# Patient Record
Sex: Male | Born: 1935 | Race: White | Hispanic: No | Marital: Married | Smoking: Former smoker
Health system: Southern US, Community
[De-identification: ages and names within clinical notes are randomized; demographics above are authoritative.]

## PROBLEM LIST (undated history)

## (undated) DIAGNOSIS — D649 Anemia, unspecified: Secondary | ICD-10-CM

## (undated) DIAGNOSIS — G629 Polyneuropathy, unspecified: Secondary | ICD-10-CM

## (undated) DIAGNOSIS — C189 Malignant neoplasm of colon, unspecified: Secondary | ICD-10-CM

## (undated) DIAGNOSIS — K219 Gastro-esophageal reflux disease without esophagitis: Secondary | ICD-10-CM

## (undated) DIAGNOSIS — N189 Chronic kidney disease, unspecified: Secondary | ICD-10-CM

## (undated) DIAGNOSIS — Z8489 Family history of other specified conditions: Secondary | ICD-10-CM

## (undated) DIAGNOSIS — E119 Type 2 diabetes mellitus without complications: Secondary | ICD-10-CM

## (undated) DIAGNOSIS — D509 Iron deficiency anemia, unspecified: Secondary | ICD-10-CM

## (undated) DIAGNOSIS — I10 Essential (primary) hypertension: Secondary | ICD-10-CM

## (undated) DIAGNOSIS — Z8719 Personal history of other diseases of the digestive system: Secondary | ICD-10-CM

## (undated) DIAGNOSIS — R7303 Prediabetes: Secondary | ICD-10-CM

## (undated) DIAGNOSIS — E039 Hypothyroidism, unspecified: Secondary | ICD-10-CM

## (undated) DIAGNOSIS — M1990 Unspecified osteoarthritis, unspecified site: Secondary | ICD-10-CM

## (undated) HISTORY — PX: UPPER GASTROINTESTINAL ENDOSCOPY: SHX188

## (undated) HISTORY — DX: Chronic kidney disease, unspecified: N18.9

## (undated) HISTORY — DX: Anemia, unspecified: D64.9

## (undated) HISTORY — PX: NO PAST SURGERIES: SHX2092

## (undated) HISTORY — PX: CATARACT EXTRACTION: SUR2

## (undated) HISTORY — DX: Gastro-esophageal reflux disease without esophagitis: K21.9

---

## 2005-03-16 ENCOUNTER — Ambulatory Visit: Payer: Self-pay | Admitting: Gastroenterology

## 2005-03-29 ENCOUNTER — Ambulatory Visit: Payer: Self-pay | Admitting: Gastroenterology

## 2008-06-14 ENCOUNTER — Encounter: Admission: RE | Admit: 2008-06-14 | Discharge: 2008-06-14 | Payer: Self-pay | Admitting: Endocrinology

## 2008-06-21 ENCOUNTER — Encounter: Admission: RE | Admit: 2008-06-21 | Discharge: 2008-06-21 | Payer: Self-pay | Admitting: Endocrinology

## 2008-07-01 ENCOUNTER — Ambulatory Visit: Admission: RE | Admit: 2008-07-01 | Discharge: 2008-07-01 | Payer: Self-pay | Admitting: Orthopedic Surgery

## 2008-07-01 ENCOUNTER — Ambulatory Visit: Payer: Self-pay | Admitting: Vascular Surgery

## 2008-07-01 ENCOUNTER — Encounter (INDEPENDENT_AMBULATORY_CARE_PROVIDER_SITE_OTHER): Payer: Self-pay | Admitting: Orthopedic Surgery

## 2008-10-06 ENCOUNTER — Observation Stay (HOSPITAL_COMMUNITY): Admission: EM | Admit: 2008-10-06 | Discharge: 2008-10-07 | Payer: Self-pay | Admitting: Emergency Medicine

## 2008-10-07 ENCOUNTER — Ambulatory Visit: Payer: Self-pay | Admitting: *Deleted

## 2008-10-07 ENCOUNTER — Encounter (INDEPENDENT_AMBULATORY_CARE_PROVIDER_SITE_OTHER): Payer: Self-pay | Admitting: Internal Medicine

## 2010-04-02 ENCOUNTER — Encounter: Payer: Self-pay | Admitting: Gastroenterology

## 2010-04-09 ENCOUNTER — Encounter (INDEPENDENT_AMBULATORY_CARE_PROVIDER_SITE_OTHER): Payer: Self-pay | Admitting: *Deleted

## 2010-04-23 NOTE — Letter (Signed)
Summary: Pre Visit Letter Revised  [facility]  [address] [place], [place] [address]   Phone: [phone]  Fax: [phone]        04/09/2010 MRN: [id number] [facility] [address] [place], [place]  [address]             Procedure Date:  05/14/2010 @ 8:00   Recall colon-Dr. Arlyce Dice   Welcome to the Gastroenterology Division at [facility].    You are scheduled to see a nurse for your pre-procedure visit on 04/30/2010 at 8:30 on the 3rd floor at [facility], [address]  We ask that you try to arrive at our office 15 minutes prior to your appointment time to allow for check-in.  Please take a minute to review the attached form.  If you answer "Yes" to one or more of the questions on the first page, we ask that you call the person listed at your earliest opportunity.  If you answer "No" to all of the questions, please complete the rest of the form and bring it to your appointment.    Your nurse visit will consist of discussing your medical and surgical history, your immediate family medical history, and your medications.   If you are unable to list all of your medications on the form, please bring the medication bottles to your appointment and we will list them.  We will need to be aware of both prescribed and over the counter drugs.  We will need to know exact dosage information as well.    Please be prepared to read and sign documents such as consent forms, a financial agreement, and acknowledgement forms.  If necessary, and with your consent, a friend or relative is welcome to sit-in on the nurse visit with you.  Please bring your insurance card so that we may make a copy of it.  If your insurance requires a referral to see a specialist, please bring your referral form from your primary care physician.  No co-pay is required for this nurse visit.     If you cannot keep your appointment, please call [phone] to cancel or reschedule prior to  your appointment date.  This allows [place] the opportunity to schedule an appointment for another patient in need of care.    Thank you for choosing [facility] for your medical needs.  We appreciate the opportunity to care for you.  Please visit [place] at our website  to learn more about our practice.  Sincerely, The Gastroenterology Division

## 2010-04-23 NOTE — Letter (Signed)
Summary: Colonoscopy Letter  [facility]  [address] [place], [place] [address]   Phone: [phone]  Fax: [phone]      April 02, 2010 MRN: [id number]   Joseph Pennington [address] [place], [place]  [address]   Dear Joseph Pennington,   According to your medical record, it is time for you to schedule a Colonoscopy. The American Cancer Society recommends this procedure as a method to detect early colon cancer. Patients with a family history of colon cancer, or a personal history of colon polyps or inflammatory bowel disease are at increased risk.  This letter has been generated based on the recommendations made at the time of your procedure. If you feel that in your particular situation this may no longer apply, please contact our office.  Please call our office at [phone] to schedule this appointment or to update your records at your earliest convenience.  Thank you for cooperating with [place] to provide you with the very best care possible.   Sincerely,  Joseph Pennington, M.D.  [facility] Gastroenterology Division [phone]

## 2010-04-29 ENCOUNTER — Encounter (INDEPENDENT_AMBULATORY_CARE_PROVIDER_SITE_OTHER): Payer: Self-pay

## 2010-04-30 ENCOUNTER — Encounter: Payer: Self-pay | Admitting: Gastroenterology

## 2010-05-07 NOTE — Miscellaneous (Signed)
Summary: [facility]  Clinical Lists Changes  Medications: Added new medication of MOVIPREP 100 GM  SOLR (PEG-KCL-NACL-NASULF-NA ASC-C) As per prep instructions. - Signed Rx of MOVIPREP 100 GM  SOLR (PEG-KCL-NACL-NASULF-NA ASC-C) As per prep instructions.;  #1 x 0;  Signed;  Entered by: Doristine Church RN II;  Authorized by: Louis Meckel MD;  Method used: Electronically to [facility], [place], [place], [place]  [address], Ph: [phone], Fax: [phone] Observations: Added new observation of ALLERGY REV: Done (04/30/2010 8:18) Added new observation of NKA: T (04/30/2010 8:18)    Prescriptions: MOVIPREP 100 GM  SOLR (PEG-KCL-NACL-NASULF-NA ASC-C) As per prep instructions.  #1 x 0   Entered by:   Doristine Church RN II   Authorized by:   Louis Meckel MD   Signed by:   Doristine Church RN II on 04/30/2010   Method used:   Electronically to        [facility] (retail)       [address]       [place], [place]  [address]       Ph: [phone]       Fax: [phone]   RxID:   [id number]

## 2010-05-07 NOTE — Letter (Signed)
Summary: [facility] Instructions  [facility]  [address] [place], [place] [address]   Phone: [phone]  Fax: [phone]       Joseph Pennington    1931-03-29    MRN: [id number]        Procedure Day Dorna Bloom:  Lenor Coffin  05/14/10     Arrival Time:  7:30AM     Procedure Time:  8:00AM     Location of Procedure:                    Juliann Pares _  [facility] (4th Floor)                      PREPARATION FOR COLONOSCOPY WITH MOVIPREP   Starting 5 days prior to your procedure 05/09/10 do not eat nuts, seeds, popcorn, corn, beans, peas,  salads, or any raw vegetables.  Do not take any fiber supplements (e.g. Metamucil, Citrucel, and Benefiber).  THE DAY BEFORE YOUR PROCEDURE         DATE: 05/13/10  DAY: WEDNESDAY  1.  Drink clear liquids the entire day-NO SOLID FOOD  2.  Do not drink anything colored red or purple.  Avoid juices with pulp.  No orange juice.  3.  Drink at least 64 oz. (8 glasses) of fluid/clear liquids during the day to prevent dehydration and help the prep work efficiently.  CLEAR LIQUIDS INCLUDE: Water Jello Ice Popsicles Tea (sugar ok, no milk/cream) Powdered fruit flavored drinks Coffee (sugar ok, no milk/cream) Gatorade Juice: apple, white grape, white cranberry  Lemonade Clear bullion, consomm, broth Carbonated beverages (any kind) Strained chicken noodle soup Hard Candy                             4.  In the morning, mix first dose of MoviPrep solution:    Empty 1 Pouch A and 1 Pouch B into the disposable container    Add lukewarm drinking water to the top line of the container. Mix to dissolve    Refrigerate (mixed solution should be used within 24 hrs)  5.  Begin drinking the prep at 5:00 p.m. The MoviPrep container is divided by 4 marks.   Every 15 minutes drink the solution down to the next mark (approximately 8 oz) until the full liter is complete.   6.  Follow completed prep with 16 oz of clear liquid of your choice  (Nothing red or purple).  Continue to drink clear liquids until bedtime.  7.  Before going to bed, mix second dose of MoviPrep solution:    Empty 1 Pouch A and 1 Pouch B into the disposable container    Add lukewarm drinking water to the top line of the container. Mix to dissolve    Refrigerate  THE DAY OF YOUR PROCEDURE      DATE: 05/14/10   DAY: THURSDAY  Beginning at 3:00AM (5 hours before procedure):         1. Every 15 minutes, drink the solution down to the next mark (approx 8 oz) until the full liter is complete.  2. Follow completed prep with 16 oz. of clear liquid of your choice.    3. You may drink clear liquids until 6:00AM (2 HOURS BEFORE PROCEDURE).   MEDICATION INSTRUCTIONS  Unless otherwise instructed, you should take regular prescription medications with a small sip of water   as early as possible the morning of  your procedure.        OTHER INSTRUCTIONS  You will need a responsible adult at least 75 years of age to accompany you and drive you home.   This person must remain in the waiting room during your procedure.  Wear loose fitting clothing that is easily removed.  Leave jewelry and other valuables at home.  However, you may wish to bring a book to read or  an iPod/MP3 player to listen to music as you wait for your procedure to start.  Remove all body piercing jewelry and leave at home.  Total time from sign-in until discharge is approximately 2-3 hours.  You should go home directly after your procedure and rest.  You can resume normal activities the  day after your procedure.  The day of your procedure you should not:   Drive   Make legal decisions   Operate machinery   Drink alcohol   Return to work  You will receive specific instructions about eating, activities and medications before you leave.    The above instructions have been reviewed and explained to me by   Doristine Church RN II  April 30, 2010 8:57 AM     I fully  understand and can verbalize these instructions _____________________________ Date _________

## 2010-05-14 ENCOUNTER — Other Ambulatory Visit: Payer: Self-pay | Admitting: Gastroenterology

## 2010-05-14 ENCOUNTER — Other Ambulatory Visit (AMBULATORY_SURGERY_CENTER): Payer: Medicare Other | Admitting: Gastroenterology

## 2010-05-14 DIAGNOSIS — Z1211 Encounter for screening for malignant neoplasm of colon: Secondary | ICD-10-CM

## 2010-05-14 DIAGNOSIS — D126 Benign neoplasm of colon, unspecified: Secondary | ICD-10-CM

## 2010-05-14 DIAGNOSIS — Z86010 Personal history of colonic polyps: Secondary | ICD-10-CM

## 2010-05-19 ENCOUNTER — Encounter: Payer: Self-pay | Admitting: Gastroenterology

## 2010-05-19 NOTE — Procedures (Addendum)
Summary: Colonoscopy  Patient: Joseph Pennington Note: All result statuses are Final unless otherwise noted.  Tests: (1) Colonoscopy (COL)   COL Colonoscopy           DONE     [facility]     [address]     [place], [place]  [address]           COLONOSCOPY PROCEDURE REPORT           PATIENT:  [name], [name]  MR#:  [id number]     BIRTHDATE:  [birth year], 78 yrs. old  GENDER:  male           ENDOSCOPIST:  Barbette Hair. Arlyce Dice, MD     Referred by:  Holley Bouche, M.D.           PROCEDURE DATE:  05/14/2010     PROCEDURE:  Colonoscopy with snare polypectomy     ASA CLASS:  Class II     INDICATIONS:  1) screening  2) history of pre-cancerous     (adenomatous) colon polyps Index polypectomy 2004; colo 2007     normal           MEDICATIONS:   Fentanyl 50 mcg IV, Versed 5 mg IV           DESCRIPTION OF PROCEDURE:   After the risks benefits and     alternatives of the procedure were thoroughly explained, informed     consent was obtained.  Digital rectal exam was performed and     revealed no abnormalities.   The LB CF-[id number] endoscope     was introduced through the anus and advanced to the cecum, which     was identified by the ileocecal valve, without limitations.  The     quality of the prep was excellent, using MoviPrep.  The instrument     was then slowly withdrawn as the colon was fully examined.     <<PROCEDUREIMAGES>>           FINDINGS:  A sessile polyp was found in the descending colon. It     was 4 mm in size. Polyp was snared without cautery. Retrieval was     successful (see image5). snare polyp    (see image7). 2 2-22mm     polyps at 15 and 20cm from anus - removed with cold polypectomy     snare  This was otherwise a normal examination of the colon (see     image1, image2, image3, and image8).   Retroflexed views in the     rectum revealed no abnormalities.    The time to cecum =  2.25     minutes. The scope was then withdrawn (time =  8.75  min) from  the     patient and the procedure completed.           COMPLICATIONS:  None           ENDOSCOPIC IMPRESSION:     1) 4 mm sessile polyp in the descending colon     2) Two polyps in sigmoid     3) Otherwise normal examination     RECOMMENDATIONS:     1) If the polyp(s) removed today are proven to be adenomatous     (pre-cancerous) polyps, you will need a repeat colonoscopy in 5     years. Otherwise you should continue to follow colorectal cancer     screening guidelines for "routine risk" patients with colonoscopy  in 10 years.           REPEAT EXAM:   You will receive a letter from Dr. Arlyce Dice in 1-2     weeks, after reviewing the final pathology, with followup     recommendations.           ______________________________     Barbette Hair Arlyce Dice, MD           CC:           n.     eSIGNED:   Barbette Hair. Mckinleigh Schuchart at 05/14/2010 08:34 AM           Angelyn Punt, [id number]  Note: An exclamation mark (!) indicates a result that was not dispersed into the flowsheet. Document Creation Date: 05/14/2010 8:34 AM _______________________________________________________________________  (1) Order result status: Final Collection or observation date-time: 05/14/2010 08:25 Requested date-time:  Receipt date-time:  Reported date-time:  Referring Physician:   Ordering Physician: Melvia Heaps [phone]) Specimen Source:  Source: Launa Grill Order Number: [id number] Lab site:   Appended Document: Colonoscopy     Procedures Next Due Date:    Colonoscopy: 04/2020

## 2010-05-28 NOTE — Letter (Signed)
Summary: Patient Notice-Hyperplastic Polyps  [facility]  [address] [place], [place] [address]   Phone: [phone]  Fax: [phone]        May 19, 2010 MRN: [id number]    Joseph Pennington [address] [place], [place]  [address]    Dear Joseph Pennington,  I am pleased to inform you that the colon polyp(s) removed during your recent colonoscopy was (were) found to be hyperplastic.  These types of polyps are NOT pre-cancerous.  It is therefore my recommendation that you have a repeat colonoscopy examination in 10_ years for routine colorectal cancer screening.  Should you develop new or worsening symptoms of abdominal pain, bowel habit changes or bleeding from the rectum or bowels, please schedule an evaluation with either your primary care physician or with me.  Additional information/recommendations:  __No further action with gastroenterology is needed at this time.      Please follow-up with your primary care physician for your other      healthcare needs. __Please call [phone] to schedule a return visit to review      your situation.  __Please keep your follow-up visit as already scheduled.  _x_Continue treatment plan as outlined the day of your exam.  Please call us if you are having persistent problems or have questions about your condition that have not been fully answered at this time.  Sincerely,  Louis Meckel MD This letter has been electronically signed by your physician.  Appended Document: Patient Notice-Hyperplastic Polyps letter mailed

## 2010-06-28 LAB — BASIC METABOLIC PANEL
BUN: 15 mg/dL (ref 6–23)
CO2: 26 mEq/L (ref 19–32)
Calcium: 9.2 mg/dL (ref 8.4–10.5)
Chloride: 109 mEq/L (ref 96–112)
Creatinine, Ser: 1.2 mg/dL (ref 0.4–1.5)
GFR calc Af Amer: >60 mL/min (ref >60)
GFR calc non Af Amer: 59 mL/min — ABNORMAL LOW (ref >60)
Glucose, Bld: 126 mg/dL — ABNORMAL HIGH (ref 70–99)
Potassium: 4.4 mEq/L (ref 3.5–5.1)
Sodium: 141 mEq/L (ref 135–145)

## 2010-06-28 LAB — CBC
HCT: 39.7 % (ref 39.0–52.0)
HCT: 41.8 % (ref 39.0–52.0)
Hemoglobin: 14 g/dL (ref 13.0–17.0)
Hemoglobin: 14.9 g/dL (ref 13.0–17.0)
MCHC: 35.3 g/dL (ref 30.0–36.0)
MCHC: 35.5 g/dL (ref 30.0–36.0)
MCV: 93.5 fL (ref 78.0–100.0)
MCV: 93.6 fL (ref 78.0–100.0)
Platelets: 146 10*3/uL — ABNORMAL LOW (ref 150–400)
Platelets: 147 10*3/uL — ABNORMAL LOW (ref 150–400)
RBC: 4.24 MIL/uL (ref 4.22–5.81)
RBC: 4.47 MIL/uL (ref 4.22–5.81)
RDW: 12.5 % (ref 11.5–15.5)
RDW: 12.9 % (ref 11.5–15.5)
WBC: 6 10*3/uL (ref 4.0–10.5)
WBC: 8.1 10*3/uL (ref 4.0–10.5)

## 2010-06-28 LAB — URINALYSIS, ROUTINE W REFLEX MICROSCOPIC
Bilirubin Urine: NEGATIVE
Glucose, UA: NEGATIVE mg/dL
Hgb urine dipstick: NEGATIVE
Ketones, ur: NEGATIVE mg/dL
Nitrite: NEGATIVE
Protein, ur: NEGATIVE mg/dL
Specific Gravity, Urine: 1.021 (ref 1.005–1.030)
Urobilinogen, UA: 1 mg/dL (ref 0.0–1.0)
pH: 7.5 (ref 5.0–8.0)

## 2010-06-28 LAB — COMPREHENSIVE METABOLIC PANEL
ALT: 40 U/L (ref 0–53)
AST: 35 U/L (ref 0–37)
Albumin: 3.9 g/dL (ref 3.5–5.2)
Alkaline Phosphatase: 78 U/L (ref 39–117)
BUN: 19 mg/dL (ref 6–23)
CO2: 26 mEq/L (ref 19–32)
Calcium: 9.4 mg/dL (ref 8.4–10.5)
Chloride: 104 mEq/L (ref 96–112)
Creatinine, Ser: 1.14 mg/dL (ref 0.4–1.5)
GFR calc Af Amer: >60 mL/min (ref >60)
GFR calc non Af Amer: >60 mL/min (ref >60)
Glucose, Bld: 132 mg/dL — ABNORMAL HIGH (ref 70–99)
Potassium: 4.5 mEq/L (ref 3.5–5.1)
Sodium: 135 mEq/L (ref 135–145)
Total Bilirubin: 0.9 mg/dL (ref 0.3–1.2)
Total Protein: 7.1 g/dL (ref 6.0–8.3)

## 2010-06-28 LAB — CARDIAC PANEL(CRET KIN+CKTOT+MB+TROPI)
CK, MB: 2 ng/mL (ref 0.3–4.0)
CK, MB: 2.3 ng/mL (ref 0.3–4.0)
Relative Index: INVALID (ref 0.0–2.5)
Relative Index: INVALID (ref 0.0–2.5)
Total CK: 91 U/L (ref 7–232)
Total CK: 95 U/L (ref 7–232)
Troponin I: <0.01 ng/mL (ref 0.00–0.06)
Troponin I: <0.01 ng/mL (ref 0.00–0.06)

## 2010-06-28 LAB — POCT CARDIAC MARKERS
CKMB, poc: <1 ng/mL — ABNORMAL LOW (ref 1.0–8.0)
Myoglobin, poc: 100 ng/mL (ref 12–200)
Troponin i, poc: <0.05 ng/mL (ref 0.00–0.09)

## 2010-06-28 LAB — DIFFERENTIAL
Basophils Absolute: 0 10*3/uL (ref 0.0–0.1)
Basophils Relative: 0 % (ref 0–1)
Eosinophils Absolute: 0 10*3/uL (ref 0.0–0.7)
Eosinophils Relative: 1 % (ref 0–5)
Lymphocytes Relative: 13 % (ref 12–46)
Lymphs Abs: 1 10*3/uL (ref 0.7–4.0)
Monocytes Absolute: 0.3 10*3/uL (ref 0.1–1.0)
Monocytes Relative: 4 % (ref 3–12)
Neutro Abs: 6.7 10*3/uL (ref 1.7–7.7)
Neutrophils Relative %: 83 % — ABNORMAL HIGH (ref 43–77)

## 2010-06-28 LAB — CK TOTAL AND CKMB (NOT AT ARMC)
CK, MB: 2.5 ng/mL (ref 0.3–4.0)
Relative Index: 2.4 (ref 0.0–2.5)
Total CK: 103 U/L (ref 7–232)

## 2010-06-28 LAB — TROPONIN I: Troponin I: 0.01 ng/mL (ref 0.00–0.06)

## 2010-08-04 NOTE — H&P (Signed)
NAMEMarland Pennington  LERRY, CORDREY NO.:  [id number]   MEDICAL RECORD NO.:  [id number]          PATIENT TYPE:  INP   LOCATION:  5506                         FACILITY:  [facility]   PHYSICIAN:  Lonia Blood, M.D.       DATE OF BIRTH:  [birth year]   DATE OF ADMISSION:  10/06/2008  DATE OF DISCHARGE:                              HISTORY & PHYSICAL   PRIMARY CARE PHYSICIAN:  Alfonse Alpers. Dagoberto Ligas, MD   CHIEF COMPLAINT:  Passed out.   HISTORY OF PRESENT ILLNESS:  Mr. Joseph Pennington is a 77 year old gentleman with  a history of hypertension who was brought into the emergency room today  after he had an episode of syncope at home.  [name] said that he  has been feeling sort of sick with cold for the past day, eating less  and drinking less fluids.  He also did not have his breakfast this  morning before walking the dog.  He went outside and was staring at the  tree and then when he tried to look down, he went out without being able  to control his fall.  He fell against a brick wall of the house scraping  the skin off of his right ear and right arm.  He went down to the ground  and he said that he probably did not lose consciousness and got up  quickly and came inside the house.  Inside the house he remained  significantly dizzy and unable to function so they summoned up EMS who  found him to be bradycardic into the 50s.  EMS medicated him with  atropine which increased his heart rate.  As he remained significantly  dizzy, he was brought to the emergency room.  In the emergency room, he  was denying any chest pain, vertigo, nausea, vomiting, abdominal pain,  and shortness of breath.   PAST MEDICAL HISTORY:  1. 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  2. Hypertension.  3. Ulnar neuropathy.  4. Impaired glucose tolerance.   HOME MEDICATIONS:  1. Lisinopril 10 mg daily.  2. Atenolol 25 mg daily.  3. Vitamin B12 injection monthly.  4. Vitamin B1 100 mg  daily.  5. Folic acid 1 mg daily.  6. Aspirin 81 mg daily.   SOCIAL HISTORY:  The patient is married, has a remote history of tobacco  use.  He quit in 1980 and prior to that smoked a pack of cigarettes a  day since age 8.  There is no history of alcohol or drugs.   FAMILY HISTORY:  Mother died at age 75 and she had open heart surgery in  her 45s and a brain aneurysm resulting in subarachnoid hemorrhage 2  years before she passed away.  The patient's father died at age 54 with  a massive heart attack.   REVIEW OF SYSTEMS:  As per HPI.  All other systems reviewed and  negative.   PHYSICAL EXAMINATION:  VITAL SIGNS:  Upon admission temperature is 97.5,  heart rate 62, respiratory rate 16, blood pressure 146/85, saturation  100% on room air.  GENERAL:  The patient is a tall, well-built, well-developed and well-  nourished gentleman in no acute distress sitting on the stretcher.  His  right ear is bandaged heavily so I cannot examine it but the patient  reports that the skin is scraped off of it and it is currently not  hurting him.  THROAT:  Clear.  NECK:  Supple.  No JVD.  No carotid bruits.  CHEST:  Clear to auscultation.  No wheezes, rhonchi or crackles.  HEART:  Regular rate and rhythm without murmurs, rubs or gallops.  ABDOMEN:  Soft, nontender, nondistended, bowel sounds are present.  EXTREMITIES:  Lower extremity without edema.  The skin on the right  forearm there is some minor skin tears and excoriations from the fall.  Otherwise, the skin seems to be intact and without suspicious rashes.  NEUROLOGIC:  Cranial nerves II-XII intact, strength 5/5 in all four  extremities.  Sensation intact.   LABORATORY VALUES:  Upon admission white blood cell count is 8.1,  hemoglobin 14.9, platelet count is 146, myoglobin 100, troponin I less  than 0.05.  Sodium 135, potassium 4.5, chloride 104, bicarbonate 26, BUN  19, creatinine 1.1, calcium 9.4, protein 7.1, albumin 3.9, AST 35, ALT   40.  Urinalysis within normal limits.  Chest x-ray PA and lateral no  evidence of acute cardiopulmonary disease.  Head CT no evidence of acute  intracranial abnormality.   ASSESSMENT AND PLAN:  This is a 77 year old gentleman placed on  observation for a probable syncope versus near syncope.  The description  of the event and also the finding of bradycardia upon EMS arrival raises  the possibility of a vasovagal event.  The patient's orthostatics in the  emergency room do not indicate the patient is suffering from orthostatic  hypotension.  My plan is to place [name] on telemetry, check  cardiac enzymes, check transthoracic echocardiogram, carotid ultrasound  and discontinue the atenolol.  The patient will receive intravenous  fluids and further plans of care will be dictated by his course in the  first 23 hours of observation.      Lonia Blood, M.D.  Electronically Signed     SL/MEDQ  D:  10/06/2008  T:  10/07/2008  Job:  [id number]   cc:   Alfonse Alpers. Dagoberto Ligas, M.D.

## 2011-05-14 DIAGNOSIS — H40019 Open angle with borderline findings, low risk, unspecified eye: Secondary | ICD-10-CM | POA: Diagnosis not present

## 2011-07-12 DIAGNOSIS — Z125 Encounter for screening for malignant neoplasm of prostate: Secondary | ICD-10-CM | POA: Diagnosis not present

## 2011-07-12 DIAGNOSIS — R7309 Other abnormal glucose: Secondary | ICD-10-CM | POA: Diagnosis not present

## 2011-07-12 DIAGNOSIS — N183 Chronic kidney disease, stage 3 unspecified: Secondary | ICD-10-CM | POA: Diagnosis not present

## 2011-07-12 DIAGNOSIS — I10 Essential (primary) hypertension: Secondary | ICD-10-CM | POA: Diagnosis not present

## 2011-08-23 DIAGNOSIS — D492 Neoplasm of unspecified behavior of bone, soft tissue, and skin: Secondary | ICD-10-CM | POA: Diagnosis not present

## 2011-08-23 DIAGNOSIS — H40019 Open angle with borderline findings, low risk, unspecified eye: Secondary | ICD-10-CM | POA: Diagnosis not present

## 2011-09-29 DIAGNOSIS — H43399 Other vitreous opacities, unspecified eye: Secondary | ICD-10-CM | POA: Diagnosis not present

## 2011-09-29 DIAGNOSIS — H5210 Myopia, unspecified eye: Secondary | ICD-10-CM | POA: Diagnosis not present

## 2011-09-29 DIAGNOSIS — H524 Presbyopia: Secondary | ICD-10-CM | POA: Diagnosis not present

## 2011-09-29 DIAGNOSIS — IMO0001 Reserved for inherently not codable concepts without codable children: Secondary | ICD-10-CM | POA: Diagnosis not present

## 2011-09-29 DIAGNOSIS — H40019 Open angle with borderline findings, low risk, unspecified eye: Secondary | ICD-10-CM | POA: Diagnosis not present

## 2011-12-24 DIAGNOSIS — H40019 Open angle with borderline findings, low risk, unspecified eye: Secondary | ICD-10-CM | POA: Diagnosis not present

## 2011-12-24 DIAGNOSIS — D492 Neoplasm of unspecified behavior of bone, soft tissue, and skin: Secondary | ICD-10-CM | POA: Diagnosis not present

## 2012-01-06 DIAGNOSIS — Z23 Encounter for immunization: Secondary | ICD-10-CM | POA: Diagnosis not present

## 2012-01-11 DIAGNOSIS — R7309 Other abnormal glucose: Secondary | ICD-10-CM | POA: Diagnosis not present

## 2012-01-11 DIAGNOSIS — I10 Essential (primary) hypertension: Secondary | ICD-10-CM | POA: Diagnosis not present

## 2012-01-11 DIAGNOSIS — N183 Chronic kidney disease, stage 3 unspecified: Secondary | ICD-10-CM | POA: Diagnosis not present

## 2012-07-13 DIAGNOSIS — I10 Essential (primary) hypertension: Secondary | ICD-10-CM | POA: Diagnosis not present

## 2012-07-13 DIAGNOSIS — N183 Chronic kidney disease, stage 3 unspecified: Secondary | ICD-10-CM | POA: Diagnosis not present

## 2012-07-13 DIAGNOSIS — Z0000 Encounter for general adult medical examination without abnormal findings: Secondary | ICD-10-CM | POA: Diagnosis not present

## 2012-07-13 DIAGNOSIS — Z1211 Encounter for screening for malignant neoplasm of colon: Secondary | ICD-10-CM | POA: Diagnosis not present

## 2012-07-13 DIAGNOSIS — Z125 Encounter for screening for malignant neoplasm of prostate: Secondary | ICD-10-CM | POA: Diagnosis not present

## 2012-07-13 DIAGNOSIS — Z23 Encounter for immunization: Secondary | ICD-10-CM | POA: Diagnosis not present

## 2012-07-13 DIAGNOSIS — R7309 Other abnormal glucose: Secondary | ICD-10-CM | POA: Diagnosis not present

## 2012-07-13 DIAGNOSIS — Z1331 Encounter for screening for depression: Secondary | ICD-10-CM | POA: Diagnosis not present

## 2012-11-28 DIAGNOSIS — H43819 Vitreous degeneration, unspecified eye: Secondary | ICD-10-CM | POA: Diagnosis not present

## 2012-11-28 DIAGNOSIS — H43399 Other vitreous opacities, unspecified eye: Secondary | ICD-10-CM | POA: Diagnosis not present

## 2012-11-28 DIAGNOSIS — H2510 Age-related nuclear cataract, unspecified eye: Secondary | ICD-10-CM | POA: Diagnosis not present

## 2012-11-28 DIAGNOSIS — H40019 Open angle with borderline findings, low risk, unspecified eye: Secondary | ICD-10-CM | POA: Diagnosis not present

## 2012-11-28 DIAGNOSIS — H01009 Unspecified blepharitis unspecified eye, unspecified eyelid: Secondary | ICD-10-CM | POA: Diagnosis not present

## 2012-11-28 DIAGNOSIS — D492 Neoplasm of unspecified behavior of bone, soft tissue, and skin: Secondary | ICD-10-CM | POA: Diagnosis not present

## 2012-11-28 DIAGNOSIS — E119 Type 2 diabetes mellitus without complications: Secondary | ICD-10-CM | POA: Diagnosis not present

## 2013-01-02 DIAGNOSIS — Z23 Encounter for immunization: Secondary | ICD-10-CM | POA: Diagnosis not present

## 2013-01-12 DIAGNOSIS — I10 Essential (primary) hypertension: Secondary | ICD-10-CM | POA: Diagnosis not present

## 2013-01-12 DIAGNOSIS — E538 Deficiency of other specified B group vitamins: Secondary | ICD-10-CM | POA: Diagnosis not present

## 2013-01-12 DIAGNOSIS — R7309 Other abnormal glucose: Secondary | ICD-10-CM | POA: Diagnosis not present

## 2013-01-12 DIAGNOSIS — N183 Chronic kidney disease, stage 3 unspecified: Secondary | ICD-10-CM | POA: Diagnosis not present

## 2013-07-13 DIAGNOSIS — N183 Chronic kidney disease, stage 3 unspecified: Secondary | ICD-10-CM | POA: Diagnosis not present

## 2013-07-13 DIAGNOSIS — Z125 Encounter for screening for malignant neoplasm of prostate: Secondary | ICD-10-CM | POA: Diagnosis not present

## 2013-07-13 DIAGNOSIS — Z23 Encounter for immunization: Secondary | ICD-10-CM | POA: Diagnosis not present

## 2013-07-13 DIAGNOSIS — I10 Essential (primary) hypertension: Secondary | ICD-10-CM | POA: Diagnosis not present

## 2013-07-13 DIAGNOSIS — Z0000 Encounter for general adult medical examination without abnormal findings: Secondary | ICD-10-CM | POA: Diagnosis not present

## 2013-07-13 DIAGNOSIS — R7309 Other abnormal glucose: Secondary | ICD-10-CM | POA: Diagnosis not present

## 2014-01-08 DIAGNOSIS — Z23 Encounter for immunization: Secondary | ICD-10-CM | POA: Diagnosis not present

## 2014-01-14 DIAGNOSIS — I10 Essential (primary) hypertension: Secondary | ICD-10-CM | POA: Diagnosis not present

## 2014-01-14 DIAGNOSIS — N183 Chronic kidney disease, stage 3 (moderate): Secondary | ICD-10-CM | POA: Diagnosis not present

## 2014-01-14 DIAGNOSIS — R739 Hyperglycemia, unspecified: Secondary | ICD-10-CM | POA: Diagnosis not present

## 2014-01-14 DIAGNOSIS — E538 Deficiency of other specified B group vitamins: Secondary | ICD-10-CM | POA: Diagnosis not present

## 2014-07-16 DIAGNOSIS — R739 Hyperglycemia, unspecified: Secondary | ICD-10-CM | POA: Diagnosis not present

## 2014-07-16 DIAGNOSIS — W57XXXA Bitten or stung by nonvenomous insect and other nonvenomous arthropods, initial encounter: Secondary | ICD-10-CM | POA: Diagnosis not present

## 2014-07-16 DIAGNOSIS — N183 Chronic kidney disease, stage 3 (moderate): Secondary | ICD-10-CM | POA: Diagnosis not present

## 2014-07-16 DIAGNOSIS — H8113 Benign paroxysmal vertigo, bilateral: Secondary | ICD-10-CM | POA: Diagnosis not present

## 2014-07-16 DIAGNOSIS — I10 Essential (primary) hypertension: Secondary | ICD-10-CM | POA: Diagnosis not present

## 2014-12-27 DIAGNOSIS — Z23 Encounter for immunization: Secondary | ICD-10-CM | POA: Diagnosis not present

## 2015-01-16 DIAGNOSIS — I10 Essential (primary) hypertension: Secondary | ICD-10-CM | POA: Diagnosis not present

## 2015-01-16 DIAGNOSIS — R739 Hyperglycemia, unspecified: Secondary | ICD-10-CM | POA: Diagnosis not present

## 2015-01-16 DIAGNOSIS — N183 Chronic kidney disease, stage 3 (moderate): Secondary | ICD-10-CM | POA: Diagnosis not present

## 2015-01-30 DIAGNOSIS — H2513 Age-related nuclear cataract, bilateral: Secondary | ICD-10-CM | POA: Diagnosis not present

## 2015-01-30 DIAGNOSIS — H40013 Open angle with borderline findings, low risk, bilateral: Secondary | ICD-10-CM | POA: Diagnosis not present

## 2015-01-30 DIAGNOSIS — E119 Type 2 diabetes mellitus without complications: Secondary | ICD-10-CM | POA: Diagnosis not present

## 2015-01-30 DIAGNOSIS — H01003 Unspecified blepharitis right eye, unspecified eyelid: Secondary | ICD-10-CM | POA: Diagnosis not present

## 2015-05-07 DIAGNOSIS — H40013 Open angle with borderline findings, low risk, bilateral: Secondary | ICD-10-CM | POA: Diagnosis not present

## 2015-05-07 DIAGNOSIS — H16143 Punctate keratitis, bilateral: Secondary | ICD-10-CM | POA: Diagnosis not present

## 2015-07-17 DIAGNOSIS — I10 Essential (primary) hypertension: Secondary | ICD-10-CM | POA: Diagnosis not present

## 2016-01-02 DIAGNOSIS — I10 Essential (primary) hypertension: Secondary | ICD-10-CM | POA: Diagnosis not present

## 2016-01-02 DIAGNOSIS — Z1211 Encounter for screening for malignant neoplasm of colon: Secondary | ICD-10-CM | POA: Diagnosis not present

## 2016-01-02 DIAGNOSIS — Z0000 Encounter for general adult medical examination without abnormal findings: Secondary | ICD-10-CM | POA: Diagnosis not present

## 2016-01-02 DIAGNOSIS — N183 Chronic kidney disease, stage 3 (moderate): Secondary | ICD-10-CM | POA: Diagnosis not present

## 2016-01-02 DIAGNOSIS — Z23 Encounter for immunization: Secondary | ICD-10-CM | POA: Diagnosis not present

## 2016-01-02 DIAGNOSIS — R7303 Prediabetes: Secondary | ICD-10-CM | POA: Diagnosis not present

## 2016-01-02 DIAGNOSIS — E538 Deficiency of other specified B group vitamins: Secondary | ICD-10-CM | POA: Diagnosis not present

## 2016-01-15 DIAGNOSIS — Z1211 Encounter for screening for malignant neoplasm of colon: Secondary | ICD-10-CM | POA: Diagnosis not present

## 2016-02-03 DIAGNOSIS — H25013 Cortical age-related cataract, bilateral: Secondary | ICD-10-CM | POA: Diagnosis not present

## 2016-02-03 DIAGNOSIS — H2513 Age-related nuclear cataract, bilateral: Secondary | ICD-10-CM | POA: Diagnosis not present

## 2016-02-03 DIAGNOSIS — H40013 Open angle with borderline findings, low risk, bilateral: Secondary | ICD-10-CM | POA: Diagnosis not present

## 2016-07-02 DIAGNOSIS — E538 Deficiency of other specified B group vitamins: Secondary | ICD-10-CM | POA: Diagnosis not present

## 2016-07-02 DIAGNOSIS — R7303 Prediabetes: Secondary | ICD-10-CM | POA: Diagnosis not present

## 2016-07-02 DIAGNOSIS — I10 Essential (primary) hypertension: Secondary | ICD-10-CM | POA: Diagnosis not present

## 2016-07-02 DIAGNOSIS — N183 Chronic kidney disease, stage 3 (moderate): Secondary | ICD-10-CM | POA: Diagnosis not present

## 2016-07-28 DIAGNOSIS — H40013 Open angle with borderline findings, low risk, bilateral: Secondary | ICD-10-CM | POA: Diagnosis not present

## 2017-01-03 DIAGNOSIS — E538 Deficiency of other specified B group vitamins: Secondary | ICD-10-CM | POA: Diagnosis not present

## 2017-01-03 DIAGNOSIS — I10 Essential (primary) hypertension: Secondary | ICD-10-CM | POA: Diagnosis not present

## 2017-01-03 DIAGNOSIS — R7303 Prediabetes: Secondary | ICD-10-CM | POA: Diagnosis not present

## 2017-01-03 DIAGNOSIS — Z0000 Encounter for general adult medical examination without abnormal findings: Secondary | ICD-10-CM | POA: Diagnosis not present

## 2017-01-03 DIAGNOSIS — Z23 Encounter for immunization: Secondary | ICD-10-CM | POA: Diagnosis not present

## 2017-01-03 DIAGNOSIS — N183 Chronic kidney disease, stage 3 (moderate): Secondary | ICD-10-CM | POA: Diagnosis not present

## 2017-02-07 DIAGNOSIS — H40023 Open angle with borderline findings, high risk, bilateral: Secondary | ICD-10-CM | POA: Diagnosis not present

## 2017-02-07 DIAGNOSIS — H2513 Age-related nuclear cataract, bilateral: Secondary | ICD-10-CM | POA: Diagnosis not present

## 2017-02-07 DIAGNOSIS — H40053 Ocular hypertension, bilateral: Secondary | ICD-10-CM | POA: Diagnosis not present

## 2017-02-15 DIAGNOSIS — H40051 Ocular hypertension, right eye: Secondary | ICD-10-CM | POA: Diagnosis not present

## 2017-02-15 DIAGNOSIS — H40021 Open angle with borderline findings, high risk, right eye: Secondary | ICD-10-CM | POA: Diagnosis not present

## 2017-03-01 DIAGNOSIS — H40022 Open angle with borderline findings, high risk, left eye: Secondary | ICD-10-CM | POA: Diagnosis not present

## 2017-03-01 DIAGNOSIS — H40052 Ocular hypertension, left eye: Secondary | ICD-10-CM | POA: Diagnosis not present

## 2017-04-12 DIAGNOSIS — H40053 Ocular hypertension, bilateral: Secondary | ICD-10-CM | POA: Diagnosis not present

## 2017-04-12 DIAGNOSIS — H04123 Dry eye syndrome of bilateral lacrimal glands: Secondary | ICD-10-CM | POA: Diagnosis not present

## 2017-04-12 DIAGNOSIS — H40023 Open angle with borderline findings, high risk, bilateral: Secondary | ICD-10-CM | POA: Diagnosis not present

## 2017-04-12 DIAGNOSIS — H01003 Unspecified blepharitis right eye, unspecified eyelid: Secondary | ICD-10-CM | POA: Diagnosis not present

## 2017-07-13 DIAGNOSIS — N183 Chronic kidney disease, stage 3 (moderate): Secondary | ICD-10-CM | POA: Diagnosis not present

## 2017-07-13 DIAGNOSIS — E538 Deficiency of other specified B group vitamins: Secondary | ICD-10-CM | POA: Diagnosis not present

## 2017-07-13 DIAGNOSIS — R7303 Prediabetes: Secondary | ICD-10-CM | POA: Diagnosis not present

## 2017-07-13 DIAGNOSIS — I10 Essential (primary) hypertension: Secondary | ICD-10-CM | POA: Diagnosis not present

## 2017-07-26 DIAGNOSIS — E119 Type 2 diabetes mellitus without complications: Secondary | ICD-10-CM | POA: Diagnosis not present

## 2017-08-02 DIAGNOSIS — H40053 Ocular hypertension, bilateral: Secondary | ICD-10-CM | POA: Diagnosis not present

## 2017-08-02 DIAGNOSIS — H40023 Open angle with borderline findings, high risk, bilateral: Secondary | ICD-10-CM | POA: Diagnosis not present

## 2017-11-08 DIAGNOSIS — E538 Deficiency of other specified B group vitamins: Secondary | ICD-10-CM | POA: Diagnosis not present

## 2017-11-08 DIAGNOSIS — I10 Essential (primary) hypertension: Secondary | ICD-10-CM | POA: Diagnosis not present

## 2017-11-08 DIAGNOSIS — E119 Type 2 diabetes mellitus without complications: Secondary | ICD-10-CM | POA: Diagnosis not present

## 2017-11-08 DIAGNOSIS — N183 Chronic kidney disease, stage 3 (moderate): Secondary | ICD-10-CM | POA: Diagnosis not present

## 2017-12-01 DIAGNOSIS — Z23 Encounter for immunization: Secondary | ICD-10-CM | POA: Diagnosis not present

## 2018-01-05 DIAGNOSIS — I10 Essential (primary) hypertension: Secondary | ICD-10-CM | POA: Diagnosis not present

## 2018-01-05 DIAGNOSIS — E119 Type 2 diabetes mellitus without complications: Secondary | ICD-10-CM | POA: Diagnosis not present

## 2018-01-05 DIAGNOSIS — N183 Chronic kidney disease, stage 3 (moderate): Secondary | ICD-10-CM | POA: Diagnosis not present

## 2018-01-05 DIAGNOSIS — Z0000 Encounter for general adult medical examination without abnormal findings: Secondary | ICD-10-CM | POA: Diagnosis not present

## 2018-02-09 DIAGNOSIS — H40023 Open angle with borderline findings, high risk, bilateral: Secondary | ICD-10-CM | POA: Diagnosis not present

## 2018-02-09 DIAGNOSIS — H2513 Age-related nuclear cataract, bilateral: Secondary | ICD-10-CM | POA: Diagnosis not present

## 2018-02-09 DIAGNOSIS — H25013 Cortical age-related cataract, bilateral: Secondary | ICD-10-CM | POA: Diagnosis not present

## 2018-02-19 ENCOUNTER — Emergency Department (HOSPITAL_COMMUNITY): Payer: Medicare Other

## 2018-02-19 ENCOUNTER — Inpatient Hospital Stay (HOSPITAL_COMMUNITY)
Admission: EM | Admit: 2018-02-19 | Discharge: 2018-02-22 | DRG: 418 | Disposition: A | Discharge status: Home / Self Care | Payer: Medicare Other | Attending: General Surgery | Admitting: General Surgery

## 2018-02-19 ENCOUNTER — Encounter (HOSPITAL_COMMUNITY): Payer: Self-pay

## 2018-02-19 DIAGNOSIS — Z7982 Long term (current) use of aspirin: Secondary | ICD-10-CM

## 2018-02-19 DIAGNOSIS — N183 Chronic kidney disease, stage 3 (moderate): Secondary | ICD-10-CM | POA: Diagnosis present

## 2018-02-19 DIAGNOSIS — R7303 Prediabetes: Secondary | ICD-10-CM | POA: Diagnosis present

## 2018-02-19 DIAGNOSIS — N179 Acute kidney failure, unspecified: Secondary | ICD-10-CM | POA: Diagnosis present

## 2018-02-19 DIAGNOSIS — E871 Hypo-osmolality and hyponatremia: Secondary | ICD-10-CM | POA: Diagnosis present

## 2018-02-19 DIAGNOSIS — Z79899 Other long term (current) drug therapy: Secondary | ICD-10-CM

## 2018-02-19 DIAGNOSIS — Z87891 Personal history of nicotine dependence: Secondary | ICD-10-CM

## 2018-02-19 DIAGNOSIS — K8001 Calculus of gallbladder with acute cholecystitis with obstruction: Principal | ICD-10-CM | POA: Diagnosis present

## 2018-02-19 DIAGNOSIS — K819 Cholecystitis, unspecified: Secondary | ICD-10-CM | POA: Diagnosis not present

## 2018-02-19 DIAGNOSIS — K4090 Unilateral inguinal hernia, without obstruction or gangrene, not specified as recurrent: Secondary | ICD-10-CM | POA: Diagnosis not present

## 2018-02-19 DIAGNOSIS — K810 Acute cholecystitis: Secondary | ICD-10-CM

## 2018-02-19 DIAGNOSIS — I10 Essential (primary) hypertension: Secondary | ICD-10-CM

## 2018-02-19 DIAGNOSIS — R1011 Right upper quadrant pain: Secondary | ICD-10-CM | POA: Diagnosis not present

## 2018-02-19 DIAGNOSIS — E860 Dehydration: Secondary | ICD-10-CM | POA: Diagnosis present

## 2018-02-19 DIAGNOSIS — D72829 Elevated white blood cell count, unspecified: Secondary | ICD-10-CM | POA: Diagnosis not present

## 2018-02-19 DIAGNOSIS — K82A1 Gangrene of gallbladder in cholecystitis: Secondary | ICD-10-CM | POA: Diagnosis present

## 2018-02-19 DIAGNOSIS — I129 Hypertensive chronic kidney disease with stage 1 through stage 4 chronic kidney disease, or unspecified chronic kidney disease: Secondary | ICD-10-CM | POA: Diagnosis present

## 2018-02-19 DIAGNOSIS — K8000 Calculus of gallbladder with acute cholecystitis without obstruction: Secondary | ICD-10-CM | POA: Diagnosis not present

## 2018-02-19 DIAGNOSIS — Z419 Encounter for procedure for purposes other than remedying health state, unspecified: Secondary | ICD-10-CM

## 2018-02-19 DIAGNOSIS — K219 Gastro-esophageal reflux disease without esophagitis: Secondary | ICD-10-CM | POA: Diagnosis not present

## 2018-02-19 DIAGNOSIS — I714 Abdominal aortic aneurysm, without rupture: Secondary | ICD-10-CM | POA: Diagnosis present

## 2018-02-19 DIAGNOSIS — E876 Hypokalemia: Secondary | ICD-10-CM | POA: Diagnosis not present

## 2018-02-19 HISTORY — DX: Essential (primary) hypertension: I10

## 2018-02-19 HISTORY — DX: Family history of other specified conditions: Z84.89

## 2018-02-19 HISTORY — DX: Acute cholecystitis: K81.0

## 2018-02-19 LAB — CBC WITH DIFFERENTIAL/PLATELET
Abs Immature Granulocytes: 0.08 10*3/uL — ABNORMAL HIGH (ref 0.00–0.07)
Basophils Absolute: 0 10*3/uL (ref 0.0–0.1)
Basophils Relative: 0 %
Eosinophils Absolute: 0 10*3/uL (ref 0.0–0.5)
Eosinophils Relative: 0 %
HCT: 46.9 % (ref 39.0–52.0)
Hemoglobin: 15.8 g/dL (ref 13.0–17.0)
Immature Granulocytes: 1 %
Lymphocytes Relative: 6 %
Lymphs Abs: 1 10*3/uL (ref 0.7–4.0)
MCH: 31.3 pg (ref 26.0–34.0)
MCHC: 33.7 g/dL (ref 30.0–36.0)
MCV: 92.9 fL (ref 80.0–100.0)
Monocytes Absolute: 1.3 10*3/uL — ABNORMAL HIGH (ref 0.1–1.0)
Monocytes Relative: 9 %
Neutro Abs: 12.9 10*3/uL — ABNORMAL HIGH (ref 1.7–7.7)
Neutrophils Relative %: 84 %
Platelets: 193 10*3/uL (ref 150–400)
RBC: 5.05 MIL/uL (ref 4.22–5.81)
RDW: 12.7 % (ref 11.5–15.5)
WBC: 15.2 10*3/uL — ABNORMAL HIGH (ref 4.0–10.5)
nRBC: 0 % (ref 0.0–0.2)

## 2018-02-19 LAB — COMPREHENSIVE METABOLIC PANEL
ALT: 31 U/L (ref 0–44)
AST: 21 U/L (ref 15–41)
Albumin: 4.2 g/dL (ref 3.5–5.0)
Alkaline Phosphatase: 69 U/L (ref 38–126)
Anion gap: 11 (ref 5–15)
BUN: 45 mg/dL — ABNORMAL HIGH (ref 8–23)
CO2: 26 mmol/L (ref 22–32)
Calcium: 9.8 mg/dL (ref 8.9–10.3)
Chloride: 94 mmol/L — ABNORMAL LOW (ref 98–111)
Creatinine, Ser: 2.2 mg/dL — ABNORMAL HIGH (ref 0.61–1.24)
GFR calc Af Amer: 31 mL/min — ABNORMAL LOW (ref >60)
GFR calc non Af Amer: 27 mL/min — ABNORMAL LOW (ref >60)
Glucose, Bld: 171 mg/dL — ABNORMAL HIGH (ref 70–99)
Potassium: 4.1 mmol/L (ref 3.5–5.1)
Sodium: 131 mmol/L — ABNORMAL LOW (ref 135–145)
Total Bilirubin: 2.5 mg/dL — ABNORMAL HIGH (ref 0.3–1.2)
Total Protein: 8.3 g/dL — ABNORMAL HIGH (ref 6.5–8.1)

## 2018-02-19 LAB — LIPASE, BLOOD: Lipase: 22 U/L (ref 11–51)

## 2018-02-19 MED ORDER — HEPARIN SODIUM (PORCINE) 5000 UNIT/ML IJ SOLN
5000.0000 [IU] | Freq: Three times a day (TID) | INTRAMUSCULAR | Status: DC
  Administered 2018-02-19 – 2018-02-20 (×3): 5000 [IU] via SUBCUTANEOUS
  Filled 2018-02-19 (×2): qty 1

## 2018-02-19 MED ORDER — MORPHINE BOLUS VIA INFUSION: 1.0000 mg | Freq: Three times a day (TID) | INTRAVENOUS | Status: DC | PRN

## 2018-02-19 MED ORDER — ONDANSETRON HCL 4 MG/2ML IJ SOLN
4.0000 mg | Freq: Four times a day (QID) | INTRAMUSCULAR | Status: DC | PRN
  Administered 2018-02-19 – 2018-02-20 (×2): 4 mg via INTRAVENOUS
  Filled 2018-02-19: qty 2

## 2018-02-19 MED ORDER — SODIUM CHLORIDE 0.9 % IV BOLUS
1000.0000 mL | Freq: Once | INTRAVENOUS | Status: AC
  Administered 2018-02-19: 1000 mL via INTRAVENOUS

## 2018-02-19 MED ORDER — SODIUM CHLORIDE 0.9 % IV SOLN
INTRAVENOUS | Status: DC
  Administered 2018-02-19 – 2018-02-22 (×3): via INTRAVENOUS

## 2018-02-19 MED ORDER — MORPHINE SULFATE (PF) 2 MG/ML IV SOLN: 1.0000 mg | Freq: Three times a day (TID) | INTRAVENOUS | Status: DC | PRN

## 2018-02-19 MED ORDER — PIPERACILLIN-TAZOBACTAM 3.375 G IVPB 30 MIN
3.3750 g | Freq: Once | INTRAVENOUS | Status: AC
  Administered 2018-02-19: 3.375 g via INTRAVENOUS
  Filled 2018-02-19: qty 50

## 2018-02-19 MED ORDER — PIPERACILLIN-TAZOBACTAM 3.375 G IVPB
3.3750 g | Freq: Three times a day (TID) | INTRAVENOUS | Status: DC
  Administered 2018-02-19 – 2018-02-22 (×8): 3.375 g via INTRAVENOUS
  Filled 2018-02-19 (×10): qty 50

## 2018-02-19 MED ORDER — IOHEXOL 300 MG/ML  SOLN
30.0000 mL | Freq: Once | INTRAMUSCULAR | Status: AC | PRN
  Administered 2018-02-19: 30 mL via ORAL

## 2018-02-19 MED ORDER — ONDANSETRON HCL 4 MG/2ML IJ SOLN
4.0000 mg | Freq: Once | INTRAMUSCULAR | Status: AC
  Administered 2018-02-19: 4 mg via INTRAVENOUS
  Filled 2018-02-19: qty 2

## 2018-02-19 MED ORDER — PIPERACILLIN-TAZOBACTAM 3.375 G IVPB 30 MIN: 3.3750 g | Freq: Three times a day (TID) | INTRAVENOUS | Status: DC

## 2018-02-19 NOTE — Consult Note (Signed)
Reason for Consult:cholecystitis Referring Physician: Dr. Oletha Cruel is an 84 y.o. male.  HPI: PT is a 84 M w/ h/o HTN, CKD 3, and 3 d h/o abdominal pain.  Pt states that he has had some nausea and emesis after having a thanksgiving meal.  Pt states the pain con't all weekend. He states this is the first occurrence of the pain.  He presented to the ED for evaluation.  ED evaluation the pt underwent CT And labs.  CT showed pericholecystic inflammation but no gallstones.  His TB was elev at 2.5 and his WBC was elev at 15.  General Surg was consulted for further eval and mgmt.  Pt has had no prev abdominal surgeries   Past Medical History:  Diagnosis Date  . Hypertension     History reviewed. No pertinent surgical history.  History reviewed. No pertinent family history.  Social History:  reports that he has quit smoking. He has never used smokeless tobacco. He reports that he drank alcohol. He reports that he has current or past drug history.  Allergies: Not on File  Medications: I have reviewed the patient's current medications.  Results for orders placed or performed during the hospital encounter of 02/19/18 (from the past 48 hour(s))  Lipase, blood     Status: None   Collection Time: 02/19/18 10:43 AM  Result Value Ref Range   Lipase 22 11 - 51 U/L    Comment: Performed at [facility], [place] [address] [place], [place] [address]  Comprehensive metabolic panel     Status: Abnormal   Collection Time: 02/19/18 10:43 AM  Result Value Ref Range   Sodium 131 (L) 135 - 145 mmol/L   Potassium 4.1 3.5 - 5.1 mmol/L   Chloride 94 (L) 98 - 111 mmol/L   CO2 26 22 - 32 mmol/L   Glucose, Bld 171 (H) 70 - 99 mg/dL   BUN 45 (H) 8 - 23 mg/dL   Creatinine, Ser 2.20 (H) 0.61 - 1.24 mg/dL   Calcium 9.8 8.9 - 10.3 mg/dL   Total Protein 8.3 (H) 6.5 - 8.1 g/dL   Albumin 4.2 3.5 - 5.0 g/dL   AST 21 15 - 41 U/L   ALT 31 0 - 44 U/L   Alkaline Phosphatase 69 38 -  126 U/L   Total Bilirubin 2.5 (H) 0.3 - 1.2 mg/dL   GFR calc non Af Amer 27 (L) >60 mL/min   GFR calc Af Amer 31 (L) >60 mL/min   Anion gap 11 5 - 15    Comment: Performed at [facility], [place] [address] [place], [place] [address]  CBC with Differential     Status: Abnormal   Collection Time: 02/19/18 10:43 AM  Result Value Ref Range   WBC 15.2 (H) 4.0 - 10.5 K/uL   RBC 5.05 4.22 - 5.81 MIL/uL   Hemoglobin 15.8 13.0 - 17.0 g/dL   HCT 46.9 39.0 - 52.0 %   MCV 92.9 80.0 - 100.0 fL   MCH 31.3 26.0 - 34.0 pg   MCHC 33.7 30.0 - 36.0 g/dL   RDW 12.7 11.5 - 15.5 %   Platelets 193 150 - 400 K/uL   nRBC 0.0 0.0 - 0.2 %   Neutrophils Relative % 84 %   Neutro Abs 12.9 (H) 1.7 - 7.7 K/uL   Lymphocytes Relative 6 %   Lymphs Abs 1.0 0.7 - 4.0 K/uL   Monocytes Relative 9 %   Monocytes  Absolute 1.3 (H) 0.1 - 1.0 K/uL   Eosinophils Relative 0 %   Eosinophils Absolute 0.0 0.0 - 0.5 K/uL   Basophils Relative 0 %   Basophils Absolute 0.0 0.0 - 0.1 K/uL   Immature Granulocytes 1 %   Abs Immature Granulocytes 0.08 (H) 0.00 - 0.07 K/uL    Comment: Performed at [facility], [place] [address] [place], [place] [address]    Ct Abdomen Pelvis Wo Contrast  Result Date: 02/19/2018 CLINICAL DATA:  Generalized abdominal pain and tenderness, most prominent in the right upper quadrant. Stage 3 chronic kidney disease. EXAM: CT ABDOMEN AND PELVIS WITHOUT CONTRAST TECHNIQUE: Multidetector CT imaging of the abdomen and pelvis was performed following the standard protocol without IV contrast. COMPARISON:  None. FINDINGS: Lower chest: Mild bibasilar scarring versus atelectasis. Oral contrast in the lower thoracic esophagus. Hepatobiliary: Normal size liver. Subcentimeter hypodense lateral segment left liver lobe lesion is too small to characterize and requires no follow-up unless the patient has risk factors for liver malignancy. No additional liver lesions. Distended gallbladder.  Moderate diffuse gallbladder wall thickening and pericholecystic fluid and fat haziness. No radiopaque cholelithiasis. No biliary ductal dilatation. Pancreas: Normal, with no mass or duct dilation. Spleen: Normal size. No mass. Adrenals/Urinary Tract: Normal adrenals. No renal stones. No hydronephrosis. No contour deforming renal masses. Normal bladder. Stomach/Bowel: Probable small hiatal hernia. Otherwise normal stomach. Diffuse mild to moderate small bowel dilatation with air-fluid levels throughout the small bowel. Oral contrast transits to proximal to mid small bowel. No small bowel wall thickening. No focal small bowel caliber transition. Appendix is within normal limits. Moderate right colonic dilatation with fluid levels, with no large bowel wall thickening. No significant colonic diverticulosis. Small left inguinal hernia contains a tiny portion of the sigmoid colon. Vascular/Lymphatic: Atherosclerotic abdominal aorta with 3.1 cm infrarenal abdominal aortic aneurysm. No pathologically enlarged lymph nodes in the abdomen or pelvis. Reproductive: Mildly enlarged prostate. Other: No pneumoperitoneum, ascites or focal fluid collection. Musculoskeletal: No aggressive appearing focal osseous lesions. Marked thoracolumbar spondylosis. IMPRESSION: 1. Distended gallbladder with moderate diffuse gallbladder wall thickening and prominent pericholecystic fat stranding, suggestive of acute cholecystitis. No radiopaque cholelithiasis. Suggest right upper quadrant ultrasound correlation. No biliary ductal dilatation. 2. Generalized small bowel and right colonic dilatation with fluid levels and no discrete bowel caliber transition. Findings favor a reactive adynamic ileus to the gallbladder process. 3. Small left inguinal hernia contains a small portion of the sigmoid colon, without associated bowel complication in this location. 4. Oral contrast in the lower thoracic esophagus, suggesting esophageal dysmotility and/or  gastroesophageal reflux. Probable small hiatal hernia. 5. Infrarenal 3.1 cm Abdominal Aortic Aneurysm (ICD10-I71.9). Recommend follow-up aortic ultrasound in 3 years. This recommendation follows ACR consensus guidelines: White Paper of the ACR Incidental Findings Committee II on Vascular Findings. J Am Coll Radiol 2013; 10:789-794. 6.  Aortic Atherosclerosis (ICD10-I70.0). Electronically Signed   By: Ilona Sorrel M.D.   On: 02/19/2018 13:55    Review of Systems  Constitutional: Negative for chills, fever and malaise/fatigue.  HENT: Negative for ear discharge, hearing loss and sore throat.   Eyes: Negative for blurred vision and discharge.  Respiratory: Negative for cough and shortness of breath.   Cardiovascular: Negative for chest pain, orthopnea and leg swelling.  Gastrointestinal: Positive for abdominal pain, nausea and vomiting. Negative for constipation, diarrhea and heartburn.  Musculoskeletal: Negative for myalgias and neck pain.  Skin: Negative for itching and rash.  Neurological: Negative for dizziness, focal weakness, seizures and loss of  consciousness.  Endo/Heme/Allergies: Negative for environmental allergies. Does not bruise/bleed easily.  Psychiatric/Behavioral: Negative for depression and suicidal ideas.  All other systems reviewed and are negative.  Blood pressure 120/72, pulse 85, temperature (!) 97.4 F (36.3 C), temperature source Oral, resp. rate 16, SpO2 94 %. Physical Exam  Constitutional: He is oriented to person, place, and time. Vital signs are normal. He appears well-developed and well-nourished.  Conversant No acute distress  Eyes: Lids are normal. No scleral icterus.  No lid lag Moist conjunctiva  Neck: No tracheal tenderness present. No thyromegaly present.  No cervical lymphadenopathy  Cardiovascular: Normal rate, regular rhythm and intact distal pulses.  No murmur heard. Respiratory: Effort normal and breath sounds normal. He has no wheezes. He has no  rales.  GI: Soft. Bowel sounds are normal. He exhibits distension. There is no hepatosplenomegaly. There is tenderness (RUQ). There is no rebound and no guarding. No hernia.  Neurological: He is alert and oriented to person, place, and time.  Normal gait and station  Skin: Skin is warm. No rash noted. No cyanosis. Nails show no clubbing.  Normal skin turgor  Psychiatric: Judgment normal.  Appropriate affect    Assessment/Plan: 84 y/o M with cholestasis, cholecystitis, possible choledocholithiasis HTN CKD 3  1. Would rec medicine admit [place] to eval for CBD stones, and gallstones.  Dilated CBD and/or CBD stones would rec GI consult 2. Abx 3. con't NPO 4. Will will follow along  Ralene Ok 02/19/2018, 2:39 PM

## 2018-02-19 NOTE — ED Triage Notes (Addendum)
Patient states Thursday night he felt a knot in his stomach and vomited all Thursday night. Friday patient c/o abdomen being very sore and dry heaves all day.  Patient began a low dose of tylenol with no relief. Patient tried Pepcid and nausea medication with no relief.  Denies diarrhea.  C/O generalized right abdominal pain radiated to right lower. Tender to touch. Stage 3 Kidney disease A/OX4 Ambulatory in triage.

## 2018-02-19 NOTE — H&P (Addendum)
History and Physical  CHAMBERLAIN STEINBORN OFB:[id number] DOB: 1931-06-21 DOA: 02/19/2018  Referring physician: Dalia Heading, PA-C  PCP: Shirline Frees, MD  Outpatient Specialists:  Patient coming from: Home & is able to ambulate  Chief Complaint: Abdominal pain upper quadrant  HPI: Joseph Pennington is a 84 y.o. male with medical history significant for hypertension who presents to the emergency department with onset of abdominal pain nausea and vomiting that started Thursday evening that progressively got worse so he came to the emergency room on seeing him now he said the abdominal pain is improved compared to what he was the abdominal pain has progressively worsened.  Abdominal pain is made worse by palpation movements.  He has had some chills but not sure of fever.  He denies chest pain shortness of breath headache blurry vision neck pain fever cough weakness numbness dizziness anorexia edema diarrhea back pain bloody emesis blood in stool,   ED Course: Patient was given Zofran for nausea intravenously and started on Zosyn.  He also received 1 bolus of IV fluids normal saline at thousand meals  Review of Systems:  . Pt complains of upper quadrant abdominal pain and nausea and bloating feeling  Pt denies any no fever or diarrhea.  Review of systems are otherwise negative   Past Medical History:  Diagnosis Date  . Hypertension    History reviewed. No pertinent surgical history.  Social History:  reports that he has quit smoking. He has never used smokeless tobacco. He reports that he drank alcohol. He reports that he has current or past drug history.   Not on File  History reviewed. No pertinent family history.    Prior to Admission medications   Medication Sig Start Date End Date Taking? Authorizing Provider  acetaminophen (TYLENOL) 500 MG tablet Take 1,000 mg by mouth daily as needed for moderate pain.   Yes [provider]  aspirin EC 81 MG tablet Take 81 mg  by mouth daily.   Yes [provider]  cyanocobalamin (,VITAMIN B-12,) 1000 MCG/ML injection Inject 1,000 mcg into the muscle every 30 (thirty) days.  12/22/17  Yes [provider]  famotidine (PEPCID) 10 MG tablet Take 10 mg by mouth daily as needed for heartburn or indigestion.   Yes [provider]  lisinopril (PRINIVIL,ZESTRIL) 20 MG tablet Take 20 mg by mouth daily. 12/20/17  Yes [provider]    Physical Exam: BP 115/71   Pulse 81   Temp (!) 97.4 F (36.3 C) (Oral)   Resp 16   SpO2 94%   Exam:  . General: 84 y.o. year-old male well developed well nourished in no acute distress.  Alert and oriented x3. . Cardiovascular: Regular rate and rhythm with no rubs or gallops.  No thyromegaly or JVD noted.   [name] Respiratory: Clear to auscultation with no wheezes or rales. Good inspiratory effort. . Abdomen: Soft, tender in the right upper and lower quadrants and periumbilical area but mostly in the upper quadrant there is some guarding, and distended with with reduced bowebowel sounds. . Musculoskeletal: No lower extremity edema. 2/4 pulses in all 4 extremities. . Skin: No ulcerative lesions noted or rashes, . Psychiatry: Mood is appropriate for condition and setting           Labs on Admission:  Basic Metabolic Panel: Recent Labs  Lab 02/19/18 1043  NA 131*  K 4.1  CL 94*  CO2 26  GLUCOSE 171*  BUN 45*  CREATININE 2.20*  CALCIUM 9.8  Liver Function Tests: Recent Labs  Lab 02/19/18 1043  AST 21  ALT 31  ALKPHOS 69  BILITOT 2.5*  PROT 8.3*  ALBUMIN 4.2   Recent Labs  Lab 02/19/18 1043  LIPASE 22   No results for input(s): AMMONIA in the last 168 hours. CBC: Recent Labs  Lab 02/19/18 1043  WBC 15.2*  NEUTROABS 12.9*  HGB 15.8  HCT 46.9  MCV 92.9  PLT 193   Cardiac Enzymes: No results for input(s): CKTOTAL, CKMB, CKMBINDEX, TROPONINI in the last 168 hours.  BNP (last 3 results) No results for input(s): BNP in the  last 8760 hours.  ProBNP (last 3 results) No results for input(s): PROBNP in the last 8760 hours.  CBG: No results for input(s): GLUCAP in the last 168 hours.  Radiological Exams on Admission: Ct Abdomen Pelvis Wo Contrast  Result Date: 02/19/2018 CLINICAL DATA:  Generalized abdominal pain and tenderness, most prominent in the right upper quadrant. Stage 3 chronic kidney disease. EXAM: CT ABDOMEN AND PELVIS WITHOUT CONTRAST TECHNIQUE: Multidetector CT imaging of the abdomen and pelvis was performed following the standard protocol without IV contrast. COMPARISON:  None. FINDINGS: Lower chest: Mild bibasilar scarring versus atelectasis. Oral contrast in the lower thoracic esophagus. Hepatobiliary: Normal size liver. Subcentimeter hypodense lateral segment left liver lobe lesion is too small to characterize and requires no follow-up unless the patient has risk factors for liver malignancy. No additional liver lesions. Distended gallbladder. Moderate diffuse gallbladder wall thickening and pericholecystic fluid and fat haziness. No radiopaque cholelithiasis. No biliary ductal dilatation. Pancreas: Normal, with no mass or duct dilation. Spleen: Normal size. No mass. Adrenals/Urinary Tract: Normal adrenals. No renal stones. No hydronephrosis. No contour deforming renal masses. Normal bladder. Stomach/Bowel: Probable small hiatal hernia. Otherwise normal stomach. Diffuse mild to moderate small bowel dilatation with air-fluid levels throughout the small bowel. Oral contrast transits to proximal to mid small bowel. No small bowel wall thickening. No focal small bowel caliber transition. Appendix is within normal limits. Moderate right colonic dilatation with fluid levels, with no large bowel wall thickening. No significant colonic diverticulosis. Small left inguinal hernia contains a tiny portion of the sigmoid colon. Vascular/Lymphatic: Atherosclerotic abdominal aorta with 3.1 cm infrarenal abdominal aortic  aneurysm. No pathologically enlarged lymph nodes in the abdomen or pelvis. Reproductive: Mildly enlarged prostate. Other: No pneumoperitoneum, ascites or focal fluid collection. Musculoskeletal: No aggressive appearing focal osseous lesions. Marked thoracolumbar spondylosis. IMPRESSION: 1. Distended gallbladder with moderate diffuse gallbladder wall thickening and prominent pericholecystic fat stranding, suggestive of acute cholecystitis. No radiopaque cholelithiasis. Suggest right upper quadrant ultrasound correlation. No biliary ductal dilatation. 2. Generalized small bowel and right colonic dilatation with fluid levels and no discrete bowel caliber transition. Findings favor a reactive adynamic ileus to the gallbladder process. 3. Small left inguinal hernia contains a small portion of the sigmoid colon, without associated bowel complication in this location. 4. Oral contrast in the lower thoracic esophagus, suggesting esophageal dysmotility and/or gastroesophageal reflux. Probable small hiatal hernia. 5. Infrarenal 3.1 cm Abdominal Aortic Aneurysm (ICD10-I71.9). Recommend follow-up aortic ultrasound in 3 years. This recommendation follows ACR consensus guidelines: White Paper of the ACR Incidental Findings Committee II on Vascular Findings. J Am Coll Radiol 2013; 10:789-794. 6.  Aortic Atherosclerosis (ICD10-I70.0). Electronically Signed   By: Ilona Sorrel M.D.   On: 02/19/2018 13:55   US Abdomen Limited Ruq  Result Date: 02/19/2018 CLINICAL DATA:  Right upper quadrant pain. Elevated white blood cell count. EXAM: ULTRASOUND ABDOMEN LIMITED RIGHT UPPER QUADRANT COMPARISON:  None. FINDINGS: Gallbladder: There is a moderately large volume of sludge within the gallbladder and a few stones measuring up to approximately 0.8 cm. The gallbladder wall is thickened at 0.3 cm. There is some pericholecystic fluid present. Common bile duct: Diameter: 0.7 cm. Liver: No focal lesion identified. Within normal limits in  parenchymal echogenicity. Portal vein is patent on color Doppler imaging with normal direction of blood flow towards the liver. IMPRESSION: Findings consistent with acute cholecystitis in this patient with a large volume of sludge in the gallbladder and small gallstones. Electronically Signed   By: Inge Rise M.D.   On: 02/19/2018 15:36    EKG: None  Assessment/Plan Present on Admission: . Acute cholecystitis  Active Problems:   Acute cholecystitis Right upper quadrant ultrasound shows acute cholecystitis with large volume sludge in the gallbladder and small stones.  Patient will be admitted to medicine per the request of surgery Dr. Rosendo Gros IV hydration pain management antiemetics.  Patient will remain n.p.o.  2.  Hypertension stable continue we will cover him with IV antihypertensives since he is n.p.o.  3.  Leukocytosis most likely reactive but possible gallbladder infection cannot be ruled out since he has cholecystitis  4.  Mild hyponatremia patient is receiving IV fluid with normal saline that will help to correct the hyponatremia  Severity of Illness: The appropriate patient status for this patient is INPATIENT. Inpatient status is judged to be reasonable and necessary in order to provide the required intensity of service to ensure the patient's safety. The patient's presenting symptoms, physical exam findings, and initial radiographic and laboratory data in the context of their chronic comorbidities is felt to place them at high risk for further clinical deterioration. Furthermore, it is not anticipated that the patient will be medically stable for discharge from the hospital within 2 midnights of admission. The following factors support the patient status of inpatient.   " The patient's presenting symptoms include acute abdominal pain. " The worrisome physical exam findings include upper quadrant abdominal tenderness. " The initial radiographic and laboratory data are worrisome  because of leukocytosis. " The chronic co-morbidities include hypertension.   * I certify that at the point of admission it is my clinical judgment that the patient will require inpatient hospital care spanning beyond 2 midnights from the point of admission due to high intensity of service, high risk for further deterioration and high frequency of surveillance required.*    DVT prophylaxis: Heparin  Code Status: Full  Family Communication: Daughter Santiago Glad at bedside  Disposition Plan: Home when stable  Consults called: Surgery Dr. Rosendo Gros  Admission status: Inpatient for IV hydration pain management possible surgery    Cristal Deer MD [facility] Pager [phone]  If 7PM-7AM, please contact night-coverage [email or web address] Password [facility]  02/19/2018, 6:39 PM

## 2018-02-19 NOTE — ED Provider Notes (Signed)
[place] DEPT Provider Note   CSN: [id number] Arrival date & time: 02/19/18  9449     History   Chief Complaint Chief Complaint  Patient presents with  . Abdominal Pain  . Emesis    HPI Joseph Pennington is a 84 y.o. male.  HPI Patient presents to the emergency department with abdominal pain with nausea and vomiting that started on Thursday evening.  The patient states that he is been getting more more abdominal pain over that timeframe.  Patient states that palpation and certain activities make the pain worse.  Patient states he did not take any medications prior to arrival for symptoms.  The patient denies chest pain, shortness of breath, headache,blurred vision, neck pain, fever, cough, weakness, numbness, dizziness, anorexia, edema,diarrhea, rash, back pain, dysuria, hematemesis, bloody stool, near syncope, or syncope.  States he may have had chills but is unsure if it is related to the pain or fever. Past Medical History:  Diagnosis Date  . Hypertension     There are no active problems to display for this patient.   History reviewed. No pertinent surgical history.      Home Medications    Prior to Admission medications   Medication Sig Start Date End Date Taking? Authorizing Provider  acetaminophen (TYLENOL) 500 MG tablet Take 1,000 mg by mouth daily as needed for moderate pain.   Yes [provider]  aspirin EC 81 MG tablet Take 81 mg by mouth daily.   Yes [provider]  cyanocobalamin (,VITAMIN B-12,) 1000 MCG/ML injection Inject 1,000 mcg into the muscle every 30 (thirty) days.  12/22/17  Yes [provider]  famotidine (PEPCID) 10 MG tablet Take 10 mg by mouth daily as needed for heartburn or indigestion.   Yes [provider]  lisinopril (PRINIVIL,ZESTRIL) 20 MG tablet Take 20 mg by mouth daily. 12/20/17  Yes [provider]    Family History History reviewed. No pertinent family  history.  Social History Social History   Tobacco Use  . Smoking status: Former Research scientist (life sciences)  . Smokeless tobacco: Never Used  Substance Use Topics  . Alcohol use: Not Currently  . Drug use: Not Currently     Allergies   Patient has no allergy information on record.   Review of Systems Review of Systems All other systems negative except as documented in the HPI. All pertinent positives and negatives as reviewed in the HPI.  Physical Exam Updated Vital Signs BP 120/72   Pulse 85   Temp (!) 97.4 F (36.3 C) (Oral)   Resp 16   SpO2 94%   Physical Exam  Constitutional: He is oriented to person, place, and time. He appears well-developed and well-nourished. No distress.  HENT:  Head: Normocephalic and atraumatic.  Mouth/Throat: Oropharynx is clear and moist.  Eyes: Pupils are equal, round, and reactive to light.  Neck: Normal range of motion. Neck supple.  Cardiovascular: Normal rate, regular rhythm and normal heart sounds. Exam reveals no gallop and no friction rub.  No murmur heard. Pulmonary/Chest: Effort normal and breath sounds normal. No respiratory distress. He has no wheezes.  Abdominal: Soft. Bowel sounds are normal. He exhibits no distension. There is tenderness in the right upper quadrant, right lower quadrant, periumbilical area and left lower quadrant. There is guarding and positive Murphy's sign.  Neurological: He is alert and oriented to person, place, and time. He exhibits normal muscle tone. Coordination normal.  Skin: Skin is warm and dry. Capillary refill takes  less than 2 seconds. No rash noted. No erythema.  Psychiatric: He has a normal mood and affect. His behavior is normal.  Nursing note and vitals reviewed.    ED Treatments / Results  Labs (all labs ordered are listed, but only abnormal results are displayed) Labs Reviewed  COMPREHENSIVE METABOLIC PANEL - Abnormal; Notable for the following components:      Result Value   Sodium 131 (*)     Chloride 94 (*)    Glucose, Bld 171 (*)    BUN 45 (*)    Creatinine, Ser 2.20 (*)    Total Protein 8.3 (*)    Total Bilirubin 2.5 (*)    GFR calc non Af Amer 27 (*)    GFR calc Af Amer 31 (*)    All other components within normal limits  CBC WITH DIFFERENTIAL/PLATELET - Abnormal; Notable for the following components:   WBC 15.2 (*)    Neutro Abs 12.9 (*)    Monocytes Absolute 1.3 (*)    Abs Immature Granulocytes 0.08 (*)    All other components within normal limits  LIPASE, BLOOD  URINALYSIS, ROUTINE W REFLEX MICROSCOPIC    EKG None  Radiology Ct Abdomen Pelvis Wo Contrast  Result Date: 02/19/2018 CLINICAL DATA:  Generalized abdominal pain and tenderness, most prominent in the right upper quadrant. Stage 3 chronic kidney disease. EXAM: CT ABDOMEN AND PELVIS WITHOUT CONTRAST TECHNIQUE: Multidetector CT imaging of the abdomen and pelvis was performed following the standard protocol without IV contrast. COMPARISON:  None. FINDINGS: Lower chest: Mild bibasilar scarring versus atelectasis. Oral contrast in the lower thoracic esophagus. Hepatobiliary: Normal size liver. Subcentimeter hypodense lateral segment left liver lobe lesion is too small to characterize and requires no follow-up unless the patient has risk factors for liver malignancy. No additional liver lesions. Distended gallbladder. Moderate diffuse gallbladder wall thickening and pericholecystic fluid and fat haziness. No radiopaque cholelithiasis. No biliary ductal dilatation. Pancreas: Normal, with no mass or duct dilation. Spleen: Normal size. No mass. Adrenals/Urinary Tract: Normal adrenals. No renal stones. No hydronephrosis. No contour deforming renal masses. Normal bladder. Stomach/Bowel: Probable small hiatal hernia. Otherwise normal stomach. Diffuse mild to moderate small bowel dilatation with air-fluid levels throughout the small bowel. Oral contrast transits to proximal to mid small bowel. No small bowel wall thickening. No  focal small bowel caliber transition. Appendix is within normal limits. Moderate right colonic dilatation with fluid levels, with no large bowel wall thickening. No significant colonic diverticulosis. Small left inguinal hernia contains a tiny portion of the sigmoid colon. Vascular/Lymphatic: Atherosclerotic abdominal aorta with 3.1 cm infrarenal abdominal aortic aneurysm. No pathologically enlarged lymph nodes in the abdomen or pelvis. Reproductive: Mildly enlarged prostate. Other: No pneumoperitoneum, ascites or focal fluid collection. Musculoskeletal: No aggressive appearing focal osseous lesions. Marked thoracolumbar spondylosis. IMPRESSION: 1. Distended gallbladder with moderate diffuse gallbladder wall thickening and prominent pericholecystic fat stranding, suggestive of acute cholecystitis. No radiopaque cholelithiasis. Suggest right upper quadrant ultrasound correlation. No biliary ductal dilatation. 2. Generalized small bowel and right colonic dilatation with fluid levels and no discrete bowel caliber transition. Findings favor a reactive adynamic ileus to the gallbladder process. 3. Small left inguinal hernia contains a small portion of the sigmoid colon, without associated bowel complication in this location. 4. Oral contrast in the lower thoracic esophagus, suggesting esophageal dysmotility and/or gastroesophageal reflux. Probable small hiatal hernia. 5. Infrarenal 3.1 cm Abdominal Aortic Aneurysm (ICD10-I71.9). Recommend follow-up aortic ultrasound in 3 years. This recommendation follows ACR consensus guidelines: White Paper  of the ACR Incidental Findings Committee II on Vascular Findings. J Am Coll Radiol 2013; 10:789-794. 6.  Aortic Atherosclerosis (ICD10-I70.0). Electronically Signed   By: Ilona Sorrel M.D.   On: 02/19/2018 13:55    Procedures Procedures (including critical care time)  Medications Ordered in ED Medications  piperacillin-tazobactam (ZOSYN) IVPB 3.375 g (3.375 g Intravenous New  Bag/Given 02/19/18 1516)  ondansetron (ZOFRAN) injection 4 mg (4 mg Intravenous Given 02/19/18 1130)  iohexol (OMNIPAQUE) 300 MG/ML solution 30 mL (30 mLs Oral Contrast Given 02/19/18 1126)  sodium chloride 0.9 % bolus 1,000 mL (1,000 mLs Intravenous New Bag/Given 02/19/18 1137)     Initial Impression / Assessment and Plan / ED Course  I have reviewed the triage vital signs and the nursing notes.  Pertinent labs & imaging results that were available during my care of the patient were reviewed by me and considered in my medical decision making (see chart for details).     I spoke with general surgery about the patient and they will evaluate the patient with would like him to be admitted by the hospitalist service.  Patient does have hypertension chronic kidney disease stage III prediabetes.  The concern by the surgeon was that the patient may need multiple interventions.  Final Clinical Impressions(s) / ED Diagnoses   Final diagnoses:  RUQ pain    ED Discharge Orders    None       Dalia Heading, PA-C 02/19/18 1523    Lennice Sites, DO 02/19/18 1606

## 2018-02-19 NOTE — ED Notes (Signed)
Patient transported to CT 

## 2018-02-19 NOTE — ED Notes (Signed)
Patient voided, scant amount, ~ 1 mL. Unable to collect for urinalysis.

## 2018-02-20 ENCOUNTER — Inpatient Hospital Stay (HOSPITAL_COMMUNITY): Payer: Medicare Other | Admitting: Certified Registered Nurse Anesthetist

## 2018-02-20 ENCOUNTER — Inpatient Hospital Stay (HOSPITAL_COMMUNITY): Payer: Medicare Other

## 2018-02-20 ENCOUNTER — Encounter (HOSPITAL_COMMUNITY)
Admission: EM | Disposition: A | Discharge status: Home / Self Care | Payer: Self-pay | Source: Home / Self Care | Attending: Internal Medicine

## 2018-02-20 ENCOUNTER — Encounter (HOSPITAL_COMMUNITY): Payer: Self-pay | Admitting: Certified Registered Nurse Anesthetist

## 2018-02-20 ENCOUNTER — Other Ambulatory Visit: Payer: Self-pay

## 2018-02-20 DIAGNOSIS — N179 Acute kidney failure, unspecified: Secondary | ICD-10-CM

## 2018-02-20 DIAGNOSIS — E876 Hypokalemia: Secondary | ICD-10-CM

## 2018-02-20 DIAGNOSIS — R1011 Right upper quadrant pain: Secondary | ICD-10-CM

## 2018-02-20 HISTORY — PX: CHOLECYSTECTOMY: SHX55

## 2018-02-20 LAB — URINALYSIS, ROUTINE W REFLEX MICROSCOPIC
Bacteria, UA: NONE SEEN
Bilirubin Urine: NEGATIVE
Glucose, UA: NEGATIVE mg/dL
Hgb urine dipstick: NEGATIVE
Ketones, ur: NEGATIVE mg/dL
Leukocytes, UA: NEGATIVE
Nitrite: NEGATIVE
Protein, ur: 30 mg/dL — AB
Specific Gravity, Urine: 1.02 (ref 1.005–1.030)
pH: 5 (ref 5.0–8.0)

## 2018-02-20 LAB — COMPREHENSIVE METABOLIC PANEL
ALT: 20 U/L (ref 0–44)
AST: 18 U/L (ref 15–41)
Albumin: 3.1 g/dL — ABNORMAL LOW (ref 3.5–5.0)
Alkaline Phosphatase: 51 U/L (ref 38–126)
Anion gap: 11 (ref 5–15)
BUN: 47 mg/dL — ABNORMAL HIGH (ref 8–23)
CO2: 22 mmol/L (ref 22–32)
Calcium: 8.6 mg/dL — ABNORMAL LOW (ref 8.9–10.3)
Chloride: 100 mmol/L (ref 98–111)
Creatinine, Ser: 1.57 mg/dL — ABNORMAL HIGH (ref 0.61–1.24)
GFR calc Af Amer: 46 mL/min — ABNORMAL LOW (ref >60)
GFR calc non Af Amer: 40 mL/min — ABNORMAL LOW (ref >60)
Glucose, Bld: 139 mg/dL — ABNORMAL HIGH (ref 70–99)
Potassium: 4.2 mmol/L (ref 3.5–5.1)
Sodium: 133 mmol/L — ABNORMAL LOW (ref 135–145)
Total Bilirubin: 2.2 mg/dL — ABNORMAL HIGH (ref 0.3–1.2)
Total Protein: 6.3 g/dL — ABNORMAL LOW (ref 6.5–8.1)

## 2018-02-20 LAB — CBC
HCT: 41.1 % (ref 39.0–52.0)
Hemoglobin: 13.9 g/dL (ref 13.0–17.0)
MCH: 31.7 pg (ref 26.0–34.0)
MCHC: 33.8 g/dL (ref 30.0–36.0)
MCV: 93.8 fL (ref 80.0–100.0)
Platelets: 168 10*3/uL (ref 150–400)
RBC: 4.38 MIL/uL (ref 4.22–5.81)
RDW: 12.5 % (ref 11.5–15.5)
WBC: 7.3 10*3/uL (ref 4.0–10.5)
nRBC: 0 % (ref 0.0–0.2)

## 2018-02-20 LAB — MRSA PCR SCREENING: MRSA by PCR: NEGATIVE

## 2018-02-20 SURGERY — LAPAROSCOPIC CHOLECYSTECTOMY WITH INTRAOPERATIVE CHOLANGIOGRAM
Anesthesia: General

## 2018-02-20 MED ORDER — IOPAMIDOL (ISOVUE-300) INJECTION 61%
INTRAVENOUS | Status: AC
  Filled 2018-02-20: qty 50

## 2018-02-20 MED ORDER — MORPHINE SULFATE (PF) 4 MG/ML IV SOLN: 1.0000 mg | INTRAVENOUS | Status: DC | PRN

## 2018-02-20 MED ORDER — FENTANYL CITRATE (PF) 100 MCG/2ML IJ SOLN
INTRAMUSCULAR | Status: AC
  Filled 2018-02-20: qty 2

## 2018-02-20 MED ORDER — ROCURONIUM BROMIDE 100 MG/10ML IV SOLN
INTRAVENOUS | Status: DC | PRN
  Administered 2018-02-20: 10 mg via INTRAVENOUS
  Administered 2018-02-20: 50 mg via INTRAVENOUS

## 2018-02-20 MED ORDER — PHENYLEPHRINE 40 MCG/ML (10ML) SYRINGE FOR IV PUSH (FOR BLOOD PRESSURE SUPPORT)
PREFILLED_SYRINGE | INTRAVENOUS | Status: DC | PRN
  Administered 2018-02-20: 80 ug via INTRAVENOUS
  Administered 2018-02-20 (×2): 120 ug via INTRAVENOUS

## 2018-02-20 MED ORDER — LACTATED RINGERS IV SOLN: INTRAVENOUS | Status: DC

## 2018-02-20 MED ORDER — PROPOFOL 10 MG/ML IV BOLUS
INTRAVENOUS | Status: DC | PRN
  Administered 2018-02-20: 140 mg via INTRAVENOUS

## 2018-02-20 MED ORDER — PROMETHAZINE HCL 25 MG/ML IJ SOLN: 6.2500 mg | INTRAMUSCULAR | Status: DC | PRN

## 2018-02-20 MED ORDER — ACETAMINOPHEN 500 MG PO TABS
1000.0000 mg | ORAL_TABLET | Freq: Four times a day (QID) | ORAL | Status: DC
  Administered 2018-02-20 – 2018-02-21 (×3): 1000 mg via ORAL
  Filled 2018-02-20 (×4): qty 2

## 2018-02-20 MED ORDER — SUGAMMADEX SODIUM 500 MG/5ML IV SOLN
INTRAVENOUS | Status: AC
  Filled 2018-02-20: qty 5

## 2018-02-20 MED ORDER — MEPERIDINE HCL 50 MG/ML IJ SOLN: 6.2500 mg | INTRAMUSCULAR | Status: DC | PRN

## 2018-02-20 MED ORDER — LACTATED RINGERS IV SOLN
INTRAVENOUS | Status: DC
  Administered 2018-02-20: 13:00:00 via INTRAVENOUS

## 2018-02-20 MED ORDER — OXYCODONE HCL 5 MG PO TABS: 5.0000 mg | ORAL_TABLET | ORAL | Status: DC | PRN

## 2018-02-20 MED ORDER — BUPIVACAINE-EPINEPHRINE 0.25% -1:200000 IJ SOLN
INTRAMUSCULAR | Status: DC | PRN
  Administered 2018-02-20: 30 mL

## 2018-02-20 MED ORDER — FENTANYL CITRATE (PF) 100 MCG/2ML IJ SOLN: 25.0000 ug | INTRAMUSCULAR | Status: DC | PRN

## 2018-02-20 MED ORDER — 0.9 % SODIUM CHLORIDE (POUR BTL) OPTIME
TOPICAL | Status: DC | PRN
  Administered 2018-02-20: 1000 mL

## 2018-02-20 MED ORDER — LIDOCAINE 2% (20 MG/ML) 5 ML SYRINGE
INTRAMUSCULAR | Status: DC | PRN
  Administered 2018-02-20: 60 mg via INTRAVENOUS

## 2018-02-20 MED ORDER — ROCURONIUM BROMIDE 100 MG/10ML IV SOLN
INTRAVENOUS | Status: AC
  Filled 2018-02-20: qty 1

## 2018-02-20 MED ORDER — BUPIVACAINE-EPINEPHRINE (PF) 0.25% -1:200000 IJ SOLN
INTRAMUSCULAR | Status: AC
  Filled 2018-02-20: qty 30

## 2018-02-20 MED ORDER — LIDOCAINE 2% (20 MG/ML) 5 ML SYRINGE
INTRAMUSCULAR | Status: AC
  Filled 2018-02-20: qty 5

## 2018-02-20 MED ORDER — SUGAMMADEX SODIUM 200 MG/2ML IV SOLN
INTRAVENOUS | Status: DC | PRN
  Administered 2018-02-20: 200 mg via INTRAVENOUS

## 2018-02-20 MED ORDER — LACTATED RINGERS IR SOLN
Status: DC | PRN
  Administered 2018-02-20: 1

## 2018-02-20 MED ORDER — FENTANYL CITRATE (PF) 100 MCG/2ML IJ SOLN
INTRAMUSCULAR | Status: DC | PRN
  Administered 2018-02-20 (×2): 50 ug via INTRAVENOUS

## 2018-02-20 MED ORDER — HEPARIN SODIUM (PORCINE) 5000 UNIT/ML IJ SOLN
5000.0000 [IU] | Freq: Three times a day (TID) | INTRAMUSCULAR | Status: DC
  Administered 2018-02-21 – 2018-02-22 (×4): 5000 [IU] via SUBCUTANEOUS
  Filled 2018-02-20 (×4): qty 1

## 2018-02-20 MED ORDER — PROPOFOL 10 MG/ML IV BOLUS
INTRAVENOUS | Status: AC
  Filled 2018-02-20: qty 20

## 2018-02-20 SURGICAL SUPPLY — 42 items
ADH SKN CLS APL DERMABOND .7 (GAUZE/BANDAGES/DRESSINGS) ×1
APPLIER CLIP ROT 10 11.4 M/L (STAPLE) ×2
APR CLP MED LRG 11.4X10 (STAPLE) ×1
BAG SPEC RTRVL 10 TROC 200 (ENDOMECHANICALS) ×1
CABLE HIGH FREQUENCY MONO STRZ (ELECTRODE) ×2 IMPLANT
CATH REDDICK CHOLANGI 4FR 50CM (CATHETERS) IMPLANT
CHLORAPREP W/TINT 26ML (MISCELLANEOUS) ×2 IMPLANT
CLIP APPLIE ROT 10 11.4 M/L (STAPLE) ×1 IMPLANT
COVER MAYO STAND STRL (DRAPES) ×2 IMPLANT
COVER SURGICAL LIGHT HANDLE (MISCELLANEOUS) ×2 IMPLANT
COVER WAND RF STERILE (DRAPES) IMPLANT
DECANTER SPIKE VIAL GLASS SM (MISCELLANEOUS) ×2 IMPLANT
DERMABOND ADVANCED (GAUZE/BANDAGES/DRESSINGS) ×1
DERMABOND ADVANCED .7 DNX12 (GAUZE/BANDAGES/DRESSINGS) ×1 IMPLANT
DRAIN CHANNEL 19F RND (DRAIN) ×1 IMPLANT
DRAPE C-ARM 42X120 X-RAY (DRAPES) ×2 IMPLANT
ELECT REM PT RETURN 15FT ADLT (MISCELLANEOUS) ×2 IMPLANT
ENDOLOOP SUT PDS II  0 18 (SUTURE) ×1
ENDOLOOP SUT PDS II 0 18 (SUTURE) IMPLANT
EVACUATOR SILICONE 100CC (DRAIN) ×1 IMPLANT
FILTER SMOKE EVAC LAPAROSHD (FILTER) ×2 IMPLANT
GLOVE BIOGEL PI IND STRL 7.5 (GLOVE) ×1 IMPLANT
GLOVE BIOGEL PI INDICATOR 7.5 (GLOVE) ×1
GLOVE ECLIPSE 7.5 STRL STRAW (GLOVE) ×2 IMPLANT
GOWN STRL REUS W/TWL XL LVL3 (GOWN DISPOSABLE) ×6 IMPLANT
HEMOSTAT SNOW SURGICEL 2X4 (HEMOSTASIS) IMPLANT
HEMOSTAT SURGICEL 4X8 (HEMOSTASIS) IMPLANT
KIT BASIN OR (CUSTOM PROCEDURE TRAY) ×2 IMPLANT
POUCH RETRIEVAL ECOSAC 10 (ENDOMECHANICALS) ×1 IMPLANT
POUCH RETRIEVAL ECOSAC 10MM (ENDOMECHANICALS) ×1
SCISSORS LAP 5X35 DISP (ENDOMECHANICALS) ×2 IMPLANT
SET CHOLANGIOGRAPH MIX (MISCELLANEOUS) ×2 IMPLANT
SET IRRIG TUBING LAPAROSCOPIC (IRRIGATION / IRRIGATOR) ×2 IMPLANT
SLEEVE XCEL OPT CAN 5 100 (ENDOMECHANICALS) ×2 IMPLANT
SUT ETHILON 2 0 PS N (SUTURE) ×1 IMPLANT
SUT MNCRL AB 4-0 PS2 18 (SUTURE) ×2 IMPLANT
TOWEL OR 17X26 10 PK STRL BLUE (TOWEL DISPOSABLE) ×2 IMPLANT
TRAY LAPAROSCOPIC (CUSTOM PROCEDURE TRAY) ×2 IMPLANT
TROCAR BLADELESS OPT 5 100 (ENDOMECHANICALS) ×2 IMPLANT
TROCAR XCEL BLUNT TIP 100MML (ENDOMECHANICALS) ×2 IMPLANT
TROCAR XCEL NON-BLD 11X100MML (ENDOMECHANICALS) ×2 IMPLANT
TUBING INSUF HEATED (TUBING) ×2 IMPLANT

## 2018-02-20 NOTE — Op Note (Signed)
Preoperative Diagnosis: Cholelithiasis and acute cholecystitis  Postoprative Diagnosis: Cholelithiasis and acute gangrenous cholecystitis  Procedure: Procedure(s): LAPAROSCOPIC CHOLECYSTECTOMY   Surgeon: Excell Seltzer T   Assistants: Saverio Danker, PA  Anesthesia:  General endotracheal anesthesia  Indications: Patient is a generally healthy 84 year old male who presents with a 3-4 history of initially severe acute epigastric and mid abdominal pain and nausea and vomiting and now persistent localized right upper quadrant pain.  CT and then ultrasound has shown evidence of acute cholecystitis.  After discussing options with the patient we have elected to proceed with laparoscopic cholecystectomy.  The nature of the surgery and recovery and potential complications were discussed in detail documented elsewhere and all questions answered.    Procedure Detail: Patient was brought to the operating room, placed in the supine position on the operating table, and general endotracheal anesthesia induced.  He was already on broad-spectrum IV antibiotics.  The abdomen was widely sterilely prepped and draped.  Patient timeout was performed and correct procedure verified.  Access was obtained with an assigned trocar supraumbilical in the midline through mattress suture of 0 Vicryl and pneumoperitoneum established.  Under direct vision an 11 mm trocar was placed subxiphoid and 2 5 mm trochars along the right subcostal margin.  There were dense inflammatory adhesions of the omentum up to the gallbladder with exudate.  These were taken down with blunt dissection exposing the gallbladder which was distended with patchy gangrenous changes.  It was decompressed with the aspiration needle.  The fundus was grasped and elevated.  The infundibulum was exposed with careful blunt and sucker dissection and grasped and retracted inferior laterally.  The distal gallbladder was then carefully dissected with blunt and cautery  dissection.  Thick peritoneum anterior and posterior to the distal gallbladder was divided.  Even the distal gallbladder wall appeared necrotic.  The dissection was carefully carried down along the gallbladder wall.  The cystic artery clearly coursing up onto the gallbladder wall was identified and divided between 2 proximal and one distal clip.  Distal gallbladder was fully mobilized away from the cholecystic plate.  I then dissected distally down to what appeared to be essentially the junction of the gallbladder and cystic duct with still dense surrounding inflammation.  I elected to divide the cystic duct here.  The duct was clipped really up on the gallbladder and then divided below this and a 0 PDS Endoloop used to secure the cystic duct again what appeared to be the junction with the gallbladder and viable tissue.  A clip was additionally placed over this.  No evidence of bile leak.  The gallbladder was then dissected free from its bed using hook cautery and placed in an eco-sac and removed through the umbilicus.  The right upper quadrant was thoroughly irrigated.  There was no hemostasis or evidence of bile leakage or evidence of injury.  A 19 Blake closed suction drain was left in Morison's pouch and brought out through a lateral trocar site.  All CO2 was evacuated and trochars removed and the mattress suture secured above the umbilicus.  Skin incisions were closed with subcuticular Monocryl and Dermabond.  Sponge needle and instrument counts were correct.    Findings: Acute gangrenous cholecystitis  Estimated Blood Loss:  less than 50 mL         Drains: 19 round Blake and right upper quadrant  Blood Given: none          Specimens: Gallbladder and contents        Complications:  *  No complications entered in OR log *         Disposition: PACU - hemodynamically stable.         Condition: stable

## 2018-02-20 NOTE — Anesthesia Preprocedure Evaluation (Addendum)
Anesthesia Evaluation  Patient identified by MRN, date of birth, ID band Patient awake    Reviewed: Allergy & Precautions, NPO status , Patient's Chart, lab work & pertinent test results  Airway Mallampati: III  TM Distance: >3 FB Neck ROM: Full    Dental  (+) Teeth Intact, Dental Advisory Given   Pulmonary former smoker,    breath sounds clear to auscultation       Cardiovascular hypertension, Pt. on medications  Rhythm:Regular Rate:Normal     Neuro/Psych negative neurological ROS  negative psych ROS   GI/Hepatic Neg liver ROS, GERD  Medicated,  Endo/Other  negative endocrine ROS  Renal/GU negative Renal ROS     Musculoskeletal   Abdominal Normal abdominal exam  (+)   Peds  Hematology   Anesthesia Other Findings   Reproductive/Obstetrics                            Anesthesia Physical Anesthesia Plan  ASA: III  Anesthesia Plan: General   Post-op Pain Management:    Induction: Intravenous  PONV Risk Score and Plan: 3 and Ondansetron, Dexamethasone and Treatment may vary due to age or medical condition  Airway Management Planned: Oral ETT  Additional Equipment: None  Intra-op Plan:   Post-operative Plan: Extubation in OR  Informed Consent: I have reviewed the patients History and Physical, chart, labs and discussed the procedure including the risks, benefits and alternatives for the proposed anesthesia with the patient or authorized representative who has indicated his/her understanding and acceptance.   Dental advisory given  Plan Discussed with: CRNA  Anesthesia Plan Comments:        Anesthesia Quick Evaluation

## 2018-02-20 NOTE — Anesthesia Postprocedure Evaluation (Signed)
Anesthesia Post Note  Patient: Joseph Pennington  Procedure(s) Performed: LAPAROSCOPIC CHOLECYSTECTOMY (N/A )     Patient location during evaluation: PACU Anesthesia Type: General Level of consciousness: awake and alert Pain management: pain level controlled Vital Signs Assessment: post-procedure vital signs reviewed and stable Respiratory status: spontaneous breathing, nonlabored ventilation, respiratory function stable and patient connected to nasal cannula oxygen Cardiovascular status: blood pressure returned to baseline and stable Postop Assessment: no apparent nausea or vomiting Anesthetic complications: no    Last Vitals:  Vitals:   02/20/18 1500 02/20/18 1515  BP: 103/83 130/69  Pulse: 82 78  Resp: 17 19  Temp:  36.9 C  SpO2: 100% 99%    Last Pain:  Vitals:   02/20/18 1515  TempSrc:   PainSc: 0-No pain                 Effie Berkshire

## 2018-02-20 NOTE — Transfer of Care (Signed)
Immediate Anesthesia Transfer of Care Note  Patient: Joseph Pennington  Procedure(s) Performed: LAPAROSCOPIC CHOLECYSTECTOMY (N/A )  Patient Location: PACU  Anesthesia Type:General  Level of Consciousness: awake, alert  and oriented  Airway & Oxygen Therapy: Patient Spontanous Breathing and Patient connected to face mask oxygen  Post-op Assessment: Report given to RN and Post -op Vital signs reviewed and stable  Post vital signs: Reviewed and stable  Last Vitals:  Vitals Value Taken Time  BP 133/66 02/20/2018  2:45 PM  Temp 36.9 C 02/20/2018  2:44 PM  Pulse 41 02/20/2018  2:44 PM  Resp 18 02/20/2018  2:49 PM  SpO2 79 % 02/20/2018  2:44 PM  Vitals shown include unvalidated device data.  Last Pain:  Vitals:   02/20/18 1444  TempSrc:   PainSc: 0-No pain         Complications: No apparent anesthesia complications

## 2018-02-20 NOTE — Progress Notes (Signed)
PROGRESS NOTE    RECO SHONK  KGM:[id number] DOB: June 17, 1931 DOA: 02/19/2018 PCP: Shirline Frees, MD   Brief Narrative:  HPI On 02/19/2018 by Dr. Cristal Deer Joseph Pennington is a 84 y.o. male with medical history significant for hypertension who presents to the emergency department with onset of abdominal pain nausea and vomiting that started Thursday evening that progressively got worse so he came to the emergency room on seeing him now he said the abdominal pain is improved compared to what he was the abdominal pain has progressively worsened.  Abdominal pain is made worse by palpation movements.  He has had some chills but not sure of fever.  He denies chest pain shortness of breath headache blurry vision neck pain fever cough weakness numbness dizziness anorexia edema diarrhea back pain bloody emesis blood in stool, Assessment & Plan   Abdominal pain secondary to Acute cholecystitis -Patient presented with discomfort and right upper quadrant tenderness -CT abdomen pelvis showed distended gallbladder with moderate diffuse gallbladder wall thickening, prominent pericholecystic stranding suggestive of acute cholecystitis. -RUQ ultrasound showed findings consistent with acute cholecystitis, large volume of sludge in the gallbladder and small gallstones Essential hypertension  Leukocytosis -Suspect secondary to the above, has resolved  Mild hyponatremia -Likely secondary to volume loss and dehydration -Continue IVF, monitor BMP  Acute kidney injury -Creatinine on admission 2.2, currently down to 1.57 -No labs in epic since 2010, at that time creatinine was 1.1-1.2 -Continue IVF and to monitor BMP  Infrarenal abdominal aortic aneurysm -Noted on CT abdomen pelvis, 3.1 cm -Commended follow-up aortic ultrasound in 3 years  DVT Prophylaxis  Heparin  Code Status: Full  Family Communication: None at bedside  Disposition Plan: Admitted. Surgery today.  Consultants General  surgery  Procedures  RUQ [place] Laparoscopic cholecystectomy   Antibiotics   Anti-infectives (From admission, onward)   Start     Dose/Rate Route Frequency Ordered Stop   02/19/18 2200  piperacillin-tazobactam (ZOSYN) IVPB 3.375 g  Status:  Discontinued     3.375 g 100 mL/hr over 30 Minutes Intravenous Every 8 hours 02/19/18 2039 02/19/18 2128   02/19/18 2200  [MAR Hold]  piperacillin-tazobactam (ZOSYN) IVPB 3.375 g     (MAR Hold since Mon 02/20/2018 at 1212. Reason: Transfer to a Procedural area.)   3.375 g 12.5 mL/hr over 240 Minutes Intravenous Every 8 hours 02/19/18 2128     02/19/18 1415  piperacillin-tazobactam (ZOSYN) IVPB 3.375 g     3.375 g 100 mL/hr over 30 Minutes Intravenous  Once 02/19/18 1412 02/19/18 1546      Subjective:   [name] seen and examined today.  These complain of right-sided pain/soreness, which worsens with movement.  Denies current chest pain, shortness of breath, nausea or vomiting, diarrhea or constipation, dizziness or headache.  Objective:   Vitals:   02/19/18 2000 02/19/18 2041 02/19/18 2100 02/20/18 0443  BP: 123/71 116/68  109/66  Pulse: 80 84  85  Resp:  18  18  Temp:  98.5 F (36.9 C)  98.4 F (36.9 C)  TempSrc:  Oral    SpO2: 94% 94%  96%  Weight:   99.2 kg   Height:   6\' 1"  (1.854 m)     Intake/Output Summary (Last 24 hours) at 02/20/2018 1441 Last data filed at 02/20/2018 0830 Gross per 24 hour  Intake 1229.24 ml  Output 500 ml  Net 729.24 ml   Filed Weights   02/19/18 2100  Weight: 99.2 kg    Exam  General: Well developed, well nourished, NAD, appears stated age  19: NCAT, mucous membranes moist.   Neck: Supple  Cardiovascular: S1 S2 auscultated, no murmurs, RRR  Respiratory: Clear to auscultation bilaterally with equal chest rise  Abdomen: Soft, RUQ TTP, nondistended, + bowel sounds  Extremities: warm dry without cyanosis clubbing or edema  Neuro: AAOx3, nonfocal  Psych: Normal affect and demeanor  with intact judgement and insight   Data Reviewed: I have personally reviewed following labs and imaging studies  CBC: Recent Labs  Lab 02/19/18 1043 02/20/18 0542  WBC 15.2* 7.3  NEUTROABS 12.9*  --   HGB 15.8 13.9  HCT 46.9 41.1  MCV 92.9 93.8  PLT 193 932   Basic Metabolic Panel: Recent Labs  Lab 02/19/18 1043 02/20/18 0542  NA 131* 133*  K 4.1 4.2  CL 94* 100  CO2 26 22  GLUCOSE 171* 139*  BUN 45* 47*  CREATININE 2.20* 1.57*  CALCIUM 9.8 8.6*   GFR: Estimated Creatinine Clearance: 43.4 mL/min (A) (by C-G formula based on SCr of 1.57 mg/dL (H)). Liver Function Tests: Recent Labs  Lab 02/19/18 1043 02/20/18 0542  AST 21 18  ALT 31 20  ALKPHOS 69 51  BILITOT 2.5* 2.2*  PROT 8.3* 6.3*  ALBUMIN 4.2 3.1*   Recent Labs  Lab 02/19/18 1043  LIPASE 22   No results for input(s): AMMONIA in the last 168 hours. Coagulation Profile: No results for input(s): INR, PROTIME in the last 168 hours. Cardiac Enzymes: No results for input(s): CKTOTAL, CKMB, CKMBINDEX, TROPONINI in the last 168 hours. BNP (last 3 results) No results for input(s): PROBNP in the last 8760 hours. HbA1C: No results for input(s): HGBA1C in the last 72 hours. CBG: No results for input(s): GLUCAP in the last 168 hours. Lipid Profile: No results for input(s): CHOL, HDL, LDLCALC, TRIG, CHOLHDL, LDLDIRECT in the last 72 hours. Thyroid Function Tests: No results for input(s): TSH, T4TOTAL, FREET4, T3FREE, THYROIDAB in the last 72 hours. Anemia Panel: No results for input(s): VITAMINB12, FOLATE, FERRITIN, TIBC, IRON, RETICCTPCT in the last 72 hours. Urine analysis:    Component Value Date/Time   COLORURINE YELLOW 02/20/2018 0315   APPEARANCEUR HAZY (A) 02/20/2018 0315   LABSPEC 1.020 02/20/2018 0315   PHURINE 5.0 02/20/2018 0315   GLUCOSEU NEGATIVE 02/20/2018 0315   HGBUR NEGATIVE 02/20/2018 0315   BILIRUBINUR NEGATIVE 02/20/2018 0315   KETONESUR NEGATIVE 02/20/2018 0315   PROTEINUR 30  (A) 02/20/2018 0315   UROBILINOGEN 1.0 10/06/2008 1450   NITRITE NEGATIVE 02/20/2018 0315   LEUKOCYTESUR NEGATIVE 02/20/2018 0315   Sepsis Labs: @LABRCNTIP (procalcitonin:4,lacticidven:4)  ) Recent Results (from the past 240 hour(s))  MRSA PCR Screening     Status: None   Collection Time: 02/20/18  9:31 AM  Result Value Ref Range Status   MRSA by PCR NEGATIVE NEGATIVE Final    Comment:        The GeneXpert MRSA Assay (FDA approved for NASAL specimens only), is one component of a comprehensive MRSA colonization surveillance program. It is not intended to diagnose MRSA infection nor to guide or monitor treatment for MRSA infections. Performed at [facility], [place] [address] [place], [place] [address]       Radiology Studies: Ct Abdomen Pelvis Wo Contrast  Result Date: 02/19/2018 CLINICAL DATA:  Generalized abdominal pain and tenderness, most prominent in the right upper quadrant. Stage 3 chronic kidney disease. EXAM: CT ABDOMEN AND PELVIS WITHOUT CONTRAST TECHNIQUE: Multidetector CT imaging of the abdomen and pelvis  was performed following the standard protocol without IV contrast. COMPARISON:  None. FINDINGS: Lower chest: Mild bibasilar scarring versus atelectasis. Oral contrast in the lower thoracic esophagus. Hepatobiliary: Normal size liver. Subcentimeter hypodense lateral segment left liver lobe lesion is too small to characterize and requires no follow-up unless the patient has risk factors for liver malignancy. No additional liver lesions. Distended gallbladder. Moderate diffuse gallbladder wall thickening and pericholecystic fluid and fat haziness. No radiopaque cholelithiasis. No biliary ductal dilatation. Pancreas: Normal, with no mass or duct dilation. Spleen: Normal size. No mass. Adrenals/Urinary Tract: Normal adrenals. No renal stones. No hydronephrosis. No contour deforming renal masses. Normal bladder. Stomach/Bowel: Probable small hiatal hernia.  Otherwise normal stomach. Diffuse mild to moderate small bowel dilatation with air-fluid levels throughout the small bowel. Oral contrast transits to proximal to mid small bowel. No small bowel wall thickening. No focal small bowel caliber transition. Appendix is within normal limits. Moderate right colonic dilatation with fluid levels, with no large bowel wall thickening. No significant colonic diverticulosis. Small left inguinal hernia contains a tiny portion of the sigmoid colon. Vascular/Lymphatic: Atherosclerotic abdominal aorta with 3.1 cm infrarenal abdominal aortic aneurysm. No pathologically enlarged lymph nodes in the abdomen or pelvis. Reproductive: Mildly enlarged prostate. Other: No pneumoperitoneum, ascites or focal fluid collection. Musculoskeletal: No aggressive appearing focal osseous lesions. Marked thoracolumbar spondylosis. IMPRESSION: 1. Distended gallbladder with moderate diffuse gallbladder wall thickening and prominent pericholecystic fat stranding, suggestive of acute cholecystitis. No radiopaque cholelithiasis. Suggest right upper quadrant ultrasound correlation. No biliary ductal dilatation. 2. Generalized small bowel and right colonic dilatation with fluid levels and no discrete bowel caliber transition. Findings favor a reactive adynamic ileus to the gallbladder process. 3. Small left inguinal hernia contains a small portion of the sigmoid colon, without associated bowel complication in this location. 4. Oral contrast in the lower thoracic esophagus, suggesting esophageal dysmotility and/or gastroesophageal reflux. Probable small hiatal hernia. 5. Infrarenal 3.1 cm Abdominal Aortic Aneurysm (ICD10-I71.9). Recommend follow-up aortic ultrasound in 3 years. This recommendation follows ACR consensus guidelines: White Paper of the ACR Incidental Findings Committee II on Vascular Findings. J Am Coll Radiol 2013; 10:789-794. 6.  Aortic Atherosclerosis (ICD10-I70.0). Electronically Signed   By:  Ilona Sorrel M.D.   On: 02/19/2018 13:55   US Abdomen Limited Ruq  Result Date: 02/19/2018 CLINICAL DATA:  Right upper quadrant pain. Elevated white blood cell count. EXAM: ULTRASOUND ABDOMEN LIMITED RIGHT UPPER QUADRANT COMPARISON:  None. FINDINGS: Gallbladder: There is a moderately large volume of sludge within the gallbladder and a few stones measuring up to approximately 0.8 cm. The gallbladder wall is thickened at 0.3 cm. There is some pericholecystic fluid present. Common bile duct: Diameter: 0.7 cm. Liver: No focal lesion identified. Within normal limits in parenchymal echogenicity. Portal vein is patent on color Doppler imaging with normal direction of blood flow towards the liver. IMPRESSION: Findings consistent with acute cholecystitis in this patient with a large volume of sludge in the gallbladder and small gallstones. Electronically Signed   By: Inge Rise M.D.   On: 02/19/2018 15:36     Scheduled Meds: . [MAR Hold] heparin  5,000 Units Subcutaneous Q8H   Continuous Infusions: . sodium chloride 75 mL/hr at 02/19/18 2107  . lactated ringers    . lactated ringers 100 mL/hr at 02/20/18 1233  . [MAR Hold] piperacillin-tazobactam (ZOSYN)  IV 3.375 g (02/20/18 0528)     LOS: 1 day   Time Spent in minutes   30 minutes  Santiel Topper D.O. on  02/20/2018 at 2:41 PM  Between 7am to 7pm - Please see pager noted on [email or web address]  After 7pm go to [email or web address]  And look for the night coverage person covering for me after hours  [facility] Office  [phone]

## 2018-02-20 NOTE — Progress Notes (Signed)
Patient ID: Joseph Pennington, male   DOB: 1931/12/28, 84 y.o.   MRN: [id number]     Subjective: Feels better today with less pain.  Still very "sore" in his right upper quadrant.  Objective: Vital signs in last 24 hours: Temp:  [97.4 F (36.3 C)-98.5 F (36.9 C)] 98.4 F (36.9 C) (12/02 0443) Pulse Rate:  [80-115] 85 (12/02 0443) Resp:  [16-18] 18 (12/02 0443) BP: (93-123)/(62-78) 109/66 (12/02 0443) SpO2:  [94 %-98 %] 96 % (12/02 0443) Weight:  [99.2 kg] 99.2 kg (12/01 2100) Last BM Date: 02/19/18  Intake/Output from previous day: 12/01 0701 - 12/02 0700 In: 1229.2 [I.V.:123.7; IV Piggyback:1105.5] Out: 500 [Urine:500] Intake/Output this shift: No intake/output data recorded.  General appearance: alert, cooperative and no distress GI: Significant tenderness and guarding localized in the right upper quadrant.  Lab Results:  Recent Labs    02/19/18 1043 02/20/18 0542  WBC 15.2* 7.3  HGB 15.8 13.9  HCT 46.9 41.1  PLT 193 168   BMET Recent Labs    02/19/18 1043 02/20/18 0542  NA 131* 133*  K 4.1 4.2  CL 94* 100  CO2 26 22  GLUCOSE 171* 139*  BUN 45* 47*  CREATININE 2.20* 1.57*  CALCIUM 9.8 8.6*     Studies/Results: Ct Abdomen Pelvis Wo Contrast  Result Date: 02/19/2018 CLINICAL DATA:  Generalized abdominal pain and tenderness, most prominent in the right upper quadrant. Stage 3 chronic kidney disease. EXAM: CT ABDOMEN AND PELVIS WITHOUT CONTRAST TECHNIQUE: Multidetector CT imaging of the abdomen and pelvis was performed following the standard protocol without IV contrast. COMPARISON:  None. FINDINGS: Lower chest: Mild bibasilar scarring versus atelectasis. Oral contrast in the lower thoracic esophagus. Hepatobiliary: Normal size liver. Subcentimeter hypodense lateral segment left liver lobe lesion is too small to characterize and requires no follow-up unless the patient has risk factors for liver malignancy. No additional liver lesions. Distended gallbladder.  Moderate diffuse gallbladder wall thickening and pericholecystic fluid and fat haziness. No radiopaque cholelithiasis. No biliary ductal dilatation. Pancreas: Normal, with no mass or duct dilation. Spleen: Normal size. No mass. Adrenals/Urinary Tract: Normal adrenals. No renal stones. No hydronephrosis. No contour deforming renal masses. Normal bladder. Stomach/Bowel: Probable small hiatal hernia. Otherwise normal stomach. Diffuse mild to moderate small bowel dilatation with air-fluid levels throughout the small bowel. Oral contrast transits to proximal to mid small bowel. No small bowel wall thickening. No focal small bowel caliber transition. Appendix is within normal limits. Moderate right colonic dilatation with fluid levels, with no large bowel wall thickening. No significant colonic diverticulosis. Small left inguinal hernia contains a tiny portion of the sigmoid colon. Vascular/Lymphatic: Atherosclerotic abdominal aorta with 3.1 cm infrarenal abdominal aortic aneurysm. No pathologically enlarged lymph nodes in the abdomen or pelvis. Reproductive: Mildly enlarged prostate. Other: No pneumoperitoneum, ascites or focal fluid collection. Musculoskeletal: No aggressive appearing focal osseous lesions. Marked thoracolumbar spondylosis. IMPRESSION: 1. Distended gallbladder with moderate diffuse gallbladder wall thickening and prominent pericholecystic fat stranding, suggestive of acute cholecystitis. No radiopaque cholelithiasis. Suggest right upper quadrant ultrasound correlation. No biliary ductal dilatation. 2. Generalized small bowel and right colonic dilatation with fluid levels and no discrete bowel caliber transition. Findings favor a reactive adynamic ileus to the gallbladder process. 3. Small left inguinal hernia contains a small portion of the sigmoid colon, without associated bowel complication in this location. 4. Oral contrast in the lower thoracic esophagus, suggesting esophageal dysmotility and/or  gastroesophageal reflux. Probable small hiatal hernia. 5. Infrarenal 3.1 cm Abdominal Aortic Aneurysm (  ICD10-I71.9). Recommend follow-up aortic ultrasound in 3 years. This recommendation follows ACR consensus guidelines: White Paper of the ACR Incidental Findings Committee II on Vascular Findings. J Am Coll Radiol 2013; 10:789-794. 6.  Aortic Atherosclerosis (ICD10-I70.0). Electronically Signed   By: Ilona Sorrel M.D.   On: 02/19/2018 13:55   US Abdomen Limited Ruq  Result Date: 02/19/2018 CLINICAL DATA:  Right upper quadrant pain. Elevated white blood cell count. EXAM: ULTRASOUND ABDOMEN LIMITED RIGHT UPPER QUADRANT COMPARISON:  None. FINDINGS: Gallbladder: There is a moderately large volume of sludge within the gallbladder and a few stones measuring up to approximately 0.8 cm. The gallbladder wall is thickened at 0.3 cm. There is some pericholecystic fluid present. Common bile duct: Diameter: 0.7 cm. Liver: No focal lesion identified. Within normal limits in parenchymal echogenicity. Portal vein is patent on color Doppler imaging with normal direction of blood flow towards the liver. IMPRESSION: Findings consistent with acute cholecystitis in this patient with a large volume of sludge in the gallbladder and small gallstones. Electronically Signed   By: Inge Rise M.D.   On: 02/19/2018 15:36    Anti-infectives: Anti-infectives (From admission, onward)   Start     Dose/Rate Route Frequency Ordered Stop   02/19/18 2200  piperacillin-tazobactam (ZOSYN) IVPB 3.375 g  Status:  Discontinued     3.375 g 100 mL/hr over 30 Minutes Intravenous Every 8 hours 02/19/18 2039 02/19/18 2128   02/19/18 2200  piperacillin-tazobactam (ZOSYN) IVPB 3.375 g     3.375 g 12.5 mL/hr over 240 Minutes Intravenous Every 8 hours 02/19/18 2128     02/19/18 1415  piperacillin-tazobactam (ZOSYN) IVPB 3.375 g     3.375 g 100 mL/hr over 30 Minutes Intravenous  Once 02/19/18 1412 02/19/18 1546       Assessment/Plan: Cholelithiasis and acute cholecystitis.  Patient is overall improved with antibiotics and IV fluids but has continued discomfort and significant tenderness in the right upper quadrant.  I think the patient would be best served with proceeding with laparoscopic cholecystectomy.  Discussed alternatives of antibiotic management or percutaneous drainage.  I discussed the nature of surgery and indications, expected recovery, risks of open procedure, bleeding, infection, anesthetic complications or injury to surrounding structures.  All his questions were answered.  He agrees to proceed.    LOS: 1 day    Edward Jolly 02/20/2018

## 2018-02-20 NOTE — Anesthesia Procedure Notes (Signed)
Procedure Name: Intubation Date/Time: 02/20/2018 1:02 PM Performed by: [place] Inger Wiest C, CRNA Pre-anesthesia Checklist: Patient identified, Emergency Drugs available, Suction available and Patient being monitored Patient Re-evaluated:Patient Re-evaluated prior to induction Oxygen Delivery Method: Circle system utilized Preoxygenation: Pre-oxygenation with 100% oxygen Induction Type: IV induction Ventilation: Mask ventilation without difficulty Laryngoscope Size: Mac and 4 Grade View: Grade I Tube type: Oral Tube size: 7.5 mm Number of attempts: 1 Airway Equipment and Method: Stylet and Oral airway Placement Confirmation: ETT inserted through vocal cords under direct vision,  positive ETCO2 and breath sounds checked- equal and bilateral Secured at: 21 cm Tube secured with: Tape Dental Injury: Teeth and Oropharynx as per pre-operative assessment

## 2018-02-21 ENCOUNTER — Encounter (HOSPITAL_COMMUNITY): Payer: Self-pay | Admitting: General Surgery

## 2018-02-21 LAB — CBC
HCT: 36.2 % — ABNORMAL LOW (ref 39.0–52.0)
Hemoglobin: 12 g/dL — ABNORMAL LOW (ref 13.0–17.0)
MCH: 31.3 pg (ref 26.0–34.0)
MCHC: 33.1 g/dL (ref 30.0–36.0)
MCV: 94.3 fL (ref 80.0–100.0)
Platelets: 150 10*3/uL (ref 150–400)
RBC: 3.84 MIL/uL — ABNORMAL LOW (ref 4.22–5.81)
RDW: 12.6 % (ref 11.5–15.5)
WBC: 4.8 10*3/uL (ref 4.0–10.5)
nRBC: 0 % (ref 0.0–0.2)

## 2018-02-21 LAB — COMPREHENSIVE METABOLIC PANEL
ALT: 21 U/L (ref 0–44)
AST: 17 U/L (ref 15–41)
Albumin: 3 g/dL — ABNORMAL LOW (ref 3.5–5.0)
Alkaline Phosphatase: 47 U/L (ref 38–126)
Anion gap: 7 (ref 5–15)
BUN: 30 mg/dL — ABNORMAL HIGH (ref 8–23)
CO2: 25 mmol/L (ref 22–32)
Calcium: 8.7 mg/dL — ABNORMAL LOW (ref 8.9–10.3)
Chloride: 105 mmol/L (ref 98–111)
Creatinine, Ser: 1.34 mg/dL — ABNORMAL HIGH (ref 0.61–1.24)
GFR calc Af Amer: 56 mL/min — ABNORMAL LOW (ref >60)
GFR calc non Af Amer: 48 mL/min — ABNORMAL LOW (ref >60)
Glucose, Bld: 116 mg/dL — ABNORMAL HIGH (ref 70–99)
Potassium: 4.1 mmol/L (ref 3.5–5.1)
Sodium: 137 mmol/L (ref 135–145)
Total Bilirubin: 1.3 mg/dL — ABNORMAL HIGH (ref 0.3–1.2)
Total Protein: 6 g/dL — ABNORMAL LOW (ref 6.5–8.1)

## 2018-02-21 MED ORDER — ACETAMINOPHEN 500 MG PO TABS: 1000.0000 mg | ORAL_TABLET | Freq: Four times a day (QID) | ORAL | Status: DC | PRN

## 2018-02-21 NOTE — Progress Notes (Signed)
PROGRESS NOTE    Joseph Pennington  IEP:[id number] DOB: [birth year] DOA: 02/19/2018 PCP: Joseph Frees, MD   Brief Narrative:  HPI On 02/19/2018 by Dr. Cristal Deer KOLTEN Pennington is a 84 y.o. male with medical history significant for hypertension who presents to the emergency department with onset of abdominal pain nausea and vomiting that started Thursday evening that progressively got worse so he came to the emergency room on seeing him now he said the abdominal pain is improved compared to what he was the abdominal pain has progressively worsened.  Abdominal pain is made worse by palpation movements.  He has had some chills but not sure of fever.  He denies chest pain shortness of breath headache blurry vision neck pain fever cough weakness numbness dizziness anorexia edema diarrhea back pain bloody emesis blood in stool,  Interim history Admitted for abdominal pain, found to have acute cholecystitis.  General surgery consulted, status post surgery.  General surgery assuming care of patient. Assessment & Plan   Abdominal pain secondary to Acute cholecystitis -Patient presented with discomfort and right upper quadrant tenderness -CT abdomen pelvis showed distended gallbladder with moderate diffuse gallbladder wall thickening, prominent pericholecystic stranding suggestive of acute cholecystitis. -RUQ ultrasound showed findings consistent with acute cholecystitis, large volume of sludge in the gallbladder and small gallstones -General surgery consulted and appreciated, status post scopic cholecystectomy-found to have gangrenous cholecystitis. -Started on clear liquids today -Currently on Zosyn.  Would recommend Augmentin once patient is able to tolerate diet.  Essential hypertension -BP stable -Currently on no medications  Leukocytosis -Suspect secondary to the above, has resolved  Mild hyponatremia -Likely secondary to volume loss and dehydration -Continue IVF, monitor  BMP  Acute kidney injury -Creatinine on admission 2.2, currently down to 1.34 -No labs in epic since 2010, at that time creatinine was 1.1-1.2 -Continue IVF and to monitor BMP  Infrarenal abdominal aortic aneurysm -Noted on CT abdomen pelvis, 3.1 cm -Commended follow-up aortic ultrasound in 3 years  DVT Prophylaxis  Heparin  Code Status: Full  Family Communication: None at bedside  Disposition Plan: Admitted.  General surgery has graciously excepted patient to their service.  [facility] will sign off.  Consultants General surgery  Procedures  RUQ [place] Laparoscopic cholecystectomy   Antibiotics   Anti-infectives (From admission, onward)   Start     Dose/Rate Route Frequency Ordered Stop   02/19/18 2200  piperacillin-tazobactam (ZOSYN) IVPB 3.375 g  Status:  Discontinued     3.375 g 100 mL/hr over 30 Minutes Intravenous Every 8 hours 02/19/18 2039 02/19/18 2128   02/19/18 2200  piperacillin-tazobactam (ZOSYN) IVPB 3.375 g     3.375 g 12.5 mL/hr over 240 Minutes Intravenous Every 8 hours 02/19/18 2128     02/19/18 1415  piperacillin-tazobactam (ZOSYN) IVPB 3.375 g     3.375 g 100 mL/hr over 30 Minutes Intravenous  Once 02/19/18 1412 02/19/18 1546      Subjective:   Joseph Pennington seen and examined today.  Denies any current pain, nausea or vomiting.  He would like to get up and walk around and possibly eat something.  Denies current chest pain, shortness of breath, diarrhea, constipation, dizziness or headache.  Objective:   Vitals:   02/20/18 1500 02/20/18 1515 02/20/18 2137 02/21/18 0545  BP: 103/83 130/69 112/67 130/71  Pulse: 82 78 78 71  Resp: 17 19 20 16   Temp:  98.4 F (36.9 C) 98.1 F (36.7 C) 98 F (36.7 C)  TempSrc:   Oral Oral  SpO2: 100% 99% 95% 96%  Weight:      Height:        Intake/Output Summary (Last 24 hours) at 02/21/2018 1233 Last data filed at 02/21/2018 1113 Gross per 24 hour  Intake 1760 ml  Output 1140 ml  Net 620 ml   Filed Weights    02/19/18 2100  Weight: 99.2 kg   Exam  General: Well developed, well nourished, NAD, appears stated age  59: NCAT, mucous membranes moist.   Neck: Supple  Cardiovascular: S1 S2 auscultated, no murmur, RRR  Respiratory: Clear to auscultation bilaterally with equal chest rise  Abdomen: Soft, nontender, mildly distended, + few bowel sounds, drain in place, incisions clean and dry  Extremities: warm dry without cyanosis clubbing or edema  Neuro: AAOx3, nonfocal  Psych: Normal affect and demeanor with intact judgement and insight   Data Reviewed: I have personally reviewed following labs and imaging studies  CBC: Recent Labs  Lab 02/19/18 1043 02/20/18 0542 02/21/18 0606  WBC 15.2* 7.3 4.8  NEUTROABS 12.9*  --   --   HGB 15.8 13.9 12.0*  HCT 46.9 41.1 36.2*  MCV 92.9 93.8 94.3  PLT 193 168 568   Basic Metabolic Panel: Recent Labs  Lab 02/19/18 1043 02/20/18 0542 02/21/18 0606  NA 131* 133* 137  K 4.1 4.2 4.1  CL 94* 100 105  CO2 26 22 25   GLUCOSE 171* 139* 116*  BUN 45* 47* 30*  CREATININE 2.20* 1.57* 1.34*  CALCIUM 9.8 8.6* 8.7*   GFR: Estimated Creatinine Clearance: 50.8 mL/min (A) (by C-G formula based on SCr of 1.34 mg/dL (H)). Liver Function Tests: Recent Labs  Lab 02/19/18 1043 02/20/18 0542 02/21/18 0606  AST 21 18 17   ALT 31 20 21   ALKPHOS 69 51 47  BILITOT 2.5* 2.2* 1.3*  PROT 8.3* 6.3* 6.0*  ALBUMIN 4.2 3.1* 3.0*   Recent Labs  Lab 02/19/18 1043  LIPASE 22   No results for input(s): AMMONIA in the last 168 hours. Coagulation Profile: No results for input(s): INR, PROTIME in the last 168 hours. Cardiac Enzymes: No results for input(s): CKTOTAL, CKMB, CKMBINDEX, TROPONINI in the last 168 hours. BNP (last 3 results) No results for input(s): PROBNP in the last 8760 hours. HbA1C: No results for input(s): HGBA1C in the last 72 hours. CBG: No results for input(s): GLUCAP in the last 168 hours. Lipid Profile: No results for  input(s): CHOL, HDL, LDLCALC, TRIG, CHOLHDL, LDLDIRECT in the last 72 hours. Thyroid Function Tests: No results for input(s): TSH, T4TOTAL, FREET4, T3FREE, THYROIDAB in the last 72 hours. Anemia Panel: No results for input(s): VITAMINB12, FOLATE, FERRITIN, TIBC, IRON, RETICCTPCT in the last 72 hours. Urine analysis:    Component Value Date/Time   COLORURINE YELLOW 02/20/2018 0315   APPEARANCEUR HAZY (A) 02/20/2018 0315   LABSPEC 1.020 02/20/2018 0315   PHURINE 5.0 02/20/2018 0315   GLUCOSEU NEGATIVE 02/20/2018 0315   HGBUR NEGATIVE 02/20/2018 0315   BILIRUBINUR NEGATIVE 02/20/2018 0315   KETONESUR NEGATIVE 02/20/2018 0315   PROTEINUR 30 (A) 02/20/2018 0315   UROBILINOGEN 1.0 10/06/2008 1450   NITRITE NEGATIVE 02/20/2018 0315   LEUKOCYTESUR NEGATIVE 02/20/2018 0315   Sepsis Labs: @LABRCNTIP (procalcitonin:4,lacticidven:4)  ) Recent Results (from the past 240 hour(s))  MRSA PCR Screening     Status: None   Collection Time: 02/20/18  9:31 AM  Result Value Ref Range Status   MRSA by PCR NEGATIVE NEGATIVE Final    Comment:        The  GeneXpert MRSA Assay (FDA approved for NASAL specimens only), is one component of a comprehensive MRSA colonization surveillance program. It is not intended to diagnose MRSA infection nor to guide or monitor treatment for MRSA infections. Performed at [facility], [address] [place], [place] [address]       Radiology Studies: Ct Abdomen Pelvis Wo Contrast  Result Date: 02/19/2018 CLINICAL DATA:  Generalized abdominal pain and tenderness, most prominent in the right upper quadrant. Stage 3 chronic kidney disease. EXAM: CT ABDOMEN AND PELVIS WITHOUT CONTRAST TECHNIQUE: Multidetector CT imaging of the abdomen and pelvis was performed following the standard protocol without IV contrast. COMPARISON:  None. FINDINGS: Lower chest: Mild bibasilar scarring versus atelectasis. Oral contrast in the lower thoracic esophagus.  Hepatobiliary: Normal size liver. Subcentimeter hypodense lateral segment left liver lobe lesion is too small to characterize and requires no follow-up unless the patient has risk factors for liver malignancy. No additional liver lesions. Distended gallbladder. Moderate diffuse gallbladder wall thickening and pericholecystic fluid and fat haziness. No radiopaque cholelithiasis. No biliary ductal dilatation. Pancreas: Normal, with no mass or duct dilation. Spleen: Normal size. No mass. Adrenals/Urinary Tract: Normal adrenals. No renal stones. No hydronephrosis. No contour deforming renal masses. Normal bladder. Stomach/Bowel: Probable small hiatal hernia. Otherwise normal stomach. Diffuse mild to moderate small bowel dilatation with air-fluid levels throughout the small bowel. Oral contrast transits to proximal to mid small bowel. No small bowel wall thickening. No focal small bowel caliber transition. Appendix is within normal limits. Moderate right colonic dilatation with fluid levels, with no large bowel wall thickening. No significant colonic diverticulosis. Small left inguinal hernia contains a tiny portion of the sigmoid colon. Vascular/Lymphatic: Atherosclerotic abdominal aorta with 3.1 cm infrarenal abdominal aortic aneurysm. No pathologically enlarged lymph nodes in the abdomen or pelvis. Reproductive: Mildly enlarged prostate. Other: No pneumoperitoneum, ascites or focal fluid collection. Musculoskeletal: No aggressive appearing focal osseous lesions. Marked thoracolumbar spondylosis. IMPRESSION: 1. Distended gallbladder with moderate diffuse gallbladder wall thickening and prominent pericholecystic fat stranding, suggestive of acute cholecystitis. No radiopaque cholelithiasis. Suggest right upper quadrant ultrasound correlation. No biliary ductal dilatation. 2. Generalized small bowel and right colonic dilatation with fluid levels and no discrete bowel caliber transition. Findings favor a reactive adynamic  ileus to the gallbladder process. 3. Small left inguinal hernia contains a small portion of the sigmoid colon, without associated bowel complication in this location. 4. Oral contrast in the lower thoracic esophagus, suggesting esophageal dysmotility and/or gastroesophageal reflux. Probable small hiatal hernia. 5. Infrarenal 3.1 cm Abdominal Aortic Aneurysm (ICD10-I71.9). Recommend follow-up aortic ultrasound in 3 years. This recommendation follows ACR consensus guidelines: White Paper of the ACR Incidental Findings Committee II on Vascular Findings. J Am Coll Radiol 2013; 10:789-794. 6.  Aortic Atherosclerosis (ICD10-I70.0). Electronically Signed   By: Ilona Sorrel M.D.   On: 02/19/2018 13:55   US Abdomen Limited Ruq  Result Date: 02/19/2018 CLINICAL DATA:  Right upper quadrant pain. Elevated white blood cell count. EXAM: ULTRASOUND ABDOMEN LIMITED RIGHT UPPER QUADRANT COMPARISON:  None. FINDINGS: Gallbladder: There is a moderately large volume of sludge within the gallbladder and a few stones measuring up to approximately 0.8 cm. The gallbladder wall is thickened at 0.3 cm. There is some pericholecystic fluid present. Common bile duct: Diameter: 0.7 cm. Liver: No focal lesion identified. Within normal limits in parenchymal echogenicity. Portal vein is patent on color Doppler imaging with normal direction of blood flow towards the liver. IMPRESSION: Findings consistent with acute cholecystitis in this patient  with a large volume of sludge in the gallbladder and small gallstones. Electronically Signed   By: Inge Rise M.D.   On: 02/19/2018 15:36     Scheduled Meds: . heparin  5,000 Units Subcutaneous Q8H   Continuous Infusions: . sodium chloride 75 mL/hr at 02/21/18 0449  . piperacillin-tazobactam (ZOSYN)  IV 3.375 g (02/21/18 0634)     LOS: 2 days   Time Spent in minutes   30 minutes  Joseph Pennington D.O. on 02/21/2018 at 12:33 PM  Between 7am to 7pm - Please see pager noted on  [email or web address]  After 7pm go to [email or web address]  And look for the night coverage person covering for me after hours  [facility] Office  [phone]

## 2018-02-21 NOTE — Progress Notes (Signed)
Patient ID: Joseph Pennington, male   DOB: October 06, 1931, 84 y.o.   MRN: [id number]    1 Day Post-Op  Subjective: Patient feels very well.  Not having any pain.  Passing flatus and hungry.  Wanting to get up and mobilize.  Objective: Vital signs in last 24 hours: Temp:  [98 F (36.7 C)-98.4 F (36.9 C)] 98 F (36.7 C) (12/03 0545) Pulse Rate:  [71-84] 71 (12/03 0545) Resp:  [16-20] 16 (12/03 0545) BP: (103-136)/(67-83) 130/71 (12/03 0545) SpO2:  [95 %-100 %] 96 % (12/03 0545) Last BM Date: 02/20/18  Intake/Output from previous day: 12/02 0701 - 12/03 0700 In: 1520 [P.O.:320; I.V.:1200] Out: 1140 [Urine:975; Drains:165] Intake/Output this shift: No intake/output data recorded.  PE: Abd: soft, minimally tender around JP drain mostly, JP drain with just serosang output and some drainage around drain site.  All incisions are c/d/i.  +BS, less distended today  Lab Results:  Recent Labs    02/20/18 0542 02/21/18 0606  WBC 7.3 4.8  HGB 13.9 12.0*  HCT 41.1 36.2*  PLT 168 150   BMET Recent Labs    02/20/18 0542 02/21/18 0606  NA 133* 137  K 4.2 4.1  CL 100 105  CO2 22 25  GLUCOSE 139* 116*  BUN 47* 30*  CREATININE 1.57* 1.34*  CALCIUM 8.6* 8.7*   PT/INR No results for input(s): LABPROT, INR in the last 72 hours. CMP     Component Value Date/Time   NA 137 02/21/2018 0606   K 4.1 02/21/2018 0606   CL 105 02/21/2018 0606   CO2 25 02/21/2018 0606   GLUCOSE 116 (H) 02/21/2018 0606   BUN 30 (H) 02/21/2018 0606   CREATININE 1.34 (H) 02/21/2018 0606   CALCIUM 8.7 (L) 02/21/2018 0606   PROT 6.0 (L) 02/21/2018 0606   ALBUMIN 3.0 (L) 02/21/2018 0606   AST 17 02/21/2018 0606   ALT 21 02/21/2018 0606   ALKPHOS 47 02/21/2018 0606   BILITOT 1.3 (H) 02/21/2018 0606   GFRNONAA 48 (L) 02/21/2018 0606   GFRAA 56 (L) 02/21/2018 0606   Lipase     Component Value Date/Time   LIPASE 22 02/19/2018 1043       Studies/Results: Ct Abdomen Pelvis Wo Contrast  Result  Date: 02/19/2018 CLINICAL DATA:  Generalized abdominal pain and tenderness, most prominent in the right upper quadrant. Stage 3 chronic kidney disease. EXAM: CT ABDOMEN AND PELVIS WITHOUT CONTRAST TECHNIQUE: Multidetector CT imaging of the abdomen and pelvis was performed following the standard protocol without IV contrast. COMPARISON:  None. FINDINGS: Lower chest: Mild bibasilar scarring versus atelectasis. Oral contrast in the lower thoracic esophagus. Hepatobiliary: Normal size liver. Subcentimeter hypodense lateral segment left liver lobe lesion is too small to characterize and requires no follow-up unless the patient has risk factors for liver malignancy. No additional liver lesions. Distended gallbladder. Moderate diffuse gallbladder wall thickening and pericholecystic fluid and fat haziness. No radiopaque cholelithiasis. No biliary ductal dilatation. Pancreas: Normal, with no mass or duct dilation. Spleen: Normal size. No mass. Adrenals/Urinary Tract: Normal adrenals. No renal stones. No hydronephrosis. No contour deforming renal masses. Normal bladder. Stomach/Bowel: Probable small hiatal hernia. Otherwise normal stomach. Diffuse mild to moderate small bowel dilatation with air-fluid levels throughout the small bowel. Oral contrast transits to proximal to mid small bowel. No small bowel wall thickening. No focal small bowel caliber transition. Appendix is within normal limits. Moderate right colonic dilatation with fluid levels, with no large bowel wall thickening. No significant colonic diverticulosis. Small  left inguinal hernia contains a tiny portion of the sigmoid colon. Vascular/Lymphatic: Atherosclerotic abdominal aorta with 3.1 cm infrarenal abdominal aortic aneurysm. No pathologically enlarged lymph nodes in the abdomen or pelvis. Reproductive: Mildly enlarged prostate. Other: No pneumoperitoneum, ascites or focal fluid collection. Musculoskeletal: No aggressive appearing focal osseous lesions.  Marked thoracolumbar spondylosis. IMPRESSION: 1. Distended gallbladder with moderate diffuse gallbladder wall thickening and prominent pericholecystic fat stranding, suggestive of acute cholecystitis. No radiopaque cholelithiasis. Suggest right upper quadrant ultrasound correlation. No biliary ductal dilatation. 2. Generalized small bowel and right colonic dilatation with fluid levels and no discrete bowel caliber transition. Findings favor a reactive adynamic ileus to the gallbladder process. 3. Small left inguinal hernia contains a small portion of the sigmoid colon, without associated bowel complication in this location. 4. Oral contrast in the lower thoracic esophagus, suggesting esophageal dysmotility and/or gastroesophageal reflux. Probable small hiatal hernia. 5. Infrarenal 3.1 cm Abdominal Aortic Aneurysm (ICD10-I71.9). Recommend follow-up aortic ultrasound in 3 years. This recommendation follows ACR consensus guidelines: White Paper of the ACR Incidental Findings Committee II on Vascular Findings. J Am Coll Radiol 2013; 10:789-794. 6.  Aortic Atherosclerosis (ICD10-I70.0). Electronically Signed   By: Ilona Sorrel M.D.   On: 02/19/2018 13:55   US Abdomen Limited Ruq  Result Date: 02/19/2018 CLINICAL DATA:  Right upper quadrant pain. Elevated white blood cell count. EXAM: ULTRASOUND ABDOMEN LIMITED RIGHT UPPER QUADRANT COMPARISON:  None. FINDINGS: Gallbladder: There is a moderately large volume of sludge within the gallbladder and a few stones measuring up to approximately 0.8 cm. The gallbladder wall is thickened at 0.3 cm. There is some pericholecystic fluid present. Common bile duct: Diameter: 0.7 cm. Liver: No focal lesion identified. Within normal limits in parenchymal echogenicity. Portal vein is patent on color Doppler imaging with normal direction of blood flow towards the liver. IMPRESSION: Findings consistent with acute cholecystitis in this patient with a large volume of sludge in the  gallbladder and small gallstones. Electronically Signed   By: Inge Rise M.D.   On: 02/19/2018 15:36    Anti-infectives: Anti-infectives (From admission, onward)   Start     Dose/Rate Route Frequency Ordered Stop   02/19/18 2200  piperacillin-tazobactam (ZOSYN) IVPB 3.375 g  Status:  Discontinued     3.375 g 100 mL/hr over 30 Minutes Intravenous Every 8 hours 02/19/18 2039 02/19/18 2128   02/19/18 2200  piperacillin-tazobactam (ZOSYN) IVPB 3.375 g     3.375 g 12.5 mL/hr over 240 Minutes Intravenous Every 8 hours 02/19/18 2128     02/19/18 1415  piperacillin-tazobactam (ZOSYN) IVPB 3.375 g     3.375 g 100 mL/hr over 30 Minutes Intravenous  Once 02/19/18 1412 02/19/18 1546       Assessment/Plan  POD 1, s/p lap chole for gangrenous cholecystitis, Dr. Excell Seltzer 02-20-18 -clear liquids today and adv diet as tolerates to low fat diet. -mobilze and pulm toilet -will need a total of 7 days of post op abx therapy.  On zosyn for now, when tolerating oral diet can transition to augmentin. -cont JP drian for now, will see if this can be removed prior to DC or next week in our office. -hopefully if tolerates diet advancement today and does well, he can go home tomorrow.   FEN - IVFs/CLD VTE - Heparin/SCDs ID - zosyn 12/1-->   LOS: 2 days    Henreitta Cea , [facility] 02/21/2018, 9:57 AM Pager: [phone]

## 2018-02-22 LAB — BASIC METABOLIC PANEL
Anion gap: 7 (ref 5–15)
BUN: 22 mg/dL (ref 8–23)
CO2: 26 mmol/L (ref 22–32)
Calcium: 8.8 mg/dL — ABNORMAL LOW (ref 8.9–10.3)
Chloride: 104 mmol/L (ref 98–111)
Creatinine, Ser: 1.17 mg/dL (ref 0.61–1.24)
GFR calc Af Amer: >60 mL/min (ref >60)
GFR calc non Af Amer: 57 mL/min — ABNORMAL LOW (ref >60)
Glucose, Bld: 109 mg/dL — ABNORMAL HIGH (ref 70–99)
Potassium: 4.1 mmol/L (ref 3.5–5.1)
Sodium: 137 mmol/L (ref 135–145)

## 2018-02-22 MED ORDER — AMOXICILLIN-POT CLAVULANATE 875-125 MG PO TABS: 1.0000 | ORAL_TABLET | Freq: Two times a day (BID) | ORAL | 0 refills | Status: AC

## 2018-02-22 NOTE — Discharge Summary (Signed)
Patient ID: TRASK VOSLER [id number] [birth year] 84 y.o.  Admit date: 02/19/2018 Discharge date: 02/22/2018  Admitting Diagnosis: Acute cholecystitis HTN  Discharge Diagnosis Patient Active Problem List   Diagnosis Date Noted  . Gangrenous cholecystitis 02/19/2018    Consultants General surgery  Reason for Admission: ADRIANO BISCHOF is a 84 y.o. male with medical history significant for hypertension who presents to the emergency department with onset of abdominal pain nausea and vomiting that started Thursday evening that progressively got worse so he came to the emergency room on seeing him now he said the abdominal pain is improved compared to what he was the abdominal pain has progressively worsened.  Abdominal pain is made worse by palpation movements.  He has had some chills but not sure of fever.  He denies chest pain shortness of breath headache blurry vision neck pain fever cough weakness numbness dizziness anorexia edema diarrhea back pain bloody emesis blood in stool.  Procedures Lap chole with JP drain placement, Dr. Excell Seltzer 02/20/18  Hospital Course:  The patient was admitted with an elevated TB of 2.5 and a CT that reveals pericholecystic inflammation but no gallstones.  An [place] was obtained which revealed multiple gallstones and cholecystitis.  General surgery was consulted at the time of admission.  After his [place] finding, he was taken to the OR where he underwent the above procedure.  He was noted to have gangrenous cholecystitis.  An endoloop was placed around a somewhat necrotic cystic duct.  A JP drain was placed to follow post operative output.  He was continued on his zosyn and plans for a total of 7 days postoperatively.  His diet was able to be advanced as tolerated post op.  He had minimal pain and only took tylenol.  He mobilized well and was voiding well with good flatus.  On POD 2, he was stable for DC home with his drain in place.  He will return early next  week for drain evaluation and removal if no evidence of bile is present.    Physical Exam: Heart: regular Lungs: CTAB Abd: soft, minimally tender around the drain.  Drain with about 5cc of serosang output.  Drain teaching was done with the patient as well.  ND, incisions are all c/d/i with dermabond.  Allergies as of 02/22/2018   No Known Allergies     Medication List    TAKE these medications   acetaminophen 500 MG tablet Commonly known as:  TYLENOL Take 1,000 mg by mouth daily as needed for moderate pain.   amoxicillin-clavulanate 875-125 MG tablet Commonly known as:  AUGMENTIN Take 1 tablet by mouth 2 (two) times daily for 5 days.   aspirin EC 81 MG tablet Take 81 mg by mouth daily.   cyanocobalamin 1000 MCG/ML injection Commonly known as:  (VITAMIN B-12) Inject 1,000 mcg into the muscle every 30 (thirty) days.   famotidine 10 MG tablet Commonly known as:  PEPCID Take 10 mg by mouth daily as needed for heartburn or indigestion.   lisinopril 20 MG tablet Commonly known as:  PRINIVIL,ZESTRIL Take 20 mg by mouth daily.        Follow-up Information    Sur[facility], [place] Follow up on 02/28/2018.   Specialty:  General Surgery Why:  This appointment is to see a nurse only to have your drain removed. 2:00pm, arrive by 1:45pm for check in. Contact information: [address] [address] [phone]  Carlena Hurl, PA-C Follow up on 03/07/2018.   Specialty:  General Surgery Why:  10:00am, arrive by 9:30 am for paperwork and check in Contact information: [place] [address] [phone]           Signed: Saverio Danker, [facility] 02/22/2018, 9:17 AM Pager: [phone]

## 2018-02-22 NOTE — Discharge Instructions (Signed)
Empty and recharge your JP drain as needed.  Record output and bring it with you to your follow up appointment.  Please call us if output changes and appears green like bile  [facility] ______[facility] LAPAROSCOPIC SURGERY: POST OP INSTRUCTIONS Always review your discharge instruction sheet given to you by the facility where your surgery was performed. IF YOU HAVE DISABILITY OR FAMILY LEAVE FORMS, YOU MUST BRING THEM TO THE OFFICE FOR PROCESSING.   DO NOT GIVE THEM TO YOUR DOCTOR.  1. A prescription for pain medication may be given to you upon discharge.  Take your pain medication as prescribed, if needed.  If narcotic pain medicine is not needed, then you may take acetaminophen (Tylenol) or ibuprofen (Advil) as needed. 2. Take your usually prescribed medications unless otherwise directed. 3. If you need a refill on your pain medication, please contact your pharmacy.  They will contact our office to request authorization. Prescriptions will not be filled after 5pm or on week-ends. 4. You should follow a light diet the first few days after arrival home, such as soup and crackers, etc.  Be sure to include lots of fluids daily. 5. Most patients will experience some swelling and bruising in the area of the incisions.  Ice packs will help.  Swelling and bruising can take several days to resolve.  6. It is common to experience some constipation if taking pain medication after surgery.  Increasing fluid intake and taking a stool softener (such as Colace) will usually help or prevent this problem from occurring.  A mild laxative (Milk of Magnesia or Miralax) should be taken according to package instructions if there are no bowel movements after 48 hours. 7. Unless discharge instructions indicate otherwise, you may remove your bandages 24-48 hours after surgery, and you may shower at that time.  You may have steri-strips (small skin tapes) in place directly over the incision.  These strips should  be left on the skin for 7-10 days.  If your surgeon used skin glue on the incision, you may shower in 24 hours.  The glue will flake off over the next 2-3 weeks.  Any sutures or staples will be removed at the office during your follow-up visit. 8. ACTIVITIES:  You may resume regular (light) daily activities beginning the next day--such as daily self-care, walking, climbing stairs--gradually increasing activities as tolerated.  You may have sexual intercourse when it is comfortable.  Refrain from any heavy lifting or straining until approved by your doctor. a. You may drive when you are no longer taking prescription pain medication, you can comfortably wear a seatbelt, and you can safely maneuver your car and apply brakes. b. RETURN TO WORK:  __________________________________________________________ 9. You should see your doctor in the office for a follow-up appointment approximately 2-3 weeks after your surgery.  Make sure that you call for this appointment within a day or two after you arrive home to insure a convenient appointment time. 10. OTHER INSTRUCTIONS: __________________________________________________________________________________________________________________________ __________________________________________________________________________________________________________________________ WHEN TO CALL YOUR DOCTOR: 1. Fever over 101.0 2. Inability to urinate 3. Continued bleeding from incision. 4. Increased pain, redness, or drainage from the incision. 5. Increasing abdominal pain  The clinic staff is available to answer your questions during regular business hours.  Please dont hesitate to call and ask to speak to one of the nurses for clinical concerns.  If you have a medical emergency, go to the nearest emergency room or call 911.  A surgeon from [facility] is always  on call at the hospital. [address] [place], [place], [place]  [address] ? [address],  [place], [place]   [address] [phone] ? [phone] ? FAX [phone] Web site: [email or web address]

## 2018-02-22 NOTE — Care Management Note (Signed)
Case Management Note  Patient Details  Name: SHERYL TOWELL MRN: [id number] Date of Birth: [birth year]  Subjective/Objective:                  Discharge planning  Action/Plan: Discharged to home with self-care and family support Orders checked for hhc needs. No CM needs present at time of discharge. Patient prefers to make own follow appointments has needed.  Expected Discharge Date:  02/22/18               Expected Discharge Plan:  Home/Self Care  In-House Referral:     Discharge planning Services  CM Consult  Post Acute Care Choice:    Choice offered to:     DME Arranged:    DME Agency:     HH Arranged:    HH Agency:     Status of Service:  Completed, signed off  If discussed at [employer] of Stay Meetings, dates discussed:    Additional Comments:  Leeroy Cha, RN 02/22/2018, 11:24 AM

## 2018-07-14 DIAGNOSIS — N183 Chronic kidney disease, stage 3 (moderate): Secondary | ICD-10-CM | POA: Diagnosis not present

## 2018-07-14 DIAGNOSIS — I10 Essential (primary) hypertension: Secondary | ICD-10-CM | POA: Diagnosis not present

## 2018-07-14 DIAGNOSIS — E119 Type 2 diabetes mellitus without complications: Secondary | ICD-10-CM | POA: Diagnosis not present

## 2018-07-14 DIAGNOSIS — E538 Deficiency of other specified B group vitamins: Secondary | ICD-10-CM | POA: Diagnosis not present

## 2018-09-15 DIAGNOSIS — H2513 Age-related nuclear cataract, bilateral: Secondary | ICD-10-CM | POA: Diagnosis not present

## 2018-09-15 DIAGNOSIS — H40033 Anatomical narrow angle, bilateral: Secondary | ICD-10-CM | POA: Diagnosis not present

## 2018-09-15 DIAGNOSIS — H40023 Open angle with borderline findings, high risk, bilateral: Secondary | ICD-10-CM | POA: Diagnosis not present

## 2019-01-18 DIAGNOSIS — Z794 Long term (current) use of insulin: Secondary | ICD-10-CM | POA: Diagnosis not present

## 2019-01-18 DIAGNOSIS — I10 Essential (primary) hypertension: Secondary | ICD-10-CM | POA: Diagnosis not present

## 2019-01-18 DIAGNOSIS — E119 Type 2 diabetes mellitus without complications: Secondary | ICD-10-CM | POA: Diagnosis not present

## 2019-01-18 DIAGNOSIS — Z0000 Encounter for general adult medical examination without abnormal findings: Secondary | ICD-10-CM | POA: Diagnosis not present

## 2019-01-18 DIAGNOSIS — Z23 Encounter for immunization: Secondary | ICD-10-CM | POA: Diagnosis not present

## 2019-01-18 DIAGNOSIS — E538 Deficiency of other specified B group vitamins: Secondary | ICD-10-CM | POA: Diagnosis not present

## 2019-01-18 DIAGNOSIS — N1831 Chronic kidney disease, stage 3a: Secondary | ICD-10-CM | POA: Diagnosis not present

## 2019-01-19 IMAGING — CT CT ABD-PELV W/O CM
2 of 4 series · 15 of 46 positions shown, 17 images · non-contrast
Comparison: None.

CLINICAL DATA: Generalized abdominal pain and tenderness, most
prominent in the right upper quadrant. Stage 3 chronic kidney
disease.

EXAM:
CT ABDOMEN AND PELVIS WITHOUT CONTRAST
TECHNIQUE: Multidetector CT imaging of the abdomen and pelvis was performed
following the standard protocol without IV contrast.

[Series 2: axial st · axial · 0.87mm/px · z∈[-504,-39]mm · 12 of 105 slices shown, 14 images]
[im 6/105  soft-tissue]
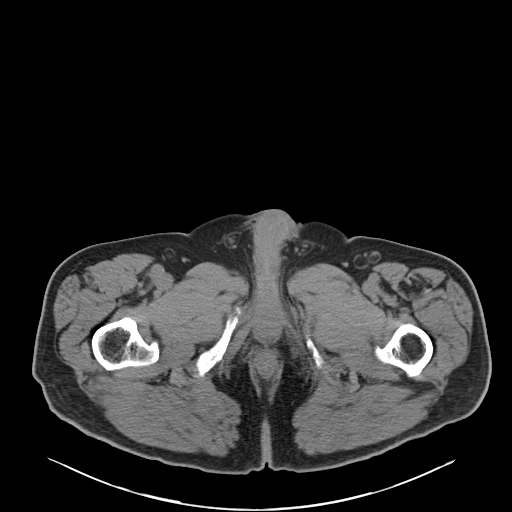
[im 6/105  bone]
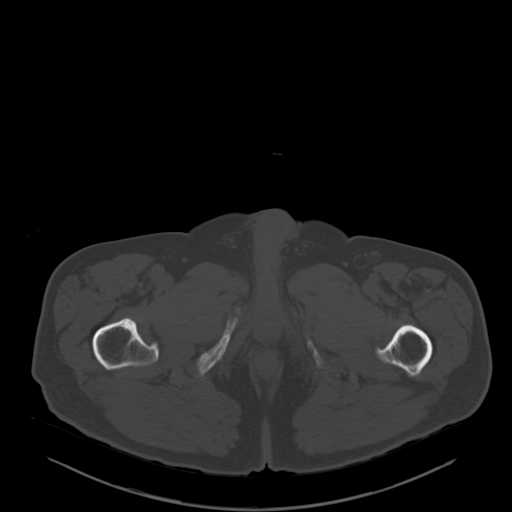
[im 16/105  soft-tissue]
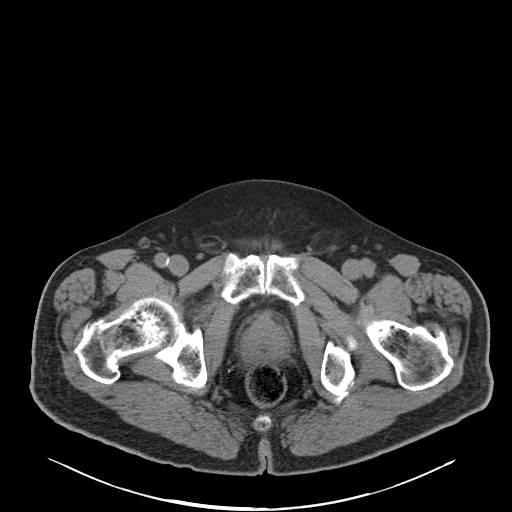
[im 21/105  soft-tissue]
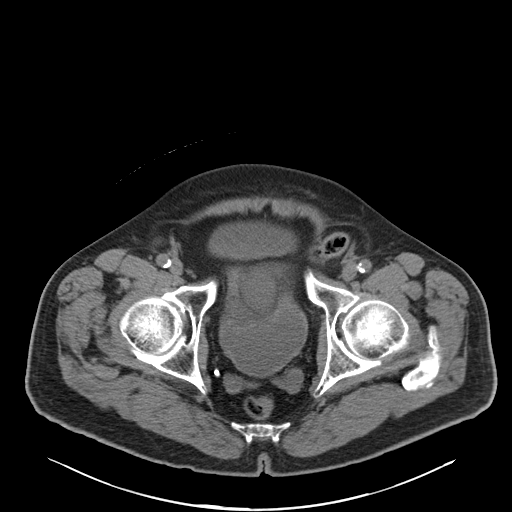
[im 32/105  soft-tissue]
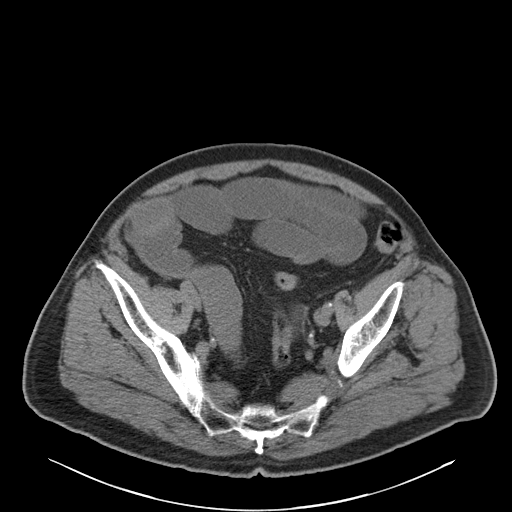
[im 42/105  soft-tissue]
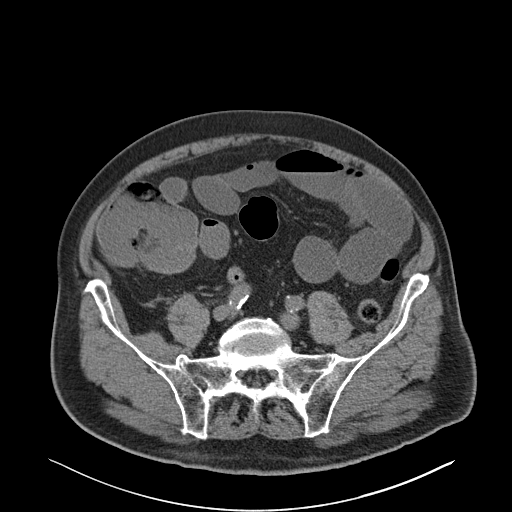
[im 47/105  soft-tissue]
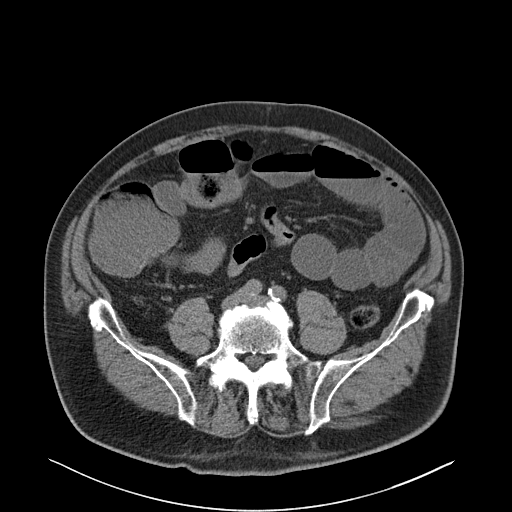
[im 58/105  soft-tissue]
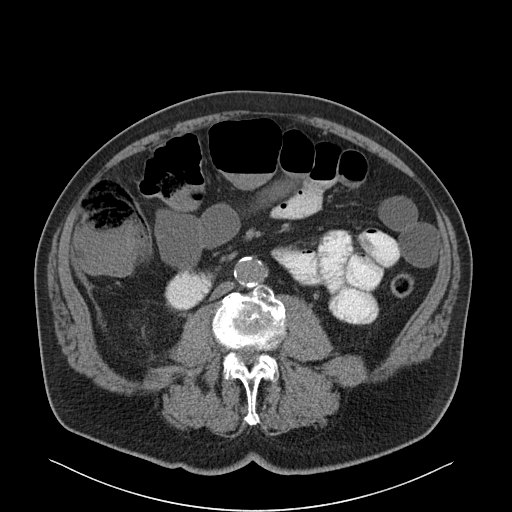
[im 63/105  soft-tissue]
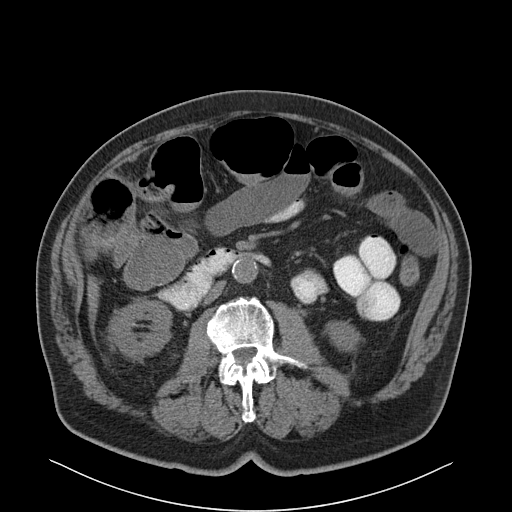
[im 73/105  soft-tissue]
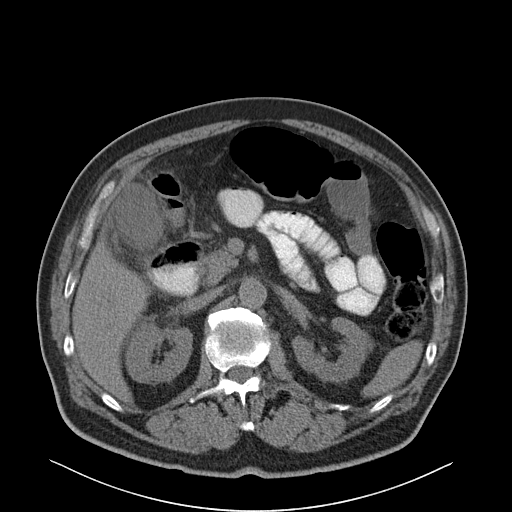
[im 73/105  bone]
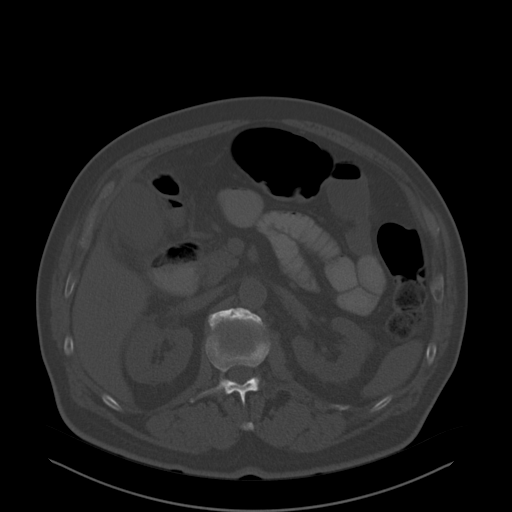
[im 84/105  soft-tissue]
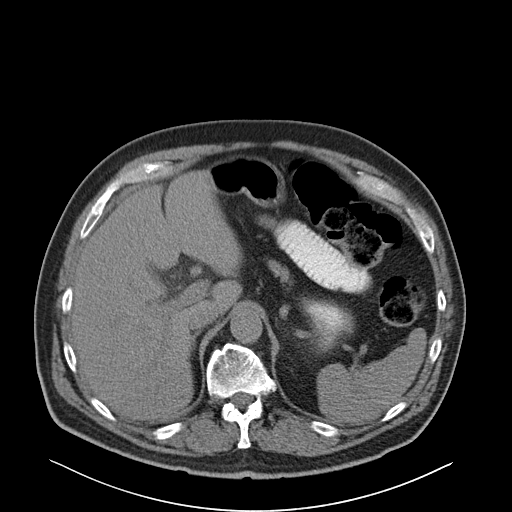
[im 89/105  soft-tissue]
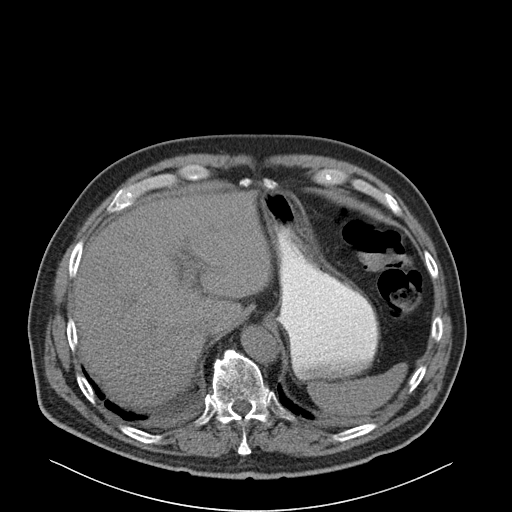
[im 99/105  soft-tissue]
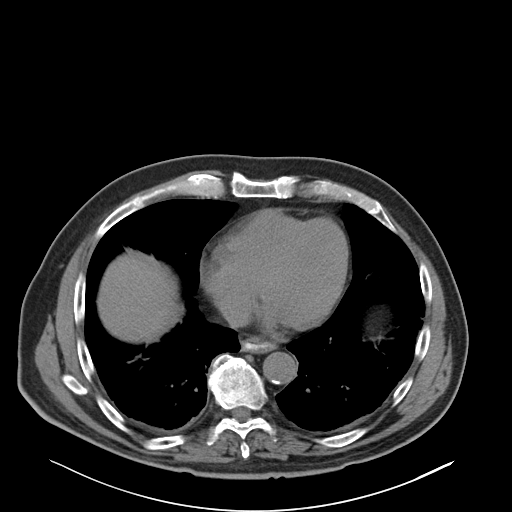

[Series 4: coronal st · coronal · 0.82mm/px · 3 of 114 slices shown]
[im 38/114  soft-tissue]
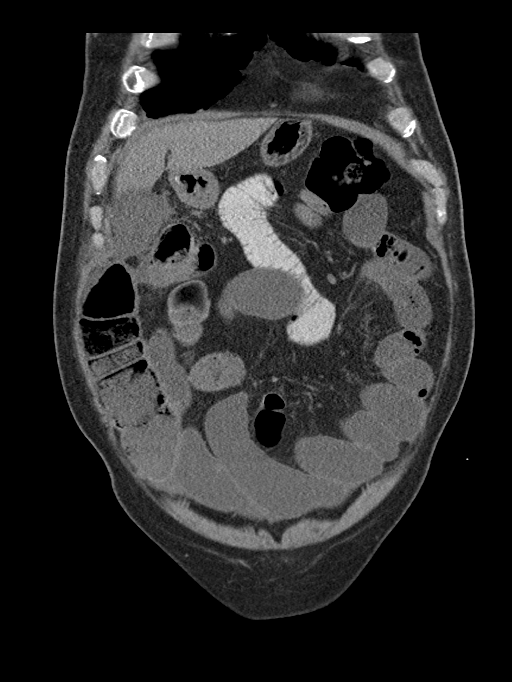
[im 51/114  soft-tissue]
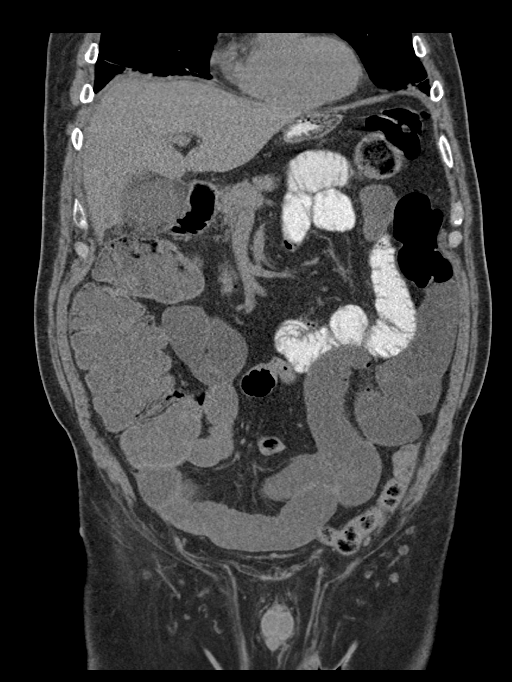
[im 63/114  soft-tissue]
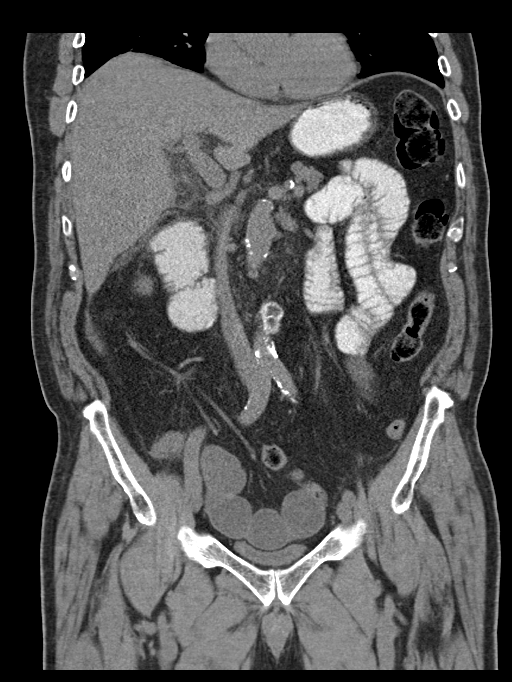

[15 of 46 positions shown; findings below may reference images not displayed]

FINDINGS: Lower chest: Mild bibasilar scarring versus atelectasis. Oral
contrast in the lower thoracic esophagus.

Hepatobiliary: Normal size liver. Subcentimeter hypodense lateral
segment left liver lobe lesion is too small to characterize and
requires no follow-up unless the patient has risk factors for liver
malignancy. No additional liver lesions. Distended gallbladder.
Moderate diffuse gallbladder wall thickening and pericholecystic
fluid and fat haziness. No radiopaque cholelithiasis. No biliary
ductal dilatation.

Pancreas: Normal, with no mass or duct dilation.

Spleen: Normal size. No mass.

Adrenals/Urinary Tract: Normal adrenals. No renal stones. No
hydronephrosis. No contour deforming renal masses. Normal bladder.

Stomach/Bowel: Probable small hiatal hernia. Otherwise normal
stomach. Diffuse mild to moderate small bowel dilatation with
air-fluid levels throughout the small bowel. Oral contrast transits
to proximal to mid small bowel. No small bowel wall thickening. No
focal small bowel caliber transition. Appendix is within normal
limits. Moderate right colonic dilatation with fluid levels, with no
large bowel wall thickening. No significant colonic diverticulosis.
Small left inguinal hernia contains a tiny portion of the sigmoid
colon.

Vascular/Lymphatic: Atherosclerotic abdominal aorta with 3.1 cm
infrarenal abdominal aortic aneurysm. No pathologically enlarged
lymph nodes in the abdomen or pelvis.

Reproductive: Mildly enlarged prostate.

Other: No pneumoperitoneum, ascites or focal fluid collection.

Musculoskeletal: No aggressive appearing focal osseous lesions.
Marked thoracolumbar spondylosis.
IMPRESSION: 1. Distended gallbladder with moderate diffuse gallbladder wall
thickening and prominent pericholecystic fat stranding, suggestive
of acute cholecystitis. No radiopaque cholelithiasis. Suggest right
upper quadrant ultrasound correlation. No biliary ductal dilatation.
2. Generalized small bowel and right colonic dilatation with fluid
levels and no discrete bowel caliber transition. Findings favor a
reactive adynamic ileus to the gallbladder process.
3. Small left inguinal hernia contains a small portion of the
sigmoid colon, without associated bowel complication in this
location.
4. Oral contrast in the lower thoracic esophagus, suggesting
esophageal dysmotility and/or gastroesophageal reflux. Probable
small hiatal hernia.
5. Infrarenal 3.1 cm Abdominal Aortic Aneurysm (APL75-HCA.J).
Recommend follow-up aortic ultrasound in 3 years. This
recommendation follows ACR consensus guidelines: White Paper of the
ACR Incidental Findings Committee II on Vascular Findings. [HOSPITAL] 2758; [DATE].
6.  Aortic Atherosclerosis (APL75-HN3.3).

## 2019-01-19 IMAGING — US US ABDOMEN LIMITED
1 series · 14 of 25 positions shown · non-contrast
Comparison: None.

CLINICAL DATA: Right upper quadrant pain. Elevated white blood cell
count.

EXAM:
ULTRASOUND ABDOMEN LIMITED RIGHT UPPER QUADRANT

[Series 1: us abdomen limited · 14 of 53 slices shown]
[im 1/53]
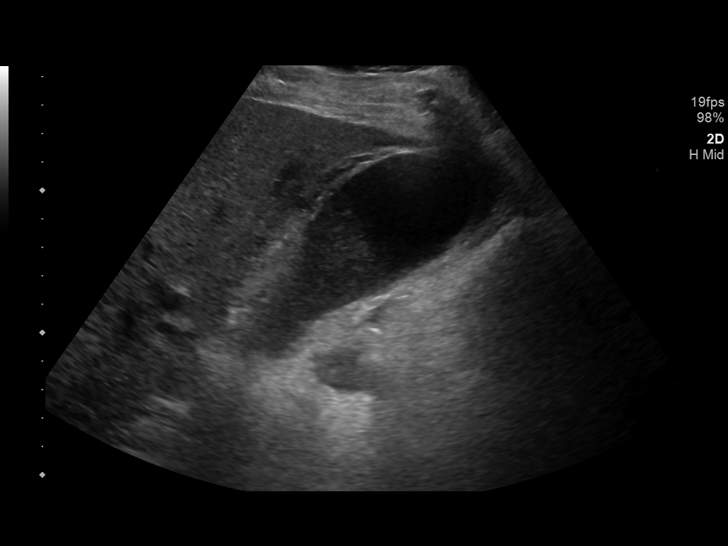
[im 5/53]
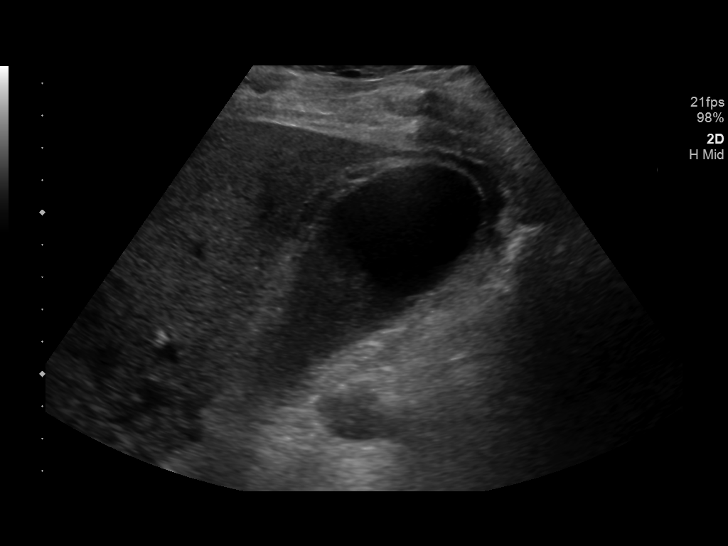
[im 9/53]
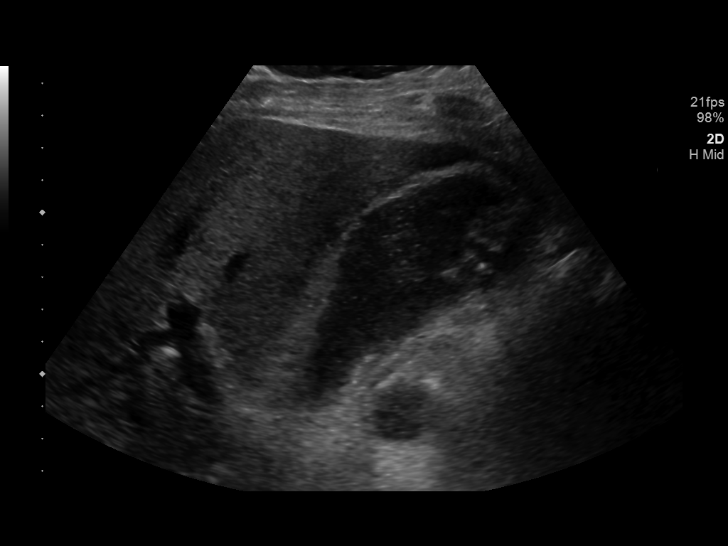
[im 14/53]
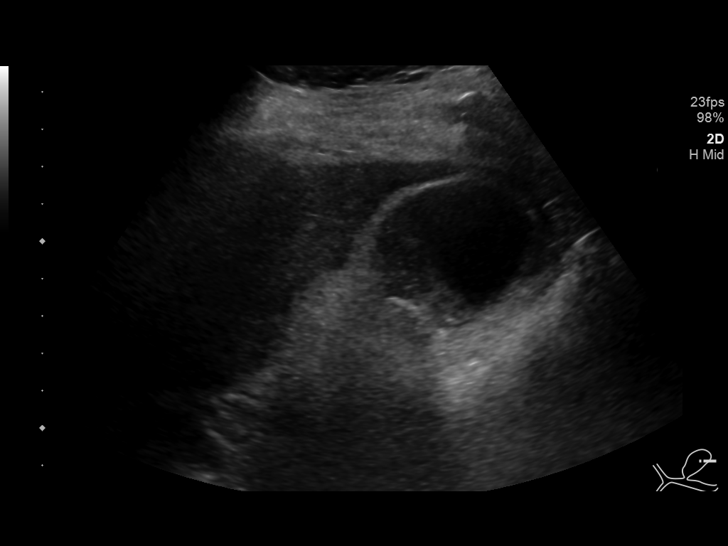
[im 18/53]
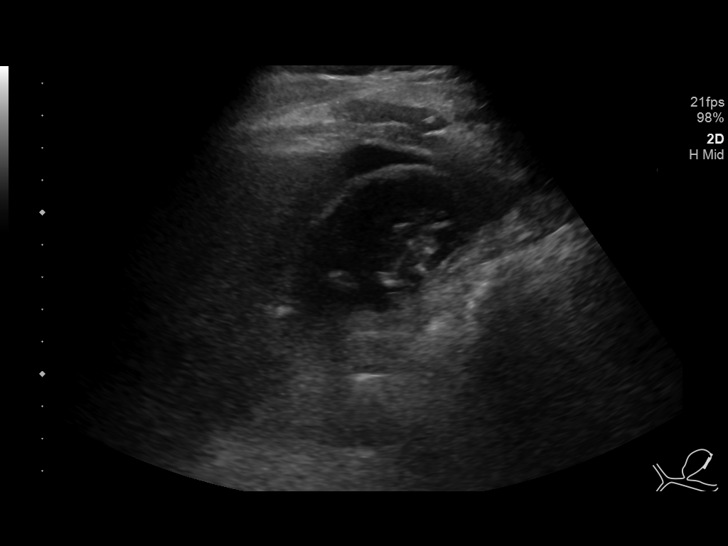
[im 20/53]
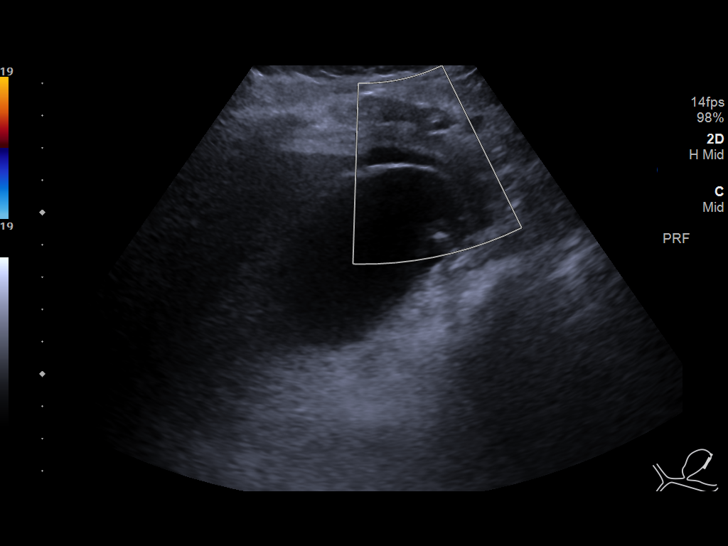
[im 24/53]
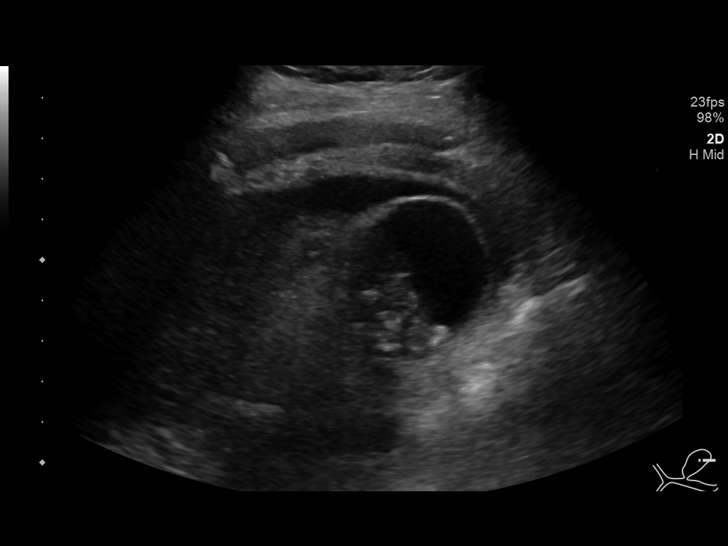
[im 29/53]
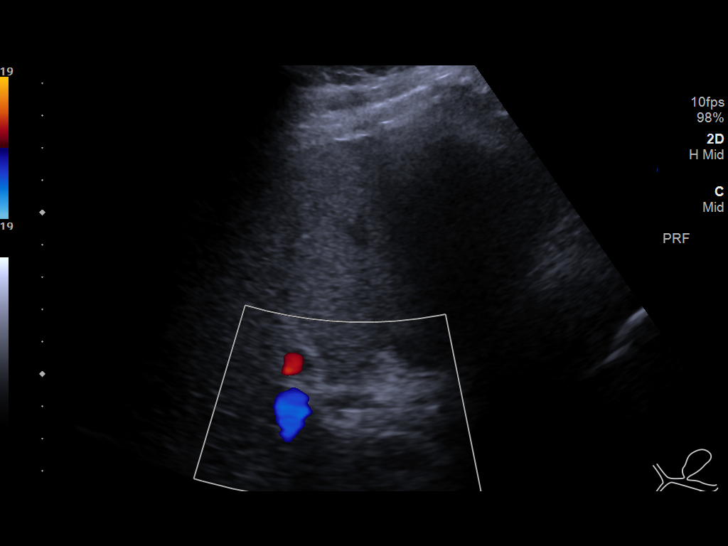
[im 33/53]
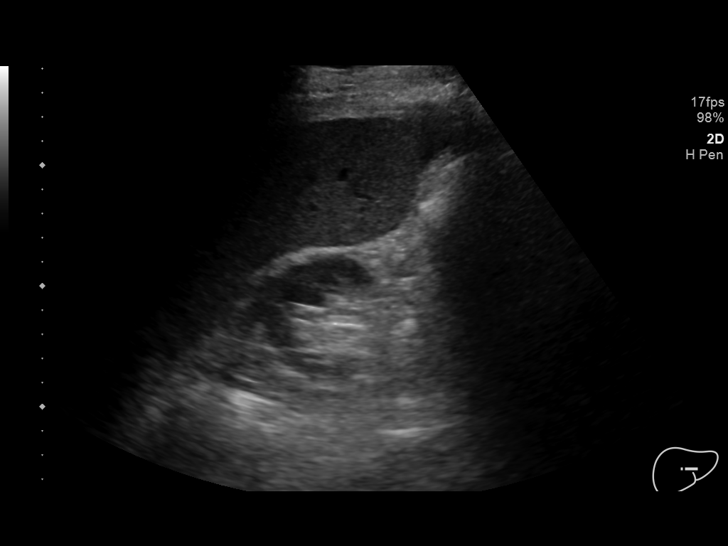
[im 35/53]
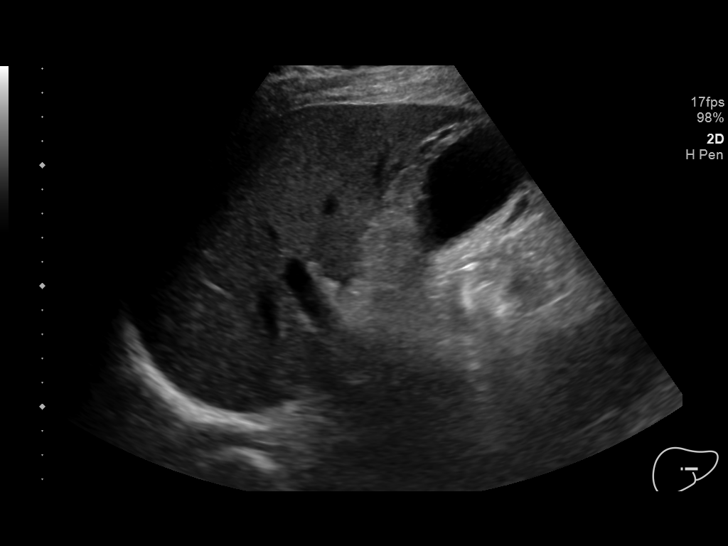
[im 40/53]
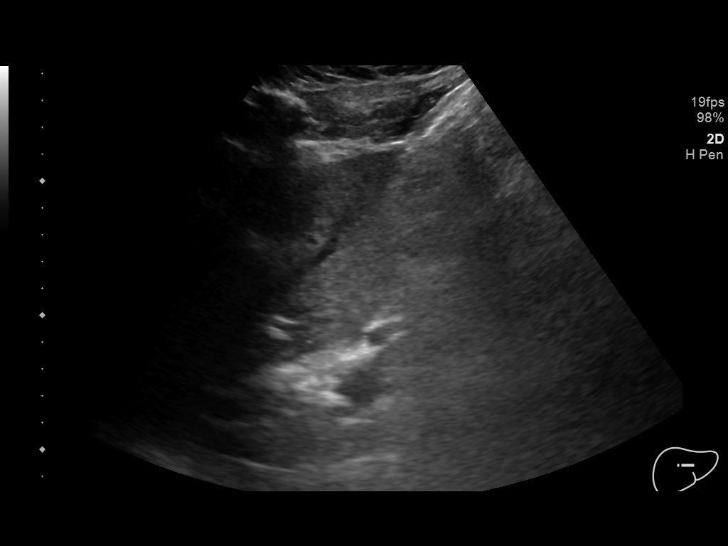
[im 44/53]
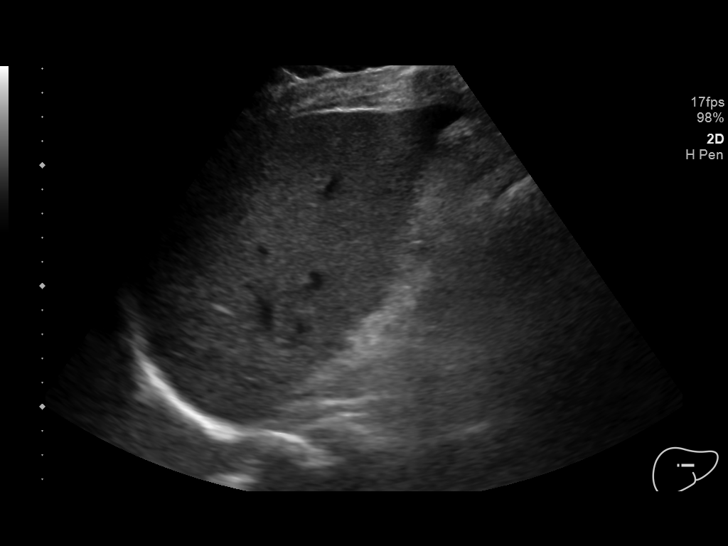
[im 48/53]
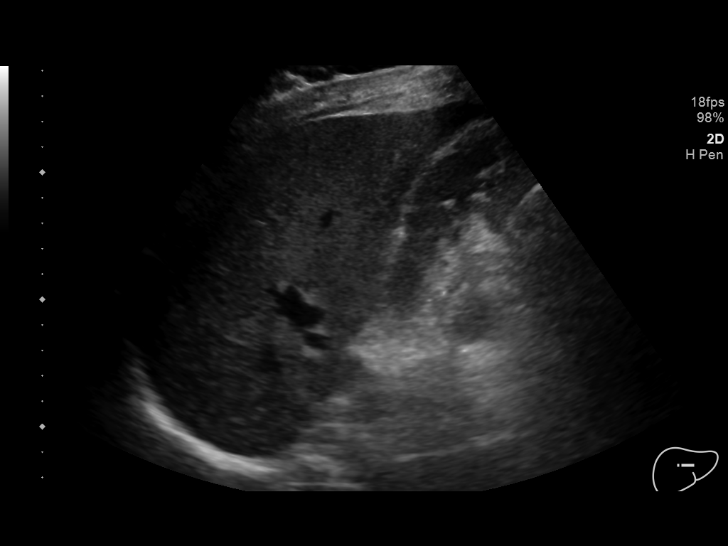
[im 53/53]
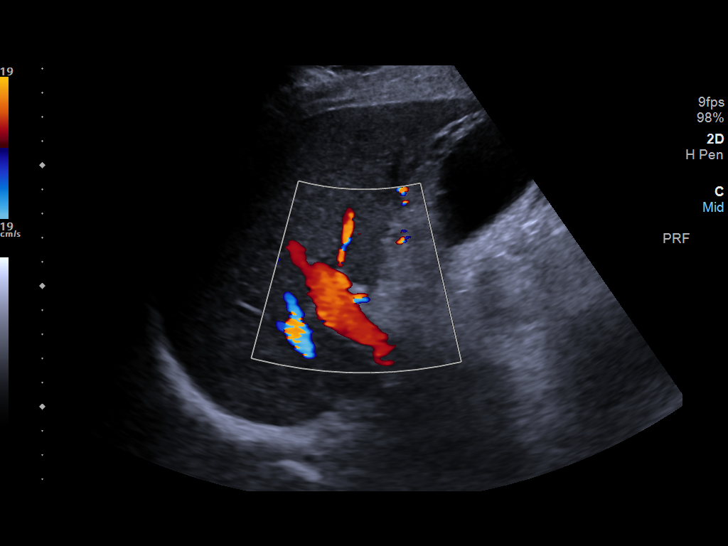

[14 of 25 positions shown; findings below may reference images not displayed]

FINDINGS: Gallbladder:

There is a moderately large volume of sludge within the gallbladder
and a few stones measuring up to approximately 0.8 cm. The
gallbladder wall is thickened at 0.3 cm. There is some
pericholecystic fluid present.

Common bile duct:

Diameter: 0.7 cm.

Liver:

No focal lesion identified. Within normal limits in parenchymal
echogenicity. Portal vein is patent on color Doppler imaging with
normal direction of blood flow towards the liver.
IMPRESSION: Findings consistent with acute cholecystitis in this patient with a
large volume of sludge in the gallbladder and small gallstones..

## 2019-02-05 DIAGNOSIS — R946 Abnormal results of thyroid function studies: Secondary | ICD-10-CM | POA: Diagnosis not present

## 2019-03-30 DIAGNOSIS — H25013 Cortical age-related cataract, bilateral: Secondary | ICD-10-CM | POA: Diagnosis not present

## 2019-03-30 DIAGNOSIS — H40023 Open angle with borderline findings, high risk, bilateral: Secondary | ICD-10-CM | POA: Diagnosis not present

## 2019-03-30 DIAGNOSIS — H40033 Anatomical narrow angle, bilateral: Secondary | ICD-10-CM | POA: Diagnosis not present

## 2019-03-30 DIAGNOSIS — H2511 Age-related nuclear cataract, right eye: Secondary | ICD-10-CM | POA: Diagnosis not present

## 2019-03-30 DIAGNOSIS — H2513 Age-related nuclear cataract, bilateral: Secondary | ICD-10-CM | POA: Diagnosis not present

## 2019-04-18 DIAGNOSIS — H2511 Age-related nuclear cataract, right eye: Secondary | ICD-10-CM | POA: Diagnosis not present

## 2019-04-18 DIAGNOSIS — H25811 Combined forms of age-related cataract, right eye: Secondary | ICD-10-CM | POA: Diagnosis not present

## 2019-04-24 DIAGNOSIS — H25012 Cortical age-related cataract, left eye: Secondary | ICD-10-CM | POA: Diagnosis not present

## 2019-04-24 DIAGNOSIS — H25042 Posterior subcapsular polar age-related cataract, left eye: Secondary | ICD-10-CM | POA: Diagnosis not present

## 2019-04-24 DIAGNOSIS — H2512 Age-related nuclear cataract, left eye: Secondary | ICD-10-CM | POA: Diagnosis not present

## 2019-05-02 DIAGNOSIS — H25042 Posterior subcapsular polar age-related cataract, left eye: Secondary | ICD-10-CM | POA: Diagnosis not present

## 2019-05-02 DIAGNOSIS — H25012 Cortical age-related cataract, left eye: Secondary | ICD-10-CM | POA: Diagnosis not present

## 2019-05-02 DIAGNOSIS — H2512 Age-related nuclear cataract, left eye: Secondary | ICD-10-CM | POA: Diagnosis not present

## 2019-05-02 DIAGNOSIS — H25812 Combined forms of age-related cataract, left eye: Secondary | ICD-10-CM | POA: Diagnosis not present

## 2019-05-16 DIAGNOSIS — E039 Hypothyroidism, unspecified: Secondary | ICD-10-CM | POA: Diagnosis not present

## 2019-05-25 ENCOUNTER — Other Ambulatory Visit: Payer: Self-pay

## 2019-05-25 ENCOUNTER — Ambulatory Visit: Payer: Medicare Other | Attending: Internal Medicine

## 2019-05-25 DIAGNOSIS — Z23 Encounter for immunization: Secondary | ICD-10-CM

## 2019-05-25 NOTE — Progress Notes (Signed)
   Covid-19 Vaccination Clinic  Name:  Joseph Pennington    MRN: [id number] DOB: 1931/05/04  05/25/2019  [name] was observed post Covid-19 immunization for 15 minutes without incident. He was provided with Vaccine Information Sheet and instruction to access the V-Safe system.   [name] was instructed to call 911 with any severe reactions post vaccine: [name] Difficulty breathing  . Swelling of face and throat  . A fast heartbeat  . A bad rash all over body  . Dizziness and weakness

## 2019-06-15 ENCOUNTER — Ambulatory Visit: Payer: Medicare Other | Attending: Internal Medicine

## 2019-06-15 DIAGNOSIS — Z23 Encounter for immunization: Secondary | ICD-10-CM

## 2019-06-15 NOTE — Progress Notes (Signed)
   Covid-19 Vaccination Clinic  Name:  Joseph Pennington    MRN: [id number] DOB: 09-Jun-1931  06/15/2019  Mr. Joseph Pennington was observed post Covid-19 immunization for 15 minutes without incident. He was provided with Vaccine Information Sheet and instruction to access the V-Safe system.   Mr. Joseph Pennington was instructed to call 911 with any severe reactions post vaccine: [name] Difficulty breathing  . Swelling of face and throat  . A fast heartbeat  . A bad rash all over body  . Dizziness and weakness   Immunizations Administered    Name Date Dose VIS Date Route   Pfizer COVID-19 Vaccine 06/15/2019 11:30 AM 0.3 mL 03/02/2019 Intramuscular   Manufacturer: [employer]   Lot: [id number]   [place]: [id number]

## 2019-12-04 DIAGNOSIS — H40023 Open angle with borderline findings, high risk, bilateral: Secondary | ICD-10-CM | POA: Diagnosis not present

## 2019-12-04 DIAGNOSIS — R7309 Other abnormal glucose: Secondary | ICD-10-CM | POA: Diagnosis not present

## 2019-12-04 DIAGNOSIS — H04123 Dry eye syndrome of bilateral lacrimal glands: Secondary | ICD-10-CM | POA: Diagnosis not present

## 2019-12-04 DIAGNOSIS — H26491 Other secondary cataract, right eye: Secondary | ICD-10-CM | POA: Diagnosis not present

## 2020-01-11 DIAGNOSIS — Z23 Encounter for immunization: Secondary | ICD-10-CM | POA: Diagnosis not present

## 2020-01-21 DIAGNOSIS — E039 Hypothyroidism, unspecified: Secondary | ICD-10-CM | POA: Diagnosis not present

## 2020-01-21 DIAGNOSIS — Z0000 Encounter for general adult medical examination without abnormal findings: Secondary | ICD-10-CM | POA: Diagnosis not present

## 2020-01-21 DIAGNOSIS — I10 Essential (primary) hypertension: Secondary | ICD-10-CM | POA: Diagnosis not present

## 2020-01-21 DIAGNOSIS — N183 Chronic kidney disease, stage 3 unspecified: Secondary | ICD-10-CM | POA: Diagnosis not present

## 2020-01-21 DIAGNOSIS — E119 Type 2 diabetes mellitus without complications: Secondary | ICD-10-CM | POA: Diagnosis not present

## 2020-02-07 DIAGNOSIS — Z23 Encounter for immunization: Secondary | ICD-10-CM | POA: Diagnosis not present

## 2020-05-05 DIAGNOSIS — E039 Hypothyroidism, unspecified: Secondary | ICD-10-CM | POA: Diagnosis not present

## 2020-05-11 DIAGNOSIS — S0502XA Injury of conjunctiva and corneal abrasion without foreign body, left eye, initial encounter: Secondary | ICD-10-CM | POA: Diagnosis not present

## 2020-05-11 DIAGNOSIS — T1512XA Foreign body in conjunctival sac, left eye, initial encounter: Secondary | ICD-10-CM | POA: Diagnosis not present

## 2020-06-03 DIAGNOSIS — H26491 Other secondary cataract, right eye: Secondary | ICD-10-CM | POA: Diagnosis not present

## 2020-06-03 DIAGNOSIS — H40023 Open angle with borderline findings, high risk, bilateral: Secondary | ICD-10-CM | POA: Diagnosis not present

## 2020-06-03 DIAGNOSIS — Z961 Presence of intraocular lens: Secondary | ICD-10-CM | POA: Diagnosis not present

## 2020-10-21 ENCOUNTER — Encounter: Payer: Self-pay | Admitting: Gastroenterology

## 2020-12-02 DIAGNOSIS — H26491 Other secondary cataract, right eye: Secondary | ICD-10-CM | POA: Diagnosis not present

## 2020-12-02 DIAGNOSIS — H40023 Open angle with borderline findings, high risk, bilateral: Secondary | ICD-10-CM | POA: Diagnosis not present

## 2020-12-02 DIAGNOSIS — H35033 Hypertensive retinopathy, bilateral: Secondary | ICD-10-CM | POA: Diagnosis not present

## 2020-12-02 DIAGNOSIS — R7309 Other abnormal glucose: Secondary | ICD-10-CM | POA: Diagnosis not present

## 2021-01-15 DIAGNOSIS — Z23 Encounter for immunization: Secondary | ICD-10-CM | POA: Diagnosis not present

## 2021-02-02 DIAGNOSIS — N183 Chronic kidney disease, stage 3 unspecified: Secondary | ICD-10-CM | POA: Diagnosis not present

## 2021-02-02 DIAGNOSIS — I10 Essential (primary) hypertension: Secondary | ICD-10-CM | POA: Diagnosis not present

## 2021-02-02 DIAGNOSIS — E119 Type 2 diabetes mellitus without complications: Secondary | ICD-10-CM | POA: Diagnosis not present

## 2021-02-02 DIAGNOSIS — Z0000 Encounter for general adult medical examination without abnormal findings: Secondary | ICD-10-CM | POA: Diagnosis not present

## 2021-02-02 DIAGNOSIS — K625 Hemorrhage of anus and rectum: Secondary | ICD-10-CM | POA: Diagnosis not present

## 2021-02-02 DIAGNOSIS — E039 Hypothyroidism, unspecified: Secondary | ICD-10-CM | POA: Diagnosis not present

## 2021-02-02 DIAGNOSIS — E538 Deficiency of other specified B group vitamins: Secondary | ICD-10-CM | POA: Diagnosis not present

## 2021-03-05 DIAGNOSIS — Z23 Encounter for immunization: Secondary | ICD-10-CM | POA: Diagnosis not present

## 2021-03-05 DIAGNOSIS — U071 COVID-19: Secondary | ICD-10-CM | POA: Diagnosis not present

## 2021-03-10 ENCOUNTER — Encounter: Payer: Self-pay | Admitting: Family Medicine

## 2021-03-22 HISTORY — PX: COLONOSCOPY: SHX174

## 2021-04-21 ENCOUNTER — Encounter: Payer: Self-pay | Admitting: Internal Medicine

## 2021-05-13 ENCOUNTER — Other Ambulatory Visit (INDEPENDENT_AMBULATORY_CARE_PROVIDER_SITE_OTHER): Payer: Medicare Other

## 2021-05-13 ENCOUNTER — Encounter: Payer: Self-pay | Admitting: Internal Medicine

## 2021-05-13 ENCOUNTER — Ambulatory Visit (INDEPENDENT_AMBULATORY_CARE_PROVIDER_SITE_OTHER): Payer: Medicare Other | Admitting: Internal Medicine

## 2021-05-13 VITALS — BP 160/76 | HR 64 | Ht 74.0 in | Wt 221.0 lb

## 2021-05-13 DIAGNOSIS — R1013 Epigastric pain: Secondary | ICD-10-CM

## 2021-05-13 DIAGNOSIS — K625 Hemorrhage of anus and rectum: Secondary | ICD-10-CM

## 2021-05-13 DIAGNOSIS — R6889 Other general symptoms and signs: Secondary | ICD-10-CM

## 2021-05-13 DIAGNOSIS — D518 Other vitamin B12 deficiency anemias: Secondary | ICD-10-CM

## 2021-05-13 DIAGNOSIS — K921 Melena: Secondary | ICD-10-CM

## 2021-05-13 DIAGNOSIS — D649 Anemia, unspecified: Secondary | ICD-10-CM

## 2021-05-13 LAB — FOLATE: Folate: 11.8 ng/mL (ref >5.9)

## 2021-05-13 LAB — CBC WITH DIFFERENTIAL/PLATELET
Basophils Absolute: 0 10*3/uL (ref 0.0–0.1)
Basophils Relative: 0.5 % (ref 0.0–3.0)
Eosinophils Absolute: 0.1 10*3/uL (ref 0.0–0.7)
Eosinophils Relative: 1.2 % (ref 0.0–5.0)
HCT: 33.6 % — ABNORMAL LOW (ref 39.0–52.0)
Hemoglobin: 11 g/dL — ABNORMAL LOW (ref 13.0–17.0)
Lymphocytes Relative: 12.6 % (ref 12.0–46.0)
Lymphs Abs: 1 10*3/uL (ref 0.7–4.0)
MCHC: 32.8 g/dL (ref 30.0–36.0)
MCV: 83.8 fl (ref 78.0–100.0)
Monocytes Absolute: 0.6 10*3/uL (ref 0.1–1.0)
Monocytes Relative: 7.8 % (ref 3.0–12.0)
Neutro Abs: 6.1 10*3/uL (ref 1.4–7.7)
Neutrophils Relative %: 77.9 % — ABNORMAL HIGH (ref 43.0–77.0)
Platelets: 259 10*3/uL (ref 150.0–400.0)
RBC: 4.01 Mil/uL — ABNORMAL LOW (ref 4.22–5.81)
RDW: 15.7 % — ABNORMAL HIGH (ref 11.5–15.5)
WBC: 7.8 10*3/uL (ref 4.0–10.5)

## 2021-05-13 LAB — IBC + FERRITIN
Ferritin: 46.2 ng/mL (ref 22.0–322.0)
Iron: 34 ug/dL — ABNORMAL LOW (ref 42–165)
Saturation Ratios: 10 % — ABNORMAL LOW (ref 20.0–50.0)
TIBC: 338.8 ug/dL (ref 250.0–450.0)
Transferrin: 242 mg/dL (ref 212.0–360.0)

## 2021-05-13 LAB — VITAMIN B12: Vitamin B-12: 275 pg/mL (ref 211–911)

## 2021-05-13 MED ORDER — OMEPRAZOLE 40 MG PO CPDR: 40.0000 mg | DELAYED_RELEASE_CAPSULE | Freq: Every day | ORAL | 3 refills | Status: DC

## 2021-05-13 NOTE — Progress Notes (Signed)
Chief Complaint: Melena, rectal bleeding  HPI : 88 year old male with history of DM, CKD, hypothyroidism, HTN presents with melena and hematochezia  In 01/2021, he had two episodes of black and red stools. He only saw a small amount of blood when he did have this bleeding episode. Has not continued to have any black stools after these 2 isolated episodes.  He states that he was eating some dark leafy greens at that time and thus suspects that his diet may have caused him to have darker stools.  His stools started getting looser in January 2023, which has been occurring with some upper ab pain. He had his levothyroxine dosage increased in 02/2021. His baby aspirin was stopped when he saw the black stools and red blood. Denies NSAIDs currently. He drinks prune juice every day for general health. He has been taking Pepto Bismol on occasion for loose stools, which does turn his stools black. Denies any constipation issues. He works out regularly. He has lost about 14 lbs over the last year which was partly intentional. Denies dysphagia, N&V, chest burning, regurgitation. Denies prior EGD.  Last colonoscopy was in 2012 with benign polyps. Having some upper abdominal discomfort. Mother had rectal cancer at 44 years old.  Patient is a tree farmer  Wt Readings from Last 3 Encounters:  05/13/21 221 lb (100.2 kg)  02/19/18 218 lb 11.1 oz (99.2 kg)   Past Medical History:  Diagnosis Date   Family history of adverse reaction to anesthesia    Hypertension    Past Surgical History:  Procedure Laterality Date   CHOLECYSTECTOMY N/A 02/20/2018   Procedure: LAPAROSCOPIC CHOLECYSTECTOMY;  Surgeon: Excell Seltzer, MD;  Location: [facility];  Service: General;  Laterality: N/A;   NO PAST SURGERIES     Family History  Problem Relation Age of Onset   Rectal cancer Mother    Heart attack Father    Social History   Tobacco Use   Smoking status: Former   Smokeless tobacco: Never  Scientific laboratory technician Use:  Never used  Substance Use Topics   Alcohol use: Not Currently   Drug use: Not Currently   Current Outpatient Medications  Medication Sig Dispense Refill   cyanocobalamin (,VITAMIN B-12,) 1000 MCG/ML injection Inject 1,000 mcg into the muscle every 30 (thirty) days.   0   levothyroxine (SYNTHROID) 75 MCG tablet Take 75 mcg by mouth daily.     lisinopril (PRINIVIL,ZESTRIL) 20 MG tablet Take 20 mg by mouth daily.  3   No current facility-administered medications for this visit.   No Known Allergies   Review of Systems: All systems reviewed and negative except where noted in HPI.   Physical Exam: BP (!) 160/76    Pulse 64    Ht 6\' 2"  (1.88 m)    Wt 221 lb (100.2 kg)    BMI 28.37 kg/m  Constitutional: Pleasant,well-developed, male in no acute distress. HEENT: Normocephalic and atraumatic. Conjunctivae are normal. No scleral icterus. Cardiovascular: Normal rate, regular rhythm.  Pulmonary/chest: Effort normal and breath sounds normal. No wheezing, rales or rhonchi. Abdominal: Soft, nondistended, tender in the lower abdomen. Bowel sounds active throughout. There are no masses palpable. No hepatomegaly. Extremities: No edema Neurological: Alert and oriented to person place and time. Skin: Skin is warm and dry. No rashes noted. Psychiatric: Normal mood and affect. Behavior is normal.  Labs 01/2020: CBC with Hb of 14.8  Labs 01/2021: CBC with low Hb of 11.7 and plt 248. CMP  with mildly elevated Cr of 1.37. TSH elevated at 5.  Colonoscopy 05/14/10:  Path: HYPERPLASTIC POLYP. NO ADENOMATOUS CHANGE OR MALIGNANCY  ASSESSMENT AND PLAN:  Epigastric ab discomfort Possible episode of melena and rectal bleeding Anemia Patient presents with some symptoms of epigastric abdominal discomfort and an episode of melena/rectal bleeding that occurred in 01/2021.  His blood counts on his most recent recheck did demonstrate that he had a drop in his hemoglobin from 14.8 to 11.7.  We will plan to  recheck his blood counts today and assess for etiologies of anemia.  We will also go ahead and empirically start him on omeprazole 40 mg daily to see if this will help with his epigastric abdominal discomfort and anemia.  Patient may have had PUD or gastritis/duodenitis that led to some drop in his blood counts and to the abdominal discomfort.  After discussion with the patient, we will hold off on EGD and colonoscopy at this time and rediscuss in the future if necessary. - Check CBC, ferritin, iron/TIBC, vitamin B12, folate, reticulocyte - Start omeprazole 40 mg QD - RTC 2 months  Christia Reading, MD

## 2021-05-13 NOTE — Patient Instructions (Signed)
Your provider has requested that you go to the basement level for lab work before leaving today. Press "B" on the elevator. The lab is located at the first door on the left as you exit the elevator.   We have sent the following medications to your pharmacy for you to pick up at your convenience: Omeprazole 40 mg   Due to recent changes in healthcare laws, you may see the results of your imaging and laboratory studies on MyChart before your provider has had a chance to review them.  We understand that in some cases there may be results that are confusing or concerning to you. Not all laboratory results come back in the same time frame and the provider may be waiting for multiple results in order to interpret others.  Please give [place] 48 hours in order for your provider to thoroughly review all the results before contacting the office for clarification of your results.    If you are age 61 or older, your body mass index should be between 23-30. Your Body mass index is 28.37 kg/m. If this is out of the aforementioned range listed, please consider follow up with your Primary Care Provider.  If you are age 15 or younger, your body mass index should be between 19-25. Your Body mass index is 28.37 kg/m. If this is out of the aformentioned range listed, please consider follow up with your Primary Care Provider.   ________________________________________________________  The [facility] providers would like to encourage you to use [facility] to communicate with providers for non-urgent requests or questions.  Due to long hold times on the telephone, sending your provider a message by [facility] may be a faster and more efficient way to get a response.  Please allow 48 business hours for a response.  Please remember that this is for non-urgent requests.  _______________________________________________________   Thank you for choosing [facility]  Sonny Masters Dorsey,MD

## 2021-05-14 LAB — RETICULOCYTES
ABS Retic: 60300 cells/uL (ref 25000–90000)
Retic Ct Pct: 1.5 %

## 2021-05-15 NOTE — Progress Notes (Signed)
Hi Ammie, please let the patient know that his hemoglobin (a measure of his red blood cells) has dropped slightly. His labs are consistent with iron deficiency. He should start a daily iron supplement with ferrous sulfate 325 mg daily. Please let him know that the iron supplement can make his stools turn dark and can also cause him to become more constipated. He can take Miralax as needed to help with any constipation issues.

## 2021-05-18 ENCOUNTER — Other Ambulatory Visit: Payer: Self-pay

## 2021-05-18 DIAGNOSIS — D508 Other iron deficiency anemias: Secondary | ICD-10-CM

## 2021-05-18 DIAGNOSIS — K5909 Other constipation: Secondary | ICD-10-CM

## 2021-05-18 MED ORDER — POLYETHYLENE GLYCOL 3350 17 GM/SCOOP PO POWD: 17.0000 g | ORAL | Status: DC | PRN

## 2021-05-18 MED ORDER — IRON (FERROUS SULFATE) 325 (65 FE) MG PO TABS: 325.0000 mg | ORAL_TABLET | Freq: Every day | ORAL | Status: DC

## 2021-05-19 ENCOUNTER — Telehealth: Payer: Self-pay | Admitting: Internal Medicine

## 2021-06-02 DIAGNOSIS — H40023 Open angle with borderline findings, high risk, bilateral: Secondary | ICD-10-CM | POA: Diagnosis not present

## 2021-06-02 DIAGNOSIS — H04123 Dry eye syndrome of bilateral lacrimal glands: Secondary | ICD-10-CM | POA: Diagnosis not present

## 2021-06-24 ENCOUNTER — Encounter: Payer: Self-pay | Admitting: Internal Medicine

## 2021-06-24 ENCOUNTER — Other Ambulatory Visit (INDEPENDENT_AMBULATORY_CARE_PROVIDER_SITE_OTHER): Payer: Medicare Other

## 2021-06-24 ENCOUNTER — Ambulatory Visit (INDEPENDENT_AMBULATORY_CARE_PROVIDER_SITE_OTHER): Payer: Medicare Other | Admitting: Internal Medicine

## 2021-06-24 VITALS — BP 124/72 | HR 80 | Ht 74.0 in | Wt 217.0 lb

## 2021-06-24 DIAGNOSIS — R109 Unspecified abdominal pain: Secondary | ICD-10-CM

## 2021-06-24 DIAGNOSIS — D509 Iron deficiency anemia, unspecified: Secondary | ICD-10-CM | POA: Diagnosis not present

## 2021-06-24 LAB — CBC WITH DIFFERENTIAL/PLATELET
Basophils Absolute: 0 10*3/uL (ref 0.0–0.1)
Basophils Relative: 0.5 % (ref 0.0–3.0)
Eosinophils Absolute: 0.1 10*3/uL (ref 0.0–0.7)
Eosinophils Relative: 1.8 % (ref 0.0–5.0)
HCT: 33.7 % — ABNORMAL LOW (ref 39.0–52.0)
Hemoglobin: 11 g/dL — ABNORMAL LOW (ref 13.0–17.0)
Lymphocytes Relative: 20.7 % (ref 12.0–46.0)
Lymphs Abs: 1.3 10*3/uL (ref 0.7–4.0)
MCHC: 32.6 g/dL (ref 30.0–36.0)
MCV: 84.4 fl (ref 78.0–100.0)
Monocytes Absolute: 0.6 10*3/uL (ref 0.1–1.0)
Monocytes Relative: 10 % (ref 3.0–12.0)
Neutro Abs: 4.1 10*3/uL (ref 1.4–7.7)
Neutrophils Relative %: 67 % (ref 43.0–77.0)
Platelets: 286 10*3/uL (ref 150.0–400.0)
RBC: 3.99 Mil/uL — ABNORMAL LOW (ref 4.22–5.81)
RDW: 15.9 % — ABNORMAL HIGH (ref 11.5–15.5)
WBC: 6.2 10*3/uL (ref 4.0–10.5)

## 2021-06-24 LAB — IBC + FERRITIN
Ferritin: 38.8 ng/mL (ref 22.0–322.0)
Iron: 27 ug/dL — ABNORMAL LOW (ref 42–165)
Saturation Ratios: 8.3 % — ABNORMAL LOW (ref 20.0–50.0)
TIBC: 326.2 ug/dL (ref 250.0–450.0)
Transferrin: 233 mg/dL (ref 212.0–360.0)

## 2021-06-24 MED ORDER — OMEPRAZOLE 40 MG PO CPDR: 40.0000 mg | DELAYED_RELEASE_CAPSULE | Freq: Every day | ORAL | 3 refills | Status: DC

## 2021-06-24 NOTE — Patient Instructions (Signed)
If you are age 86 or older, your body mass index should be between 23-30. Your Body mass index is 27.86 kg/m?[name] If this is out of the aforementioned range listed, please consider follow up with your Primary Care Provider. ? ?If you are age 77 or younger, your body mass index should be between 19-25. Your Body mass index is 27.86 kg/m?[name] If this is out of the aformentioned range listed, please consider follow up with your Primary Care Provider.  ? ?Your provider has requested that you go to the basement level for lab work before leaving today. Press "B" on the elevator. The lab is located at the first door on the left as you exit the elevator. ? ?You have been scheduled for a colonoscopy. Please follow written instructions given to you at your visit today.  ?Please pick up your prep supplies at the pharmacy within the next 1-3 days. ?If you use inhalers (even only as needed), please bring them with you on the day of your procedure. ? ?Drop Ferrous sulfate to once daily.  ? ?The [facility] providers would like to encourage you to use [facility] to communicate with providers for non-urgent requests or questions.  Due to long hold times on the telephone, sending your provider a message by [facility] may be a faster and more efficient way to get a response.  Please allow 48 business hours for a response.  Please remember that this is for non-urgent requests.  ? ?It was a pleasure to see you today! ? ?Thank you for trusting me with your gastrointestinal care!   ? ?Christia Reading, MD  ? ?

## 2021-06-24 NOTE — Progress Notes (Signed)
? ?Chief Complaint: Melena, rectal bleeding ? ?HPI : 88 year old male with history of DM, CKD, hypothyroidism, HTN presents with melena and hematochezia ? ?Patient is a tree farmer ? ?Interval History: He has not noticed any additional bleeding. His stools are black from the iron supplements. The iron supplements have caused him to become more constipated. He used Miralax to help with the constipation but was started to notice some loose stools when he did take the MiraLAX.  He has not seen any additional bleeding like he saw from before. He has been taking his omeprazole 40 mg QD, which she does think has helped with some of his abdominal discomfort. However, he still notes that he has had some persistent abdominal discomfort in the right upper quadrant and epigastric area of his abdomen.  It seems that this pain gets worse when he becomes more constipated and improves when he has a bowel movement. ? ?Wt Readings from Last 3 Encounters:  ?06/24/21 217 lb (98.4 kg)  ?05/13/21 221 lb (100.2 kg)  ?02/19/18 218 lb 11.1 oz (99.2 kg)  ? ?Current Outpatient Medications  ?Medication Sig Dispense Refill  ? cyanocobalamin (,VITAMIN B-12,) 1000 MCG/ML injection Inject 1,000 mcg into the muscle every 30 (thirty) days.   0  ? Iron, Ferrous Sulfate, 325 (65 Fe) MG TABS Take 325 mg by mouth daily.    ? levothyroxine (SYNTHROID) 75 MCG tablet Take 75 mcg by mouth daily.    ? lisinopril (PRINIVIL,ZESTRIL) 20 MG tablet Take 20 mg by mouth daily.  3  ? polyethylene glycol powder (GLYCOLAX/MIRALAX) 17 GM/SCOOP powder Take 17 g by mouth as needed for mild constipation.    ? omeprazole (PRILOSEC) 40 MG capsule Take 1 capsule (40 mg total) by mouth daily. 30 minutes before meals 30 capsule 3  ? ?No current facility-administered medications for this visit.  ? ?Review of Systems: ?All systems reviewed and negative except where noted in HPI.  ? ?Physical Exam: ?BP 124/72   Pulse 80   Ht '6\' 2"'$  (1.88 m)   Wt 217 lb (98.4 kg)   BMI 27.86  kg/m?  ?Constitutional: Pleasant,well-developed, male in no acute distress. ?HEENT: Normocephalic and atraumatic. Conjunctivae are normal. No scleral icterus. ?Cardiovascular: Normal rate, regular rhythm.  ?Pulmonary/chest: Effort normal and breath sounds normal. No wheezing, rales or rhonchi. ?Abdominal: Soft, nondistended, non-tender. Bowel sounds active throughout. There are no masses palpable. No hepatomegaly. ?Extremities: No edema ?Neurological: Alert and oriented to person place and time. ?Skin: Skin is warm and dry. No rashes noted. ?Psychiatric: Normal mood and affect. Behavior is normal. ? ?Labs 01/2020: CBC with Hb of 14.8 ? ?Labs 01/2021: CBC with low Hb of 11.7 and plt 248. CMP with mildly elevated Cr of 1.37. TSH elevated at 5. ? ?Labs 04/2021: CBC with low Hb of 11. Iron saturation was low at 10%. Folate and vitamin B12 nml. Reticulocytes low ? ?Colonoscopy 05/14/10: ? ?Path: ?HYPERPLASTIC POLYP. NO ADENOMATOUS CHANGE OR MALIGNANCY ? ?ASSESSMENT AND PLAN: ? ?Epigastric/RUQ ab discomfort ?Possible episode of melena and rectal bleeding ?IDA ?Patient presents with some persistent abdominal discomfort.  His last CBC did show that he had a slight decrease in his hemoglobin from 11.7 in 01/2019 to 11 in 04/2021.  Since patient is otherwise relatively healthy, I discussed the risk and benefits of performing EGD and colonoscopy in the patient to rule out the possibility of malignancy.  Patient at this time is interested in proceeding with endoscopic procedures ?- Check CBC, ferritin, iron/TIBC ?-  Refill omeprazole 40 mg QD ?- EGD/colonoscopy [facility] ? ?Christia Reading, MD ? ?

## 2021-07-15 ENCOUNTER — Encounter: Payer: Self-pay | Admitting: Certified Registered Nurse Anesthetist

## 2021-07-23 ENCOUNTER — Encounter: Payer: Self-pay | Admitting: Internal Medicine

## 2021-07-23 ENCOUNTER — Other Ambulatory Visit (INDEPENDENT_AMBULATORY_CARE_PROVIDER_SITE_OTHER): Payer: Medicare Other

## 2021-07-23 ENCOUNTER — Ambulatory Visit (AMBULATORY_SURGERY_CENTER): Payer: Medicare Other | Admitting: Internal Medicine

## 2021-07-23 VITALS — BP 132/78 | HR 72 | Temp 97.1°F | Resp 21 | Ht 74.0 in | Wt 217.0 lb

## 2021-07-23 DIAGNOSIS — K208 Other esophagitis without bleeding: Secondary | ICD-10-CM | POA: Diagnosis not present

## 2021-07-23 DIAGNOSIS — K2980 Duodenitis without bleeding: Secondary | ICD-10-CM

## 2021-07-23 DIAGNOSIS — K2970 Gastritis, unspecified, without bleeding: Secondary | ICD-10-CM | POA: Diagnosis not present

## 2021-07-23 DIAGNOSIS — D509 Iron deficiency anemia, unspecified: Secondary | ICD-10-CM | POA: Diagnosis not present

## 2021-07-23 DIAGNOSIS — C182 Malignant neoplasm of ascending colon: Secondary | ICD-10-CM

## 2021-07-23 DIAGNOSIS — D128 Benign neoplasm of rectum: Secondary | ICD-10-CM

## 2021-07-23 DIAGNOSIS — K317 Polyp of stomach and duodenum: Secondary | ICD-10-CM | POA: Diagnosis not present

## 2021-07-23 DIAGNOSIS — K31A0 Gastric intestinal metaplasia, unspecified: Secondary | ICD-10-CM | POA: Diagnosis not present

## 2021-07-23 DIAGNOSIS — K6389 Other specified diseases of intestine: Secondary | ICD-10-CM

## 2021-07-23 DIAGNOSIS — K31819 Angiodysplasia of stomach and duodenum without bleeding: Secondary | ICD-10-CM | POA: Diagnosis not present

## 2021-07-23 DIAGNOSIS — K2950 Unspecified chronic gastritis without bleeding: Secondary | ICD-10-CM | POA: Diagnosis not present

## 2021-07-23 DIAGNOSIS — K621 Rectal polyp: Secondary | ICD-10-CM | POA: Diagnosis not present

## 2021-07-23 DIAGNOSIS — B9681 Helicobacter pylori [H. pylori] as the cause of diseases classified elsewhere: Secondary | ICD-10-CM

## 2021-07-23 DIAGNOSIS — K635 Polyp of colon: Secondary | ICD-10-CM

## 2021-07-23 DIAGNOSIS — D122 Benign neoplasm of ascending colon: Secondary | ICD-10-CM | POA: Diagnosis not present

## 2021-07-23 DIAGNOSIS — I10 Essential (primary) hypertension: Secondary | ICD-10-CM | POA: Diagnosis not present

## 2021-07-23 LAB — CREATININE, SERUM: Creatinine, Ser: 1.16 mg/dL (ref 0.40–1.50)

## 2021-07-23 LAB — BUN: BUN: 16 mg/dL (ref 6–23)

## 2021-07-23 MED ORDER — OMEPRAZOLE 40 MG PO CPDR: 40.0000 mg | DELAYED_RELEASE_CAPSULE | Freq: Two times a day (BID) | ORAL | 1 refills | Status: DC

## 2021-07-23 MED ORDER — SODIUM CHLORIDE 0.9 % IV SOLN: 500.0000 mL | Freq: Once | INTRAVENOUS | Status: DC

## 2021-07-23 NOTE — Progress Notes (Signed)
Called to room to assist during endoscopic procedure.  Patient ID and intended procedure confirmed with present staff. Received instructions for my participation in the procedure from the performing physician.  

## 2021-07-23 NOTE — Progress Notes (Signed)
1545 Patient experiencing nausea and vomiting.  MD updated and Zofran 4 mg IV given, vss  ?

## 2021-07-23 NOTE — Patient Instructions (Addendum)
Handouts provided on Barrett's esophagus, gastritis, polyps and hemorrhoids.  ? ?Use Prilosec (omeprazole) '40mg'$  by mouth twice daily for 8 weeks.  ? ?My office will contact you when the results come in from the pathology. Will refer to surgery and oncology in regards to the mass.  ? ?YOU HAD AN ENDOSCOPIC PROCEDURE TODAY AT [facility]:   Refer to the procedure report that was given to you for any specific questions about what was found during the examination.  If the procedure report does not answer your questions, please call your gastroenterologist to clarify.  If you requested that your care partner not be given the details of your procedure findings, then the procedure report has been included in a sealed envelope for you to review at your convenience later. ? ?YOU SHOULD EXPECT: Some feelings of bloating in the abdomen. Passage of more gas than usual.  Walking can help get rid of the air that was put into your GI tract during the procedure and reduce the bloating. If you had a lower endoscopy (such as a colonoscopy or flexible sigmoidoscopy) you may notice spotting of blood in your stool or on the toilet paper. If you underwent a bowel prep for your procedure, you may not have a normal bowel movement for a few days. ? ?Please Note:  You might notice some irritation and congestion in your nose or some drainage.  This is from the oxygen used during your procedure.  There is no need for concern and it should clear up in a day or so. ? ?SYMPTOMS TO REPORT IMMEDIATELY: ? ?Following lower endoscopy (colonoscopy or flexible sigmoidoscopy): ? Excessive amounts of blood in the stool ? Significant tenderness or worsening of abdominal pains ? Swelling of the abdomen that is new, acute ? Fever of 100?F or higher ? ?Following upper endoscopy (EGD) ? Vomiting of blood or coffee ground material ? New chest pain or pain under the shoulder blades ? Painful or persistently difficult swallowing ? New shortness  of breath ? Fever of 100?F or higher ? Black, tarry-looking stools ? ?For urgent or emergent issues, a gastroenterologist can be reached at any hour by calling [phone]. ?Do not use MyChart messaging for urgent concerns.  ? ? ?DIET:  We do recommend a small meal at first, but then you may proceed to your regular diet.  Drink plenty of fluids but you should avoid alcoholic beverages for 24 hours. ? ?ACTIVITY:  You should plan to take it easy for the rest of today and you should NOT DRIVE or use heavy machinery until tomorrow (because of the sedation medicines used during the test).   ? ?FOLLOW UP: ?Our staff will call the number listed on your records 48-72 hours following your procedure to check on you and address any questions or concerns that you may have regarding the information given to you following your procedure. If we do not reach you, we will leave a message.  We will attempt to reach you two times.  During this call, we will ask if you have developed any symptoms of COVID 19. If you develop any symptoms (ie: fever, flu-like symptoms, shortness of breath, cough etc.) before then, please call [phone].  If you test positive for Covid 19 in the 2 weeks post procedure, please call and report this information to [place].   ? ?If any biopsies were taken you will be contacted by phone or by letter within the next 1-3 weeks.  Please call us  at [phone] if you have not heard about the biopsies in 3 weeks.  ? ? ?SIGNATURES/CONFIDENTIALITY: ?You and/or your care partner have signed paperwork which will be entered into your electronic medical record.  These signatures attest to the fact that that the information above on your After Visit Summary has been reviewed and is understood.  Full responsibility of the confidentiality of this discharge information lies with you and/or your care-partner. ? ?

## 2021-07-23 NOTE — Progress Notes (Signed)
? ?GASTROENTEROLOGY PROCEDURE H&P NOTE  ? ?Primary Care Physician: ?Shirline Frees, MD ? ? ? ?Reason for Procedure:   IDA ? ?Plan:    EGD/colonoscopy ? ?Patient is appropriate for endoscopic procedure(s) in the ambulatory ([facility]) setting. ? ?The nature of the procedure, as well as the risks, benefits, and alternatives were carefully and thoroughly reviewed with the patient. Ample time for discussion and questions allowed. The patient understood, was satisfied, and agreed to proceed.  ? ? ? ?HPI: ?Joseph Pennington is a 88 y.o. male who presents for EGD/colonoscopy for evaluation of IDA .  Patient was most recently seen in the Gastroenterology Clinic on 06/24/21.  No interval change in medical history since that appointment. Please refer to that note for full details regarding GI history and clinical presentation.  ? ?Past Medical History:  ?Diagnosis Date  ? Family history of adverse reaction to anesthesia   ? Hypertension   ? ? ?Past Surgical History:  ?Procedure Laterality Date  ? CHOLECYSTECTOMY N/A 02/20/2018  ? Procedure: LAPAROSCOPIC CHOLECYSTECTOMY;  Surgeon: Excell Seltzer, MD;  Location: [facility];  Service: General;  Laterality: N/A;  ? NO PAST SURGERIES    ? ? ?Prior to Admission medications   ?Medication Sig Start Date End Date Taking? Authorizing Provider  ?levothyroxine (SYNTHROID) 75 MCG tablet Take 75 mcg by mouth daily. 04/18/21  Yes [provider]  ?lisinopril (PRINIVIL,ZESTRIL) 20 MG tablet Take 20 mg by mouth daily. 12/20/17  Yes [provider]  ?cyanocobalamin (,VITAMIN B-12,) 1000 MCG/ML injection Inject 1,000 mcg into the muscle every 30 (thirty) days.  12/22/17   [provider]  ?Iron, Ferrous Sulfate, 325 (65 Fe) MG TABS Take 325 mg by mouth daily. 05/18/21   Sharyn Creamer, MD  ?omeprazole (PRILOSEC) 40 MG capsule Take 1 capsule (40 mg total) by mouth daily. 30 minutes before meals 06/24/21   Sharyn Creamer, MD  ?polyethylene glycol powder (GLYCOLAX/MIRALAX) 17  GM/SCOOP powder Take 17 g by mouth as needed for mild constipation. 05/18/21   Sharyn Creamer, MD  ? ? ?Current Outpatient Medications  ?Medication Sig Dispense Refill  ? levothyroxine (SYNTHROID) 75 MCG tablet Take 75 mcg by mouth daily.    ? lisinopril (PRINIVIL,ZESTRIL) 20 MG tablet Take 20 mg by mouth daily.  3  ? cyanocobalamin (,VITAMIN B-12,) 1000 MCG/ML injection Inject 1,000 mcg into the muscle every 30 (thirty) days.   0  ? Iron, Ferrous Sulfate, 325 (65 Fe) MG TABS Take 325 mg by mouth daily.    ? omeprazole (PRILOSEC) 40 MG capsule Take 1 capsule (40 mg total) by mouth daily. 30 minutes before meals 30 capsule 3  ? polyethylene glycol powder (GLYCOLAX/MIRALAX) 17 GM/SCOOP powder Take 17 g by mouth as needed for mild constipation.    ? ?Current Facility-Administered Medications  ?Medication Dose Route Frequency Provider Last Rate Last Admin  ? 0.9 %  sodium chloride infusion  500 mL Intravenous Once Sharyn Creamer, MD      ? ? ?Allergies as of 07/23/2021  ? (No Known Allergies)  ? ? ?Family History  ?Problem Relation Age of Onset  ? Rectal cancer Mother   ? Heart attack Father   ? ? ?Social History  ? ?Socioeconomic History  ? Marital status: Married  ?  Spouse name: Not on file  ? Number of children: Not on file  ? Years of education: Not on file  ? Highest education level: Not on file  ?Occupational History  ? Not on  file  ?Tobacco Use  ? Smoking status: Former  ? Smokeless tobacco: Never  ?Vaping Use  ? Vaping Use: Never used  ?Substance and Sexual Activity  ? Alcohol use: Not Currently  ? Drug use: Not Currently  ? Sexual activity: Not on file  ?Other Topics Concern  ? Not on file  ?Social History Narrative  ? Not on file  ? ?Social Determinants of Health  ? ?Financial Resource Strain: Not on file  ?Food Insecurity: Not on file  ?Transportation Needs: Not on file  ?Physical Activity: Not on file  ?Stress: Not on file  ?Social Connections: Not on file  ?Intimate Partner Violence: Not on file   ? ? ?Physical Exam: ?Vital signs in last 24 hours: ?BP 132/74   Pulse 91   Temp (!) 97.1 ?F (36.2 ?C)   Ht '6\' 2"'$  (1.88 m)   Wt 217 lb (98.4 kg)   SpO2 100%   BMI 27.86 kg/m?  ?GEN: NAD ?EYE: Sclerae anicteric ?ENT: MMM ?CV: Non-tachycardic ?Pulm: No increased WOB ?GI: Soft ?NEURO:  Alert & Oriented ? ? ?Christia Reading, MD ?[facility] ? ? ?07/23/2021 2:40 PM ? ?

## 2021-07-23 NOTE — Progress Notes (Signed)
Pt given CT contrast x2 bottles and blank instruction sheet. Pt taken for Creatinine and BUN prior to discharge.  ?

## 2021-07-23 NOTE — Progress Notes (Signed)
1454 Robinul 0.1 mg IV given due large amount of secretions upon assessment.  MD made aware, vss 

## 2021-07-23 NOTE — Op Note (Signed)
[facility] ?Patient Name: Joseph Pennington ?Procedure Date: 07/23/2021 2:45 PM ?MRN: [id number] ?Endoscopist: Sonny Masters "Christia Reading ,  ?Age: 88 ?Referring MD:  ?Date of Birth: 11-Dec-1931 ?Gender: Male ?Account #: [id number] ?Procedure:                Upper GI endoscopy ?Indications:              Iron deficiency anemia ?Medicines:                Monitored Anesthesia Care ?Procedure:                Pre-Anesthesia Assessment: ?                          - Prior to the procedure, a History and Physical  ?                          was performed, and patient medications and  ?                          allergies were reviewed. The patient's tolerance of  ?                          previous anesthesia was also reviewed. The risks  ?                          and benefits of the procedure and the sedation  ?                          options and risks were discussed with the patient.  ?                          All questions were answered, and informed consent  ?                          was obtained. Prior Anticoagulants: The patient has  ?                          taken no previous anticoagulant or antiplatelet  ?                          agents. ASA Grade Assessment: II - A patient with  ?                          mild systemic disease. After reviewing the risks  ?                          and benefits, the patient was deemed in  ?                          satisfactory condition to undergo the procedure. ?                          After obtaining informed consent, the endoscope was  ?  passed under direct vision. Throughout the  ?                          procedure, the patient's blood pressure, pulse, and  ?                          oxygen saturations were monitored continuously. The  ?                          [place] [id number] was introduced through  ?                          the mouth, and advanced to the second part of  ?                          duodenum. The upper GI endoscopy  was accomplished  ?                          without difficulty. The patient tolerated the  ?                          procedure well. ?Scope In: ?Scope Out: ?Findings:                 Salmon-colored mucosa was present. Nodularity was  ?                          present. The maximum longitudinal extent of these  ?                          esophageal mucosal changes was 1 cm in length.  ?                          Biopsies were taken with a cold forceps for  ?                          histology. ?                          Multiple 3 to 5 mm sessile polyps with no bleeding  ?                          and no stigmata of recent bleeding were found in  ?                          the gastric body. These polyps were removed with a  ?                          cold snare. Resection and retrieval were complete. ?                          Localized mildly erythematous mucosa without  ?                          bleeding was  found in the gastric antrum. Biopsies  ?                          were taken with a cold forceps for histology. ?                          A single 8 mm mucosal nodule with a localized  ?                          distribution was found in the duodenal bulb.  ?                          Biopsies were taken with a cold forceps for  ?                          histology. ?                          Localized dilated lacteals were found in the second  ?                          portion of the duodenum. Biopsies were taken with a  ?                          cold forceps for histology. ?                          Two angioectasias without bleeding were found in  ?                          the second portion of the duodenum. ?Complications:            No immediate complications. ?Estimated Blood Loss:     Estimated blood loss was minimal. ?Impression:               - Salmon-colored mucosa classified as Barrett's  ?                          stage C1-M1 per Prague criteria. Biopsied. ?                          - Multiple  gastric polyps. Resected and retrieved. ?                          - Erythematous mucosa in the antrum. Biopsied. ?                          - Mucosal nodule found in the duodenum. Biopsied. ?                          - Dilated lacteals were found in the duodenum.  ?                          Biopsied. ?                          -  Two non-bleeding angioectasias in the duodenum. ?Recommendation:           - Await pathology results. ?                          - Use Prilosec (omeprazole) 40 mg PO BID for 8  ?                          weeks. ?                          - Perform a colonoscopy today. ?Georgian Co,  ?07/23/2021 4:32:53 PM ?

## 2021-07-23 NOTE — Progress Notes (Signed)
Report given to PACU, vss 

## 2021-07-23 NOTE — Progress Notes (Signed)
1550 Ephedrine 5 mg given IV due to low BP, MD updated.   ?

## 2021-07-23 NOTE — Progress Notes (Signed)
Ephedrine 5 mg given IV due to low BP, MD updated.   ?

## 2021-07-23 NOTE — Progress Notes (Signed)
1525 Ephedrine 5 mg given IV due to low BP, MD updated.   ?

## 2021-07-23 NOTE — Op Note (Signed)
[facility] [place] ?Patient Name: Joseph Pennington ?Procedure Date: 07/23/2021 2:38 PM ?MRN: [id number] ?Endoscopist: Sonny Masters "Christia Reading ,  ?Age: 88 ?Referring MD:  ?Date of Birth: 1931-07-01 ?Gender: Male ?Account #: [id number] ?Procedure:                Colonoscopy ?Indications:              Iron deficiency anemia ?Medicines:                Monitored Anesthesia Care ?Procedure:                Pre-Anesthesia Assessment: ?                          - Prior to the procedure, a History and Physical  ?                          was performed, and patient medications and  ?                          allergies were reviewed. The patient's tolerance of  ?                          previous anesthesia was also reviewed. The risks  ?                          and benefits of the procedure and the sedation  ?                          options and risks were discussed with the patient.  ?                          All questions were answered, and informed consent  ?                          was obtained. Prior Anticoagulants: The patient has  ?                          taken no previous anticoagulant or antiplatelet  ?                          agents. ASA Grade Assessment: II - A patient with  ?                          mild systemic disease. After reviewing the risks  ?                          and benefits, the patient was deemed in  ?                          satisfactory condition to undergo the procedure. ?                          After obtaining informed consent, the colonoscope  ?  was passed under direct vision. Throughout the  ?                          procedure, the patient's blood pressure, pulse, and  ?                          oxygen saturations were monitored continuously. The  ?                          CF [id number] #[id number] was introduced through the anus  ?                          and advanced to the the ascending colon to examine  ?                          a mass. This was the intended  extent. The patient  ?                          tolerated the procedure well. The quality of the  ?                          bowel preparation was good. The rectum was  ?                          photographed. The colonoscopy was somewhat  ?                          difficult due to a partially obstructing mass. The  ?                          adult colonoscope was switched to the ultraslim  ?                          colonoscope in order to try to bypass the mass. ?Scope In: 3:27:00 PM ?Scope Out: 4:23:16 PM ?Total Procedure Duration: 0 hours 56 minutes 16 seconds  ?Findings:                 A frond-like/villous, fungating and ulcerated  ?                          partially obstructing large mass was found in the  ?                          ascending colon. There was a point at which the  ?                          mass could not be bypassed, even with use of an  ?                          ultraslim colonoscope. It is suspected that this  ?                          mass encompasses the  entire cecum and ascending  ?                          colon. The mass was circumferential. The mass  ?                          measured fifteen cm in length. Oozing was present.  ?                          Biopsies were taken with a cold forceps for  ?                          histology. Area was tattooed with two injections of  ?                          Spot (carbon black) distal to the mass. ?                          Six sessile polyps were found in the rectum and  ?                          transverse colon. The polyps were 3 to 8 mm in  ?                          size. These polyps were removed with a cold snare.  ?                          Resection and retrieval were complete. ?                          Non-bleeding internal hemorrhoids were found during  ?                          retroflexion. ?Complications:            No immediate complications. ?Estimated Blood Loss:     Estimated blood loss was minimal. ?Impression:                - Likely malignant partially obstructing tumor in  ?                          the ascending colon. Biopsied. ?                          - Six 3 to 8 mm polyps in the rectum and in the  ?                          transverse colon, removed with a cold snare.  ?                          Resected and retrieved. ?                          - Non-bleeding internal hemorrhoids. ?Recommendation:           -  Discharge patient to home (with escort). ?                          - Await pathology results. ?                          - Will plan for CT chest/abdomen/pelvis for further  ?                          evaluation. ?                          - Will refer to surgery for consideration of  ?                          surgical resection. ?                          - WIll refer to oncology for evaluation of medical  ?                          therapies. ?                          - The findings and recommendations were discussed  ?                          with the patient. ?Georgian Co,  ?07/23/2021 4:43:33 PM ?

## 2021-07-24 ENCOUNTER — Other Ambulatory Visit: Payer: Self-pay

## 2021-07-24 ENCOUNTER — Ambulatory Visit (HOSPITAL_COMMUNITY): Payer: Medicare Other

## 2021-07-24 ENCOUNTER — Telehealth: Payer: Self-pay

## 2021-07-24 DIAGNOSIS — K6389 Other specified diseases of intestine: Secondary | ICD-10-CM

## 2021-07-24 DIAGNOSIS — R1900 Intra-abdominal and pelvic swelling, mass and lump, unspecified site: Secondary | ICD-10-CM

## 2021-07-24 NOTE — Telephone Encounter (Signed)
CT orders have been placed & schedulers notified.  ?

## 2021-07-24 NOTE — Telephone Encounter (Signed)
-----   Message from Sharyn Creamer, MD sent at 07/23/2021  5:33 PM EDT ----- ?Hi Ammie, please place order for urgent CT chest/abdomen/pelvis due to discovery of a colon mass. Thanks. ? ?

## 2021-07-24 NOTE — Telephone Encounter (Signed)
A user error has taken place: encounter opened in error, closed for administrative reasons.

## 2021-07-27 ENCOUNTER — Telehealth: Payer: Self-pay

## 2021-07-27 ENCOUNTER — Telehealth: Payer: Self-pay | Admitting: *Deleted

## 2021-07-27 NOTE — Telephone Encounter (Signed)
?  Follow up Call- ? ? ?  07/23/2021  ?  2:22 PM  ?Call back number  ?Post procedure Call Back phone  # [phone]  ?Permission to leave phone message Yes  ?  ? ?Post op call attempted, no answer, left WM.  ? ?

## 2021-07-27 NOTE — Telephone Encounter (Signed)
No answer at 2nd attempt follow up phone call.  Left message on voicemail.   ?

## 2021-07-28 ENCOUNTER — Other Ambulatory Visit: Payer: Self-pay

## 2021-07-28 ENCOUNTER — Telehealth: Payer: Self-pay | Admitting: Internal Medicine

## 2021-07-28 DIAGNOSIS — R1900 Intra-abdominal and pelvic swelling, mass and lump, unspecified site: Secondary | ICD-10-CM

## 2021-07-28 DIAGNOSIS — K6389 Other specified diseases of intestine: Secondary | ICD-10-CM

## 2021-07-28 DIAGNOSIS — C182 Malignant neoplasm of ascending colon: Secondary | ICD-10-CM

## 2021-07-28 NOTE — Telephone Encounter (Signed)
Spoke to Joseph Pennington about the results of his colon mass biopsies, which confirmed poorly differentiated carcinoma. He is scheduled for CT C/A/P this Thursday. Will get him set up with medical oncology and surgery. ?

## 2021-07-29 ENCOUNTER — Telehealth: Payer: Self-pay | Admitting: Nurse Practitioner

## 2021-07-29 NOTE — Telephone Encounter (Signed)
Scheduled appt per 5/9 referral. Pt is aware of appt date and time. Pt is aware to arrive 15 mins prior to appt time and to bring and updated insurance card. Pt is aware of appt location.   ?

## 2021-07-30 ENCOUNTER — Ambulatory Visit (HOSPITAL_COMMUNITY)
Admission: RE | Admit: 2021-07-30 | Discharge: 2021-07-30 | Disposition: A | Discharge status: Home / Self Care | Payer: Medicare Other | Source: Ambulatory Visit | Attending: Internal Medicine | Admitting: Internal Medicine

## 2021-07-30 DIAGNOSIS — K6389 Other specified diseases of intestine: Secondary | ICD-10-CM | POA: Diagnosis not present

## 2021-07-30 DIAGNOSIS — R1900 Intra-abdominal and pelvic swelling, mass and lump, unspecified site: Secondary | ICD-10-CM | POA: Insufficient documentation

## 2021-07-30 DIAGNOSIS — R918 Other nonspecific abnormal finding of lung field: Secondary | ICD-10-CM | POA: Diagnosis not present

## 2021-07-30 DIAGNOSIS — J9811 Atelectasis: Secondary | ICD-10-CM | POA: Diagnosis not present

## 2021-07-30 DIAGNOSIS — K429 Umbilical hernia without obstruction or gangrene: Secondary | ICD-10-CM | POA: Diagnosis not present

## 2021-07-30 MED ORDER — IOHEXOL 300 MG/ML  SOLN
100.0000 mL | Freq: Once | INTRAMUSCULAR | Status: AC | PRN
  Administered 2021-07-30: 100 mL via INTRAVENOUS

## 2021-07-30 MED ORDER — SODIUM CHLORIDE (PF) 0.9 % IJ SOLN
INTRAMUSCULAR | Status: AC
  Filled 2021-07-30: qty 50

## 2021-08-03 ENCOUNTER — Telehealth: Payer: Self-pay | Admitting: Internal Medicine

## 2021-08-03 DIAGNOSIS — E039 Hypothyroidism, unspecified: Secondary | ICD-10-CM | POA: Diagnosis not present

## 2021-08-03 DIAGNOSIS — D500 Iron deficiency anemia secondary to blood loss (chronic): Secondary | ICD-10-CM | POA: Diagnosis not present

## 2021-08-03 DIAGNOSIS — E119 Type 2 diabetes mellitus without complications: Secondary | ICD-10-CM | POA: Diagnosis not present

## 2021-08-03 DIAGNOSIS — D490 Neoplasm of unspecified behavior of digestive system: Secondary | ICD-10-CM | POA: Diagnosis not present

## 2021-08-03 DIAGNOSIS — N183 Chronic kidney disease, stage 3 unspecified: Secondary | ICD-10-CM | POA: Diagnosis not present

## 2021-08-03 DIAGNOSIS — E538 Deficiency of other specified B group vitamins: Secondary | ICD-10-CM | POA: Diagnosis not present

## 2021-08-03 DIAGNOSIS — I10 Essential (primary) hypertension: Secondary | ICD-10-CM | POA: Diagnosis not present

## 2021-08-03 DIAGNOSIS — C182 Malignant neoplasm of ascending colon: Secondary | ICD-10-CM | POA: Diagnosis not present

## 2021-08-03 NOTE — Telephone Encounter (Signed)
Spoke to Joseph Pennington regarding the results of his CT chest/abdomen/pelvis.  His CT did show the right sided colonic mass and some prominent lymph nodes. His CT shows no obvious distant metastasis.  He had his appointment with Dr. Dema Severin today, and they plan to proceed with surgical resection about a month. Patient has appt with oncology on 5/17. ?

## 2021-08-05 ENCOUNTER — Encounter: Payer: Self-pay | Admitting: Nurse Practitioner

## 2021-08-05 ENCOUNTER — Inpatient Hospital Stay: Payer: Medicare Other

## 2021-08-05 ENCOUNTER — Inpatient Hospital Stay: Payer: Medicare Other | Attending: Nurse Practitioner | Admitting: Nurse Practitioner

## 2021-08-05 ENCOUNTER — Other Ambulatory Visit: Payer: Self-pay

## 2021-08-05 VITALS — BP 129/68 | HR 93 | Temp 97.6°F | Resp 17 | Wt 210.0 lb

## 2021-08-05 DIAGNOSIS — R7303 Prediabetes: Secondary | ICD-10-CM | POA: Diagnosis not present

## 2021-08-05 DIAGNOSIS — I129 Hypertensive chronic kidney disease with stage 1 through stage 4 chronic kidney disease, or unspecified chronic kidney disease: Secondary | ICD-10-CM | POA: Insufficient documentation

## 2021-08-05 DIAGNOSIS — R197 Diarrhea, unspecified: Secondary | ICD-10-CM | POA: Diagnosis not present

## 2021-08-05 DIAGNOSIS — D500 Iron deficiency anemia secondary to blood loss (chronic): Secondary | ICD-10-CM | POA: Diagnosis not present

## 2021-08-05 DIAGNOSIS — K219 Gastro-esophageal reflux disease without esophagitis: Secondary | ICD-10-CM | POA: Insufficient documentation

## 2021-08-05 DIAGNOSIS — N189 Chronic kidney disease, unspecified: Secondary | ICD-10-CM | POA: Insufficient documentation

## 2021-08-05 DIAGNOSIS — G893 Neoplasm related pain (acute) (chronic): Secondary | ICD-10-CM | POA: Insufficient documentation

## 2021-08-05 DIAGNOSIS — R634 Abnormal weight loss: Secondary | ICD-10-CM | POA: Diagnosis not present

## 2021-08-05 DIAGNOSIS — C182 Malignant neoplasm of ascending colon: Secondary | ICD-10-CM | POA: Diagnosis not present

## 2021-08-05 DIAGNOSIS — Z79899 Other long term (current) drug therapy: Secondary | ICD-10-CM | POA: Diagnosis not present

## 2021-08-05 DIAGNOSIS — E039 Hypothyroidism, unspecified: Secondary | ICD-10-CM | POA: Insufficient documentation

## 2021-08-05 DIAGNOSIS — Z87891 Personal history of nicotine dependence: Secondary | ICD-10-CM | POA: Insufficient documentation

## 2021-08-05 LAB — IRON AND IRON BINDING CAPACITY (CC-WL,HP ONLY)
Iron: 19 ug/dL — ABNORMAL LOW (ref 45–182)
Saturation Ratios: 7 % — ABNORMAL LOW (ref 17.9–39.5)
TIBC: 288 ug/dL (ref 250–450)
UIBC: 269 ug/dL (ref 117–376)

## 2021-08-05 LAB — CMP (CANCER CENTER ONLY)
ALT: 8 U/L (ref 0–44)
AST: 11 U/L — ABNORMAL LOW (ref 15–41)
Albumin: 3.5 g/dL (ref 3.5–5.0)
Alkaline Phosphatase: 59 U/L (ref 38–126)
Anion gap: 6 (ref 5–15)
BUN: 17 mg/dL (ref 8–23)
CO2: 27 mmol/L (ref 22–32)
Calcium: 9 mg/dL (ref 8.9–10.3)
Chloride: 101 mmol/L (ref 98–111)
Creatinine: 1.23 mg/dL (ref 0.61–1.24)
GFR, Estimated: 57 mL/min — ABNORMAL LOW (ref >60)
Glucose, Bld: 140 mg/dL — ABNORMAL HIGH (ref 70–99)
Potassium: 3.9 mmol/L (ref 3.5–5.1)
Sodium: 134 mmol/L — ABNORMAL LOW (ref 135–145)
Total Bilirubin: 0.4 mg/dL (ref 0.3–1.2)
Total Protein: 6.4 g/dL — ABNORMAL LOW (ref 6.5–8.1)

## 2021-08-05 LAB — CBC WITH DIFFERENTIAL (CANCER CENTER ONLY)
Abs Immature Granulocytes: 0.03 10*3/uL (ref 0.00–0.07)
Basophils Absolute: 0 10*3/uL (ref 0.0–0.1)
Basophils Relative: 0 %
Eosinophils Absolute: 0.1 10*3/uL (ref 0.0–0.5)
Eosinophils Relative: 1 %
HCT: 28.5 % — ABNORMAL LOW (ref 39.0–52.0)
Hemoglobin: 9.1 g/dL — ABNORMAL LOW (ref 13.0–17.0)
Immature Granulocytes: 0 %
Lymphocytes Relative: 13 %
Lymphs Abs: 0.9 10*3/uL (ref 0.7–4.0)
MCH: 26.9 pg (ref 26.0–34.0)
MCHC: 31.9 g/dL (ref 30.0–36.0)
MCV: 84.3 fL (ref 80.0–100.0)
Monocytes Absolute: 0.6 10*3/uL (ref 0.1–1.0)
Monocytes Relative: 8 %
Neutro Abs: 5.7 10*3/uL (ref 1.7–7.7)
Neutrophils Relative %: 78 %
Platelet Count: 277 10*3/uL (ref 150–400)
RBC: 3.38 MIL/uL — ABNORMAL LOW (ref 4.22–5.81)
RDW: 14.5 % (ref 11.5–15.5)
WBC Count: 7.3 10*3/uL (ref 4.0–10.5)
nRBC: 0 % (ref 0.0–0.2)

## 2021-08-05 LAB — CEA (IN HOUSE-CHCC): CEA (CHCC-In House): 1.35 ng/mL (ref 0.00–5.00)

## 2021-08-05 LAB — FERRITIN: Ferritin: 39 ng/mL (ref 24–336)

## 2021-08-05 NOTE — Progress Notes (Signed)
I met with Joseph Pennington and his family during his consultation with Cira Rue, NP and Dr Burr Medico.  I explained my role as a nurse navigator and provided my contact information. ?I explained the services provided at [facility] and provided written information.  I explained the alight grant and let  him know one of the financial advisors will reach out to  him if he should need chemotherapy or radiation therapy.  All questions were answered. They verbalized understanding. ? ?

## 2021-08-05 NOTE — Progress Notes (Addendum)
?[place]   ?[phone]) [phone] [phone]   ?Clinic New Consult Note  ? ?Patient Care Team: ?Shirline Frees, MD as PCP - General (Family Medicine) ?08/05/2021 ? ?CHIEF COMPLAINTS/PURPOSE OF CONSULTATION:  ?Colon cancer, referred by GI Dr. Lorenso Courier ? ?HISTORY OF PRESENTING ILLNESS:  ?Joseph Pennington 88 y.o. male with past medical history including hypertension, hypothyroidism, pre-DM (HgbA1c 5.8 off medication), CKD (creatinine 2.2 in 2019 but normal now), GERD, and iron deficiency anemia is here because of newly diagnosed colon cancer. He presented to GI Dr. Lorenso Courier 06/24/21 with melena and rectal bleeding. He was on oral iron for IDA since 05/2021 and had dark stools. He underwent EGD 07/23/21 which showed gastric polyps and 2 non-bleeding angiectasias. Colonoscopy revealed a frond-like/villous, fungating and ulcerated partially obstructing large mass in the ascending colon. The mass was circumferential, could not be bypassed, and is suspected that it encompasses the entire cecum and ascending colon.The mass measured fifteen cm in length. Oozing was present. Notably six sessile polyps were found in the rectum and transverse colon. EGD path showed peptic duodenitis and chronic gastritis. 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 Staging CT CAP 07/30/21 showed mass-like region in the ascending colon extending to the haptic flexure with associated pericolonic fat stranding and a few prominent adjacent lymph nodes suspicious for local nodal involvement. No distant metastasis is seen. He was seen by colorectal surgeon Dr. Nadeen Landau 08/03/21 who recommended right hemicolectomy after medical clearance is obtained.  ? ?Socially he is married, lives with his spouse for whom he is the primary caregiver, she has Parkinson's disease.  He has 2 healthy adult children, daughter is a Software engineer and son is a Cabin crew PhD.  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  He lives locally and also has a farm house in [place].  He is independent with ADLs and drives.  He denies alcohol or drug use.  He is a former cigarette smoker smoked less than or equal to 1 pack/day x 20 years, quit 45 years ago.  He was compliant with colonoscopies previous to his diagnosis.  His mother had a rectal sarcoma at age 26, no other family history of cancer including breast, ovarian, prostate, pancreatic, or colon cancers.  ? ?Today he presents with his children.  Further 6 months he has low appetite, 20 pounds unintentional weight loss, right abdominal discomfort that is constant now, and bowel changes.  Abdominal pain is mild unless he overeats.  He attributed these changes to medication adjustments.  He has diarrhea 1-2 times per day, stools are dark due to oral iron but not bloody.  He has been on oral iron since March, tolerating 1 tablet but not too.  He remains out of bed and active in his two-story home but rests often.  Continues to work outside. ? ?MEDICAL HISTORY:  ?Past Medical History:  ?Diagnosis Date  ? Family history of adverse reaction to anesthesia   ? Hypertension   ? ? ?SURGICAL HISTORY: ?Past Surgical History:  ?Procedure Laterality Date  ? CHOLECYSTECTOMY N/A 02/20/2018  ? Procedure: LAPAROSCOPIC CHOLECYSTECTOMY;  Surgeon: Excell Seltzer, MD;  Location: [facility];  Service: General;  Laterality: N/A;  ? NO PAST SURGERIES    ? ? ?SOCIAL HISTORY: ?Social History  ? ?Socioeconomic History  ? Marital status: Married  ?  Spouse name: Not on file  ? Number of children: Not on file  ? Years of education: Not on file  ?  Highest education level: Not on file  ?Occupational History  ? Not on file  ?Tobacco Use  ? Smoking status: Former  ? Smokeless tobacco: Never  ?Vaping Use  ? Vaping Use: Never used  ?Substance and Sexual Activity  ? Alcohol use: Not Currently  ? Drug use: Not Currently  ? Sexual activity: Not on file  ?Other Topics Concern  ? Not on file  ?Social History  Narrative  ? Not on file  ? ?Social Determinants of Health  ? ?Financial Resource Strain: Not on file  ?Food Insecurity: Not on file  ?Transportation Needs: Not on file  ?Physical Activity: Not on file  ?Stress: Not on file  ?Social Connections: Not on file  ?Intimate Partner Violence: Not on file  ? ? ?FAMILY HISTORY: ?Family History  ?Problem Relation Age of Onset  ? Rectal cancer Mother   ? Heart attack Father   ? ? ?ALLERGIES:  has No Known Allergies. ? ?MEDICATIONS:  ?Current Outpatient Medications  ?Medication Sig Dispense Refill  ? cyanocobalamin (,VITAMIN B-12,) 1000 MCG/ML injection Inject 1,000 mcg into the muscle every 30 (thirty) days.   0  ? Iron, Ferrous Sulfate, 325 (65 Fe) MG TABS Take 325 mg by mouth daily.    ? levothyroxine (SYNTHROID) 75 MCG tablet Take 75 mcg by mouth daily.    ? lisinopril (PRINIVIL,ZESTRIL) 20 MG tablet Take 20 mg by mouth daily.  3  ? omeprazole (PRILOSEC) 40 MG capsule Take 1 capsule (40 mg total) by mouth daily. 30 minutes before meals 30 capsule 3  ? polyethylene glycol powder (GLYCOLAX/MIRALAX) 17 GM/SCOOP powder Take 17 g by mouth as needed for mild constipation.    ? ?No current facility-administered medications for this visit.  ? ? ?REVIEW OF SYSTEMS:   ?Constitutional: Denies fevers, chills or abnormal night sweats (+) fatigue (+) 20 pounds unintentional weight loss in 6 months (+) low appetite ?Eyes: Denies blurriness of vision, double vision or watery eyes ?Ears, nose, mouth, throat, and face: Denies mucositis or sore throat ?Respiratory: Denies cough, dyspnea or wheezes ?Cardiovascular: Denies palpitation, chest discomfort or lower extremity swelling ?Gastrointestinal:  Denies nausea, vomiting, constipation, heartburn (+)  change in bowel habits (+) right abdominal pain (+) diarrhea  ?Skin: Denies abnormal skin rashes ?Lymphatics: Denies new lymphadenopathy or easy bruising ?Neurological:Denies numbness, tingling or new weaknesses ?Behavioral/Psych: Mood is stable,  no new changes  ?All other systems were reviewed with the patient and are negative. ? ?PHYSICAL EXAMINATION: ?ECOG PERFORMANCE STATUS: 1 - Symptomatic but completely ambulatory ? ?Vitals:  ? 08/05/21 1108  ?BP: 129/68  ?Pulse: 93  ?Resp: 17  ?Temp: 97.6 ?F (36.4 ?C)  ?SpO2: 98%  ? ?Filed Weights  ? 08/05/21 1108  ?Weight: 210 lb (95.3 kg)  ? ? ?GENERAL:alert, no distress and comfortable ?SKIN: No rash ?EYES: sclera clear ?NECK: Without mass ?LYMPH:  no palpable cervical or supraclavicular lymphadenopathy  ?LUNGS: clear with normal breathing effort ?HEART: regular rate & rhythm, no lower extremity edema ?ABDOMEN:abdomen soft and normal bowel sounds.  Mild RUQ fullness and TTP ?Musculoskeletal:no cyanosis of digits and no clubbing  ?PSYCH: alert & oriented x 3 with fluent speech ?NEURO: no focal motor/sensory deficits ? ?LABORATORY DATA:  ?I have reviewed the data as listed ? ?  Latest Ref Rng & Units 08/05/2021  ? 12:06 PM 06/24/2021  ?  3:45 PM 05/13/2021  ?  9:56 AM  ?CBC  ?WBC 4.0 - 10.5 K/uL 7.3   6.2   7.8    ?Hemoglobin  13.0 - 17.0 g/dL 9.1   11.0   11.0    ?Hematocrit 39.0 - 52.0 % 28.5   33.7   33.6    ?Platelets 150 - 400 K/uL 277   286.0   259.0    ? ? ? ?  Latest Ref Rng & Units 08/05/2021  ? 12:06 PM 07/23/2021  ?  5:09 PM 02/22/2018  ?  5:51 AM  ?CMP  ?Glucose 70 - 99 mg/dL 140    109    ?BUN 8 - 23 mg/dL '17   16   22    '$ ?Creatinine 0.61 - 1.24 mg/dL 1.23   1.16   1.17    ?Sodium 135 - 145 mmol/L 134    137    ?Potassium 3.5 - 5.1 mmol/L 3.9    4.1    ?Chloride 98 - 111 mmol/L 101    104    ?CO2 22 - 32 mmol/L 27    26    ?Calcium 8.9 - 10.3 mg/dL 9.0    8.8    ?Total Protein 6.5 - 8.1 g/dL 6.4      ?Total Bilirubin 0.3 - 1.2 mg/dL 0.4      ?Alkaline Phos 38 - 126 U/L 59      ?AST 15 - 41 U/L 11      ?ALT 0 - 44 U/L 8      ?  ? ?RADIOGRAPHIC STUDIES: ?I have personally reviewed the radiological images as listed and agreed with the findings in the report. ?CT CHEST W CONTRAST ? ?Result Date: 08/01/2021 ?CLINICAL  DATA:  Colonic mass, abdominal mass. Right-sided discomfort. EXAM: CT CHEST, ABDOMEN, AND PELVIS WITH CONTRAST TECHNIQUE: Multidetector CT imaging of the chest, abdomen and pelvis was performed followin

## 2021-08-06 DIAGNOSIS — C182 Malignant neoplasm of ascending colon: Secondary | ICD-10-CM | POA: Insufficient documentation

## 2021-08-12 ENCOUNTER — Other Ambulatory Visit: Payer: Self-pay

## 2021-08-12 NOTE — Progress Notes (Signed)
The proposed treatment discussed in conference is for discussion purpose only and is not a binding recommendation.  The patients have not been physically examined, or presented with their treatment options.  Therefore, final treatment plans cannot be decided.  

## 2021-08-20 DIAGNOSIS — Z9289 Personal history of other medical treatment: Secondary | ICD-10-CM

## 2021-08-20 HISTORY — DX: Personal history of other medical treatment: Z92.89

## 2021-08-27 ENCOUNTER — Ambulatory Visit: Payer: Self-pay | Admitting: Surgery

## 2021-08-27 DIAGNOSIS — Z01818 Encounter for other preprocedural examination: Secondary | ICD-10-CM

## 2021-08-27 NOTE — Progress Notes (Signed)
Sent message, via epic in basket, requesting order in epic from surgeon     08/27/21 0901  Preop Orders  Has preop orders? No  Name of staff/physician contacted for orders(Indicate phone or IB message) C. Dema Severin, MD.

## 2021-09-02 NOTE — Patient Instructions (Addendum)
DUE TO COVID-19 ONLY TWO VISITORS  (aged 86 and older)  ARE ALLOWED TO COME WITH YOU AND STAY IN THE WAITING ROOM ONLY DURING PRE OP AND PROCEDURE.   **NO VISITORS ARE ALLOWED IN THE SHORT STAY AREA OR RECOVERY ROOM!!**  IF YOU WILL BE ADMITTED INTO THE HOSPITAL YOU ARE ALLOWED ONLY FOUR SUPPORT PEOPLE DURING VISITATION HOURS ONLY (7 AM -8PM)   The support person(s) must pass our screening, gel in and out, and wear a mask at all times, including in the patient's room. Patients must also wear a mask when staff or their support person are in the room. Visitors GUEST BADGE MUST BE WORN VISIBLY  One adult visitor may remain with you overnight and MUST be in the room by 8 P.M.     Your procedure is scheduled on: 09/14/21   Report to [facility] Main Entrance    Report to admitting at  5:15 AM   Call this number if you have problems the morning of surgery [phone]   Do not eat food :After Midnight.   After Midnight you may have the following liquids until __4:30 ____ AM/  DAY OF SURGERY  Water Black Coffee (sugar ok, NO MILK/CREAM OR CREAMERS)  Tea (sugar ok, NO MILK/CREAM OR CREAMERS) regular and decaf                             Plain Jell-O (NO RED)                                           Fruit ices (not with fruit pulp, NO RED)                                     Popsicles (NO RED)                                                                  Juice: apple, WHITE grape, WHITE cranberry Sports drinks like Gatorade (NO RED) Clear broth(vegetable,chicken,beef)               Drink 2 Ensure/G2 drinks AT 10:00 PM the night before surgery.         The day of surgery:  Drink ONE (1) Pre-Surgery Clear Ensure  at  4:15 AM the morning of surgery. Drink in one sitting. Do not sip.  This drink was given to you during your hospital  pre-op appointment visit. Nothing else to drink after completing the  Pre-Surgery Clear Ensure 4:30 AM          If you have questions, please  contact your surgeon's office.   FOLLOW BOWEL PREP AND ANY ADDITIONAL PRE OP INSTRUCTIONS YOU RECEIVED FROM YOUR SURGEON'S OFFICE!!!  Drink plenty of fluids on the day of prep   Oral Hygiene is also important to reduce your risk of infection.  Remember - BRUSH YOUR TEETH THE MORNING OF SURGERY WITH YOUR REGULAR TOOTHPASTE   Do NOT smoke after Midnight   Take these medicines the morning of surgery with A SIP OF WATER: Synthroid, Omeprazole                               You may not have any metal on your body including jewelry, and body piercing             Do not wear lotions, powders, /cologne, or deodorant                Men may shave face and neck.   Do not bring valuables to the hospital. [place].   Contacts, dentures or bridgework may not be worn into surgery.   Bring small overnight bag day of surgery.   DO NOT [place]. PHARMACY WILL DISPENSE MEDICATIONS LISTED ON YOUR MEDICATION LIST TO YOU DURING YOUR ADMISSION [place]!       Special Instructions: Bring a copy of your healthcare power of attorney and living will documents  the day of surgery if you haven't scanned them before.              Please read over the following fact sheets you were given: IF YOU HAVE QUESTIONS ABOUT YOUR PRE-OP INSTRUCTIONS PLEASE CALL [phone]     [facility] - Preparing for Surgery Before surgery, you can play an important role.  Because skin is not sterile, your skin needs to be as free of germs as possible.  You can reduce the number of germs on your skin by washing with CHG (chlorahexidine gluconate) soap before surgery.  CHG is an antiseptic cleaner which kills germs and bonds with the skin to continue killing germs even after washing. Please DO NOT use if you have an allergy to CHG or antibacterial soaps.  If your skin becomes reddened/irritated stop using  the CHG and inform your nurse when you arrive at Short Stay.  You may shave your face/neck. Please follow these instructions carefully:  1.  Shower with CHG Soap the night before surgery and the  morning of Surgery.  2.  If you choose to wash your hair, wash your hair first as usual with your  normal  shampoo.  3.  After you shampoo, rinse your hair and body thoroughly to remove the  shampoo.                            4.  Use CHG as you would any other liquid soap.  You can apply chg directly  to the skin and wash                       Gently with a scrungie or clean washcloth.  5.  Apply the CHG Soap to your body ONLY FROM THE NECK DOWN.   Do not use on face/ open                           Wound or open sores. Avoid contact with eyes, ears mouth and genitals (private parts).  Wash face,  Genitals (private parts) with your normal soap.             6.  Wash thoroughly, paying special attention to the area where your surgery  will be performed.  7.  Thoroughly rinse your body with warm water from the neck down.  8.  DO NOT shower/wash with your normal soap after using and rinsing off  the CHG Soap.                9.  Pat yourself dry with a clean towel.            10.  Wear clean pajamas.            11.  Place clean sheets on your bed the night of your first shower and do not  sleep with pets. Day of Surgery : Do not apply any lotions/deodorants the morning of surgery.  Please wear clean clothes to the hospital/surgery center.  FAILURE TO FOLLOW THESE INSTRUCTIONS MAY RESULT IN THE CANCELLATION OF YOUR SURGERY    ________________________________________________________________________   Incentive Spirometer  An incentive spirometer is a tool that can help keep your lungs clear and active. This tool measures how well you are filling your lungs with each breath. Taking long deep breaths may help reverse or decrease the chance of developing breathing (pulmonary) problems  (especially infection) following: A long period of time when you are unable to move or be active. BEFORE THE PROCEDURE  If the spirometer includes an indicator to show your best effort, your nurse or respiratory therapist will set it to a desired goal. If possible, sit up straight or lean slightly forward. Try not to slouch. Hold the incentive spirometer in an upright position. INSTRUCTIONS FOR USE  Sit on the edge of your bed if possible, or sit up as far as you can in bed or on a chair. Hold the incentive spirometer in an upright position. Breathe out normally. Place the mouthpiece in your mouth and seal your lips tightly around it. Breathe in slowly and as deeply as possible, raising the piston or the ball toward the top of the column. Hold your breath for 3-5 seconds or for as long as possible. Allow the piston or ball to fall to the bottom of the column. Remove the mouthpiece from your mouth and breathe out normally. Rest for a few seconds and repeat Steps 1 through 7 at least 10 times every 1-2 hours when you are awake. Take your time and take a few normal breaths between deep breaths. The spirometer may include an indicator to show your best effort. Use the indicator as a goal to work toward during each repetition. After each set of 10 deep breaths, practice coughing to be sure your lungs are clear. If you have an incision (the cut made at the time of surgery), support your incision when coughing by placing a pillow or rolled up towels firmly against it. Once you are able to get out of bed, walk around indoors and cough well. You may stop using the incentive spirometer when instructed by your caregiver.  RISKS AND COMPLICATIONS Take your time so you do not get dizzy or light-headed. If you are in pain, you may need to take or ask for pain medication before doing incentive spirometry. It is harder to take a deep breath if you are having pain. AFTER USE Rest and breathe slowly and  easily. It can be helpful to keep track of a log  of your progress. Your caregiver can provide you with a simple table to help with this. If you are using the spirometer at home, follow these instructions: [place] IF:  You are having difficultly using the spirometer. You have trouble using the spirometer as often as instructed. Your pain medication is not giving enough relief while using the spirometer. You develop fever of 100.5 F (38.1 C) or higher. SEEK IMMEDIATE MEDICAL CARE IF:  You cough up bloody sputum that had not been present before. You develop fever of 102 F (38.9 C) or greater. You develop worsening pain at or near the incision site. MAKE SURE YOU:  Understand these instructions. Will watch your condition. Will get help right away if you are not doing well or get worse. Document Released: 07/19/2006 Document Revised: 05/31/2011 Document Reviewed: 09/19/2006 [facility] Patient Information 2014 [place], [place].   ________________________________________________________________________

## 2021-09-03 ENCOUNTER — Encounter (HOSPITAL_COMMUNITY): Payer: Self-pay

## 2021-09-03 ENCOUNTER — Encounter (HOSPITAL_COMMUNITY)
Admission: RE | Admit: 2021-09-03 | Discharge: 2021-09-03 | Disposition: A | Discharge status: Home / Self Care | Payer: Medicare Other | Source: Ambulatory Visit | Attending: Surgery | Admitting: Surgery

## 2021-09-03 ENCOUNTER — Other Ambulatory Visit: Payer: Self-pay

## 2021-09-03 DIAGNOSIS — Z01818 Encounter for other preprocedural examination: Secondary | ICD-10-CM | POA: Insufficient documentation

## 2021-09-03 LAB — CBC WITH DIFFERENTIAL/PLATELET
Abs Immature Granulocytes: 0.03 10*3/uL (ref 0.00–0.07)
Basophils Absolute: 0 10*3/uL (ref 0.0–0.1)
Basophils Relative: 0 %
Eosinophils Absolute: 0.1 10*3/uL (ref 0.0–0.5)
Eosinophils Relative: 1 %
HCT: 24.8 % — ABNORMAL LOW (ref 39.0–52.0)
Hemoglobin: 7.7 g/dL — ABNORMAL LOW (ref 13.0–17.0)
Immature Granulocytes: 0 %
Lymphocytes Relative: 14 %
Lymphs Abs: 1 10*3/uL (ref 0.7–4.0)
MCH: 25.7 pg — ABNORMAL LOW (ref 26.0–34.0)
MCHC: 31 g/dL (ref 30.0–36.0)
MCV: 82.7 fL (ref 80.0–100.0)
Monocytes Absolute: 0.7 10*3/uL (ref 0.1–1.0)
Monocytes Relative: 9 %
Neutro Abs: 5.4 10*3/uL (ref 1.7–7.7)
Neutrophils Relative %: 76 %
Platelets: 353 10*3/uL (ref 150–400)
RBC: 3 MIL/uL — ABNORMAL LOW (ref 4.22–5.81)
RDW: 14.5 % (ref 11.5–15.5)
WBC: 7.2 10*3/uL (ref 4.0–10.5)
nRBC: 0 % (ref 0.0–0.2)

## 2021-09-03 LAB — COMPREHENSIVE METABOLIC PANEL WITH GFR
ALT: 12 U/L (ref 0–44)
AST: 12 U/L — ABNORMAL LOW (ref 15–41)
Albumin: 3.2 g/dL — ABNORMAL LOW (ref 3.5–5.0)
Alkaline Phosphatase: 49 U/L (ref 38–126)
Anion gap: 7 (ref 5–15)
BUN: 19 mg/dL (ref 8–23)
CO2: 24 mmol/L (ref 22–32)
Calcium: 9.1 mg/dL (ref 8.9–10.3)
Chloride: 102 mmol/L (ref 98–111)
Creatinine, Ser: 1.23 mg/dL (ref 0.61–1.24)
GFR, Estimated: 57 mL/min — ABNORMAL LOW
Glucose, Bld: 137 mg/dL — ABNORMAL HIGH (ref 70–99)
Potassium: 4.5 mmol/L (ref 3.5–5.1)
Sodium: 133 mmol/L — ABNORMAL LOW (ref 135–145)
Total Bilirubin: 0.6 mg/dL (ref 0.3–1.2)
Total Protein: 7 g/dL (ref 6.5–8.1)

## 2021-09-03 NOTE — Progress Notes (Signed)
   Anesthesia note:  Bowel prep reminder:Yes reviewed with Pt and Son  PCP - Dr. Rogers Blocker Cardiologist -none Other-   Chest x-ray - 08/01/21-epic EKG - 09/03/21-chart Stress Test - no ECHO - no Cardiac Cath - NA  Pacemaker/ICD device last checked:NA  Sleep Study - no CPAP -   Pt is pre diabetic-NA Fasting Blood Sugar -  Checks Blood Sugar _____  Blood Thinner:NA Blood Thinner Instructions: Aspirin Instructions: Last Dose:  Anesthesia review: no  Patient denies shortness of breath, fever, cough and chest pain at PAT appointment Pt is weak because of the cancer but not SOB.   Patient verbalized understanding of instructions that were given to them at the PAT appointment. Patient was also instructed that they will need to review over the PAT instructions again at home before surgery. Yes. His Son was with him at PAT visit

## 2021-09-13 ENCOUNTER — Encounter (HOSPITAL_COMMUNITY): Payer: Self-pay | Admitting: Emergency Medicine

## 2021-09-13 ENCOUNTER — Other Ambulatory Visit: Payer: Self-pay

## 2021-09-13 ENCOUNTER — Inpatient Hospital Stay (HOSPITAL_COMMUNITY)
Admission: EM | Admit: 2021-09-13 | Discharge: 2021-09-22 | DRG: 327 | Disposition: A | Discharge status: Home / Self Care | Payer: Medicare Other | Attending: Surgery | Admitting: Surgery

## 2021-09-13 ENCOUNTER — Inpatient Hospital Stay (HOSPITAL_COMMUNITY): Payer: Medicare Other

## 2021-09-13 DIAGNOSIS — D500 Iron deficiency anemia secondary to blood loss (chronic): Secondary | ICD-10-CM | POA: Diagnosis present

## 2021-09-13 DIAGNOSIS — K567 Ileus, unspecified: Secondary | ICD-10-CM | POA: Diagnosis not present

## 2021-09-13 DIAGNOSIS — E871 Hypo-osmolality and hyponatremia: Secondary | ICD-10-CM | POA: Diagnosis not present

## 2021-09-13 DIAGNOSIS — Z79890 Hormone replacement therapy (postmenopausal): Secondary | ICD-10-CM | POA: Diagnosis not present

## 2021-09-13 DIAGNOSIS — N1831 Chronic kidney disease, stage 3a: Secondary | ICD-10-CM | POA: Diagnosis present

## 2021-09-13 DIAGNOSIS — Z79899 Other long term (current) drug therapy: Secondary | ICD-10-CM | POA: Diagnosis not present

## 2021-09-13 DIAGNOSIS — K219 Gastro-esophageal reflux disease without esophagitis: Secondary | ICD-10-CM | POA: Diagnosis present

## 2021-09-13 DIAGNOSIS — I10 Essential (primary) hypertension: Secondary | ICD-10-CM | POA: Diagnosis present

## 2021-09-13 DIAGNOSIS — H8110 Benign paroxysmal vertigo, unspecified ear: Secondary | ICD-10-CM | POA: Diagnosis present

## 2021-09-13 DIAGNOSIS — I129 Hypertensive chronic kidney disease with stage 1 through stage 4 chronic kidney disease, or unspecified chronic kidney disease: Secondary | ICD-10-CM | POA: Diagnosis present

## 2021-09-13 DIAGNOSIS — N183 Chronic kidney disease, stage 3 unspecified: Secondary | ICD-10-CM | POA: Diagnosis present

## 2021-09-13 DIAGNOSIS — Z8249 Family history of ischemic heart disease and other diseases of the circulatory system: Secondary | ICD-10-CM

## 2021-09-13 DIAGNOSIS — Z5331 Laparoscopic surgical procedure converted to open procedure: Secondary | ICD-10-CM | POA: Diagnosis not present

## 2021-09-13 DIAGNOSIS — K6389 Other specified diseases of intestine: Secondary | ICD-10-CM | POA: Diagnosis not present

## 2021-09-13 DIAGNOSIS — H7093 Unspecified mastoiditis, bilateral: Secondary | ICD-10-CM | POA: Diagnosis not present

## 2021-09-13 DIAGNOSIS — E860 Dehydration: Secondary | ICD-10-CM | POA: Diagnosis present

## 2021-09-13 DIAGNOSIS — E119 Type 2 diabetes mellitus without complications: Secondary | ICD-10-CM

## 2021-09-13 DIAGNOSIS — Z9049 Acquired absence of other specified parts of digestive tract: Secondary | ICD-10-CM

## 2021-09-13 DIAGNOSIS — C182 Malignant neoplasm of ascending colon: Secondary | ICD-10-CM | POA: Diagnosis present

## 2021-09-13 DIAGNOSIS — Z800 Family history of malignant neoplasm of digestive organs: Secondary | ICD-10-CM | POA: Diagnosis not present

## 2021-09-13 DIAGNOSIS — E039 Hypothyroidism, unspecified: Secondary | ICD-10-CM | POA: Diagnosis present

## 2021-09-13 DIAGNOSIS — R1110 Vomiting, unspecified: Secondary | ICD-10-CM | POA: Diagnosis not present

## 2021-09-13 DIAGNOSIS — E1122 Type 2 diabetes mellitus with diabetic chronic kidney disease: Secondary | ICD-10-CM | POA: Diagnosis present

## 2021-09-13 DIAGNOSIS — R55 Syncope and collapse: Secondary | ICD-10-CM

## 2021-09-13 DIAGNOSIS — D630 Anemia in neoplastic disease: Secondary | ICD-10-CM | POA: Diagnosis not present

## 2021-09-13 DIAGNOSIS — Z87891 Personal history of nicotine dependence: Secondary | ICD-10-CM

## 2021-09-13 LAB — CBC WITH DIFFERENTIAL/PLATELET
Abs Immature Granulocytes: 0.04 10*3/uL (ref 0.00–0.07)
Basophils Absolute: 0 10*3/uL (ref 0.0–0.1)
Basophils Relative: 0 %
Eosinophils Absolute: 0 10*3/uL (ref 0.0–0.5)
Eosinophils Relative: 0 %
HCT: 24.3 % — ABNORMAL LOW (ref 39.0–52.0)
Hemoglobin: 7.6 g/dL — ABNORMAL LOW (ref 13.0–17.0)
Immature Granulocytes: 0 %
Lymphocytes Relative: 6 %
Lymphs Abs: 0.6 10*3/uL — ABNORMAL LOW (ref 0.7–4.0)
MCH: 25.5 pg — ABNORMAL LOW (ref 26.0–34.0)
MCHC: 31.3 g/dL (ref 30.0–36.0)
MCV: 81.5 fL (ref 80.0–100.0)
Monocytes Absolute: 0.5 10*3/uL (ref 0.1–1.0)
Monocytes Relative: 6 %
Neutro Abs: 8.3 10*3/uL — ABNORMAL HIGH (ref 1.7–7.7)
Neutrophils Relative %: 88 %
Platelets: 369 10*3/uL (ref 150–400)
RBC: 2.98 MIL/uL — ABNORMAL LOW (ref 4.22–5.81)
RDW: 14.8 % (ref 11.5–15.5)
WBC: 9.5 10*3/uL (ref 4.0–10.5)
nRBC: 0 % (ref 0.0–0.2)

## 2021-09-13 LAB — COMPREHENSIVE METABOLIC PANEL
ALT: 10 U/L (ref 0–44)
AST: 13 U/L — ABNORMAL LOW (ref 15–41)
Albumin: 3.3 g/dL — ABNORMAL LOW (ref 3.5–5.0)
Alkaline Phosphatase: 50 U/L (ref 38–126)
Anion gap: 10 (ref 5–15)
BUN: 18 mg/dL (ref 8–23)
CO2: 23 mmol/L (ref 22–32)
Calcium: 9.5 mg/dL (ref 8.9–10.3)
Chloride: 99 mmol/L (ref 98–111)
Creatinine, Ser: 1.37 mg/dL — ABNORMAL HIGH (ref 0.61–1.24)
GFR, Estimated: 50 mL/min — ABNORMAL LOW (ref >60)
Glucose, Bld: 161 mg/dL — ABNORMAL HIGH (ref 70–99)
Potassium: 3.9 mmol/L (ref 3.5–5.1)
Sodium: 132 mmol/L — ABNORMAL LOW (ref 135–145)
Total Bilirubin: 0.9 mg/dL (ref 0.3–1.2)
Total Protein: 7.2 g/dL (ref 6.5–8.1)

## 2021-09-13 LAB — PROTIME-INR
INR: 1.2 (ref 0.8–1.2)
Prothrombin Time: 14.9 seconds (ref 11.4–15.2)

## 2021-09-13 LAB — CBG MONITORING, ED: Glucose-Capillary: 158 mg/dL — ABNORMAL HIGH (ref 70–99)

## 2021-09-13 LAB — TROPONIN I (HIGH SENSITIVITY)
Troponin I (High Sensitivity): 3 ng/L (ref <18)
Troponin I (High Sensitivity): 4 ng/L (ref <18)

## 2021-09-13 LAB — PREPARE RBC (CROSSMATCH)

## 2021-09-13 MED ORDER — ONDANSETRON 4 MG PO TBDP: 4.0000 mg | ORAL_TABLET | Freq: Four times a day (QID) | ORAL | Status: DC | PRN

## 2021-09-13 MED ORDER — KCL IN DEXTROSE-NACL 20-5-0.9 MEQ/L-%-% IV SOLN
INTRAVENOUS | Status: DC
  Filled 2021-09-13 (×2): qty 1000

## 2021-09-13 MED ORDER — ONDANSETRON HCL 4 MG/2ML IJ SOLN: 4.0000 mg | Freq: Four times a day (QID) | INTRAMUSCULAR | Status: DC | PRN

## 2021-09-13 MED ORDER — LEVOTHYROXINE SODIUM 75 MCG PO TABS: 75.0000 ug | ORAL_TABLET | Freq: Every day | ORAL | Status: DC

## 2021-09-13 MED ORDER — LISINOPRIL 20 MG PO TABS: 20.0000 mg | ORAL_TABLET | Freq: Every day | ORAL | Status: DC

## 2021-09-13 MED ORDER — ENOXAPARIN SODIUM 40 MG/0.4ML IJ SOSY
40.0000 mg | PREFILLED_SYRINGE | INTRAMUSCULAR | Status: DC
  Filled 2021-09-13: qty 0.4

## 2021-09-13 MED ORDER — SODIUM CHLORIDE 0.9% IV SOLUTION: Freq: Once | INTRAVENOUS | Status: DC

## 2021-09-13 MED ORDER — CHLORHEXIDINE GLUCONATE CLOTH 2 % EX PADS
6.0000 | MEDICATED_PAD | Freq: Once | CUTANEOUS | Status: AC
  Administered 2021-09-13: 6 via TOPICAL

## 2021-09-13 NOTE — Progress Notes (Signed)
Received Pt from the ED, alert and oriented x 3 accompanied by family member in the stretcher. Pt ambulated to the Bathroom with minimal assistance. Assisted Pt back to the bed, given orientation to the unit and explained how to use call bell. Pt stated he is feeling better. VSS. Monitoring Pt closely.

## 2021-09-13 NOTE — ED Triage Notes (Signed)
Patient BIB children, reports scheduled for colon cancer surgery in the morning. States weakness for a long time worsening today with syncopal episode. States he has been doing bowel prep for surgery. Family reports he has long history of being anemic as a result of the cancer.

## 2021-09-14 ENCOUNTER — Inpatient Hospital Stay (HOSPITAL_COMMUNITY): Payer: Medicare Other | Admitting: Certified Registered Nurse Anesthetist

## 2021-09-14 ENCOUNTER — Encounter (HOSPITAL_COMMUNITY)
Admission: EM | Disposition: A | Discharge status: Home / Self Care | Payer: Self-pay | Source: Home / Self Care | Attending: Surgery

## 2021-09-14 ENCOUNTER — Inpatient Hospital Stay (HOSPITAL_COMMUNITY): Admission: RE | Admit: 2021-09-14 | Payer: Medicare Other | Source: Home / Self Care | Admitting: Surgery

## 2021-09-14 ENCOUNTER — Other Ambulatory Visit: Payer: Self-pay

## 2021-09-14 ENCOUNTER — Inpatient Hospital Stay (HOSPITAL_COMMUNITY): Payer: Medicare Other | Admitting: Physician Assistant

## 2021-09-14 ENCOUNTER — Encounter (HOSPITAL_COMMUNITY): Payer: Self-pay | Admitting: Surgery

## 2021-09-14 DIAGNOSIS — D630 Anemia in neoplastic disease: Secondary | ICD-10-CM

## 2021-09-14 DIAGNOSIS — Z9049 Acquired absence of other specified parts of digestive tract: Secondary | ICD-10-CM

## 2021-09-14 DIAGNOSIS — Z87891 Personal history of nicotine dependence: Secondary | ICD-10-CM

## 2021-09-14 DIAGNOSIS — I10 Essential (primary) hypertension: Secondary | ICD-10-CM

## 2021-09-14 DIAGNOSIS — C182 Malignant neoplasm of ascending colon: Secondary | ICD-10-CM

## 2021-09-14 DIAGNOSIS — Z01818 Encounter for other preprocedural examination: Secondary | ICD-10-CM

## 2021-09-14 HISTORY — PX: LAPAROSCOPIC RIGHT HEMI COLECTOMY: SHX5926

## 2021-09-14 LAB — BASIC METABOLIC PANEL
Anion gap: 8 (ref 5–15)
BUN: 16 mg/dL (ref 8–23)
CO2: 24 mmol/L (ref 22–32)
Calcium: 9 mg/dL (ref 8.9–10.3)
Chloride: 102 mmol/L (ref 98–111)
Creatinine, Ser: 1.2 mg/dL (ref 0.61–1.24)
GFR, Estimated: 59 mL/min — ABNORMAL LOW (ref >60)
Glucose, Bld: 141 mg/dL — ABNORMAL HIGH (ref 70–99)
Potassium: 4 mmol/L (ref 3.5–5.1)
Sodium: 134 mmol/L — ABNORMAL LOW (ref 135–145)

## 2021-09-14 LAB — CBC
HCT: 25.6 % — ABNORMAL LOW (ref 39.0–52.0)
Hemoglobin: 8.1 g/dL — ABNORMAL LOW (ref 13.0–17.0)
MCH: 26.3 pg (ref 26.0–34.0)
MCHC: 31.6 g/dL (ref 30.0–36.0)
MCV: 83.1 fL (ref 80.0–100.0)
Platelets: 310 10*3/uL (ref 150–400)
RBC: 3.08 MIL/uL — ABNORMAL LOW (ref 4.22–5.81)
RDW: 15.3 % (ref 11.5–15.5)
WBC: 5.6 10*3/uL (ref 4.0–10.5)
nRBC: 0 % (ref 0.0–0.2)

## 2021-09-14 LAB — TYPE AND SCREEN
ABO/RH(D): O POS
ABO/RH(D): O POS
Antibody Screen: NEGATIVE
Antibody Screen: NEGATIVE
Unit division: 0

## 2021-09-14 LAB — BPAM RBC
Blood Product Expiration Date: 202307172359
ISSUE DATE / TIME: 202306251954
Unit Type and Rh: 5100

## 2021-09-14 SURGERY — LAPAROSCOPIC RIGHT HEMI COLECTOMY
Anesthesia: General | Laterality: Right

## 2021-09-14 MED ORDER — BISACODYL 5 MG PO TBEC: 20.0000 mg | DELAYED_RELEASE_TABLET | Freq: Once | ORAL | Status: DC

## 2021-09-14 MED ORDER — IBUPROFEN 400 MG PO TABS: 600.0000 mg | ORAL_TABLET | Freq: Four times a day (QID) | ORAL | Status: DC | PRN

## 2021-09-14 MED ORDER — ACETAMINOPHEN 500 MG PO TABS
1000.0000 mg | ORAL_TABLET | ORAL | Status: AC
  Administered 2021-09-14: 1000 mg via ORAL
  Filled 2021-09-14: qty 2

## 2021-09-14 MED ORDER — PHENYLEPHRINE HCL-NACL 20-0.9 MG/250ML-% IV SOLN
INTRAVENOUS | Status: DC | PRN
  Administered 2021-09-14: 30 ug/min via INTRAVENOUS

## 2021-09-14 MED ORDER — HYDROMORPHONE HCL 1 MG/ML IJ SOLN: 0.2500 mg | INTRAMUSCULAR | Status: DC | PRN

## 2021-09-14 MED ORDER — DEXAMETHASONE SODIUM PHOSPHATE 10 MG/ML IJ SOLN
INTRAMUSCULAR | Status: AC
  Filled 2021-09-14: qty 1

## 2021-09-14 MED ORDER — DIPHENHYDRAMINE HCL 12.5 MG/5ML PO ELIX: 12.5000 mg | ORAL_SOLUTION | Freq: Four times a day (QID) | ORAL | Status: DC | PRN

## 2021-09-14 MED ORDER — BUPIVACAINE LIPOSOME 1.3 % IJ SUSP: 20.0000 mL | Freq: Once | INTRAMUSCULAR | Status: DC

## 2021-09-14 MED ORDER — FERROUS SULFATE 325 (65 FE) MG PO TABS
325.0000 mg | ORAL_TABLET | Freq: Every day | ORAL | Status: DC
  Administered 2021-09-14 – 2021-09-19 (×5): 325 mg via ORAL
  Filled 2021-09-14 (×5): qty 1

## 2021-09-14 MED ORDER — ENSURE SURGERY PO LIQD
237.0000 mL | Freq: Two times a day (BID) | ORAL | Status: DC
  Administered 2021-09-14 – 2021-09-22 (×7): 237 mL via ORAL

## 2021-09-14 MED ORDER — METRONIDAZOLE 500 MG PO TABS: 1000.0000 mg | ORAL_TABLET | ORAL | Status: DC

## 2021-09-14 MED ORDER — FENTANYL CITRATE (PF) 100 MCG/2ML IJ SOLN
INTRAMUSCULAR | Status: DC | PRN
  Administered 2021-09-14 (×4): 50 ug via INTRAVENOUS

## 2021-09-14 MED ORDER — PROPOFOL 10 MG/ML IV BOLUS
INTRAVENOUS | Status: DC | PRN
  Administered 2021-09-14: 80 mg via INTRAVENOUS

## 2021-09-14 MED ORDER — ASCORBIC ACID 500 MG PO TABS
500.0000 mg | ORAL_TABLET | Freq: Every day | ORAL | Status: DC
  Administered 2021-09-14 – 2021-09-22 (×7): 500 mg via ORAL
  Filled 2021-09-14 (×7): qty 1

## 2021-09-14 MED ORDER — ONDANSETRON HCL 4 MG/2ML IJ SOLN
4.0000 mg | Freq: Four times a day (QID) | INTRAMUSCULAR | Status: DC | PRN
  Administered 2021-09-17 – 2021-09-19 (×5): 4 mg via INTRAVENOUS
  Filled 2021-09-14 (×6): qty 2

## 2021-09-14 MED ORDER — ONDANSETRON HCL 4 MG/2ML IJ SOLN
INTRAMUSCULAR | Status: DC | PRN
  Administered 2021-09-14: 4 mg via INTRAVENOUS

## 2021-09-14 MED ORDER — CHLORHEXIDINE GLUCONATE CLOTH 2 % EX PADS: 6.0000 | MEDICATED_PAD | Freq: Once | CUTANEOUS | Status: DC

## 2021-09-14 MED ORDER — BUPIVACAINE LIPOSOME 1.3 % IJ SUSP
INTRAMUSCULAR | Status: AC
  Filled 2021-09-14: qty 20

## 2021-09-14 MED ORDER — ALUM & MAG HYDROXIDE-SIMETH 200-200-20 MG/5ML PO SUSP
30.0000 mL | Freq: Four times a day (QID) | ORAL | Status: DC | PRN
  Administered 2021-09-17: 30 mL via ORAL
  Filled 2021-09-14: qty 30

## 2021-09-14 MED ORDER — PROPOFOL 10 MG/ML IV BOLUS
INTRAVENOUS | Status: AC
Start: 2021-09-14 — End: ?
  Filled 2021-09-14: qty 20

## 2021-09-14 MED ORDER — POLYETHYLENE GLYCOL 3350 17 GM/SCOOP PO POWD: 1.0000 | Freq: Once | ORAL | Status: DC

## 2021-09-14 MED ORDER — LACTATED RINGERS IV SOLN: INTRAVENOUS | Status: DC | PRN

## 2021-09-14 MED ORDER — HYDRALAZINE HCL 20 MG/ML IJ SOLN: 10.0000 mg | INTRAMUSCULAR | Status: DC | PRN

## 2021-09-14 MED ORDER — ORAL CARE MOUTH RINSE: 15.0000 mL | Freq: Once | OROMUCOSAL | Status: AC

## 2021-09-14 MED ORDER — LIDOCAINE HCL 2 % IJ SOLN
INTRAMUSCULAR | Status: AC
  Filled 2021-09-14: qty 40

## 2021-09-14 MED ORDER — FENTANYL CITRATE (PF) 100 MCG/2ML IJ SOLN
INTRAMUSCULAR | Status: AC
  Filled 2021-09-14: qty 2

## 2021-09-14 MED ORDER — SUGAMMADEX SODIUM 200 MG/2ML IV SOLN
INTRAVENOUS | Status: DC | PRN
  Administered 2021-09-14: 200 mg via INTRAVENOUS

## 2021-09-14 MED ORDER — DEXAMETHASONE SODIUM PHOSPHATE 10 MG/ML IJ SOLN
INTRAMUSCULAR | Status: DC | PRN
  Administered 2021-09-14: 10 mg via INTRAVENOUS

## 2021-09-14 MED ORDER — BUPIVACAINE-EPINEPHRINE 0.25% -1:200000 IJ SOLN
INTRAMUSCULAR | Status: DC | PRN
  Administered 2021-09-14: 30 mL

## 2021-09-14 MED ORDER — HEPARIN SODIUM (PORCINE) 5000 UNIT/ML IJ SOLN
5000.0000 [IU] | Freq: Three times a day (TID) | INTRAMUSCULAR | Status: DC
  Administered 2021-09-14 – 2021-09-22 (×23): 5000 [IU] via SUBCUTANEOUS
  Filled 2021-09-14 (×23): qty 1

## 2021-09-14 MED ORDER — NEOMYCIN SULFATE 500 MG PO TABS: 1000.0000 mg | ORAL_TABLET | ORAL | Status: DC

## 2021-09-14 MED ORDER — PANTOPRAZOLE SODIUM 40 MG PO TBEC
40.0000 mg | DELAYED_RELEASE_TABLET | Freq: Every day | ORAL | Status: DC
  Administered 2021-09-15 – 2021-09-17 (×3): 40 mg via ORAL
  Filled 2021-09-14 (×3): qty 1

## 2021-09-14 MED ORDER — HEPARIN SODIUM (PORCINE) 5000 UNIT/ML IJ SOLN
5000.0000 [IU] | Freq: Once | INTRAMUSCULAR | Status: AC
  Administered 2021-09-14: 5000 [IU] via SUBCUTANEOUS
  Filled 2021-09-14: qty 1

## 2021-09-14 MED ORDER — ENSURE PRE-SURGERY PO LIQD: 592.0000 mL | Freq: Once | ORAL | Status: DC

## 2021-09-14 MED ORDER — ONDANSETRON HCL 4 MG/2ML IJ SOLN
4.0000 mg | Freq: Once | INTRAMUSCULAR | Status: DC | PRN
Start: 2021-09-14 — End: 2021-09-14

## 2021-09-14 MED ORDER — LACTATED RINGERS IV SOLN: INTRAVENOUS | Status: DC

## 2021-09-14 MED ORDER — ONDANSETRON HCL 4 MG PO TABS: 4.0000 mg | ORAL_TABLET | Freq: Four times a day (QID) | ORAL | Status: DC | PRN

## 2021-09-14 MED ORDER — LIDOCAINE 2% (20 MG/ML) 5 ML SYRINGE
INTRAMUSCULAR | Status: DC | PRN
  Administered 2021-09-14: 100 mg via INTRAVENOUS

## 2021-09-14 MED ORDER — TRAMADOL HCL 50 MG PO TABS
50.0000 mg | ORAL_TABLET | Freq: Four times a day (QID) | ORAL | Status: DC | PRN
  Administered 2021-09-14 (×2): 50 mg via ORAL
  Filled 2021-09-14 (×4): qty 1

## 2021-09-14 MED ORDER — BUPIVACAINE LIPOSOME 1.3 % IJ SUSP
INTRAMUSCULAR | Status: DC | PRN
  Administered 2021-09-14: 20 mL

## 2021-09-14 MED ORDER — SIMETHICONE 80 MG PO CHEW: 40.0000 mg | CHEWABLE_TABLET | Freq: Four times a day (QID) | ORAL | Status: DC | PRN

## 2021-09-14 MED ORDER — ALVIMOPAN 12 MG PO CAPS
12.0000 mg | ORAL_CAPSULE | ORAL | Status: AC
  Administered 2021-09-14: 12 mg via ORAL
  Filled 2021-09-14: qty 1

## 2021-09-14 MED ORDER — HYDROMORPHONE HCL 1 MG/ML IJ SOLN: 0.5000 mg | INTRAMUSCULAR | Status: DC | PRN

## 2021-09-14 MED ORDER — LIDOCAINE HCL (PF) 2 % IJ SOLN
INTRAMUSCULAR | Status: AC
  Filled 2021-09-14: qty 5

## 2021-09-14 MED ORDER — SODIUM CHLORIDE 0.9 % IR SOLN
Status: DC | PRN
  Administered 2021-09-14: 2000 mL

## 2021-09-14 MED ORDER — PHENYLEPHRINE HCL-NACL 20-0.9 MG/250ML-% IV SOLN
INTRAVENOUS | Status: AC
  Filled 2021-09-14: qty 750

## 2021-09-14 MED ORDER — ONDANSETRON HCL 4 MG/2ML IJ SOLN
INTRAMUSCULAR | Status: AC
  Filled 2021-09-14: qty 2

## 2021-09-14 MED ORDER — SODIUM CHLORIDE 0.9 % IV SOLN
2.0000 g | INTRAVENOUS | Status: AC
  Administered 2021-09-14: 2 g via INTRAVENOUS
  Filled 2021-09-14: qty 2

## 2021-09-14 MED ORDER — ACETAMINOPHEN 500 MG PO TABS
1000.0000 mg | ORAL_TABLET | Freq: Four times a day (QID) | ORAL | Status: AC
  Administered 2021-09-14 – 2021-09-21 (×15): 1000 mg via ORAL
  Filled 2021-09-14 (×18): qty 2

## 2021-09-14 MED ORDER — ROCURONIUM BROMIDE 10 MG/ML (PF) SYRINGE
PREFILLED_SYRINGE | INTRAVENOUS | Status: AC
  Filled 2021-09-14: qty 10

## 2021-09-14 MED ORDER — PHENYLEPHRINE 80 MCG/ML (10ML) SYRINGE FOR IV PUSH (FOR BLOOD PRESSURE SUPPORT)
PREFILLED_SYRINGE | INTRAVENOUS | Status: AC
  Filled 2021-09-14: qty 10

## 2021-09-14 MED ORDER — ENSURE PRE-SURGERY PO LIQD: 296.0000 mL | Freq: Once | ORAL | Status: DC

## 2021-09-14 MED ORDER — ROCURONIUM BROMIDE 10 MG/ML (PF) SYRINGE
PREFILLED_SYRINGE | INTRAVENOUS | Status: DC | PRN
  Administered 2021-09-14: 20 mg via INTRAVENOUS
  Administered 2021-09-14: 70 mg via INTRAVENOUS

## 2021-09-14 MED ORDER — DIPHENHYDRAMINE HCL 50 MG/ML IJ SOLN: 12.5000 mg | Freq: Four times a day (QID) | INTRAMUSCULAR | Status: DC | PRN

## 2021-09-14 MED ORDER — ALVIMOPAN 12 MG PO CAPS
12.0000 mg | ORAL_CAPSULE | Freq: Two times a day (BID) | ORAL | Status: DC
  Administered 2021-09-15: 12 mg via ORAL
  Filled 2021-09-14: qty 1

## 2021-09-14 MED ORDER — BUPIVACAINE-EPINEPHRINE 0.25% -1:200000 IJ SOLN
INTRAMUSCULAR | Status: AC
  Filled 2021-09-14: qty 1

## 2021-09-14 MED ORDER — CHLORHEXIDINE GLUCONATE 0.12 % MT SOLN
15.0000 mL | Freq: Once | OROMUCOSAL | Status: AC
  Administered 2021-09-14: 15 mL via OROMUCOSAL

## 2021-09-14 MED ORDER — PHENYLEPHRINE 80 MCG/ML (10ML) SYRINGE FOR IV PUSH (FOR BLOOD PRESSURE SUPPORT)
PREFILLED_SYRINGE | INTRAVENOUS | Status: DC | PRN
  Administered 2021-09-14 (×2): 80 ug via INTRAVENOUS

## 2021-09-14 MED ORDER — LIDOCAINE HCL (PF) 2 % IJ SOLN
INTRAMUSCULAR | Status: DC | PRN
  Administered 2021-09-14: 1.5 mg/kg/h via INTRADERMAL

## 2021-09-14 SURGICAL SUPPLY — 72 items
ADH SKN CLS APL DERMABOND .7 (GAUZE/BANDAGES/DRESSINGS) ×1
APL PRP STRL LF DISP 70% ISPRP (MISCELLANEOUS) ×1
APPLIER CLIP ROT 10 11.4 M/L (STAPLE)
APR CLP MED LRG 11.4X10 (STAPLE)
BAG COUNTER SPONGE SURGICOUNT (BAG) IMPLANT
BAG SPNG CNTER NS LX DISP (BAG)
BLADE CLIPPER SURG (BLADE) IMPLANT
CABLE HIGH FREQUENCY MONO STRZ (ELECTRODE) ×3 IMPLANT
CELLS DAT CNTRL 66122 CELL SVR (MISCELLANEOUS) IMPLANT
CHLORAPREP W/TINT 26 (MISCELLANEOUS) ×2 IMPLANT
CLIP APPLIE ROT 10 11.4 M/L (STAPLE) IMPLANT
DERMABOND ADVANCED (GAUZE/BANDAGES/DRESSINGS) ×1
DERMABOND ADVANCED .7 DNX12 (GAUZE/BANDAGES/DRESSINGS) IMPLANT
DISSECTOR BLUNT TIP ENDO 5MM (MISCELLANEOUS) IMPLANT
ELECT PENCIL ROCKER SW 15FT (MISCELLANEOUS) ×1 IMPLANT
ELECT REM PT RETURN 15FT ADLT (MISCELLANEOUS) ×2 IMPLANT
GAUZE SPONGE 4X4 12PLY STRL (GAUZE/BANDAGES/DRESSINGS) ×2 IMPLANT
GLOVE BIO SURGEON STRL SZ7.5 (GLOVE) ×4 IMPLANT
GLOVE ECLIPSE 8.0 STRL XLNG CF (GLOVE) ×4 IMPLANT
GOWN STRL REUS W/ TWL XL LVL3 (GOWN DISPOSABLE) ×4 IMPLANT
GOWN STRL REUS W/TWL XL LVL3 (GOWN DISPOSABLE) ×8
IRRIG SUCT STRYKERFLOW 2 WTIP (MISCELLANEOUS) ×2
IRRIGATION SUCT STRKRFLW 2 WTP (MISCELLANEOUS) IMPLANT
KIT TURNOVER KIT A (KITS) IMPLANT
LIGASURE IMPACT 36 18CM CVD LR (INSTRUMENTS) IMPLANT
NS IRRIG 1000ML POUR BTL (IV SOLUTION) ×2 IMPLANT
PACK COLON (CUSTOM PROCEDURE TRAY) ×2 IMPLANT
PAD POSITIONING PINK XL (MISCELLANEOUS) ×1 IMPLANT
PENCIL SMOKE EVACUATOR (MISCELLANEOUS) IMPLANT
PROTECTOR NERVE ULNAR (MISCELLANEOUS) IMPLANT
RELOAD PROXIMATE 75MM BLUE (ENDOMECHANICALS) ×4 IMPLANT
RELOAD STAPLE 75 3.8 BLU REG (ENDOMECHANICALS) IMPLANT
RETRACTOR WND ALEXIS 18 MED (MISCELLANEOUS) IMPLANT
RETRACTOR WND ALEXIS 25 LRG (MISCELLANEOUS) IMPLANT
RTRCTR WOUND ALEXIS 18CM MED (MISCELLANEOUS)
RTRCTR WOUND ALEXIS 25CM LRG (MISCELLANEOUS) ×2
SCISSORS LAP 5X35 DISP (ENDOMECHANICALS) ×2 IMPLANT
SEALER TISSUE G2 STRG ARTC 35C (ENDOMECHANICALS) IMPLANT
SET TUBE SMOKE EVAC HIGH FLOW (TUBING) ×2 IMPLANT
SHEARS HARMONIC ACE PLUS 36CM (ENDOMECHANICALS) IMPLANT
SLEEVE ADV FIXATION 5X100MM (TROCAR) ×6 IMPLANT
SPIKE FLUID TRANSFER (MISCELLANEOUS) ×2 IMPLANT
STAPLER 90 3.5 STAND SLIM (STAPLE) ×2
STAPLER 90 3.5 STD SLIM (STAPLE) IMPLANT
STAPLER GUN LINEAR PROX 60 (STAPLE) IMPLANT
STAPLER PROXIMATE 75MM BLUE (STAPLE) ×1 IMPLANT
STAPLER VISISTAT 35W (STAPLE) ×2 IMPLANT
SUT MNCRL AB 4-0 PS2 18 (SUTURE) ×3 IMPLANT
SUT PDS AB 1 CT1 27 (SUTURE) ×4 IMPLANT
SUT PROLENE 2 0 CT2 30 (SUTURE) IMPLANT
SUT PROLENE 2 0 KS (SUTURE) IMPLANT
SUT SILK 2 0 (SUTURE) ×2
SUT SILK 2 0 SH CR/8 (SUTURE) ×2 IMPLANT
SUT SILK 2-0 18XBRD TIE 12 (SUTURE) ×1 IMPLANT
SUT SILK 3 0 (SUTURE) ×2
SUT SILK 3 0 SH CR/8 (SUTURE) ×2 IMPLANT
SUT SILK 3-0 18XBRD TIE 12 (SUTURE) ×1 IMPLANT
SUT VICRYL 0 UR6 27IN ABS (SUTURE) ×1 IMPLANT
SYS LAPSCP GELPORT 120MM (MISCELLANEOUS)
SYS WOUND ALEXIS 18CM MED (MISCELLANEOUS) ×2
SYSTEM LAPSCP GELPORT 120MM (MISCELLANEOUS) IMPLANT
SYSTEM WOUND ALEXIS 18CM MED (MISCELLANEOUS) ×1 IMPLANT
TAPE CLOTH 4X10 WHT NS (GAUZE/BANDAGES/DRESSINGS) IMPLANT
TOWEL OR 17X26 10 PK STRL BLUE (TOWEL DISPOSABLE) ×2 IMPLANT
TRAY FOLEY MTR SLVR 14FR STAT (SET/KITS/TRAYS/PACK) ×1 IMPLANT
TRAY FOLEY MTR SLVR 16FR STAT (SET/KITS/TRAYS/PACK) ×1 IMPLANT
TROCAR 11X100 Z THREAD (TROCAR) IMPLANT
TROCAR ADV FIXATION 12X100MM (TROCAR) ×2 IMPLANT
TROCAR ADV FIXATION 5X100MM (TROCAR) ×2 IMPLANT
TROCAR BALLN 12MMX100 BLUNT (TROCAR) ×2 IMPLANT
TUBING CONNECTING 10 (TUBING) ×4 IMPLANT
YANKAUER SUCT BULB TIP NO VENT (SUCTIONS) ×2 IMPLANT

## 2021-09-14 NOTE — Transfer of Care (Signed)
Immediate Anesthesia Transfer of Care Note  Patient: Joseph Pennington  Procedure(s) Performed: LAPAROSCOPIC TO OPEN RIGHT HEMI COLECTOMY (Right)  Patient Location: PACU  Anesthesia Type:General  Level of Consciousness: awake, alert  and oriented  Airway & Oxygen Therapy: Patient Spontanous Breathing and Patient connected to face mask oxygen  Post-op Assessment: Report given to RN and Post -op Vital signs reviewed and stable  Post vital signs: Reviewed and stable  Last Vitals:  Vitals Value Taken Time  BP 99/58 09/14/21 1023  Temp    Pulse 77 09/14/21 1025  Resp 19 09/14/21 1025  SpO2 100 % 09/14/21 1025  Vitals shown include unvalidated device data.  Last Pain:  Vitals:   09/14/21 0651  TempSrc:   PainSc: 0-No pain         Complications: No notable events documented.

## 2021-09-14 NOTE — Anesthesia Postprocedure Evaluation (Signed)
Anesthesia Post Note  Patient: Joseph Pennington  Procedure(s) Performed: LAPAROSCOPIC TO OPEN RIGHT HEMI COLECTOMY (Right)     Patient location during evaluation: PACU Anesthesia Type: General Level of consciousness: awake and alert Pain management: pain level controlled Vital Signs Assessment: post-procedure vital signs reviewed and stable Respiratory status: spontaneous breathing, nonlabored ventilation, respiratory function stable and patient connected to nasal cannula oxygen Cardiovascular status: blood pressure returned to baseline and stable Postop Assessment: no apparent nausea or vomiting Anesthetic complications: no   No notable events documented.  Last Vitals:  Vitals:   09/14/21 1100 09/14/21 1134  BP: 112/66 113/69  Pulse: 72 70  Resp: 13 16  Temp: 36.6 C 36.5 C  SpO2: 96% 99%    Last Pain:  Vitals:   09/14/21 1134  TempSrc: Oral  PainSc:                  Reef Achterberg S

## 2021-09-15 ENCOUNTER — Encounter (HOSPITAL_COMMUNITY): Payer: Self-pay | Admitting: Surgery

## 2021-09-15 LAB — BASIC METABOLIC PANEL
Anion gap: 8 (ref 5–15)
BUN: 15 mg/dL (ref 8–23)
CO2: 22 mmol/L (ref 22–32)
Calcium: 8.8 mg/dL — ABNORMAL LOW (ref 8.9–10.3)
Chloride: 102 mmol/L (ref 98–111)
Creatinine, Ser: 1.18 mg/dL (ref 0.61–1.24)
GFR, Estimated: 60 mL/min — ABNORMAL LOW (ref >60)
Glucose, Bld: 163 mg/dL — ABNORMAL HIGH (ref 70–99)
Potassium: 4.5 mmol/L (ref 3.5–5.1)
Sodium: 132 mmol/L — ABNORMAL LOW (ref 135–145)

## 2021-09-15 LAB — CBC
HCT: 27.4 % — ABNORMAL LOW (ref 39.0–52.0)
Hemoglobin: 8.4 g/dL — ABNORMAL LOW (ref 13.0–17.0)
MCH: 25.5 pg — ABNORMAL LOW (ref 26.0–34.0)
MCHC: 30.7 g/dL (ref 30.0–36.0)
MCV: 83.3 fL (ref 80.0–100.0)
Platelets: 288 10*3/uL (ref 150–400)
RBC: 3.29 MIL/uL — ABNORMAL LOW (ref 4.22–5.81)
RDW: 15.2 % (ref 11.5–15.5)
WBC: 9.9 10*3/uL (ref 4.0–10.5)
nRBC: 0 % (ref 0.0–0.2)

## 2021-09-15 MED ORDER — LEVOTHYROXINE SODIUM 75 MCG PO TABS
75.0000 ug | ORAL_TABLET | Freq: Every day | ORAL | Status: DC
  Administered 2021-09-15 – 2021-09-22 (×7): 75 ug via ORAL
  Filled 2021-09-15 (×7): qty 1

## 2021-09-15 MED ORDER — CHLORHEXIDINE GLUCONATE CLOTH 2 % EX PADS
6.0000 | MEDICATED_PAD | Freq: Every day | CUTANEOUS | Status: DC
  Administered 2021-09-17: 6 via TOPICAL

## 2021-09-16 ENCOUNTER — Other Ambulatory Visit: Payer: Self-pay

## 2021-09-16 ENCOUNTER — Other Ambulatory Visit (HOSPITAL_COMMUNITY): Payer: Self-pay

## 2021-09-16 LAB — CBC
HCT: 27.8 % — ABNORMAL LOW (ref 39.0–52.0)
Hemoglobin: 8.5 g/dL — ABNORMAL LOW (ref 13.0–17.0)
MCH: 25.8 pg — ABNORMAL LOW (ref 26.0–34.0)
MCHC: 30.6 g/dL (ref 30.0–36.0)
MCV: 84.2 fL (ref 80.0–100.0)
Platelets: 307 10*3/uL (ref 150–400)
RBC: 3.3 MIL/uL — ABNORMAL LOW (ref 4.22–5.81)
RDW: 15.3 % (ref 11.5–15.5)
WBC: 11.2 10*3/uL — ABNORMAL HIGH (ref 4.0–10.5)
nRBC: 0 % (ref 0.0–0.2)

## 2021-09-16 LAB — BASIC METABOLIC PANEL
Anion gap: 9 (ref 5–15)
BUN: 17 mg/dL (ref 8–23)
CO2: 23 mmol/L (ref 22–32)
Calcium: 8.8 mg/dL — ABNORMAL LOW (ref 8.9–10.3)
Chloride: 98 mmol/L (ref 98–111)
Creatinine, Ser: 1.09 mg/dL (ref 0.61–1.24)
GFR, Estimated: >60 mL/min (ref >60)
Glucose, Bld: 143 mg/dL — ABNORMAL HIGH (ref 70–99)
Potassium: 4.6 mmol/L (ref 3.5–5.1)
Sodium: 130 mmol/L — ABNORMAL LOW (ref 135–145)

## 2021-09-16 MED ORDER — SODIUM CHLORIDE 0.9 % IV SOLN: INTRAVENOUS | Status: DC

## 2021-09-16 MED ORDER — TRAMADOL HCL 50 MG PO TABS
50.0000 mg | ORAL_TABLET | Freq: Four times a day (QID) | ORAL | 0 refills | Status: AC | PRN
  Filled 2021-09-16: qty 15, 4d supply, fill #0

## 2021-09-16 NOTE — Care Management Important Message (Signed)
Important Message  Patient Details IM Letter given to the Patient. Name: Joseph Pennington MRN: [id number] Date of Birth: 1931-09-19   Medicare Important Message Given:  Yes     Kerin Salen 09/16/2021, 1:12 PM

## 2021-09-16 NOTE — Progress Notes (Signed)
  Subjective No acute events. Feeling well. Tolerating some soft food without nausea or vomiting - mild distention, depressed appetite. Ambulating. Has had 2 BM  Objective: Vital signs in last 24 hours: Temp:  [97.5 F (36.4 C)-98 F (36.7 C)] 98 F (36.7 C) (06/28 0456) Pulse Rate:  [81-95] 95 (06/28 0456) Resp:  [16-17] 17 (06/28 0456) BP: (113-128)/(68-75) 128/74 (06/28 0456) SpO2:  [97 %-100 %] 97 % (06/28 0456) Last BM Date : 09/15/21  Intake/Output from previous day: 06/27 0701 - 06/28 0700 In: 893.5 [P.O.:600; I.V.:293.5] Out: 400 [Urine:400] Intake/Output this shift: No intake/output data recorded.  Gen: NAD, comfortable CV: RRR Pulm: Normal work of breathing Abd: Soft, Nontender not significantly distended. Incisions c/d/I Ext: SCDs in place  Lab Results: CBC  Recent Labs    09/15/21 0430 09/16/21 0437  WBC 9.9 11.2*  HGB 8.4* 8.5*  HCT 27.4* 27.8*  PLT 288 307   BMET Recent Labs    09/15/21 0430 09/16/21 0437  NA 132* 130*  K 4.5 4.6  CL 102 98  CO2 22 23  GLUCOSE 163* 143*  BUN 15 17  CREATININE 1.18 1.09  CALCIUM 8.8* 8.8*   PT/INR Recent Labs    09/13/21 1818  LABPROT 14.9  INR 1.2   ABG No results for input(s): "PHART", "HCO3" in the last 72 hours.  Invalid input(s): "PCO2", "PO2"  Studies/Results:  Anti-infectives: Anti-infectives (From admission, onward)    Start     Dose/Rate Route Frequency Ordered Stop   09/14/21 1400  neomycin (MYCIFRADIN) tablet 1,000 mg  Status:  Discontinued       See Hyperspace for full Linked Orders Report.   1,000 mg Oral 3 times per day 09/14/21 0604 09/14/21 0606   09/14/21 1400  metroNIDAZOLE (FLAGYL) tablet 1,000 mg  Status:  Discontinued       See Hyperspace for full Linked Orders Report.   1,000 mg Oral 3 times per day 09/14/21 0604 09/14/21 0606   09/14/21 0615  cefoTEtan (CEFOTAN) 2 g in sodium chloride 0.9 % 100 mL IVPB        2 g 200 mL/hr over 30 Minutes Intravenous On call to O.R.  09/14/21 0604 09/14/21 0756        Assessment/Plan: Patient Active Problem List   Diagnosis Date Noted   S/P right hemicolectomy 09/14/2021   Colonic mass 09/13/2021   Malignant neoplasm of ascending colon ([place]) 08/06/2021   Acute cholecystitis 02/19/2018   s/p Procedure(s): LAPAROSCOPIC TO OPEN RIGHT HEMI COLECTOMY 09/14/2021  -Doing quite well -Soft diet; monitor; appetite hasn't really come back yet, mild bloating; will keep another day and see how he does on soft food today -Hyponatremia - restart MIVF - normal saline -Ppx: SQH, SCDs    LOS: 3 days   Nadeen Landau, MD [facility], [place]

## 2021-09-16 NOTE — Discharge Instructions (Signed)
POST OP INSTRUCTIONS AFTER COLON SURGERY  DIET: Be sure to include lots of fluids daily to stay hydrated - 64oz of water per day (8, 8 oz glasses).  Avoid fast food or heavy meals for the first couple of weeks as your are more likely to get nauseated. Avoid raw/uncooked fruits or vegetables for the first 4 weeks (its ok to have these if they are blended into smoothie form). If you have fruits/vegetables, make sure they are cooked until soft enough to mash on the roof of your mouth and chew your food well. Otherwise, diet as tolerated.  Take your usually prescribed home medications unless otherwise directed.  PAIN CONTROL: Pain is best controlled by a usual combination of three different methods TOGETHER: Ice/Heat Over the counter pain medication Prescription pain medication Most patients will experience some swelling and bruising around the surgical site.  Ice packs or heating pads (30-60 minutes up to 6 times a day) will help. Some people prefer to use ice alone, heat alone, alternating between ice & heat.  Experiment to what works for you.  Swelling and bruising can take several weeks to resolve.   It is helpful to take an over-the-counter pain medication regularly for the first few weeks: Ibuprofen (Motrin/Advil) - 200mg tabs - take 3 tabs (600mg) every 6 hours as needed for pain (unless you have been directed previously to avoid NSAIDs/ibuprofen) Acetaminophen (Tylenol) - you may take 650mg every 6 hours as needed. You can take this with motrin as they act differently on the body. If you are taking a narcotic pain medication that has acetaminophen in it, do not take over the counter tylenol at the same time. NOTE: You may take both of these medications together - most patients  find it most helpful when alternating between the two (i.e. Ibuprofen at 6am, tylenol at 9am, ibuprofen at 12pm ...) A  prescription for pain medication should be given to you upon discharge.  Take your pain medication as  prescribed if your pain is not adequatly controlled with the over-the-counter pain reliefs mentioned above.  Avoid getting constipated.  Between the surgery and the pain medications, it is common to experience some constipation.  Increasing fluid intake and taking a fiber supplement (such as Metamucil, Citrucel, FiberCon, MiraLax, etc) 1-2 times a day regularly will usually help prevent this problem from occurring.  A mild laxative (prune juice, Milk of Magnesia, MiraLax, etc) should be taken according to package directions if there are no bowel movements after 48 hours.    Dressing: Your incisions are covered in Dermabond which is like sterile superglue for the skin. This will come off on it's own in a couple weeks. It is waterproof and you may bathe normally starting the day after your surgery in a shower. Avoid baths/pools/lakes/oceans until your wounds have fully healed.  ACTIVITIES as tolerated:   Avoid heavy lifting (>10lbs or 1 gallon of milk) for the next 6 weeks. You may resume regular daily activities as tolerated--such as daily self-care, walking, climbing stairs--gradually increasing activities as tolerated.  If you can walk 30 minutes without difficulty, it is safe to try more intense activity such as jogging, treadmill, bicycling, low-impact aerobics.  DO NOT PUSH THROUGH PAIN.  Let pain be your guide: If it hurts to do something, don't do it. You may drive when you are no longer taking prescription pain medication, you can comfortably wear a seatbelt, and you can safely maneuver your car and apply brakes.  FOLLOW UP in our   office Please call [facility] at [phone] to set up an appointment to see your surgeon in the office for a follow-up appointment approximately 2 weeks after your surgery. Make sure that you call for this appointment the day you arrive home to insure a convenient appointment time.  9. If you have disability or family leave forms that need to be completed, you may have  them completed by your primary care physician's office; for return to work instructions, please ask our office staff and they will be happy to assist you in obtaining this documentation   When to call us [phone]: Poor pain control Reactions / problems with new medications (rash/itching, etc)  Fever over 101.5 F (38.5 C) Inability to urinate Nausea/vomiting Worsening swelling or bruising Continued bleeding from incision. Increased pain, redness, or drainage from the incision  The clinic staff is available to answer your questions during regular business hours (8:30am-5pm).  Please don't hesitate to call and ask to speak to one of our nurses for clinical concerns.   A surgeon from [facility] is always on call at the hospitals   If you have a medical emergency, go to the nearest emergency room or call 911.  [facility] [address], [address], [place], [place]  [address] MAIN: [phone] FAX: [phone] [email or web address]  

## 2021-09-17 ENCOUNTER — Other Ambulatory Visit (HOSPITAL_COMMUNITY): Payer: Self-pay

## 2021-09-17 LAB — BASIC METABOLIC PANEL
Anion gap: 8 (ref 5–15)
BUN: 15 mg/dL (ref 8–23)
CO2: 25 mmol/L (ref 22–32)
Calcium: 9.2 mg/dL (ref 8.9–10.3)
Chloride: 103 mmol/L (ref 98–111)
Creatinine, Ser: 1.11 mg/dL (ref 0.61–1.24)
GFR, Estimated: >60 mL/min (ref >60)
Glucose, Bld: 143 mg/dL — ABNORMAL HIGH (ref 70–99)
Potassium: 4.1 mmol/L (ref 3.5–5.1)
Sodium: 136 mmol/L (ref 135–145)

## 2021-09-17 MED ORDER — CYANOCOBALAMIN 1000 MCG/ML IJ SOLN
1000.0000 ug | Freq: Once | INTRAMUSCULAR | Status: AC
Start: 2021-09-17 — End: 2021-09-17
  Administered 2021-09-17: 1000 ug via INTRAMUSCULAR
  Filled 2021-09-17: qty 1

## 2021-09-17 NOTE — Plan of Care (Signed)
Problem: Activity: Goal: Ability to tolerate increased activity will improve Outcome: Progressing   Problem: Clinical Measurements: Goal: Postoperative complications will be avoided or minimized Outcome: Progressing   Problem: Pain Managment: Goal: General experience of comfort will improve Outcome: [place], RN 09/17/21 12:47 PM

## 2021-09-17 NOTE — Progress Notes (Signed)
  Subjective No acute events. Feeling well. Tolerating some soft food without nausea or vomiting - still with some distention, appetite improving. Ambulating. 2 BM 2d ago, none since.  Objective: Vital signs in last 24 hours: Temp:  [97.6 F (36.4 C)-97.9 F (36.6 C)] 97.9 F (36.6 C) (06/29 0512) Pulse Rate:  [86-91] 86 (06/29 0512) Resp:  [16-18] 18 (06/29 0512) BP: (126-133)/(75-81) 133/75 (06/29 0512) SpO2:  [99 %-100 %] 99 % (06/29 0512) Weight:  [94.2 kg] 94.2 kg (06/29 0500) Last BM Date : 09/15/21  Intake/Output from previous day: 06/28 0701 - 06/29 0700 In: 1724.4 [P.O.:360; I.V.:1364.4] Out: 2200 [Urine:2200] Intake/Output this shift: No intake/output data recorded.  Gen: NAD, comfortable CV: RRR Pulm: Normal work of breathing Abd: Soft, Nontender not significantly distended. Incisions c/d/I Ext: SCDs in place  Lab Results: CBC  Recent Labs    09/15/21 0430 09/16/21 0437  WBC 9.9 11.2*  HGB 8.4* 8.5*  HCT 27.4* 27.8*  PLT 288 307   BMET Recent Labs    09/16/21 0437 09/17/21 0441  NA 130* 136  K 4.6 4.1  CL 98 103  CO2 23 25  GLUCOSE 143* 143*  BUN 17 15  CREATININE 1.09 1.11  CALCIUM 8.8* 9.2   PT/INR No results for input(s): "LABPROT", "INR" in the last 72 hours.  ABG No results for input(s): "PHART", "HCO3" in the last 72 hours.  Invalid input(s): "PCO2", "PO2"  Studies/Results:  Anti-infectives: Anti-infectives (From admission, onward)    Start     Dose/Rate Route Frequency Ordered Stop   09/14/21 1400  neomycin (MYCIFRADIN) tablet 1,000 mg  Status:  Discontinued       See Hyperspace for full Linked Orders Report.   1,000 mg Oral 3 times per day 09/14/21 0604 09/14/21 0606   09/14/21 1400  metroNIDAZOLE (FLAGYL) tablet 1,000 mg  Status:  Discontinued       See Hyperspace for full Linked Orders Report.   1,000 mg Oral 3 times per day 09/14/21 0604 09/14/21 0606   09/14/21 0615  cefoTEtan (CEFOTAN) 2 g in sodium chloride 0.9 % 100  mL IVPB        2 g 200 mL/hr over 30 Minutes Intravenous On call to O.R. 09/14/21 0604 09/14/21 0756        Assessment/Plan: Patient Active Problem List   Diagnosis Date Noted   S/P right hemicolectomy 09/14/2021   Colonic mass 09/13/2021   Malignant neoplasm of ascending colon ([place]) 08/06/2021   Acute cholecystitis 02/19/2018   s/p Procedure(s): LAPAROSCOPIC TO OPEN RIGHT HEMI COLECTOMY 09/14/2021  -Doing quite well -Soft diet; monitor; still with distention and bloating but no n/v. Will plan to watch today -Hyponatremia - resolved, D/C IVF -Ppx: SQH, SCDs   LOS: 4 days   Nadeen Landau, MD [facility], [place]

## 2021-09-18 ENCOUNTER — Inpatient Hospital Stay (HOSPITAL_COMMUNITY): Payer: Medicare Other

## 2021-09-18 LAB — CBC WITH DIFFERENTIAL/PLATELET
Abs Immature Granulocytes: 0.05 10*3/uL (ref 0.00–0.07)
Basophils Absolute: 0 10*3/uL (ref 0.0–0.1)
Basophils Relative: 0 %
Eosinophils Absolute: 0.1 10*3/uL (ref 0.0–0.5)
Eosinophils Relative: 1 %
HCT: 28.4 % — ABNORMAL LOW (ref 39.0–52.0)
Hemoglobin: 8.7 g/dL — ABNORMAL LOW (ref 13.0–17.0)
Immature Granulocytes: 1 %
Lymphocytes Relative: 8 %
Lymphs Abs: 0.8 10*3/uL (ref 0.7–4.0)
MCH: 25.5 pg — ABNORMAL LOW (ref 26.0–34.0)
MCHC: 30.6 g/dL (ref 30.0–36.0)
MCV: 83.3 fL (ref 80.0–100.0)
Monocytes Absolute: 0.5 10*3/uL (ref 0.1–1.0)
Monocytes Relative: 5 %
Neutro Abs: 8.2 10*3/uL — ABNORMAL HIGH (ref 1.7–7.7)
Neutrophils Relative %: 85 %
Platelets: 406 10*3/uL — ABNORMAL HIGH (ref 150–400)
RBC: 3.41 MIL/uL — ABNORMAL LOW (ref 4.22–5.81)
RDW: 15.8 % — ABNORMAL HIGH (ref 11.5–15.5)
WBC: 9.6 10*3/uL (ref 4.0–10.5)
nRBC: 0 % (ref 0.0–0.2)

## 2021-09-18 LAB — BASIC METABOLIC PANEL
Anion gap: 8 (ref 5–15)
BUN: 22 mg/dL (ref 8–23)
CO2: 23 mmol/L (ref 22–32)
Calcium: 9.1 mg/dL (ref 8.9–10.3)
Chloride: 102 mmol/L (ref 98–111)
Creatinine, Ser: 1.07 mg/dL (ref 0.61–1.24)
GFR, Estimated: >60 mL/min (ref >60)
Glucose, Bld: 165 mg/dL — ABNORMAL HIGH (ref 70–99)
Potassium: 4.2 mmol/L (ref 3.5–5.1)
Sodium: 133 mmol/L — ABNORMAL LOW (ref 135–145)

## 2021-09-18 LAB — SURGICAL PATHOLOGY

## 2021-09-18 MED ORDER — PANTOPRAZOLE SODIUM 40 MG IV SOLR
40.0000 mg | Freq: Every day | INTRAVENOUS | Status: DC
  Administered 2021-09-18 – 2021-09-19 (×2): 40 mg via INTRAVENOUS
  Filled 2021-09-18 (×2): qty 10

## 2021-09-18 MED ORDER — KCL IN DEXTROSE-NACL 20-5-0.45 MEQ/L-%-% IV SOLN
INTRAVENOUS | Status: DC
  Filled 2021-09-18 (×3): qty 1000

## 2021-09-18 MED ORDER — ONDANSETRON HCL 4 MG/2ML IJ SOLN
4.0000 mg | Freq: Once | INTRAMUSCULAR | Status: AC
  Administered 2021-09-18: 4 mg via INTRAVENOUS

## 2021-09-18 MED ORDER — SODIUM CHLORIDE 0.9 % IV BOLUS
500.0000 mL | Freq: Once | INTRAVENOUS | Status: AC
  Administered 2021-09-18: 500 mL via INTRAVENOUS

## 2021-09-18 NOTE — Progress Notes (Signed)
  Subjective Nausea, vomiting at 0230 this morning. Feeling better now, not as nauseated. Denies any abdominal pain. Mild distention  Objective: Vital signs in last 24 hours: Temp:  [97.6 F (36.4 C)-98.1 F (36.7 C)] 97.6 F (36.4 C) (06/30 0636) Pulse Rate:  [92-94] 94 (06/30 0636) Resp:  [18-20] 19 (06/30 0636) BP: (132-141)/(79-83) 141/79 (06/30 0636) SpO2:  [98 %-99 %] 98 % (06/30 0636) Weight:  [91.2 kg] 91.2 kg (06/30 0500) Last BM Date : 09/15/21  Intake/Output from previous day: 06/29 0701 - 06/30 0700 In: 960 [P.O.:960] Out: 950 [Emesis/NG output:950] Intake/Output this shift: No intake/output data recorded.  Gen: NAD, comfortable CV: RRR Pulm: Normal work of breathing Abd: Soft, nontender, mild distention. Incisions c/d/I Ext: SCDs in place  Lab Results: CBC  Recent Labs    09/16/21 0437  WBC 11.2*  HGB 8.5*  HCT 27.8*  PLT 307   BMET Recent Labs    09/16/21 0437 09/17/21 0441  NA 130* 136  K 4.6 4.1  CL 98 103  CO2 23 25  GLUCOSE 143* 143*  BUN 17 15  CREATININE 1.09 1.11  CALCIUM 8.8* 9.2   PT/INR No results for input(s): "LABPROT", "INR" in the last 72 hours.  ABG No results for input(s): "PHART", "HCO3" in the last 72 hours.  Invalid input(s): "PCO2", "PO2"  Studies/Results:  Anti-infectives: Anti-infectives (From admission, onward)    Start     Dose/Rate Route Frequency Ordered Stop   09/14/21 1400  neomycin (MYCIFRADIN) tablet 1,000 mg  Status:  Discontinued       See Hyperspace for full Linked Orders Report.   1,000 mg Oral 3 times per day 09/14/21 0604 09/14/21 0606   09/14/21 1400  metroNIDAZOLE (FLAGYL) tablet 1,000 mg  Status:  Discontinued       See Hyperspace for full Linked Orders Report.   1,000 mg Oral 3 times per day 09/14/21 0604 09/14/21 0606   09/14/21 0615  cefoTEtan (CEFOTAN) 2 g in sodium chloride 0.9 % 100 mL IVPB        2 g 200 mL/hr over 30 Minutes Intravenous On call to O.R. 09/14/21 0604 09/14/21 0756         Assessment/Plan: Patient Active Problem List   Diagnosis Date Noted   S/P right hemicolectomy 09/14/2021   Colonic mass 09/13/2021   Malignant neoplasm of ascending colon ([place]) 08/06/2021   Acute cholecystitis 02/19/2018   s/p Procedure(s): LAPAROSCOPIC TO OPEN RIGHT HEMI COLECTOMY 09/14/2021  -Postoperative ileus - XR shows air in his transverse and descending colon as well as in small bowel - findings we would expect with an ileus type picture -Sips liquids for now until everything begins to move through again -Ambulate 5x/day -Restart MIVF -Ppx: SQH, SCDs   LOS: 5 days   Nadeen Landau, MD [facility], [place]

## 2021-09-18 NOTE — Plan of Care (Signed)
Plan of care reviewed and discussed.  Ambulation encouraged.

## 2021-09-19 ENCOUNTER — Encounter (HOSPITAL_COMMUNITY): Payer: Self-pay | Admitting: Surgery

## 2021-09-19 DIAGNOSIS — I10 Essential (primary) hypertension: Secondary | ICD-10-CM | POA: Diagnosis present

## 2021-09-19 DIAGNOSIS — E039 Hypothyroidism, unspecified: Secondary | ICD-10-CM | POA: Diagnosis present

## 2021-09-19 DIAGNOSIS — E119 Type 2 diabetes mellitus without complications: Secondary | ICD-10-CM

## 2021-09-19 DIAGNOSIS — D500 Iron deficiency anemia secondary to blood loss (chronic): Secondary | ICD-10-CM | POA: Diagnosis present

## 2021-09-19 DIAGNOSIS — H8110 Benign paroxysmal vertigo, unspecified ear: Secondary | ICD-10-CM | POA: Diagnosis present

## 2021-09-19 DIAGNOSIS — N183 Chronic kidney disease, stage 3 unspecified: Secondary | ICD-10-CM | POA: Diagnosis present

## 2021-09-19 DIAGNOSIS — H35039 Hypertensive retinopathy, unspecified eye: Secondary | ICD-10-CM | POA: Insufficient documentation

## 2021-09-19 DIAGNOSIS — E539 Vitamin B deficiency, unspecified: Secondary | ICD-10-CM | POA: Insufficient documentation

## 2021-09-19 LAB — GLUCOSE, CAPILLARY
Glucose-Capillary: 140 mg/dL — ABNORMAL HIGH (ref 70–99)
Glucose-Capillary: 141 mg/dL — ABNORMAL HIGH (ref 70–99)
Glucose-Capillary: 153 mg/dL — ABNORMAL HIGH (ref 70–99)
Glucose-Capillary: 129 mg/dL — ABNORMAL HIGH (ref 70–99)

## 2021-09-19 LAB — BASIC METABOLIC PANEL
Anion gap: 7 (ref 5–15)
BUN: 25 mg/dL — ABNORMAL HIGH (ref 8–23)
CO2: 24 mmol/L (ref 22–32)
Calcium: 8.9 mg/dL (ref 8.9–10.3)
Chloride: 101 mmol/L (ref 98–111)
Creatinine, Ser: 1.06 mg/dL (ref 0.61–1.24)
GFR, Estimated: >60 mL/min (ref >60)
Glucose, Bld: 161 mg/dL — ABNORMAL HIGH (ref 70–99)
Potassium: 4.3 mmol/L (ref 3.5–5.1)
Sodium: 132 mmol/L — ABNORMAL LOW (ref 135–145)

## 2021-09-19 MED ORDER — INSULIN ASPART 100 UNIT/ML IJ SOLN: 0.0000 [IU] | Freq: Every day | INTRAMUSCULAR | Status: DC

## 2021-09-19 MED ORDER — SODIUM CHLORIDE 0.9% FLUSH
3.0000 mL | Freq: Two times a day (BID) | INTRAVENOUS | Status: DC
  Administered 2021-09-19 – 2021-09-21 (×6): 3 mL via INTRAVENOUS

## 2021-09-19 MED ORDER — CALCIUM POLYCARBOPHIL 625 MG PO TABS
625.0000 mg | ORAL_TABLET | Freq: Two times a day (BID) | ORAL | Status: DC
  Administered 2021-09-20 – 2021-09-22 (×4): 625 mg via ORAL
  Filled 2021-09-19 (×4): qty 1

## 2021-09-19 MED ORDER — INSULIN ASPART 100 UNIT/ML IJ SOLN
0.0000 [IU] | Freq: Three times a day (TID) | INTRAMUSCULAR | Status: DC
  Administered 2021-09-19: 2 [IU] via SUBCUTANEOUS
  Administered 2021-09-19: 3 [IU] via SUBCUTANEOUS
  Administered 2021-09-20 – 2021-09-21 (×4): 2 [IU] via SUBCUTANEOUS

## 2021-09-19 MED ORDER — NAPHAZOLINE-GLYCERIN 0.012-0.25 % OP SOLN: 1.0000 [drp] | Freq: Four times a day (QID) | OPHTHALMIC | Status: DC | PRN

## 2021-09-19 MED ORDER — SODIUM CHLORIDE 0.9 % IV SOLN: 8.0000 mg | Freq: Four times a day (QID) | INTRAVENOUS | Status: DC | PRN

## 2021-09-19 MED ORDER — LIP MEDEX EX OINT
TOPICAL_OINTMENT | Freq: Two times a day (BID) | CUTANEOUS | Status: DC
  Administered 2021-09-19: 75 via TOPICAL
  Administered 2021-09-20 – 2021-09-22 (×2): 1 via TOPICAL
  Filled 2021-09-19 (×3): qty 7

## 2021-09-19 MED ORDER — SALINE SPRAY 0.65 % NA SOLN: 1.0000 | Freq: Four times a day (QID) | NASAL | Status: DC | PRN

## 2021-09-19 MED ORDER — SODIUM CHLORIDE 0.9 % IV SOLN: 250.0000 mL | INTRAVENOUS | Status: DC | PRN

## 2021-09-19 MED ORDER — MAGIC MOUTHWASH: 15.0000 mL | Freq: Four times a day (QID) | ORAL | Status: DC | PRN

## 2021-09-19 MED ORDER — METOPROLOL TARTRATE 5 MG/5ML IV SOLN: 5.0000 mg | Freq: Four times a day (QID) | INTRAVENOUS | Status: DC | PRN

## 2021-09-19 MED ORDER — SODIUM CHLORIDE 0.9% FLUSH: 3.0000 mL | INTRAVENOUS | Status: DC | PRN

## 2021-09-19 MED ORDER — PROCHLORPERAZINE EDISYLATE 10 MG/2ML IJ SOLN
5.0000 mg | INTRAMUSCULAR | Status: DC | PRN
  Administered 2021-09-19: 10 mg via INTRAVENOUS
  Filled 2021-09-19: qty 2

## 2021-09-19 MED ORDER — LACTATED RINGERS IV BOLUS: 1000.0000 mL | Freq: Three times a day (TID) | INTRAVENOUS | Status: AC | PRN

## 2021-09-19 MED ORDER — ONDANSETRON HCL 4 MG/2ML IJ SOLN
4.0000 mg | Freq: Four times a day (QID) | INTRAMUSCULAR | Status: DC | PRN
  Administered 2021-09-19 (×2): 4 mg via INTRAVENOUS
  Filled 2021-09-19 (×2): qty 2

## 2021-09-19 MED ORDER — METHOCARBAMOL 1000 MG/10ML IJ SOLN: 1000.0000 mg | Freq: Four times a day (QID) | INTRAVENOUS | Status: DC | PRN

## 2021-09-19 NOTE — Progress Notes (Addendum)
Joseph Pennington [id number] 09-29-1931  CARE TEAM:  PCP: Shirline Frees, MD  Outpatient Care Team: Patient Care Team: Shirline Frees, MD as PCP - General (Family Medicine)  Inpatient Treatment Team: Treatment Team: Attending Provider: Ileana Roup, MD; Registered Nurse: Dorinda Hill, RN; Utilization Review: Claudie Leach, RN; Registered Nurse: Marlis Edelson, RN; Pharmacist: Randa Spike, [facility]   Problem List:   Principal Problem:   Malignant neoplasm of ascending colon [facility]) Active Problems:   Colonic mass   S/P right hemicolectomy   Chronic kidney disease, stage 3 unspecified ([place])   Benign paroxysmal positional vertigo   Hypertension   Hypothyroidism   Iron deficiency anemia due to chronic blood loss   Type 2 diabetes mellitus without complication ([place])   5 Days Post-Op  09/14/2021  PREOP DIAGNOSIS: Ascending colon cancer   POSTOP DIAGNOSIS: Same   PROCEDURE:  Laparoscopic converted to open right hemicolectomy Repair of small bowel serosa x 2   SURGEON: Sharon Mt. White, MD   FINDINGS:  Large mass in proximal ascending colon. No obvious metastatic disease on visceral parietal peritoneum or liver.    Assessment  Starting to open up  [facility] Stay = 6 days)  Plan:  -Retry liquid diet. -Nausea and pain control -Follow-up on pathology. -Hypothyroidism.  Resume regimen. -Hypertension control.  Chronic kidney disease.  Creatinine seems functional for right now.  nonoliguric.  Wean off of fluids and follow -Some hyperglycemia.  Back off IV fluids.  Sliding scale insulin just in case.  Patient notes he has borderline diabetes usually diet controlled. -VTE prophylaxis- SCDs, etc -mobilize as tolerated to help recovery  Disposition:  Disposition:  The patient is from: Home  Anticipate discharge to:  Home  Anticipated Date of Discharge is:  July 5,2023    Barriers to discharge:  Pending Clinical improvement (more likely  than not)  Patient currently is NOT MEDICALLY STABLE for discharge from the hospital from a surgery standpoint.      I reviewed nursing notes, last 24 h vitals and pain scores, last 48 h intake and output, last 24 h labs and trends, and last 24 h imaging results. I have reviewed this patient's available data, including medical history, events of note, test results, etc as part of my evaluation.  A significant portion of that time was spent in counseling.  Care during the described time interval was provided by me.  This care required moderate level of medical decision making.  09/19/2021    Subjective: (Chief complaint)  Patient feeling better overall.  Pain controlled.  Objective:  Vital signs:  Vitals:   09/18/21 1221 09/18/21 2142 09/19/21 0500 09/19/21 0559  BP: 135/81 124/86  129/80  Pulse: 92 (!) 105  99  Resp: '20 16  20  '$ Temp: 97.6 F (36.4 C) 97.8 F (36.6 C)  98.2 F (36.8 C)  TempSrc: Oral Oral  Oral  SpO2: 99% 100%  100%  Weight:   91 kg   Height:        Last BM Date : 09/16/21  Intake/Output   Yesterday:  06/30 0701 - 07/01 0700 In: 2422.1 [I.V.:1922.1] Out: 200 [Emesis/NG output:200] This shift:  No intake/output data recorded.  Bowel function:  Flatus: YES  BM:  YES  Drain: (No drain)   Physical Exam:  General: Pt awake/alert in no acute distress Eyes: PERRL, normal EOM.  Sclera clear.  No icterus Neuro: CN II-XII intact w/o focal sensory/motor deficits. Lymph: No head/neck/groin lymphadenopathy Psych:  No delerium/psychosis/paranoia.  Oriented x 4 HENT: Normocephalic, Mucus membranes moist.  No thrush Neck: Supple, No tracheal deviation.  No obvious thyromegaly Chest: No pain to chest wall compression.  Good respiratory excursion.  No audible wheezing CV:  Pulses intact.  Regular rhythm.  No major extremity edema MS: Normal AROM mjr joints.  No obvious deformity  Abdomen: Soft.  Mildy distended.  Nontender.  No evidence of  peritonitis.  No incarcerated hernias.  Ext:   No deformity.  No mjr edema.  No cyanosis Skin: No petechiae / purpurea.  No major sores.  Warm and dry    Results:   Cultures: No results found for this or any previous visit (from the past 720 hour(s)).  Labs: Results for orders placed or performed during the hospital encounter of 09/13/21 (from the past 48 hour(s))  CBC with Differential/Platelet     Status: Abnormal   Collection Time: 09/18/21 10:06 AM  Result Value Ref Range   WBC 9.6 4.0 - 10.5 K/uL   RBC 3.41 (L) 4.22 - 5.81 MIL/uL   Hemoglobin 8.7 (L) 13.0 - 17.0 g/dL   HCT 28.4 (L) 39.0 - 52.0 %   MCV 83.3 80.0 - 100.0 fL   MCH 25.5 (L) 26.0 - 34.0 pg   MCHC 30.6 30.0 - 36.0 g/dL   RDW 15.8 (H) 11.5 - 15.5 %   Platelets 406 (H) 150 - 400 K/uL   nRBC 0.0 0.0 - 0.2 %   Neutrophils Relative % 85 %   Neutro Abs 8.2 (H) 1.7 - 7.7 K/uL   Lymphocytes Relative 8 %   Lymphs Abs 0.8 0.7 - 4.0 K/uL   Monocytes Relative 5 %   Monocytes Absolute 0.5 0.1 - 1.0 K/uL   Eosinophils Relative 1 %   Eosinophils Absolute 0.1 0.0 - 0.5 K/uL   Basophils Relative 0 %   Basophils Absolute 0.0 0.0 - 0.1 K/uL   Immature Granulocytes 1 %   Abs Immature Granulocytes 0.05 0.00 - 0.07 K/uL    Comment: Performed at [facility], [place] [address] [place], [place] [address]  Basic metabolic panel     Status: Abnormal   Collection Time: 09/18/21 10:06 AM  Result Value Ref Range   Sodium 133 (L) 135 - 145 mmol/L   Potassium 4.2 3.5 - 5.1 mmol/L   Chloride 102 98 - 111 mmol/L   CO2 23 22 - 32 mmol/L   Glucose, Bld 165 (H) 70 - 99 mg/dL    Comment: Glucose reference range applies only to samples taken after fasting for at least 8 hours.   BUN 22 8 - 23 mg/dL   Creatinine, Ser 1.07 0.61 - 1.24 mg/dL   Calcium 9.1 8.9 - 10.3 mg/dL   GFR, Estimated >60 >60 mL/min    Comment: (NOTE) Calculated using the CKD-EPI Creatinine Equation (2021)    Anion gap 8 5 - 15    Comment:  Performed at [facility], [place] [address], [place], [place] [address]  Basic metabolic panel     Status: Abnormal   Collection Time: 09/19/21  5:17 AM  Result Value Ref Range   Sodium 132 (L) 135 - 145 mmol/L   Potassium 4.3 3.5 - 5.1 mmol/L   Chloride 101 98 - 111 mmol/L   CO2 24 22 - 32 mmol/L   Glucose, Bld 161 (H) 70 - 99 mg/dL    Comment: Glucose reference range applies only to samples taken after fasting for at least 8 hours.  BUN 25 (H) 8 - 23 mg/dL   Creatinine, Ser 1.06 0.61 - 1.24 mg/dL   Calcium 8.9 8.9 - 10.3 mg/dL   GFR, Estimated >60 >60 mL/min    Comment: (NOTE) Calculated using the CKD-EPI Creatinine Equation (2021)    Anion gap 7 5 - 15    Comment: Performed at [facility], [place] [address] [place], [place] [address]    Imaging / Studies: DG Abd Portable 1V  Result Date: 09/18/2021 CLINICAL DATA:  Recent right hemicolectomy, with vomiting. EXAM: PORTABLE ABDOMEN - 1 VIEW COMPARISON:  None Available. FINDINGS: 5:38 a.m. Interval right hemicolectomy. There is mild gaseous distention of the transverse and proximal descending colon. There are dilated loops of small bowel inferior to the transverse colon up to 5.2 cm concerning for a distal small-bowel obstruction possibly at the anastomosis. Dilated bowel is not seen elsewhere. There are old cholecystectomy clips in the right upper abdomen. Visceral shadows are stable. Lung bases appear clear. There is no supine evidence of free air. Osteopenia and degenerative change dorsal spine. IMPRESSION: Interval right hemicolectomy with small bowel dilatation in the central abdomen up to 5.2 cm concerning for distal small bowel obstruction, potentially at the anastomosis. Recommend CT. Electronically Signed   By: Telford Nab M.D.   On: 09/18/2021 07:43    Medications / Allergies: per chart  Antibiotics: Anti-infectives (From admission, onward)    Start     Dose/Rate Route Frequency Ordered  Stop   09/14/21 1400  neomycin (MYCIFRADIN) tablet 1,000 mg  Status:  Discontinued       See Hyperspace for full Linked Orders Report.   1,000 mg Oral 3 times per day 09/14/21 0604 09/14/21 0606   09/14/21 1400  metroNIDAZOLE (FLAGYL) tablet 1,000 mg  Status:  Discontinued       See Hyperspace for full Linked Orders Report.   1,000 mg Oral 3 times per day 09/14/21 0604 09/14/21 0606   09/14/21 0615  cefoTEtan (CEFOTAN) 2 g in sodium chloride 0.9 % 100 mL IVPB        2 g 200 mL/hr over 30 Minutes Intravenous On call to O.R. 09/14/21 0604 09/14/21 0756         Note: Portions of this report may have been transcribed using voice recognition software. Every effort was made to ensure accuracy; however, inadvertent computerized transcription errors may be present.   Any transcriptional errors that result from this process are unintentional.    Adin Hector, MD, FACS, MASCRS Esophageal, Gastrointestinal & Colorectal Surgery Robotic and Minimally Invasive Surgery  [facility], [place]  [place], [place], [place] [address] [phone] Fax [phone] Main  CONTACT INFORMATION:  Weekday (9AM-5PM): Call [facility] main office at [phone]  Weeknight (5PM-9AM) or Weekend/Holiday: Check [email or web address] (password " [email or web address]") for General Surgery [facility] coverage  (Please, do not use SecureChat as it is not reliable communication to operating surgeons for immediate patient care)      09/19/2021  9:49 AM

## 2021-09-19 NOTE — Progress Notes (Signed)
Patient has had frequent liquid stools today This morning he passed some gas, now he is burping without passing gas. He has some urgency when he feel he has to go. He did express he was nauseated Zofran given at 1403.

## 2021-09-19 NOTE — Progress Notes (Signed)
Pt reports he has passed a small amount of gas, he also had a liquid BM. He feels "rumbling" in his abdomen, bowel sounds are active. He is still having nausea but has not vomited. Zofran has been effective in decreasing the nausea to some degree. Continue to monitor. Hortencia Conradi RN

## 2021-09-19 NOTE — Evaluation (Signed)
Occupational Therapy Evaluation Patient Details Name: Joseph Pennington MRN: [id number] DOB: 1931/04/18 Today's Date: 09/19/2021   History of Present Illness Joseph Pennington is a 88 y.o. male with history of HTN, CKD, DM (last A1c off tx 5.8), iron deficiency anemia, CAD, hypothyroidism, GERD, newly diagnosed ascending/cecal colon cancer and now s/p LAPAROSCOPIC TO OPEN RIGHT HEMI COLECTOMY on 09/14/2021.   Clinical Impression   Mr. Joseph Pennington is an 88 year old man who presents s/p abdominal surgery. Patient in bathroom when therapist entered the room. Patient able to perform toileting, twice, without assistance. Patient able to ambulate in room without a device or without overt loss of balance. Patient demonstrates ability to perform all ADLs and use compensatory positioning  if needed. Patient has family assistance at home for IADLs. Patient has no OT needs at this time.     Recommendations for follow up therapy are one component of a multi-disciplinary discharge planning process, led by the attending physician.  Recommendations may be updated based on patient status, additional functional criteria and insurance authorization.   Follow Up Recommendations  No OT follow up    Assistance Recommended at Discharge PRN  Patient can return home with the following Assistance with cooking/housework    Functional Status Assessment  Patient has not had a recent decline in their functional status  Equipment Recommendations  None recommended by OT    Recommendations for Other Services       Precautions / Restrictions Precautions Precautions: Other (comment) Precaution Comments: abdominal incision Restrictions Weight Bearing Restrictions: No      Mobility Bed Mobility Overal bed mobility: Independent             General bed mobility comments: Independent in room in and out of bed    Transfers Overall transfer level: Independent                 General transfer comment:  independent with in room ambulation.      Balance Overall balance assessment: No apparent balance deficits (not formally assessed)                                         ADL either performed or assessed with clinical judgement   ADL Overall ADL's : Modified independent                                       General ADL Comments: Increased time, use of compensatory body positions but otherwise independent.     Vision Patient Visual Report: No change from baseline       Perception     Praxis      Pertinent Vitals/Pain Pain Assessment Pain Assessment: No/denies pain     Hand Dominance Right   Extremity/Trunk Assessment Upper Extremity Assessment Upper Extremity Assessment: Overall WFL for tasks assessed   Lower Extremity Assessment Lower Extremity Assessment: Overall WFL for tasks assessed   Cervical / Trunk Assessment Cervical / Trunk Assessment: Normal   Communication Communication Communication: No difficulties   Cognition Arousal/Alertness: Awake/alert Behavior During Therapy: WFL for tasks assessed/performed Overall Cognitive Status: Within Functional Limits for tasks assessed  General Comments       Exercises     Shoulder Instructions      Home Living Family/patient expects to be discharged to:: Private residence Living Arrangements: Spouse/significant other Available Help at Discharge: Family;Available 24 hours/day Type of Home: House Home Access: Stairs to enter [employer] of Steps: 6 Entrance Stairs-Rails: Right;Left Home Layout: Able to live on main level with bedroom/bathroom;Two level Alternate Level Stairs-Number of Steps: has a Financial controller Shower/Tub: Walk-in shower         Home Equipment: Animator (2 wheels);Kasandra Knudsen - single point   Additional Comments: son and daughter help out as well. Son is currently  watching his wife.      Prior Functioning/Environment Prior Level of Function : Independent/Modified Independent                        OT Problem List: Pain      OT Treatment/Interventions:      OT Goals(Current goals can be found in the care plan section) Acute Rehab OT Goals OT Goal Formulation: All assessment and education complete, DC therapy  OT Frequency:      Co-evaluation              AM-PAC OT "6 Clicks" Daily Activity     Outcome Measure Help from another person eating meals?: None Help from another person taking care of personal grooming?: None Help from another person toileting, which includes using toliet, bedpan, or urinal?: None Help from another person bathing (including washing, rinsing, drying)?: None Help from another person to put on and taking off regular upper body clothing?: None Help from another person to put on and taking off regular lower body clothing?: None 6 Click Score: 24   End of Session Nurse Communication: Mobility status  Activity Tolerance: Patient tolerated treatment well Patient left: in bed;with call bell/phone within reach  OT Visit Diagnosis: Pain                Time: 1500-1516 OT Time Calculation (min): 16 min Charges:  OT General Charges $OT Visit: 1 Visit OT Evaluation $OT Eval Low Complexity: 1 Low  Kevyn Wengert, OTR/L [place]  Office [phone] Pager: [phone]   Lenward Chancellor 09/19/2021, 3:27 PM

## 2021-09-20 LAB — HEMOGLOBIN A1C
Hgb A1c MFr Bld: 6.5 % — ABNORMAL HIGH (ref 4.8–5.6)
Mean Plasma Glucose: 139.85 mg/dL

## 2021-09-20 LAB — CBC
HCT: 25.3 % — ABNORMAL LOW (ref 39.0–52.0)
Hemoglobin: 7.8 g/dL — ABNORMAL LOW (ref 13.0–17.0)
MCH: 25.8 pg — ABNORMAL LOW (ref 26.0–34.0)
MCHC: 30.8 g/dL (ref 30.0–36.0)
MCV: 83.8 fL (ref 80.0–100.0)
Platelets: 373 10*3/uL (ref 150–400)
RBC: 3.02 MIL/uL — ABNORMAL LOW (ref 4.22–5.81)
RDW: 15.9 % — ABNORMAL HIGH (ref 11.5–15.5)
WBC: 7.9 10*3/uL (ref 4.0–10.5)
nRBC: 0 % (ref 0.0–0.2)

## 2021-09-20 LAB — CREATININE, SERUM
Creatinine, Ser: 1.1 mg/dL (ref 0.61–1.24)
GFR, Estimated: >60 mL/min (ref >60)

## 2021-09-20 LAB — GLUCOSE, CAPILLARY
Glucose-Capillary: 130 mg/dL — ABNORMAL HIGH (ref 70–99)
Glucose-Capillary: 117 mg/dL — ABNORMAL HIGH (ref 70–99)
Glucose-Capillary: 133 mg/dL — ABNORMAL HIGH (ref 70–99)
Glucose-Capillary: 115 mg/dL — ABNORMAL HIGH (ref 70–99)

## 2021-09-20 LAB — MAGNESIUM: Magnesium: 2 mg/dL (ref 1.7–2.4)

## 2021-09-20 LAB — POTASSIUM: Potassium: 4.2 mmol/L (ref 3.5–5.1)

## 2021-09-20 MED ORDER — HYDROMORPHONE HCL 1 MG/ML IJ SOLN: 0.5000 mg | INTRAMUSCULAR | Status: DC | PRN

## 2021-09-20 MED ORDER — TRAMADOL HCL 50 MG PO TABS: 50.0000 mg | ORAL_TABLET | Freq: Four times a day (QID) | ORAL | Status: DC | PRN

## 2021-09-20 MED ORDER — SODIUM CHLORIDE 0.9 % IV SOLN
125.0000 mg | Freq: Once | INTRAVENOUS | Status: AC
  Administered 2021-09-20: 125 mg via INTRAVENOUS
  Filled 2021-09-20: qty 10

## 2021-09-20 MED ORDER — PANTOPRAZOLE SODIUM 40 MG PO TBEC
40.0000 mg | DELAYED_RELEASE_TABLET | Freq: Every day | ORAL | Status: DC
  Administered 2021-09-20 – 2021-09-21 (×2): 40 mg via ORAL
  Filled 2021-09-20 (×2): qty 1

## 2021-09-20 NOTE — Evaluation (Signed)
Physical Therapy Evaluation Patient Details Name: Joseph Pennington MRN: [id number] DOB: 07-03-1931 Today's Date: 09/20/2021  History of Present Illness  Joseph Pennington is a 88 y.o. male with history of HTN, CKD, DM (last A1c off tx 5.8), iron deficiency anemia, CAD, hypothyroidism, GERD, newly diagnosed ascending/cecal colon cancer and now s/p LAPAROSCOPIC TO OPEN RIGHT HEMI COLECTOMY on 09/14/2021.  Clinical Impression  Pt admitted as above and presenting with functional mobility limitations 2* mild ambulatory balance deficits and intermittent post op pain (if I move the wrong way I feel it).  Pt very motivated and should progress to dc home with family assist .     Recommendations for follow up therapy are one component of a multi-disciplinary discharge planning process, led by the attending physician.  Recommendations may be updated based on patient status, additional functional criteria and insurance authorization.  Follow Up Recommendations No PT follow up      Assistance Recommended at Discharge Intermittent Supervision/Assistance  Patient can return home with the following  A little help with walking and/or transfers;A little help with bathing/dressing/bathroom;Assistance with cooking/housework;Assist for transportation;Help with stairs or ramp for entrance    Equipment Recommendations None recommended by PT  Recommendations for Other Services       Functional Status Assessment Patient has had a recent decline in their functional status and demonstrates the ability to make significant improvements in function in a reasonable and predictable amount of time.     Precautions / Restrictions Precautions Precautions: Other (comment) Precaution Comments: abdominal incision Restrictions Weight Bearing Restrictions: No      Mobility  Bed Mobility               General bed mobility comments: Pt up in recliner and requests back to same    Transfers Overall transfer level:  Modified independent                      Ambulation/Gait Ambulation/Gait assistance: Min assist, Min guard Gait Distance (Feet): 400 Feet Assistive device: Rolling walker (2 wheels), 1 person hand held assist Gait Pattern/deviations: Step-through pattern, Decreased step length - right, Decreased step length - left, Shuffle       General Gait Details: Pt ambulated 200' with RW and 200' with HHA but with noted decreased stability.  No over LOB.  Stairs            Wheelchair Mobility    Modified Rankin (Stroke Patients Only)       Balance Overall balance assessment: Mild deficits observed, not formally tested                                           Pertinent Vitals/Pain Pain Assessment Pain Assessment: No/denies pain    Home Living Family/patient expects to be discharged to:: Private residence Living Arrangements: Spouse/significant other Available Help at Discharge: Family;Available 24 hours/day Type of Home: House Home Access: Stairs to enter Entrance Stairs-Rails: Right;Left Entrance Stairs-Number of Steps: 6 Alternate Level Stairs-Number of Steps: has a chair lift Home Layout: Able to live on main level with bedroom/bathroom;Two level Home Equipment: Animator (2 wheels);Kasandra Knudsen - single point Additional Comments: son and daughter help out as well. Son is currently watching his wife.    Prior Function Prior Level of Function : Independent/Modified Independent  Hand Dominance   Dominant Hand: Right    Extremity/Trunk Assessment   Upper Extremity Assessment Upper Extremity Assessment: Overall WFL for tasks assessed    Lower Extremity Assessment Lower Extremity Assessment: Overall WFL for tasks assessed    Cervical / Trunk Assessment Cervical / Trunk Assessment: Normal  Communication   Communication: No difficulties  Cognition Arousal/Alertness: Awake/alert Behavior During  Therapy: WFL for tasks assessed/performed Overall Cognitive Status: Within Functional Limits for tasks assessed                                          General Comments      Exercises     Assessment/Plan    PT Assessment Patient needs continued PT services  PT Problem List Decreased activity tolerance;Decreased balance;Decreased mobility       PT Treatment Interventions DME instruction;Gait training;Stair training;Functional mobility training;Therapeutic activities;Therapeutic exercise;Patient/family education;Balance training    PT Goals (Current goals can be found in the Care Plan section)  Acute Rehab PT Goals Patient Stated Goal: Regain IND PT Goal Formulation: With patient Time For Goal Achievement: 10/04/21 Potential to Achieve Goals: Good    Frequency Min 3X/week     Co-evaluation               AM-PAC PT "6 Clicks" Mobility  Outcome Measure Help needed turning from your back to your side while in a flat bed without using bedrails?: None Help needed moving from lying on your back to sitting on the side of a flat bed without using bedrails?: None Help needed moving to and from a bed to a chair (including a wheelchair)?: A Little Help needed standing up from a chair using your arms (e.g., wheelchair or bedside chair)?: A Little Help needed to walk in hospital room?: A Little Help needed climbing 3-5 steps with a railing? : A Little 6 Click Score: 20    End of Session Equipment Utilized During Treatment: Gait belt Activity Tolerance: Patient tolerated treatment well Patient left: in chair;with call bell/phone within reach Nurse Communication: Mobility status PT Visit Diagnosis: Unsteadiness on feet (R26.81)    Time: 1203-1218 PT Time Calculation (min) (ACUTE ONLY): 15 min   Charges:   PT Evaluation $PT Eval Low Complexity: 1 Low          [place] Pager [phone] Office  [phone]   Almee Pelphrey 09/20/2021, 12:45 PM

## 2021-09-20 NOTE — Progress Notes (Signed)
Joseph Pennington [id number] January 30, 1932  CARE TEAM:  PCP: Shirline Frees, MD  Outpatient Care Team: Patient Care Team: Shirline Frees, MD as PCP - General (Family Medicine)  Inpatient Treatment Team: Treatment Team: Attending Provider: Ileana Roup, MD; Registered Nurse: Dorinda Hill, RN; Physical Therapist: Mathis Fare, PT; Technician: Wright, [place] E, NT; Utilization Review: Claudie Leach, RN; Registered Nurse: Marlis Edelson, RN; Social Worker: Illene Regulus, [place]   Problem List:   Principal Problem:   Ascending colon cancer s/p lap colectomy 09/14/2021 Active Problems:   S/P right hemicolectomy   Chronic kidney disease, stage 3 unspecified ([place])   Benign paroxysmal positional vertigo   Hypertension   Hypothyroidism   Iron deficiency anemia due to chronic blood loss   Type 2 diabetes mellitus without complication ([place])   6 Days Post-Op  09/14/2021  PREOP DIAGNOSIS: Ascending colon cancer   POSTOP DIAGNOSIS: Same   PROCEDURE:  Laparoscopic converted to open right hemicolectomy Repair of small bowel serosa x 2   SURGEON: Sharon Mt. White, MD   FINDINGS:  Large mass in proximal ascending colon. No obvious metastatic disease on visceral parietal peritoneum or liver.    Assessment  Starting to open up  [facility] Stay = 7 days)  Plan:  -Patient had retching yesterday but feels much better today after numerous bowel movements.  We will advance to dysphagia 1/full liquid diet for now.  Try not to advance beyond that. -Nausea and pain control -Follow-up on pathology. -Hypothyroidism.  Resume regimen. -Hypertension control. -Anemia.  Most likely some blood loss in the setting of iron deficiency anemia from chronic loss.  Give dose of IV iron.  Hold off on oral iron since it may have made him feel little nauseated.  Chronic kidney disease.  Creatinine seems functional for right now.  nonoliguric.  Wean off of fluids and  follow.  Backup as needed but try and keep on the dry side if possible.  -Some hyperglycemia.  Seems rather mild but not normal.  Sliding scale insulin just in case.  Patient notes he has borderline diabetes usually diet controlled.  -VTE prophylaxis- SCDs, etc -mobilize as tolerated to help recovery  Disposition:  Disposition:  The patient is from: Home  Anticipate discharge to:  Home  Anticipated Date of Discharge is:  July 5,2023    Barriers to discharge:  Pending Clinical improvement (more likely than not)  Patient currently is NOT MEDICALLY STABLE for discharge from the hospital from a surgery standpoint.      I reviewed nursing notes, last 24 h vitals and pain scores, last 48 h intake and output, last 24 h labs and trends, and last 24 h imaging results. I have reviewed this patient's available data, including medical history, events of note, test results, etc as part of my evaluation.  A significant portion of that time was spent in counseling.  Care during the described time interval was provided by me.  This care required moderate level of medical decision making.  09/20/2021    Subjective: (Chief complaint)  Patient feeling better overall. Had a lot of loose bowel movements which helped him feel more cleaned out. Tolerating liquids for most part but did have a retching episode yesterday. Wanted to get up more but cautioned to not try and mobilize by himself.   Daughter in room. Pain controlled.  Objective:  Vital signs:  Vitals:   09/19/21 1135 09/19/21 2200 09/20/21 0500 09/20/21 0623  BP: 128/79 126/75  127/68  Pulse: 85 84  94  Resp: '20 18  18  '$ Temp: (!) 97.5 F (36.4 C) 98.6 F (37 C)  97.8 F (36.6 C)  TempSrc: Oral Oral  Oral  SpO2: 97% 99%  98%  Weight:   92.7 kg   Height:        Last BM Date : 09/19/21  Intake/Output   Yesterday:  07/01 0701 - 07/02 0700 In: 721.2 [P.O.:340; I.V.:381.2] Out: 200 [Emesis/NG output:200] This shift:  No  intake/output data recorded.  Bowel function:  Flatus: YES  BM:  YES  Drain: (No drain)   Physical Exam:  General: Pt awake/alert in no acute distress Eyes: PERRL, normal EOM.  Sclera clear.  No icterus Neuro: CN II-XII intact w/o focal sensory/motor deficits. Lymph: No head/neck/groin lymphadenopathy Psych:  No delerium/psychosis/paranoia.  Oriented x 4 HENT: Normocephalic, Mucus membranes moist.  No thrush Neck: Supple, No tracheal deviation.  No obvious thyromegaly Chest: No pain to chest wall compression.  Good respiratory excursion.  No audible wheezing CV:  Pulses intact.  Regular rhythm.  No major extremity edema MS: Normal AROM mjr joints.  No obvious deformity  Abdomen: Soft.  Mildy distended.  Nontender.  Incisions clean dry and intact.  No drainage or ecchymosis.  No evidence of peritonitis.  No incarcerated hernias.  Ext:   No deformity.  No mjr edema.  No cyanosis Skin: No petechiae / purpurea.  No major sores.  Warm and dry    Results:   Cultures: No results found for this or any previous visit (from the past 720 hour(s)).  Labs: Results for orders placed or performed during the hospital encounter of 09/13/21 (from the past 48 hour(s))  CBC with Differential/Platelet     Status: Abnormal   Collection Time: 09/18/21 10:06 AM  Result Value Ref Range   WBC 9.6 4.0 - 10.5 K/uL   RBC 3.41 (L) 4.22 - 5.81 MIL/uL   Hemoglobin 8.7 (L) 13.0 - 17.0 g/dL   HCT 28.4 (L) 39.0 - 52.0 %   MCV 83.3 80.0 - 100.0 fL   MCH 25.5 (L) 26.0 - 34.0 pg   MCHC 30.6 30.0 - 36.0 g/dL   RDW 15.8 (H) 11.5 - 15.5 %   Platelets 406 (H) 150 - 400 K/uL   nRBC 0.0 0.0 - 0.2 %   Neutrophils Relative % 85 %   Neutro Abs 8.2 (H) 1.7 - 7.7 K/uL   Lymphocytes Relative 8 %   Lymphs Abs 0.8 0.7 - 4.0 K/uL   Monocytes Relative 5 %   Monocytes Absolute 0.5 0.1 - 1.0 K/uL   Eosinophils Relative 1 %   Eosinophils Absolute 0.1 0.0 - 0.5 K/uL   Basophils Relative 0 %   Basophils Absolute 0.0  0.0 - 0.1 K/uL   Immature Granulocytes 1 %   Abs Immature Granulocytes 0.05 0.00 - 0.07 K/uL    Comment: Performed at [facility], [place] [address], [place], [place] [address]  Basic metabolic panel     Status: Abnormal   Collection Time: 09/18/21 10:06 AM  Result Value Ref Range   Sodium 133 (L) 135 - 145 mmol/L   Potassium 4.2 3.5 - 5.1 mmol/L   Chloride 102 98 - 111 mmol/L   CO2 23 22 - 32 mmol/L   Glucose, Bld 165 (H) 70 - 99 mg/dL    Comment: Glucose reference range applies only to samples taken after fasting for at least 8 hours.   BUN 22 8 - 23  mg/dL   Creatinine, Ser 1.07 0.61 - 1.24 mg/dL   Calcium 9.1 8.9 - 10.3 mg/dL   GFR, Estimated >60 >60 mL/min    Comment: (NOTE) Calculated using the CKD-EPI Creatinine Equation (2021)    Anion gap 8 5 - 15    Comment: Performed at [facility], [place] [address] [place], [place] [address]  Basic metabolic panel     Status: Abnormal   Collection Time: 09/19/21  5:17 AM  Result Value Ref Range   Sodium 132 (L) 135 - 145 mmol/L   Potassium 4.3 3.5 - 5.1 mmol/L   Chloride 101 98 - 111 mmol/L   CO2 24 22 - 32 mmol/L   Glucose, Bld 161 (H) 70 - 99 mg/dL    Comment: Glucose reference range applies only to samples taken after fasting for at least 8 hours.   BUN 25 (H) 8 - 23 mg/dL   Creatinine, Ser 1.06 0.61 - 1.24 mg/dL   Calcium 8.9 8.9 - 10.3 mg/dL   GFR, Estimated >60 >60 mL/min    Comment: (NOTE) Calculated using the CKD-EPI Creatinine Equation (2021)    Anion gap 7 5 - 15    Comment: Performed at [facility], [place] [address], [place], [place] [address]  Glucose, capillary     Status: Abnormal   Collection Time: 09/19/21 11:36 AM  Result Value Ref Range   Glucose-Capillary 140 (H) 70 - 99 mg/dL    Comment: Glucose reference range applies only to samples taken after fasting for at least 8 hours.  Glucose, capillary     Status: Abnormal   Collection Time: 09/19/21  1:10 PM   Result Value Ref Range   Glucose-Capillary 141 (H) 70 - 99 mg/dL    Comment: Glucose reference range applies only to samples taken after fasting for at least 8 hours.  Glucose, capillary     Status: Abnormal   Collection Time: 09/19/21  5:13 PM  Result Value Ref Range   Glucose-Capillary 153 (H) 70 - 99 mg/dL    Comment: Glucose reference range applies only to samples taken after fasting for at least 8 hours.  Glucose, capillary     Status: Abnormal   Collection Time: 09/19/21 10:27 PM  Result Value Ref Range   Glucose-Capillary 129 (H) 70 - 99 mg/dL    Comment: Glucose reference range applies only to samples taken after fasting for at least 8 hours.  CBC     Status: Abnormal   Collection Time: 09/20/21  5:10 AM  Result Value Ref Range   WBC 7.9 4.0 - 10.5 K/uL   RBC 3.02 (L) 4.22 - 5.81 MIL/uL   Hemoglobin 7.8 (L) 13.0 - 17.0 g/dL   HCT 25.3 (L) 39.0 - 52.0 %   MCV 83.8 80.0 - 100.0 fL   MCH 25.8 (L) 26.0 - 34.0 pg   MCHC 30.8 30.0 - 36.0 g/dL   RDW 15.9 (H) 11.5 - 15.5 %   Platelets 373 150 - 400 K/uL   nRBC 0.0 0.0 - 0.2 %    Comment: Performed at [facility], [place] [address], [place], [place] [address]  Magnesium     Status: None   Collection Time: 09/20/21  5:10 AM  Result Value Ref Range   Magnesium 2.0 1.7 - 2.4 mg/dL    Comment: Performed at [facility], [place] [address] [place], [place] [address]  Potassium     Status: None   Collection Time: 09/20/21  5:10 AM  Result Value Ref Range   Potassium 4.2 3.5 - 5.1 mmol/L    Comment: Performed at [facility], [place] [address], [place], [place] [address]  Creatinine, serum     Status: None   Collection Time: 09/20/21  5:10 AM  Result Value Ref Range   Creatinine, Ser 1.10 0.61 - 1.24 mg/dL   GFR, Estimated >60 >60 mL/min    Comment: (NOTE) Calculated using the CKD-EPI Creatinine Equation (2021) Performed at [facility], [place] [address], [place], [place] [address]   Glucose, capillary     Status: Abnormal   Collection Time: 09/20/21  7:53 AM  Result Value Ref Range   Glucose-Capillary 130 (H) 70 - 99 mg/dL    Comment: Glucose reference range applies only to samples taken after fasting for at least 8 hours.    Imaging / Studies: No results found.  Medications / Allergies: per chart  Antibiotics: Anti-infectives (From admission, onward)    Start     Dose/Rate Route Frequency Ordered Stop   09/14/21 1400  neomycin (MYCIFRADIN) tablet 1,000 mg  Status:  Discontinued       See Hyperspace for full Linked Orders Report.   1,000 mg Oral 3 times per day 09/14/21 0604 09/14/21 0606   09/14/21 1400  metroNIDAZOLE (FLAGYL) tablet 1,000 mg  Status:  Discontinued       See Hyperspace for full Linked Orders Report.   1,000 mg Oral 3 times per day 09/14/21 0604 09/14/21 0606   09/14/21 0615  cefoTEtan (CEFOTAN) 2 g in sodium chloride 0.9 % 100 mL IVPB        2 g 200 mL/hr over 30 Minutes Intravenous On call to O.R. 09/14/21 0604 09/14/21 0756         Note: Portions of this report may have been transcribed using voice recognition software. Every effort was made to ensure accuracy; however, inadvertent computerized transcription errors may be present.   Any transcriptional errors that result from this process are unintentional.    Adin Hector, MD, FACS, MASCRS Esophageal, Gastrointestinal & Colorectal Surgery Robotic and Minimally Invasive Surgery  [facility], [place]  [place]. [address], [place], [place] [address] [phone] Fax [phone] Main  CONTACT INFORMATION:  Weekday (9AM-5PM): Call [facility] main office at [phone]  Weeknight (5PM-9AM) or Weekend/Holiday: Check [email or web address] (password " [email or web address]") for General Surgery [facility] coverage  (Please, do not use SecureChat as it is not reliable communication to operating surgeons for immediate  patient care)      09/20/2021  9:31 AM

## 2021-09-21 LAB — CREATININE, SERUM
Creatinine, Ser: 0.96 mg/dL (ref 0.61–1.24)
GFR, Estimated: >60 mL/min (ref >60)

## 2021-09-21 LAB — GLUCOSE, CAPILLARY
Glucose-Capillary: 110 mg/dL — ABNORMAL HIGH (ref 70–99)
Glucose-Capillary: 123 mg/dL — ABNORMAL HIGH (ref 70–99)
Glucose-Capillary: 129 mg/dL — ABNORMAL HIGH (ref 70–99)
Glucose-Capillary: 125 mg/dL — ABNORMAL HIGH (ref 70–99)

## 2021-09-21 LAB — CBC
HCT: 22.9 % — ABNORMAL LOW (ref 39.0–52.0)
Hemoglobin: 7 g/dL — ABNORMAL LOW (ref 13.0–17.0)
MCH: 25.7 pg — ABNORMAL LOW (ref 26.0–34.0)
MCHC: 30.6 g/dL (ref 30.0–36.0)
MCV: 84.2 fL (ref 80.0–100.0)
Platelets: 324 10*3/uL (ref 150–400)
RBC: 2.72 MIL/uL — ABNORMAL LOW (ref 4.22–5.81)
RDW: 15.9 % — ABNORMAL HIGH (ref 11.5–15.5)
WBC: 5.1 10*3/uL (ref 4.0–10.5)
nRBC: 0 % (ref 0.0–0.2)

## 2021-09-21 LAB — POTASSIUM: Potassium: 3.9 mmol/L (ref 3.5–5.1)

## 2021-09-21 LAB — PREPARE RBC (CROSSMATCH)

## 2021-09-21 MED ORDER — SODIUM CHLORIDE 0.9% IV SOLUTION: Freq: Once | INTRAVENOUS | Status: DC

## 2021-09-21 NOTE — Plan of Care (Signed)

## 2021-09-21 NOTE — Care Management Important Message (Signed)
Important Message  Patient Details IM Letter given to the Patient. Name: Joseph Pennington MRN: [id number] Date of Birth: 04/06/1931   Medicare Important Message Given:  Yes     Kerin Salen 09/21/2021, 3:34 PM

## 2021-09-21 NOTE — Progress Notes (Signed)
Pt has a Blood transfusion order placed at 1254. Still need a type and screen order per 1st shift RN and she will place the order. Phone consent obtained from pt's son and wife with a 2nd Rn as witness.

## 2021-09-21 NOTE — Progress Notes (Signed)
  Subjective No acute events. Feeling much better. Tolerating diet without n/v. Having flatus and Bms, distention has resolved. Asking when he can get back to riding his tractor  Objective: Vital signs in last 24 hours: Temp:  [97.4 F (36.3 C)-98.1 F (36.7 C)] 97.4 F (36.3 C) (07/03 1253) Pulse Rate:  [74-97] 83 (07/03 1253) Resp:  [16-18] 16 (07/03 1253) BP: (119-126)/(63-75) 126/75 (07/03 1253) SpO2:  [98 %-100 %] 100 % (07/03 1253) Weight:  [91.4 kg] 91.4 kg (07/03 0500) Last BM Date : 09/20/21  Intake/Output from previous day: 07/02 0701 - 07/03 0700 In: 1110 [P.O.:1000; IV Piggyback:110] Out: 0  Intake/Output this shift: Total I/O In: 120 [P.O.:120] Out: -   Gen: NAD, comfortable CV: RRR Pulm: Normal work of breathing Abd: Soft, NT/ND. Incisions c/d/I without erythema or drainage Ext: SCDs in place  Lab Results: CBC  Recent Labs    09/20/21 0510 09/21/21 0428  WBC 7.9 5.1  HGB 7.8* 7.0*  HCT 25.3* 22.9*  PLT 373 324   BMET Recent Labs    09/19/21 0517 09/20/21 0510 09/21/21 0428  NA 132*  --   --   K 4.3 4.2 3.9  CL 101  --   --   CO2 24  --   --   GLUCOSE 161*  --   --   BUN 25*  --   --   CREATININE 1.06 1.10 0.96  CALCIUM 8.9  --   --    PT/INR No results for input(s): "LABPROT", "INR" in the last 72 hours. ABG No results for input(s): "PHART", "HCO3" in the last 72 hours.  Invalid input(s): "PCO2", "PO2"  Studies/Results:  Anti-infectives: Anti-infectives (From admission, onward)    Start     Dose/Rate Route Frequency Ordered Stop   09/14/21 1400  neomycin (MYCIFRADIN) tablet 1,000 mg  Status:  Discontinued       See Hyperspace for full Linked Orders Report.   1,000 mg Oral 3 times per day 09/14/21 0604 09/14/21 0606   09/14/21 1400  metroNIDAZOLE (FLAGYL) tablet 1,000 mg  Status:  Discontinued       See Hyperspace for full Linked Orders Report.   1,000 mg Oral 3 times per day 09/14/21 0604 09/14/21 0606   09/14/21 0615   cefoTEtan (CEFOTAN) 2 g in sodium chloride 0.9 % 100 mL IVPB        2 g 200 mL/hr over 30 Minutes Intravenous On call to O.R. 09/14/21 0604 09/14/21 0756        Assessment/Plan: Patient Active Problem List   Diagnosis Date Noted   Chronic kidney disease, stage 3 unspecified ([place]) 09/19/2021   Benign paroxysmal positional vertigo 09/19/2021   Hypertension 09/19/2021   Hypertensive retinopathy 09/19/2021   Hypothyroidism 09/19/2021   Iron deficiency anemia due to chronic blood loss 09/19/2021   Type 2 diabetes mellitus without complication ([place]) 53/97/6734   Vitamin B deficiency 09/19/2021   S/P right hemicolectomy 09/14/2021   Ascending colon cancer s/p lap colectomy 09/14/2021 08/06/2021   s/p Procedure(s): LAPAROSCOPIC TO OPEN RIGHT HEMI COLECTOMY 09/14/2021  -Postop ileus appears to have resolved; doing great at this point -Hgb equilibrating - 7.0 today; given his advanced age and risks for fall, will transfuse 1U PRBC today... provided he is doing well and h&h checks out, anticipate he will be able to go home tomorrow -Ppx: SQH, SCDs   LOS: 8 days   Nadeen Landau, MD [facility], [place]

## 2021-09-22 LAB — HEMOGLOBIN AND HEMATOCRIT, BLOOD
HCT: 30.8 % — ABNORMAL LOW (ref 39.0–52.0)
Hemoglobin: 9.7 g/dL — ABNORMAL LOW (ref 13.0–17.0)

## 2021-09-22 LAB — GLUCOSE, CAPILLARY: Glucose-Capillary: 103 mg/dL — ABNORMAL HIGH (ref 70–99)

## 2021-09-22 MED ORDER — TRAMADOL HCL 50 MG PO TABS
50.0000 mg | ORAL_TABLET | Freq: Four times a day (QID) | ORAL | 0 refills | Status: DC | PRN
Start: 2021-09-22 — End: 2021-10-15

## 2021-09-22 NOTE — Progress Notes (Signed)
Pt was discharged home today. Instructions were reviewed with patient, and questions were answered. Pt was taken to main entrance via wheelchair by NT.  

## 2021-09-22 NOTE — Discharge Summary (Signed)
Physician Discharge Summary    Patient ID: Joseph Pennington MRN: [id number] DOB/AGE: Feb 18, 1932  88 y.o.  Patient Care Team: Shirline Frees, MD as PCP - General (Family Medicine)  Admit date: 09/13/2021  Discharge date: 09/22/2021  Hospital Stay = 9 days    Discharge Diagnoses:  Principal Problem:   Ascending colon cancer s/p lap colectomy 09/14/2021 Active Problems:   S/P right hemicolectomy   Chronic kidney disease, stage 3 unspecified (HCC)   Benign paroxysmal positional vertigo   Hypertension   Hypothyroidism   Iron deficiency anemia due to chronic blood loss   Type 2 diabetes mellitus without complication ([place])   8 Days Post-Op  09/14/2021  PREOP DIAGNOSIS: Ascending colon cancer   POSTOP DIAGNOSIS: Same   PROCEDURE:  Laparoscopic converted to open right hemicolectomy Repair of small bowel serosa x 2   SURGEON: Sharon Mt. [place], MD   FINDINGS:  Large mass in proximal ascending colon. No obvious metastatic disease on visceral parietal peritoneum or liver.   PATHOLOGY  Pathologic Stage Classification (pTNM, AJCC 8th Edition): pT3 pN1c   SURGICAL PATHOLOGY  * THIS IS AN ADDENDUM REPORT *  CASE: [id number]  PATIENT: Joseph Pennington  Surgical Pathology Report  *Addendum *   Reason for Addendum #1:  DNA Mismatch Repair IHC Results   Clinical History: Colon cancer ([facility])      FINAL MICROSCOPIC DIAGNOSIS:   A. COLON, RIGHT, HEMI COLECTOMY:  Poorly differentiated adenocarcinoma in the ascending colon with clear  margins of resection.  Please see the following synoptic report.   COLON AND RECTUM, CARCINOMA:  Resection, Including Transanal Disk  Excision of Rectal Neoplasms   Procedure: Right hemicolectomy.  Tumor Site: Ascending colon.  Tumor Size: Circumferential measuring 9.0 cm X 8.0 cm. X 2.5 cm.  Macroscopic Tumor Perforation: Not identified  Histologic Type: Adenocarcinoma.  Histologic Grade: Grade 3, poorly differentiated.  Multiple  Primary Sites: Not applicable  Tumor Extension: Extends through the muscular wall into the pericolonic  adipose tissue.  Lymphovascular Invasion: Not identified.  Perineural Invasion: Not identified.  Treatment Effect: No known presurgical therapy.  Margins:       Margin Status for Invasive Carcinoma: All margins are negative for  invasive carcinoma       Distance from Invasive Carcinoma to Radial (Circumferential) Margin  (required for rectal            tumors): Not applicable (not a rectal tumor)       Distance from Invasive Carcinoma to proximal margin: 11 cm.       Distance from invasive carcinoma to distal margin: 12 cm.  Regional Lymph Nodes:       Number of Lymph Nodes with Tumor: None.       Number of Lymph Nodes Examined: Twenty-two (22)  Tumor Deposits: Present (slide A11)  Distant Metastasis:       Distant Site(s) Involved: None known.  Pathologic Stage Classification (pTNM, AJCC 8th Edition): pT3 pN1c  Ancillary Studies: MMR / MSI testing will be ordered on block A7.  Representative Tumor Block: A7  Comments: Omentum is free of neoplastic invasion.                      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   (v4.2.0.1)     Nevaeha Finerty DESCRIPTION:  Specimen: Right colon, received fresh  Specimen integrity: There is a 3 cm transmural defect in the ascending  colon at the tumor.  Specimen length: The specimen consists of 7 cm of terminal ileum and 24  cm of colon.  Tumor location: Ascending colon  Tumor size: The tumor consists of a circumferential, indurated tan-red  ulcerated mass measuring 8 cm in length and 9 cm in circumference.  The  tumor measures 2.5 cm maximum thickness.  Percent of bowel circumference involved: 100%  Tumor distance to margins:                       Proximal: 11 cm                       Distal: 12 cm                       Radial : 1 cm.   Macroscopic extent of tumor invasion: The tumor extends through the wall  into the surrounding soft tissue.  The tumor infiltrates an adherent  portion of omentum.  Total presumed lymph nodes: There are 22 ovoid nodules grossly  consistent with lymph nodes varying in size from 0.3 to 1.5 cm in  greatest dimension.  Extramural satellite tumor nodules: Not grossly identified.  Mucosal polyp(s): Not grossly identified.  Additional findings: The uninvolved mucosa is glistening and tan.  The  appendix is present and measures 5.2 cm in length and up to 1.1 cm in  diameter.  There are dense adhesions present at the tip.  Block summary:  24 blocks submitted  1 = proximal margin  2 = distal margin  3, 4 = inked soft tissue margin as disrupted area  4 = tumor with adherent omentum  6-8 = tumor  9 = appendix  10, 11 = 10 whole lymph nodes  12-23 = 1 sectioned lymph node each  24 = sections of omentum away from tumor [facility] 09/15/2021)     Final Diagnosis performed by Unknown Jim, MD.   Electronically signed  09/16/2021  Technical component performed at [facility], [place]  [address], [place], [place] [address].   Professional component performed at [employer]. [facility],  [place] [address] [place], [place] [address].   Immunohistochemistry Technical component (if applicable) was performed  at [facility]. [address], [place],  [place], [place] [address].   IMMUNOHISTOCHEMISTRY DISCLAIMER (if applicable):  Some of these immunohistochemical stains may have been developed and the  performance characteristics determine by [facility]. Some  may not have been cleared or approved by the U.S. Food and Drug  Administration. The FDA has determined that such clearance or approval  is not necessary. This test is used for clinical purposes. It should not  be regarded as investigational or for research. This laboratory is  certified under the  [place]  (CLIA-88) as qualified to perform high complexity clinical laboratory  testing.  The controls stained appropriately.      ADDENDUM:   Mismatch Repair Protein (IHC)   SUMMARY INTERPRETATION: ABNORMAL   There is loss of the major and minor MMR proteins MLH1 and PMS2. The  loss of expression may be secondary to promoter hyper-methylation, gene  mutation or other genetic event. BRAF mutation testing and/or MLH1  methylation testing is indicated. The presence of a BRAF mutation and/or  MLH1 hypermethylation is  indicative of a sporadic-type tumor. The  absence of either BRAF mutation and/or presence of normal methylation  indicate the possible presence of a hereditary germline mutation (e.g.  Lynch syndrome) and referral to genetic counseling is warranted. It is  recommended that the loss of protein expression be correlated with  molecular based MSI testing.   IHC EXPRESSION RESULTS   TEST           RESULT  MLH1:          LOSS OF NUCLEAR EXPRESSION  MSH2:          Preserved nuclear expression  MSH6:          Preserved nuclear expression  PMS2:          LOSS OF NUCLEAR EXPRESSION   References:  1. Guidelines on Genetic Evaluation and Management of Lynch Syndrome: A  Consensus Statement by the [place] Multi-Society Task Force on Colorectal  Cancer Gae Dry. Sherlie Ban , MD, and other . Am Nicki [place] 2014;  [phone]; doi: 10.1038/ajg.2014.186; published online 10 October 2012  2. Outcomes of screening endometrial cancer patients for Lynch syndrome  by patient-administered checklist. Olena Heckle MS, and others. Gynecol Oncol  2013;131(3):619-623.  3. Muir-Torre syndrome (MTS): An update and approach to diagnosis and  management. Shelly Flatten, MD and others. J Am Acad Dermatol  [phone].          Addendum #1 performed by Jaquita Folds, MD.   Electronically signed  09/18/2021  Technical and / or Professional components  performed at [facility], [place] [address] [place], [place] [address].   Immunohistochemistry Technical component (if applicable) was performed  at [facility]. [address] [place],  [place], [place] [address].   IMMUNOHISTOCHEMISTRY DISCLAIMER (if applicable):  Some of these immunohistochemical stains may have been developed and the  performance characteristics determine by [facility]. Some  may not have been cleared or approved by the U.S. Food and Drug  Administration. The FDA has determined that such clearance or approval  is not necessary. This test is used for clinical purposes. It should not  be regarded as investigational or for research. This laboratory is  certified under the [place]  (CLIA-88) as qualified to perform high complexity clinical laboratory  testing.  The controls stained appropriately.   Consults: Physical Therapy, Occupational Therapy, and Case Management / Social Work  Hospital Course:   The patient underwent the surgery above.  Postoperatively, the patient gradually mobilized and advanced to a solid diet.  Pain and other symptoms were treated aggressively.  Patient did struggle with I ileus.  However with rehydration and stabilization that seem to gradually open up.  Hypothyroidism, hypertension, chronic kidney disease followed closely.  Prediabetes.  Sliding scale insulin given.  Did have drop in his hemoglobin.  Was transfused and had good response of hemoglobin to sevens to the nines at discharge.  By the time of discharge, the patient was walking well the hallways, eating food, having flatus.  Pain was well-controlled on an oral medications.  Physical Occupational Therapy saw the patient and felt like he was stable enough to go home.  Patient claimed he had good support.  Case management agreed.  Based on meeting discharge criteria and continuing to recover, I felt  it was safe for the patient to be discharged from the hospital to further recover with close followup.  Patient aware of post appointment in 2 weeks.  Pathology came  back showing stage III cancer.  Patient will be discussed at GI tumor board for further recommendations.  Dr. Dema Severin had discussed and updated the patient.  Postoperative recommendations were discussed in detail.  They are written as well.  Discharged Condition: good  Discharge Exam: Blood pressure 130/68, pulse 78, temperature 97.9 F (36.6 C), temperature source Oral, resp. rate 16, height $RemoveBe'6\' 1"'xmlhocSuw$  (1.854 m), weight 91.4 kg, SpO2 100 %.  General: Pt awake/alert/oriented x4 in No acute distress Eyes: PERRL, normal EOM.  Sclera clear.  No icterus Neuro: CN II-XII intact w/o focal sensory/motor deficits. Lymph: No head/neck/groin lymphadenopathy Psych:  No delerium/psychosis/paranoia HENT: Normocephalic, Mucus membranes moist.  No thrush Neck: Supple, No tracheal deviation Chest:  No chest wall pain w good excursion CV:  Pulses intact.  Regular rhythm MS: Normal AROM mjr joints.  No obvious deformity Abdomen: Soft.  Nondistended.  Nontender.  No evidence of peritonitis.  No incarcerated hernias. Ext:  SCDs BLE.  No mjr edema.  No cyanosis Skin: No petechiae / purpura   Disposition:    Follow-up Information     Ileana Roup, MD Follow up in 2 week(s).   Specialties: General Surgery, Colon and Rectal Surgery Contact information: [place] [address] [phone]                 Discharge disposition: 01-Home or Self Care       Discharge Instructions     Call MD for:   Complete by: As directed    FEVER > 101.5 F  (temperatures < 101.5 F are not significant)   Call MD for:  extreme fatigue   Complete by: As directed    Call MD for:  persistant dizziness or light-headedness   Complete by: As directed    Call MD for:  persistant nausea and vomiting   Complete  by: As directed    Call MD for:  redness, tenderness, or signs of infection (pain, swelling, redness, odor or green/yellow discharge around incision site)   Complete by: As directed    Call MD for:  severe uncontrolled pain   Complete by: As directed    Diet - low sodium heart healthy   Complete by: As directed    Start with a bland diet such as soups, liquids, starchy foods, low fat foods, etc. the first few days at home. Gradually advance to a solid, low-fat, high fiber diet by the end of the first week at home.   Add a fiber supplement to your diet (Metamucil, etc) If you feel full, bloated, or constipated, stay on a full liquid or pureed/blenderized diet for a few days until you feel better and are no longer constipated.   Discharge instructions   Complete by: As directed    See Discharge Instructions If you are not getting better after two weeks or are noticing you are getting worse, contact our office [phone] for further advice.  We may need to adjust your medications, re-evaluate you in the office, send you to the emergency room, or see what other things we can do to help. The clinic staff is available to answer your questions during regular business hours (8:30am-5pm).  Please don't hesitate to call and ask to speak to one of our nurses for clinical concerns.    A surgeon from [facility] is always on call at the hospitals 24 hours/day If you have a medical emergency, go to the nearest emergency room or call  911.   Discharge wound care:   Complete by: As directed    It is good for closed incisions and even open wounds to be washed every day.  Shower every day.  Short baths are fine.  Wash the incisions and wounds clean with soap & water.    You may leave closed incisions open to air if it is dry.   You may cover the incision with clean gauze & replace it after your daily shower for comfort.  DERMABOND:  You have purple skin glue (Dermabond) on your incision(s).   Leave them in place, and they will fall off on their own like a scab in 2-3 weeks.  You may trim any edges that curl up with clean scissors.   Driving Restrictions   Complete by: As directed    You may drive when: - you are no longer taking narcotic prescription pain medication - you can comfortably wear a seatbelt - you can safely make sudden turns/stops without pain.   Increase activity slowly   Complete by: As directed    Start light daily activities --- self-care, walking, climbing stairs- beginning the day after surgery.  Gradually increase activities as tolerated.  Control your pain to be active.  Stop when you are tired.  Ideally, walk several times a day, eventually an hour a day.   Most people are back to most day-to-day activities in a few weeks.  It takes 4-6 weeks to get back to unrestricted, intense activity. If you can walk 30 minutes without difficulty, it is safe to try more intense activity such as jogging, treadmill, bicycling, low-impact aerobics, swimming, etc. Save the most intensive and strenuous activity for last (Usually 4-8 weeks after surgery) such as sit-ups, heavy lifting, contact sports, etc.  Refrain from any intense heavy lifting or straining until you are off narcotics for pain control.  You will have off days, but things should improve week-by-week. DO NOT PUSH THROUGH PAIN.  Let pain be your guide: If it hurts to do something, don't do it.   Lifting restrictions   Complete by: As directed    If you can walk 30 minutes without difficulty, it is safe to try more intense activity such as jogging, treadmill, bicycling, low-impact aerobics, swimming, etc. Save the most intensive and strenuous activity for last (Usually 4-8 weeks after surgery) such as sit-ups, heavy lifting, contact sports, etc.   Refrain from any intense heavy lifting or straining until you are off narcotics for pain control.  You will have off days, but things should improve week-by-week. DO NOT PUSH  THROUGH PAIN.  Let pain be your guide: If it hurts to do something, don't do it.  Pain is your body warning you to avoid that activity for another week until the pain goes down.   May shower / Bathe   Complete by: As directed    May walk up steps   Complete by: As directed    Sexual Activity Restrictions   Complete by: As directed    You may have sexual intercourse when it is comfortable. If it hurts to do something, stop.       Allergies as of 09/22/2021   No Known Allergies      Medication List     TAKE these medications    acetaminophen 500 MG tablet Commonly known as: TYLENOL Take 500-1,000 mg by mouth every 6 (six) hours as needed for moderate pain.   ARTIFICIAL TEAR SOLUTION OP Place 1 drop into  both eyes daily as needed (dry eyes).   cyanocobalamin 1000 MCG/ML injection Commonly known as: (VITAMIN B-12) Inject 1,000 mcg into the muscle every 30 (thirty) days.   Iron (Ferrous Sulfate) 325 (65 Fe) MG Tabs Take 325 mg by mouth daily.   levothyroxine 75 MCG tablet Commonly known as: SYNTHROID Take 75 mcg by mouth daily.   lisinopril 20 MG tablet Commonly known as: ZESTRIL Take 20 mg by mouth daily.   omeprazole 40 MG capsule Commonly known as: PRILOSEC Take 1 capsule (40 mg total) by mouth daily. 30 minutes before meals What changed: when to take this   polyethylene glycol powder 17 GM/SCOOP powder Commonly known as: GLYCOLAX/MIRALAX Take 17 g by mouth as needed for mild constipation. What changed: when to take this   traMADol 50 MG tablet Commonly known as: ULTRAM Take 1-2 tablets (50-100 mg total) by mouth every 6 (six) hours as needed (postop pain not controlled with ibuprofen first).   vitamin C 500 MG tablet Commonly known as: ASCORBIC ACID Take 500 mg by mouth daily.       ASK your doctor about these medications    traMADol 50 MG tablet Commonly known as: Ultram Take 1 tablet by mouth every 6 hours as needed for up to 5 days (postop pain not  controlled with tylenol first). Ask about: Should I take this medication?               Discharge Care Instructions  (From admission, onward)           Start     Ordered   09/22/21 0000  Discharge wound care:       Comments: It is good for closed incisions and even open wounds to be washed every day.  Shower every day.  Short baths are fine.  Wash the incisions and wounds clean with soap & water.    You may leave closed incisions open to air if it is dry.   You may cover the incision with clean gauze & replace it after your daily shower for comfort.  DERMABOND:  You have purple skin glue (Dermabond) on your incision(s).  Leave them in place, and they will fall off on their own like a scab in 2-3 weeks.  You may trim any edges that curl up with clean scissors.   09/22/21 0930            Significant Diagnostic Studies:  Results for orders placed or performed during the hospital encounter of 09/13/21 (from the past 72 hour(s))  Glucose, capillary     Status: Abnormal   Collection Time: 09/19/21 11:36 AM  Result Value Ref Range   Glucose-Capillary 140 (H) 70 - 99 mg/dL    Comment: Glucose reference range applies only to samples taken after fasting for at least 8 hours.  Glucose, capillary     Status: Abnormal   Collection Time: 09/19/21  1:10 PM  Result Value Ref Range   Glucose-Capillary 141 (H) 70 - 99 mg/dL    Comment: Glucose reference range applies only to samples taken after fasting for at least 8 hours.  Glucose, capillary     Status: Abnormal   Collection Time: 09/19/21  5:13 PM  Result Value Ref Range   Glucose-Capillary 153 (H) 70 - 99 mg/dL    Comment: Glucose reference range applies only to samples taken after fasting for at least 8 hours.  Glucose, capillary     Status: Abnormal   Collection Time: 09/19/21 10:27 PM  Result Value Ref Range   Glucose-Capillary 129 (H) 70 - 99 mg/dL    Comment: Glucose reference range applies only to samples taken after  fasting for at least 8 hours.  CBC     Status: Abnormal   Collection Time: 09/20/21  5:10 AM  Result Value Ref Range   WBC 7.9 4.0 - 10.5 K/uL   RBC 3.02 (L) 4.22 - 5.81 MIL/uL   Hemoglobin 7.8 (L) 13.0 - 17.0 g/dL   HCT 25.3 (L) 39.0 - 52.0 %   MCV 83.8 80.0 - 100.0 fL   MCH 25.8 (L) 26.0 - 34.0 pg   MCHC 30.8 30.0 - 36.0 g/dL   RDW 15.9 (H) 11.5 - 15.5 %   Platelets 373 150 - 400 K/uL   nRBC 0.0 0.0 - 0.2 %    Comment: Performed at [facility], [place] [address] [place], [place] [address]  Magnesium     Status: None   Collection Time: 09/20/21  5:10 AM  Result Value Ref Range   Magnesium 2.0 1.7 - 2.4 mg/dL    Comment: Performed at [facility], [place] [address] [place], [place] [address]  Potassium     Status: None   Collection Time: 09/20/21  5:10 AM  Result Value Ref Range   Potassium 4.2 3.5 - 5.1 mmol/L    Comment: Performed at [facility], [place] [address] [place], [place] [address]  Creatinine, serum     Status: None   Collection Time: 09/20/21  5:10 AM  Result Value Ref Range   Creatinine, Ser 1.10 0.61 - 1.24 mg/dL   GFR, Estimated >60 >60 mL/min    Comment: (NOTE) Calculated using the CKD-EPI Creatinine Equation (2021) Performed at [facility], [place] [address], [place], [place] [address]   Hemoglobin A1c     Status: Abnormal   Collection Time: 09/20/21  5:10 AM  Result Value Ref Range   Hgb A1c MFr Bld 6.5 (H) 4.8 - 5.6 %    Comment: (NOTE) Pre diabetes:          5.7%-6.4%  Diabetes:              >6.4%  Glycemic control for   <7.0% adults with diabetes    Mean Plasma Glucose 139.85 mg/dL    Comment: Performed at [place] [address] [place], [place] [address]  Glucose, capillary     Status: Abnormal   Collection Time: 09/20/21  7:53 AM  Result Value Ref Range   Glucose-Capillary 130 (H) 70 - 99 mg/dL    Comment: Glucose reference range applies only to samples taken  after fasting for at least 8 hours.  Glucose, capillary     Status: Abnormal   Collection Time: 09/20/21 11:36 AM  Result Value Ref Range   Glucose-Capillary 117 (H) 70 - 99 mg/dL    Comment: Glucose reference range applies only to samples taken after fasting for at least 8 hours.  Glucose, capillary     Status: Abnormal   Collection Time: 09/20/21  4:44 PM  Result Value Ref Range   Glucose-Capillary 133 (H) 70 - 99 mg/dL    Comment: Glucose reference range applies only to samples taken after fasting for at least 8 hours.  Glucose, capillary     Status: Abnormal   Collection Time: 09/20/21  9:18 PM  Result Value Ref Range   Glucose-Capillary 115 (H) 70 - 99 mg/dL    Comment: Glucose  reference range applies only to samples taken after fasting for at least 8 hours.  Potassium     Status: None   Collection Time: 09/21/21  4:28 AM  Result Value Ref Range   Potassium 3.9 3.5 - 5.1 mmol/L    Comment: Performed at [facility], [place] [address] [place], [place] [address]  CBC     Status: Abnormal   Collection Time: 09/21/21  4:28 AM  Result Value Ref Range   WBC 5.1 4.0 - 10.5 K/uL   RBC 2.72 (L) 4.22 - 5.81 MIL/uL   Hemoglobin 7.0 (L) 13.0 - 17.0 g/dL   HCT 22.9 (L) 39.0 - 52.0 %   MCV 84.2 80.0 - 100.0 fL   MCH 25.7 (L) 26.0 - 34.0 pg   MCHC 30.6 30.0 - 36.0 g/dL   RDW 15.9 (H) 11.5 - 15.5 %   Platelets 324 150 - 400 K/uL   nRBC 0.0 0.0 - 0.2 %    Comment: Performed at [facility], [place] [address], [place], [place] [address]  Creatinine, serum     Status: None   Collection Time: 09/21/21  4:28 AM  Result Value Ref Range   Creatinine, Ser 0.96 0.61 - 1.24 mg/dL   GFR, Estimated >60 >60 mL/min    Comment: (NOTE) Calculated using the CKD-EPI Creatinine Equation (2021) Performed at [facility], [place] [address], [place], [place] [address]   Glucose, capillary     Status: Abnormal   Collection Time: 09/21/21  7:28 AM  Result  Value Ref Range   Glucose-Capillary 110 (H) 70 - 99 mg/dL    Comment: Glucose reference range applies only to samples taken after fasting for at least 8 hours.  Glucose, capillary     Status: Abnormal   Collection Time: 09/21/21 11:45 AM  Result Value Ref Range   Glucose-Capillary 123 (H) 70 - 99 mg/dL    Comment: Glucose reference range applies only to samples taken after fasting for at least 8 hours.  Prepare RBC (crossmatch)     Status: None   Collection Time: 09/21/21 12:58 PM  Result Value Ref Range   Order Confirmation      ORDER PROCESSED BY BLOOD BANK Performed at [facility], [place] [address], [place], [place] [address]   Glucose, capillary     Status: Abnormal   Collection Time: 09/21/21  5:13 PM  Result Value Ref Range   Glucose-Capillary 129 (H) 70 - 99 mg/dL    Comment: Glucose reference range applies only to samples taken after fasting for at least 8 hours.  Type and screen [place]     Status: None (Preliminary result)   Collection Time: 09/21/21  8:56 PM  Result Value Ref Range   ABO/RH(D) O POS    Antibody Screen NEG    Sample Expiration 09/24/2021,2359    Unit Number [id number]    Blood Component Type RED CELLS,LR    Unit division 00    Status of Unit ISSUED    Transfusion Status OK TO TRANSFUSE    Crossmatch Result      Compatible Performed at [facility], [place] [address], [place], [place] [address]   Glucose, capillary     Status: Abnormal   Collection Time: 09/21/21  9:09 PM  Result Value Ref Range   Glucose-Capillary 125 (H) 70 - 99 mg/dL    Comment: Glucose reference range applies only to samples taken after fasting for at  least 8 hours.  Hemoglobin and hematocrit, blood     Status: Abnormal   Collection Time: 09/22/21  7:08 AM  Result Value Ref Range   Hemoglobin 9.7 (L) 13.0 - 17.0 g/dL    Comment: REPEATED TO VERIFY POST TRANSFUSION SPECIMEN DELTA CHECK NOTED    HCT 30.8 (L) 39.0 -  52.0 %    Comment: Performed at [facility], [address] [place], [place] [address]  Glucose, capillary     Status: Abnormal   Collection Time: 09/22/21  7:45 AM  Result Value Ref Range   Glucose-Capillary 103 (H) 70 - 99 mg/dL    Comment: Glucose reference range applies only to samples taken after fasting for at least 8 hours.    DG Chest Portable 1 View  Result Date: 09/13/2021 CLINICAL DATA:  Syncope EXAM: PORTABLE CHEST 1 VIEW COMPARISON:  10/06/2008 FINDINGS: Heart and mediastinal contours are within normal limits. No focal opacities or effusions. No acute bony abnormality. Aortic atherosclerosis. IMPRESSION: No active disease. Electronically Signed   By: Charlett Nose M.D.   On: 09/13/2021 18:13    Past Medical History:  Diagnosis Date   Acute cholecystitis 02/19/2018   Anemia    Chronic kidney disease    GERD (gastroesophageal reflux disease)    Hypertension     Past Surgical History:  Procedure Laterality Date   CHOLECYSTECTOMY N/A 02/20/2018   Procedure: LAPAROSCOPIC CHOLECYSTECTOMY;  Surgeon: Glenna Fellows, MD;  Location: [facility];  Service: General;  Laterality: N/A;   COLONOSCOPY  2023   LAPAROSCOPIC RIGHT HEMI COLECTOMY Right 09/14/2021   Procedure: LAPAROSCOPIC TO OPEN RIGHT HEMI COLECTOMY;  Surgeon: Andria Meuse, MD;  Location: [facility];  Service: General;  Laterality: Right;   NO PAST SURGERIES      Social History   Socioeconomic History   Marital status: Married    Spouse name: Not on file   Number of children: Not on file   Years of education: Not on file   Highest education level: Not on file  Occupational History   Not on file  Tobacco Use   Smoking status: Former    Packs/day: 1.00    Years: 20.00    Total pack years: 20.00    Types: Cigarettes    Quit date: 1980    Years since quitting: 43.5   Smokeless tobacco: Never   Tobacco comments:    Smoked cigarettes x20 years, </= 1 PPD. Quit 45 years ago  Vaping Use    Vaping Use: Never used  Substance and Sexual Activity   Alcohol use: Not Currently   Drug use: Not Currently   Sexual activity: Not on file  Other Topics Concern   Not on file  Social History Narrative   Not on file   Social Determinants of Health   Financial Resource Strain: Not on file  Food Insecurity: Not on file  Transportation Needs: Not on file  Physical Activity: Not on file  Stress: Not on file  Social Connections: Not on file  Intimate Partner Violence: Not on file    Family History  Problem Relation Age of Onset   Rectal cancer Mother    Heart attack Father     Current Facility-Administered Medications  Medication Dose Route Frequency Provider Last Rate Last Admin   0.9 %  sodium chloride infusion (Manually program via Guardrails IV Fluids)   Intravenous Once Andria Meuse, MD       0.9 %  sodium chloride infusion  250 mL Intravenous PRN Michael Boston, MD       alum & mag hydroxide-simeth (MAALOX/MYLANTA) 200-200-20 MG/5ML suspension 30 mL  30 mL Oral Q6H PRN Ileana Roup, MD   30 mL at 09/17/21 2116   ascorbic acid (VITAMIN C) tablet 500 mg  500 mg Oral Daily Ileana Roup, MD   500 mg at 09/21/21 1037   diphenhydrAMINE (BENADRYL) 12.5 MG/5ML elixir 12.5 mg  12.5 mg Oral Q6H PRN Ileana Roup, MD       Or   diphenhydrAMINE (BENADRYL) injection 12.5 mg  12.5 mg Intravenous Q6H PRN Ileana Roup, MD       feeding supplement (ENSURE SURGERY) liquid 237 mL  237 mL Oral BID BM Ileana Roup, MD   237 mL at 09/21/21 1037   heparin injection 5,000 Units  5,000 Units Subcutaneous Q8H Ileana Roup, MD   5,000 Units at 09/22/21 0511   hydrALAZINE (APRESOLINE) injection 10 mg  10 mg Intravenous Q2H PRN Ileana Roup, MD       HYDROmorphone (DILAUDID) injection 0.5 mg  0.5 mg Intravenous Q4H PRN Michael Boston, MD       insulin aspart (novoLOG) injection 0-15 Units  0-15 Units Subcutaneous TID Daun Peacock, MD   2  Units at 09/21/21 1821   insulin aspart (novoLOG) injection 0-5 Units  0-5 Units Subcutaneous Ardeen Fillers, MD       levothyroxine (SYNTHROID) tablet 75 mcg  75 mcg Oral Q0600 Ileana Roup, MD   75 mcg at 09/22/21 5701   lip balm (CARMEX) ointment   Topical BID Michael Boston, MD   Given at 09/21/21 2135   magic mouthwash  15 mL Oral QID PRN Michael Boston, MD       methocarbamol (ROBAXIN) 1,000 mg in dextrose 5 % 100 mL IVPB  1,000 mg Intravenous Q6H PRN Michael Boston, MD       metoprolol tartrate (LOPRESSOR) injection 5 mg  5 mg Intravenous Q6H PRN Michael Boston, MD       naphazoline-glycerin (CLEAR EYES REDNESS) ophth solution 1-2 drop  1-2 drop Both Eyes QID PRN Michael Boston, MD       ondansetron [facility]) injection 4 mg  4 mg Intravenous Q6H PRN Michael Boston, MD   4 mg at 09/19/21 2111   Or   ondansetron (ZOFRAN) 8 mg in sodium chloride 0.9 % 50 mL IVPB  8 mg Intravenous Q6H PRN Michael Boston, MD       ondansetron [facility]) tablet 4 mg  4 mg Oral Q6H PRN Ileana Roup, MD       pantoprazole (PROTONIX) EC tablet 40 mg  40 mg Oral Q1200 Michael Boston, MD   40 mg at 09/21/21 1329   polycarbophil (FIBERCON) tablet 625 mg  625 mg Oral BID Michael Boston, MD   625 mg at 09/21/21 2135   prochlorperazine (COMPAZINE) injection 5-10 mg  5-10 mg Intravenous Q4H PRN Michael Boston, MD   10 mg at 09/19/21 2208   simethicone (MYLICON) chewable tablet 40 mg  40 mg Oral Q6H PRN Ileana Roup, MD       sodium chloride (OCEAN) 0.65 % nasal spray 1-2 spray  1-2 spray Each Nare Q6H PRN Michael Boston, MD       sodium chloride flush (NS) 0.9 % injection 3 mL  3 mL Intravenous Gorden Harms, MD   3 mL at 09/21/21 2135   sodium chloride flush (NS) 0.9 % injection  3 mL  3 mL Intravenous PRN Michael Boston, MD       traMADol Veatrice Bourbon) tablet 50-100 mg  50-100 mg Oral Q6H PRN Michael Boston, MD         No Known Allergies  Signed:   Adin Hector, MD, FACS, MASCRS Esophageal,  Gastrointestinal & Colorectal Surgery Robotic and Minimally Invasive Surgery  [facility], [place]. [address] [place], [place] [address] [phone] Fax [phone] Main  CONTACT INFORMATION:  Weekday (9AM-5PM): Call [facility] main office at [phone]  Weeknight (5PM-9AM) or Weekend/Holiday: Check [email or web address] (password " [email or web address]") for General Surgery [facility] coverage  (Please, do not use SecureChat as it is not reliable communication to operating surgeons for immediate patient care)      09/22/2021, 9:30 AM

## 2021-09-22 NOTE — Plan of Care (Signed)
  Problem: Education: Goal: Understanding of discharge needs will improve Outcome: Adequate for Discharge Goal: Verbalization of understanding of the causes of altered bowel function will improve Outcome: Adequate for Discharge   Problem: Bowel/Gastric: Goal: Gastrointestinal status for postoperative course will improve Outcome: Adequate for Discharge   Problem: Health Behavior/Discharge Planning: Goal: Identification of community resources to assist with postoperative recovery needs will improve Outcome: Adequate for Discharge   Problem: Nutritional: Goal: Will attain and maintain optimal nutritional status will improve Outcome: Adequate for Discharge   Problem: Clinical Measurements: Goal: Postoperative complications will be avoided or minimized Outcome: Adequate for Discharge   Problem: Skin Integrity: Goal: Will show signs of wound healing Outcome: Adequate for Discharge   Problem: Education: Goal: Knowledge of General Education information will improve Description: Including pain rating scale, medication(s)/side effects and non-pharmacologic comfort measures Outcome: Adequate for Discharge   Problem: Health Behavior/Discharge Planning: Goal: Ability to manage health-related needs will improve Outcome: Adequate for Discharge   Problem: Clinical Measurements: Goal: Ability to maintain clinical measurements within normal limits will improve Outcome: Adequate for Discharge Goal: Will remain free from infection Outcome: Adequate for Discharge Goal: Diagnostic test results will improve Outcome: Adequate for Discharge Goal: Respiratory complications will improve Outcome: Adequate for Discharge Goal: Cardiovascular complication will be avoided Outcome: Adequate for Discharge   Problem: Activity: Goal: Risk for activity intolerance will decrease Outcome: Adequate for Discharge   Problem: Nutrition: Goal: Adequate nutrition will be maintained Outcome: Adequate for  Discharge   Problem: Coping: Goal: Level of anxiety will decrease Outcome: Adequate for Discharge   Problem: Elimination: Goal: Will not experience complications related to bowel motility Outcome: Adequate for Discharge Goal: Will not experience complications related to urinary retention Outcome: Adequate for Discharge   Problem: Pain Managment: Goal: General experience of comfort will improve Outcome: Adequate for Discharge   Problem: Safety: Goal: Ability to remain free from injury will improve Outcome: Adequate for Discharge   Problem: Skin Integrity: Goal: Risk for impaired skin integrity will decrease Outcome: Adequate for Discharge   Problem: Education: Goal: Ability to describe self-care measures that may prevent or decrease complications (Diabetes Survival Skills Education) will improve Outcome: Adequate for Discharge Goal: Individualized Educational Video(s) Outcome: Adequate for Discharge   Problem: Coping: Goal: Ability to adjust to condition or change in health will improve Outcome: Adequate for Discharge   Problem: Fluid Volume: Goal: Ability to maintain a balanced intake and output will improve Outcome: Adequate for Discharge   Problem: Health Behavior/Discharge Planning: Goal: Ability to identify and utilize available resources and services will improve Outcome: Adequate for Discharge Goal: Ability to manage health-related needs will improve Outcome: Adequate for Discharge   Problem: Metabolic: Goal: Ability to maintain appropriate glucose levels will improve Outcome: Adequate for Discharge   Problem: Nutritional: Goal: Maintenance of adequate nutrition will improve Outcome: Adequate for Discharge Goal: Progress toward achieving an optimal weight will improve Outcome: Adequate for Discharge   Problem: Skin Integrity: Goal: Risk for impaired skin integrity will decrease Outcome: Adequate for Discharge   Problem: Tissue Perfusion: Goal: Adequacy  of tissue perfusion will improve Outcome: Adequate for Discharge

## 2021-09-22 NOTE — Progress Notes (Signed)
PT Cancellation Note  Patient Details Name: Joseph Pennington MRN: [id number] DOB: 10-Mar-1932   Cancelled Treatment:    Reason Eval/Treat Not Completed: (P) PT screened, no needs identified, will sign off (Pt reports at baseline and is being discharged soon, daughter is on her way. Pt has no further questions or concerns, reports he will use RW for increased stability and safety at home.) PT is signing off, if needs change please re-consult.  Coolidge Breeze, PT, DPT [facility] Department Office: [phone] Pager: [phone]   Coolidge Breeze 09/22/2021, 10:39 AM

## 2021-09-23 ENCOUNTER — Other Ambulatory Visit: Payer: Self-pay

## 2021-09-23 LAB — TYPE AND SCREEN
ABO/RH(D): O POS
Antibody Screen: NEGATIVE
Unit division: 0

## 2021-09-23 LAB — BPAM RBC
Blood Product Expiration Date: 202307262359
ISSUE DATE / TIME: 202307040000
Unit Type and Rh: 5100

## 2021-09-23 NOTE — Progress Notes (Signed)
The proposed treatment discussed in conference is for discussion purpose only and is not a binding recommendation.  The patients have not been physically examined, or presented with their treatment options.  Therefore, final treatment plans cannot be decided.  

## 2021-09-24 ENCOUNTER — Telehealth: Payer: Self-pay | Admitting: Hematology

## 2021-09-24 NOTE — Telephone Encounter (Signed)
.  Called patient to schedule appointment per 7/5 inbasket, patient is aware of date and time.   

## 2021-10-07 NOTE — Progress Notes (Addendum)
[place]   [phone] [phone]   Clinic Follow up Note   Patient Care Team: Shirline Frees, MD as PCP - General (Family Medicine) 10/08/2021  CHIEF COMPLAINT: Follow up colon cancer, review surgical path   SUMMARY OF ONCOLOGIC HISTORY: Oncology History  Ascending colon cancer s/p lap colectomy 09/14/2021  07/23/2021 Procedure   Upper GI Endoscopy/Colonoscopy; Dr. Lorenso Courier  Colonoscopy Impression: - Likely malignant partially obstructing tumor in the ascending colon. Biopsied. - Six 3 to 8 mm polyps in the rectum and in the transverse colon, removed with a cold snare. Resected and retrieved. - Non-bleeding internal hemorrhoids.  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 Biopsied. - Multiple gastric polyps. Resected and retrieved. - Erythematous mucosa in the antrum. Biopsied. - Mucosal nodule found in the duodenum. Biopsied. - Dilated lacteals were found in the duodenum. Biopsied. - Two non-bleeding angioectasias in the duodenum.   07/23/2021 Initial Biopsy   Diagnosis 1. Duodenum, Biopsy - BENIGN DUODENAL MUCOSA - NO ACUTE INFLAMMATION, VILLOUS BLUNTING OR INCREASED INTRAEPITHELIAL LYMPHOCYTES IDENTIFIED 2. Duodenum, Biopsy, Nodule - PEPTIC DUODENITIS - NO DYSPLASIA OR MALIGNANCY IDENTIFIED 3. Stomach, biopsy - MILD CHRONIC GASTRITIS WITHOUT ACTIVITY - NO INTESTINAL METAPLASIA IDENTIFIED - SEE COMMENT 4. Stomach, polyp(s) - HYPERPLASTIC GASTRIC POLYP(S) WITH INTESTINAL METAPLASIA - NO H. PYLORI OR MALIGNANCY IDENTIFIED 5. Esophagogastric junction, biopsy - SQUAMOCOLUMNAR JUNCTION WITH CHRONIC INFLAMMATION - NO INTESTINAL METAPLASIA, DYSPLASIA OR MALIGNANCY IDENTIFIED 6. Rectum, biopsy, and transverse - MULTIPLE FRAGMENTS OF HYPERPLASTIC POLYP(S) - NO HIGH-GRADE DYSPLASIA OR MALIGNANCY IDENTIFIED 7. Ascending Colon Biopsy, Mass - POORLY DIFFERENTIATED ADENOCARCINOMA -  SEE COMMENT  Microscopic Comment 7. By immunohistochemistry, the neoplastic cells are positive for CDX2 (patchy, weak) but negative for p63, p40, CD56, chromogranin and synaptophysin. This immunoprofile is consistent with a poorly differentiated adenocarcinoma.  3. H. pylori immunohistochemistry is POSITIVE for RARE microorganisms.   07/30/2021 Imaging   EXAM: CT CHEST, ABDOMEN, AND PELVIS WITH CONTRAST  IMPRESSION: 1. Masslike region in the ascending colon extending to the level of the hepatic flexure in the right lower quadrant, concerning for malignancy. Associated pericolonic fat stranding and a few prominent lymph nodes are seen adjacent to this segment of colon, suspicious for local infiltration. No distant metastasis is seen. 2. Left inguinal hernia containing nonobstructed colon. 3. Nonspecific small ground-glass opacity in the in the right lower lobe. Attention on follow-up is recommended. 4. Small hiatal hernia. 5. Aortic atherosclerosis and coronary artery calcifications   08/06/2021 Initial Diagnosis   Malignant neoplasm of ascending colon [facility])   Primary adenocarcinoma of ascending colon ([place])  07/23/2021 Procedure   Colonoscopy by Dr. Lorenso Courier - A frond-like/villous, fungating and ulcerated partially obstructing large mass was found in the ascending colon. There was a point at which the mass could not be bypassed, even with use of an ultraslim colonoscope. It is suspected that this mass encompasses the entire cecum and ascending colon. The mass was circumferential. The mass measured fifteen cm in length. Oozing was present.    07/23/2021 Initial Biopsy   3. H. pylori immunohistochemistry is POSITIVE for RARE microorganisms.  FINAL DIAGNOSIS Diagnosis 1. Duodenum, Biopsy - BENIGN DUODENAL MUCOSA - NO ACUTE INFLAMMATION, VILLOUS BLUNTING OR INCREASED INTRAEPITHELIAL LYMPHOCYTES IDENTIFIED 2. Duodenum, Biopsy, Nodule - PEPTIC DUODENITIS - NO DYSPLASIA OR MALIGNANCY  IDENTIFIED 3. Stomach, biopsy - MILD CHRONIC GASTRITIS WITHOUT ACTIVITY - NO INTESTINAL METAPLASIA IDENTIFIED - SEE COMMENT 4. Stomach, polyp(s) - HYPERPLASTIC GASTRIC POLYP(S) WITH INTESTINAL METAPLASIA -  NO H. PYLORI OR MALIGNANCY IDENTIFIED 5. Esophagogastric junction, biopsy - SQUAMOCOLUMNAR JUNCTION WITH CHRONIC INFLAMMATION - NO INTESTINAL METAPLASIA, DYSPLASIA OR MALIGNANCY IDENTIFIED 6. Rectum, biopsy, and transverse - MULTIPLE FRAGMENTS OF HYPERPLASTIC POLYP(S) - NO HIGH-GRADE DYSPLASIA OR MALIGNANCY IDENTIFIED 7. Ascending Colon Biopsy, Mass - POORLY DIFFERENTIATED ADENOCARCINOMA - SEE COMMENT   07/30/2021 Imaging   CT CAP IMPRESSION: 1. Masslike region in the ascending colon extending to the level of the hepatic flexure in the right lower quadrant, concerning for malignancy. Associated pericolonic fat stranding and a few prominent lymph nodes are seen adjacent to this segment of colon, suspicious for local infiltration. No distant metastasis is seen. 2. Left inguinal hernia containing nonobstructed colon. 3. Nonspecific small ground-glass opacity in the in the right lower lobe. Attention on follow-up is recommended. 4. Small hiatal hernia. 5. Aortic atherosclerosis and coronary artery calcifications   09/14/2021 Surgery   PROCEDURE:  Laparoscopic converted to open right hemicolectomy Repair of small bowel serosa x 2     09/14/2021 Pathology Results   A. COLON, RIGHT, HEMI COLECTOMY:  Poorly differentiated adenocarcinoma in the ascending colon with clear margins of resection.  Tumor Size: Circumferential measuring 9.0 cm X 8.0 cm. X 2.5 cm.  Macroscopic Tumor Perforation: Not identified  Histologic Type: Adenocarcinoma.  Histologic Grade: Grade 3, poorly differentiated.  Multiple Primary Sites: Not applicable  Tumor Extension: Extends through the muscular wall into the pericolonic adipose tissue.  Lymphovascular Invasion: Not identified.  Perineural Invasion: Not  identified.  Treatment Effect: No known presurgical therapy.  Margins:       Margin Status for Invasive Carcinoma: All margins are negative for invasive carcinoma       Distance from Invasive Carcinoma to proximal margin: 11 cm.       Distance from invasive carcinoma to distal margin: 12 cm.  Regional Lymph Nodes:       Number of Lymph Nodes with Tumor: None.       Number of Lymph Nodes Examined: Twenty-two (22)  Tumor Deposits: Present (slide A11)  Distant Metastasis:       Distant Site(s) Involved: None known.  Pathologic Stage Classification (pTNM, AJCC 8th Edition): pT3 pN1c  Comments: Omentum is free of neoplastic invasion.                      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LAMN)    09/14/2021 Cancer Staging   Staging form: Colon and Rectum, AJCC 8th Edition - Pathologic stage from 09/14/2021: Stage IIIB (pT3, pN1c, cM0) - Signed by Alla Feeling, NP on 10/08/2021 Stage prefix: Initial diagnosis Total positive nodes: 0 Histologic grading system: 4 grade system Histologic grade (G): G3 Laterality: Right Lymph-vascular invasion (LVI): LVI not present (absent)/not identified Tumor deposits (TD): Present Perineural invasion (PNI): Absent Microsatellite instability (MSI): Unstable high BRAF mutation: Positive   10/08/2021 Initial Diagnosis   Primary adenocarcinoma of ascending colon ([facility])     CURRENT THERAPY: PENDING adjuvant FOLFOX q2 weeks x3 months   INTERVAL HISTORY: Mr. Stancil returns for follow up as scheduled. Last seen by [place] as new patient 08/05/21. He was taken to the OR by Dr. Dema Severin 09/14/21 for lap converted to open right hemicolectomy. He developed a postop ileus that recovered. He received IV iron for anemia and 1u pRBCs. Surgical path showed poorly differentiated adnocarcinoma of the ascending/right colon with clear margins reported, however concern was raised in tumor board about possible involvement of right kidney and potentially 2nd portion of duodenum.  The  tumor measured 9.0 x 8.0 x 2.5 cm extending through muscularis propria into pericolonic adipose tissue. All LNs negative (0/22). No LVI, PNI, or perforation. 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 This was staged pT3pN1c. There was loss of MLH1 and loss of PMS2 major mismatch repair proteins. Further testing showed the presence of BRAF mutation in exon 15: V600E and presence of MLH1 hypermethylation indicating this is a sporadic tumor rather than germline ie lynch syndrome. He was discharaged home on 7/4   Today, he presents with his daughter and son.  He is recovering from surgery, thinks he is 50% back to his baseline.  Still a little weak but very capable at home, out of bed, walking, doing stationary bike, and continuing to take care of himself and his wife.  His appetite is very good.  He has a little abdominal soreness if he turns over in bed, no overt pain.  Not taking any medication.  He has bowel movements daily which are usually loose.  Taking oral iron.  Denies blood in stool.  Denies nausea/vomiting, fever, chills, cough, chest pain, dyspnea, leg edema.  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  All other systems were reviewed with the patient and are negative.  MEDICAL HISTORY:  Past Medical History:  Diagnosis Date   Acute cholecystitis 02/19/2018   Anemia    Chronic kidney disease    GERD (gastroesophageal reflux disease)    Hypertension     SURGICAL HISTORY: Past Surgical History:  Procedure Laterality Date   CHOLECYSTECTOMY N/A 02/20/2018   Procedure: LAPAROSCOPIC CHOLECYSTECTOMY;  Surgeon: Excell Seltzer, MD;  Location: [facility];  Service: General;  Laterality: N/A;   COLONOSCOPY  2023   LAPAROSCOPIC RIGHT HEMI COLECTOMY Right 09/14/2021   Procedure: LAPAROSCOPIC TO OPEN RIGHT HEMI COLECTOMY;  Surgeon: Ileana Roup, MD;  Location: [facility];  Service: General;   Laterality: Right;   NO PAST SURGERIES      I have reviewed the social history and family history with the patient and they are unchanged from previous note.  ALLERGIES:  has No Known Allergies.  MEDICATIONS:  Current Outpatient Medications  Medication Sig Dispense Refill   acetaminophen (TYLENOL) 500 MG tablet Take 500-1,000 mg by mouth every 6 (six) hours as needed for moderate pain.     ARTIFICIAL TEAR SOLUTION OP Place 1 drop into both eyes daily as needed (dry eyes).     cyanocobalamin (,VITAMIN B-12,) 1000 MCG/ML injection Inject 1,000 mcg into the muscle every 30 (thirty) days.   0   Iron, Ferrous Sulfate, 325 (65 Fe) MG TABS Take 325 mg by mouth daily. (Patient not taking: Reported on 08/26/2021)     levothyroxine (SYNTHROID) 75 MCG tablet Take 75 mcg by mouth daily.     lisinopril (PRINIVIL,ZESTRIL) 20 MG tablet Take 20 mg by mouth daily.  3   omeprazole (PRILOSEC) 40 MG capsule Take 1 capsule (40 mg total) by mouth daily. 30 minutes before meals (Patient taking differently: Take 40 mg by mouth 2 (two) times daily. 30 minutes before meals) 30 capsule 3   polyethylene glycol powder (GLYCOLAX/MIRALAX) 17 GM/SCOOP powder Take 17 g by mouth as needed for mild constipation. (Patient taking differently: Take 17 g by mouth daily as needed for mild constipation.)     traMADol (ULTRAM) 50 MG tablet Take 1-2 tablets (50-100 mg total) by mouth every 6 (six) hours as  needed (postop pain not controlled with ibuprofen first). 20 tablet 0   vitamin C (ASCORBIC ACID) 500 MG tablet Take 500 mg by mouth daily.     No current facility-administered medications for this visit.    PHYSICAL EXAMINATION: ECOG PERFORMANCE STATUS: 1 - Symptomatic but completely ambulatory  Vitals:   10/08/21 0854  BP: 117/73  Pulse: 73  Resp: 17  Temp: 97.8 F (36.6 C)  SpO2: 100%   Filed Weights   10/08/21 0854  Weight: 196 lb 12.8 oz (89.3 kg)    GENERAL:alert, no distress and comfortable SKIN: No  rash EYES: sclera clear NECK: Without mass LYMPH:  no palpable cervical or supraclavicular lymphadenopathy LUNGS: clear with normal breathing effort HEART: regular rate & rhythm, no lower extremity edema ABDOMEN:abdomen soft, non-tender and normal bowel sounds. Midline and lap incisions closed, healing well, no erythema or drainage. NEURO: alert & oriented x 3 with fluent speech, no focal motor/sensory deficits  LABORATORY DATA:  I have reviewed the data as listed    Latest Ref Rng & Units 10/08/2021    8:29 AM 09/22/2021    7:08 AM 09/21/2021    4:28 AM  CBC  WBC 4.0 - 10.5 K/uL 4.6   5.1   Hemoglobin 13.0 - 17.0 g/dL 9.6  9.7  7.0   Hematocrit 39.0 - 52.0 % 30.4  30.8  22.9   Platelets 150 - 400 K/uL 236   324         Latest Ref Rng & Units 10/08/2021    8:29 AM 09/21/2021    4:28 AM 09/20/2021    5:10 AM  CMP  Glucose 70 - 99 mg/dL 124     BUN 8 - 23 mg/dL 15     Creatinine 0.61 - 1.24 mg/dL 1.14  0.96  1.10   Sodium 135 - 145 mmol/L 135     Potassium 3.5 - 5.1 mmol/L 4.2  3.9  4.2   Chloride 98 - 111 mmol/L 102     CO2 22 - 32 mmol/L 26     Calcium 8.9 - 10.3 mg/dL 9.7     Total Protein 6.5 - 8.1 g/dL 7.0     Total Bilirubin 0.3 - 1.2 mg/dL 0.3     Alkaline Phos 38 - 126 U/L 67     AST 15 - 41 U/L 12     ALT 0 - 44 U/L 7         RADIOGRAPHIC STUDIES: I have personally reviewed the radiological images as listed and agreed with the findings in the report. No results found.   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 EGD showed duodenitis and gastritis, colonoscopy showed a large 15 cm partially obstructing mass in the ascending colon up to the hepatic flexure. Path confirmed poorly differentiated adenocarcinoma -staging work up showed prominent  but not pathologically enlarged local LNs, concerning for nodal involvement. There was indeterminate groud glass opacity in the right lung base but no distant mets.  -Baseline CEA was normal, 1.35 (08/05/21) -He underwent laparoscopic converted to open right colectomy by Dr. Annye English 09/14/21, he developed a postoperative ileus which recover, and received IV iron and 1 unit blood transfusion for anemia in the hospital, and was discharged home on postop day 8 -We reviewed his surgical  path which shows a large right colon cancer extending through the muscularis propria was completely removed, although lymph nodes were negative he had tumor deposits.  This was staged pT3,pN1c, stage IIIb.  We also discussed the molecular features which showed loss of MLH1 and PMS2, further testing shows BRAF mutation in MLH1 hyper methylation overall indicative of a sporadic cancer rather than a germline/Lynch syndrome associated cancer. -We discussed the high risk of recurrence in stage III cancer in the standard recommendation is for adjuvant chemo.  He understands the benefit is not guaranteed but is open to try -We discussed patients with right side MSI high disease do not respond as well to single agent Xeloda, therefore the standard of care is FOLFOX  -We also discussed the option of close surveillance and the use of guardant reveal testing to detect circulating tumor DNA.  Given his MSI high disease, if he does have recurrence he would be a candidate for immunotherapy (pembrolizumab) -We reviewed his case in tumor board, the question was raised as to a possible positive right kidney and involvement of the second portion of the duodenum.  This was not resected during surgery as it would have complicated his recovery.  He may be a candidate for adjuvant radiation. I sent a message to Dr. Lisbeth Renshaw -Mr. Nieman appears well.  He is recovering from surgery, appetite has returned.  He is gaining weight gradually. Bowels moving,  pain is well managed.  His activity level and performance status are adequate. -Labs reviewed, mild to moderate anemia has improved since surgery, Hgb 9.6. otherwise CBC and CMP are unremakrable -Patient seen with Dr. Burr Medico who recommends dose reduced FOLFOX q2 weeks x3 months.  --Chemotherapy consent: Side effects including but not limited to fatigue, altered taste, nausea, vomiting, constipation or diarrhea, hair thinning, cold sensitivity and neuropathy, fluid retention, liver and/or kidney dysfunction, neutropenic fever, need for blood transfusion, bleeding, were discussed with patient in great detail. He and his children agree to proceed.  The treatment goal of adjuvant chemo is curative. -We will request Dr. Dema Severin to place a port.  He will obtain chemo class prior to the first cycle -Plan to see him back with day 1 in 2-3 weeks to start chemo after he has recovered a more.  We will also see him 1 week after first cycle for toxicity check   Right upper quadrant pain, diarrhea, low appetite, weight loss -Secondary to #1 -Symptoms began approximately 6 months prior to diagnosis -RUQ pain was intermittent initially but constant now, mild unless he overeats.  -diarrhea usually 2/day. He knows to take imodium if this increases. He appears hydrated.  -Pain resolved after surgery.  Appetite has much improved and he is gaining weight slowly.  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 C/w mild IDA  -he began oral B12 and oral iron in 05/2021.  -In 06/24/21 hgb remained stable at 11, but serum iron and saturation decreased to 27 and 8.3%, respectively. Ferritin 38. He was increased to 2 tabs but did not  tolerate, and reduced back to 1 tab -Today's repeat CBC and iron studies show hgb down to 9.1, serum iron 19, 7% saturation, and TIBC 288 which is  normal. Ferritin is pending.  -He received IV Feraheme and 1 unit of blood while hospitalized for surgery -Anemia remains mild to moderate, we will give Feraheme x2 more doses with cycles 1 and 2 of FOLFOX   Hypothyroidism, HTN, pre-DM, GERD, CKD -on lisinopril and synthroid. Does not take many medications -SCr up to 2.2 in 2019 but has normalized since then -per PCP    Social  -Mr. Alcocer is the primary care giver for his wife who has Parkinson's. They live in a 2 story home and have a farm house that is ADA approved -pt is independent with ADLs.  -he has 2 adult children who live locally and have been strong support system during this time   6. goals of care  -He understands the goal of adjuvant therapy is curative, to reduce recurrence risk.    Plan: -Reviewed hospital course, surgical path, tumor board discussion, and today's labs -Recommend adjuvant chemo with dose reduced FOLFOX q2 weeks x3 months starting in 2-3 weeks after he recovers more.  We will ask Dr. Dema Severin to place a port -Chemo class prior to first cycle -Follow-up and cycle 1 in 2-3 weeks, then toxicity check 1 week later -CC note to Dr. Dema Severin and Dr. Lisbeth Renshaw -Patient seen with Dr. Burr Medico    All questions were answered. The patient knows to call the clinic with any problems, questions or concerns. No barriers to learning were detected.     Alla Feeling, NP 10/08/21    Addendum  I have seen the patient, examined him. I agree with the assessment and and plan and have edited the notes.   Mr. Narang underwent right hemicolectomy.  I reviewed his surgical pathology findings.  His case was previously discussed in our GI conference, especially the intraoperative finding and surgical pathology results.  Apparently he had positive margin due to bowel perforation. Tumor was high  grade, with tumor deposit. His risk of recurrence, especially peritoneal recurrence is very high.  MMR showed loss of MLH1 and PMS2 expression, with positive MLH1 hyper methylation and BRAF mutation, supporting sporadic MSI high colon cancer.  He is extremely high risk of recurrence, I discussed the benefit of adjuvant chemotherapy, especially FOLFOX, due to the lack of significant benefit of capecitabine alone in MSI high colon cancer.  He is 52, but functions very well, with normal liver function, he probably can still tolerate dose reduced FOLFOX.  We also discussed the option of observation, and treat his recurrent colon cancer with Keytruda.  After lengthy discussion, patient wishes to try chemo FOLFOX.  Consent was obtained today.  Plan to start in about 2 weeks. Will arrange port placement, chemo class.   Truitt Merle  10/08/2021

## 2021-10-08 ENCOUNTER — Encounter: Payer: Self-pay | Admitting: Nurse Practitioner

## 2021-10-08 ENCOUNTER — Other Ambulatory Visit: Payer: Self-pay

## 2021-10-08 ENCOUNTER — Inpatient Hospital Stay (HOSPITAL_BASED_OUTPATIENT_CLINIC_OR_DEPARTMENT_OTHER): Payer: Medicare Other | Admitting: Nurse Practitioner

## 2021-10-08 ENCOUNTER — Inpatient Hospital Stay: Payer: Medicare Other | Attending: Nurse Practitioner

## 2021-10-08 VITALS — BP 117/73 | HR 73 | Temp 97.8°F | Resp 17 | Ht 73.0 in | Wt 196.8 lb

## 2021-10-08 DIAGNOSIS — K219 Gastro-esophageal reflux disease without esophagitis: Secondary | ICD-10-CM | POA: Insufficient documentation

## 2021-10-08 DIAGNOSIS — E039 Hypothyroidism, unspecified: Secondary | ICD-10-CM | POA: Diagnosis not present

## 2021-10-08 DIAGNOSIS — D649 Anemia, unspecified: Secondary | ICD-10-CM | POA: Diagnosis not present

## 2021-10-08 DIAGNOSIS — C182 Malignant neoplasm of ascending colon: Secondary | ICD-10-CM | POA: Insufficient documentation

## 2021-10-08 DIAGNOSIS — N189 Chronic kidney disease, unspecified: Secondary | ICD-10-CM | POA: Diagnosis not present

## 2021-10-08 DIAGNOSIS — Z79899 Other long term (current) drug therapy: Secondary | ICD-10-CM | POA: Diagnosis not present

## 2021-10-08 DIAGNOSIS — I129 Hypertensive chronic kidney disease with stage 1 through stage 4 chronic kidney disease, or unspecified chronic kidney disease: Secondary | ICD-10-CM | POA: Insufficient documentation

## 2021-10-08 LAB — CMP (CANCER CENTER ONLY)
ALT: 7 U/L (ref 0–44)
AST: 12 U/L — ABNORMAL LOW (ref 15–41)
Albumin: 3.7 g/dL (ref 3.5–5.0)
Alkaline Phosphatase: 67 U/L (ref 38–126)
Anion gap: 7 (ref 5–15)
BUN: 15 mg/dL (ref 8–23)
CO2: 26 mmol/L (ref 22–32)
Calcium: 9.7 mg/dL (ref 8.9–10.3)
Chloride: 102 mmol/L (ref 98–111)
Creatinine: 1.14 mg/dL (ref 0.61–1.24)
GFR, Estimated: >60 mL/min (ref >60)
Glucose, Bld: 124 mg/dL — ABNORMAL HIGH (ref 70–99)
Potassium: 4.2 mmol/L (ref 3.5–5.1)
Sodium: 135 mmol/L (ref 135–145)
Total Bilirubin: 0.3 mg/dL (ref 0.3–1.2)
Total Protein: 7 g/dL (ref 6.5–8.1)

## 2021-10-08 LAB — CBC WITH DIFFERENTIAL (CANCER CENTER ONLY)
Abs Immature Granulocytes: 0 10*3/uL (ref 0.00–0.07)
Basophils Absolute: 0 10*3/uL (ref 0.0–0.1)
Basophils Relative: 0 %
Eosinophils Absolute: 0.1 10*3/uL (ref 0.0–0.5)
Eosinophils Relative: 1 %
HCT: 30.4 % — ABNORMAL LOW (ref 39.0–52.0)
Hemoglobin: 9.6 g/dL — ABNORMAL LOW (ref 13.0–17.0)
Immature Granulocytes: 0 %
Lymphocytes Relative: 20 %
Lymphs Abs: 0.9 10*3/uL (ref 0.7–4.0)
MCH: 26.6 pg (ref 26.0–34.0)
MCHC: 31.6 g/dL (ref 30.0–36.0)
MCV: 84.2 fL (ref 80.0–100.0)
Monocytes Absolute: 0.4 10*3/uL (ref 0.1–1.0)
Monocytes Relative: 9 %
Neutro Abs: 3.2 10*3/uL (ref 1.7–7.7)
Neutrophils Relative %: 70 %
Platelet Count: 236 10*3/uL (ref 150–400)
RBC: 3.61 MIL/uL — ABNORMAL LOW (ref 4.22–5.81)
RDW: 17.5 % — ABNORMAL HIGH (ref 11.5–15.5)
WBC Count: 4.6 10*3/uL (ref 4.0–10.5)
nRBC: 0 % (ref 0.0–0.2)

## 2021-10-08 MED ORDER — ONDANSETRON HCL 8 MG PO TABS: 8.0000 mg | ORAL_TABLET | Freq: Two times a day (BID) | ORAL | 1 refills | Status: DC | PRN

## 2021-10-08 MED ORDER — PROCHLORPERAZINE MALEATE 10 MG PO TABS: 10.0000 mg | ORAL_TABLET | Freq: Four times a day (QID) | ORAL | 1 refills | Status: DC | PRN

## 2021-10-08 MED ORDER — LIDOCAINE-PRILOCAINE 2.5-2.5 % EX CREA: TOPICAL_CREAM | CUTANEOUS | 3 refills | Status: DC

## 2021-10-08 NOTE — Progress Notes (Signed)
I met with Joseph Pennington and his children during  his follow up with Cira Rue, NP-C and Dr Burr Medico.  I briefly explained insertion and care of a port a cath.  I showed a sample of the port a cath. I told them we will reach out to Dr Dema Severin to see if he can place the port a cath, if not we will arrange for IR to place the port.  I told them that he will be scheduled for chemotherapy education class prior to receiving chemotherapy. I encouraged him to bring someone with him to that class. I told them our schedulers will call him with those appts.  All questions were answered. They verbalized understanding.

## 2021-10-08 NOTE — Progress Notes (Signed)
START ON PATHWAY REGIMEN - Colorectal     A cycle is every 14 days:     Oxaliplatin      Leucovorin      Fluorouracil      Fluorouracil   **Always confirm dose/schedule in your pharmacy ordering system**  Patient Characteristics: Postoperative without Neoadjuvant Therapy, M0 (Pathologic Staging), Colon, Stage III, Low Risk (pT1-3, pN1) Tumor Location: Colon Therapeutic Status: Postoperative without Neoadjuvant Therapy, M0 (Pathologic Staging) AJCC M Category: cM0 AJCC T Category: pT3 AJCC N Category: pN1c AJCC 8 Stage Grouping: IIIB Intent of Therapy: Curative Intent, Discussed with Patient 

## 2021-10-09 ENCOUNTER — Telehealth: Payer: Self-pay | Admitting: Hematology

## 2021-10-09 NOTE — Telephone Encounter (Signed)
Left message with follow-up appointments per 7/20 los.

## 2021-10-12 ENCOUNTER — Other Ambulatory Visit: Payer: Self-pay

## 2021-10-13 ENCOUNTER — Telehealth: Payer: Self-pay

## 2021-10-13 NOTE — Telephone Encounter (Signed)
This nurse received a call from patients daughter, Santiago Glad, who requested to know when the patients port placement will be scheduled.  This nurse advised that the navigator reached out to Dr Dema Severin and staff at [facility] this morning and they are supposed to schedule the port placement and notify them.  Daughter states that she will reach out to them in the morning to see if the appointment has been made.  No further questions or concerns at this time.

## 2021-10-14 ENCOUNTER — Encounter (HOSPITAL_COMMUNITY): Payer: Self-pay | Admitting: Surgery

## 2021-10-14 ENCOUNTER — Other Ambulatory Visit: Payer: Self-pay

## 2021-10-14 ENCOUNTER — Inpatient Hospital Stay: Payer: Medicare Other

## 2021-10-14 NOTE — Progress Notes (Signed)
For Short Stay: [place] appointment date: N/A Date of COVID positive in last 90 days: N/A  Bowel Prep reminder: N/A   For Anesthesia: PCP - Shirline Frees, MD Cardiologist - N/A Oncologist-Feng, Krista Blue, MD  last office visit note 10/08/21  Chest x-ray - 09/13/21 in epic  EKG - 09/03/21 in epic Stress Test - N/A ECHO - N/A Cardiac Cath - N/A Pacemaker/ICD device last checked: N/A Pacemaker orders received: N/A Device Rep notified: N/A  Spinal Cord Stimulator:N/A  Sleep Study - N/A CPAP - N/A  Fasting Blood Sugar - 125 Checks Blood Sugar __1___ times a week Date and result of last Hgb A1c- 09/20/21 6.5 in epic  Blood Thinner Instructions: N/A Aspirin Instructions: N/A Last Dose: N/A  Activity level:  Able to exercise without chest pain and/or shortness of breath    Anesthesia review: N/A  Patient denies shortness of breath, fever, cough and chest pain at PAT appointment   Patient verbalized understanding of instructions that were given to them at the PAT appointment. Patient was also instructed that they will need to review over the PAT instructions again at home before surgery.

## 2021-10-15 ENCOUNTER — Encounter (HOSPITAL_COMMUNITY)
Admission: RE | Disposition: A | Discharge status: Home / Self Care | Payer: Self-pay | Source: Home / Self Care | Attending: Surgery

## 2021-10-15 ENCOUNTER — Ambulatory Visit (HOSPITAL_COMMUNITY): Payer: Medicare Other | Admitting: Anesthesiology

## 2021-10-15 ENCOUNTER — Ambulatory Visit (HOSPITAL_COMMUNITY): Payer: Medicare Other

## 2021-10-15 ENCOUNTER — Ambulatory Visit (HOSPITAL_COMMUNITY)
Admission: RE | Admit: 2021-10-15 | Discharge: 2021-10-15 | Disposition: A | Discharge status: Home / Self Care | Payer: Medicare Other | Attending: Surgery | Admitting: Surgery

## 2021-10-15 ENCOUNTER — Ambulatory Visit (HOSPITAL_BASED_OUTPATIENT_CLINIC_OR_DEPARTMENT_OTHER): Payer: Medicare Other | Admitting: Anesthesiology

## 2021-10-15 ENCOUNTER — Encounter (HOSPITAL_COMMUNITY): Payer: Self-pay | Admitting: Surgery

## 2021-10-15 ENCOUNTER — Other Ambulatory Visit: Payer: Self-pay

## 2021-10-15 DIAGNOSIS — D630 Anemia in neoplastic disease: Secondary | ICD-10-CM

## 2021-10-15 DIAGNOSIS — Z87891 Personal history of nicotine dependence: Secondary | ICD-10-CM | POA: Insufficient documentation

## 2021-10-15 DIAGNOSIS — N189 Chronic kidney disease, unspecified: Secondary | ICD-10-CM | POA: Diagnosis not present

## 2021-10-15 DIAGNOSIS — E1122 Type 2 diabetes mellitus with diabetic chronic kidney disease: Secondary | ICD-10-CM | POA: Diagnosis not present

## 2021-10-15 DIAGNOSIS — C189 Malignant neoplasm of colon, unspecified: Secondary | ICD-10-CM | POA: Insufficient documentation

## 2021-10-15 DIAGNOSIS — I129 Hypertensive chronic kidney disease with stage 1 through stage 4 chronic kidney disease, or unspecified chronic kidney disease: Secondary | ICD-10-CM | POA: Insufficient documentation

## 2021-10-15 DIAGNOSIS — Z79899 Other long term (current) drug therapy: Secondary | ICD-10-CM | POA: Diagnosis not present

## 2021-10-15 DIAGNOSIS — I10 Essential (primary) hypertension: Secondary | ICD-10-CM

## 2021-10-15 DIAGNOSIS — C182 Malignant neoplasm of ascending colon: Secondary | ICD-10-CM | POA: Diagnosis not present

## 2021-10-15 DIAGNOSIS — Z5111 Encounter for antineoplastic chemotherapy: Secondary | ICD-10-CM | POA: Diagnosis not present

## 2021-10-15 DIAGNOSIS — Z452 Encounter for adjustment and management of vascular access device: Secondary | ICD-10-CM

## 2021-10-15 DIAGNOSIS — E119 Type 2 diabetes mellitus without complications: Secondary | ICD-10-CM

## 2021-10-15 HISTORY — DX: Iron deficiency anemia, unspecified: D50.9

## 2021-10-15 HISTORY — DX: Malignant neoplasm of colon, unspecified: C18.9

## 2021-10-15 HISTORY — DX: Type 2 diabetes mellitus without complications: E11.9

## 2021-10-15 HISTORY — DX: Hypothyroidism, unspecified: E03.9

## 2021-10-15 HISTORY — DX: Personal history of other diseases of the digestive system: Z87.19

## 2021-10-15 HISTORY — DX: Polyneuropathy, unspecified: G62.9

## 2021-10-15 HISTORY — PX: PORTACATH PLACEMENT: SHX2246

## 2021-10-15 LAB — GLUCOSE, CAPILLARY
Glucose-Capillary: 120 mg/dL — ABNORMAL HIGH (ref 70–99)
Glucose-Capillary: 143 mg/dL — ABNORMAL HIGH (ref 70–99)

## 2021-10-15 SURGERY — INSERTION, TUNNELED CENTRAL VENOUS DEVICE, WITH PORT
Anesthesia: General

## 2021-10-15 MED ORDER — ONDANSETRON HCL 4 MG/2ML IJ SOLN
INTRAMUSCULAR | Status: DC | PRN
  Administered 2021-10-15: 4 mg via INTRAVENOUS

## 2021-10-15 MED ORDER — FENTANYL CITRATE (PF) 100 MCG/2ML IJ SOLN
INTRAMUSCULAR | Status: AC
  Filled 2021-10-15: qty 2

## 2021-10-15 MED ORDER — LIDOCAINE HCL (PF) 2 % IJ SOLN
INTRAMUSCULAR | Status: AC
  Filled 2021-10-15: qty 5

## 2021-10-15 MED ORDER — CHLORHEXIDINE GLUCONATE CLOTH 2 % EX PADS: 6.0000 | MEDICATED_PAD | Freq: Once | CUTANEOUS | Status: DC

## 2021-10-15 MED ORDER — ONDANSETRON HCL 4 MG/2ML IJ SOLN
INTRAMUSCULAR | Status: AC
  Filled 2021-10-15: qty 2

## 2021-10-15 MED ORDER — CEFAZOLIN SODIUM-DEXTROSE 2-4 GM/100ML-% IV SOLN
2.0000 g | INTRAVENOUS | Status: AC
  Administered 2021-10-15: 2 g via INTRAVENOUS
  Filled 2021-10-15: qty 100

## 2021-10-15 MED ORDER — IOHEXOL 300 MG/ML  SOLN
INTRAMUSCULAR | Status: DC | PRN
  Administered 2021-10-15: 3 mL

## 2021-10-15 MED ORDER — MEPERIDINE HCL 50 MG/ML IJ SOLN: 6.2500 mg | INTRAMUSCULAR | Status: DC | PRN

## 2021-10-15 MED ORDER — FENTANYL CITRATE PF 50 MCG/ML IJ SOSY: 25.0000 ug | PREFILLED_SYRINGE | INTRAMUSCULAR | Status: DC | PRN

## 2021-10-15 MED ORDER — PROPOFOL 10 MG/ML IV BOLUS
INTRAVENOUS | Status: AC
  Filled 2021-10-15: qty 20

## 2021-10-15 MED ORDER — CHLORHEXIDINE GLUCONATE 0.12 % MT SOLN
15.0000 mL | Freq: Once | OROMUCOSAL | Status: AC
  Administered 2021-10-15: 15 mL via OROMUCOSAL

## 2021-10-15 MED ORDER — LIDOCAINE HCL (CARDIAC) PF 100 MG/5ML IV SOSY
PREFILLED_SYRINGE | INTRAVENOUS | Status: DC | PRN
  Administered 2021-10-15: 100 mg via INTRAVENOUS

## 2021-10-15 MED ORDER — HEPARIN SOD (PORK) LOCK FLUSH 100 UNIT/ML IV SOLN
INTRAVENOUS | Status: DC | PRN
  Administered 2021-10-15: 500 [IU]

## 2021-10-15 MED ORDER — DEXAMETHASONE SODIUM PHOSPHATE 10 MG/ML IJ SOLN
INTRAMUSCULAR | Status: AC
  Filled 2021-10-15: qty 1

## 2021-10-15 MED ORDER — LACTATED RINGERS IV SOLN: INTRAVENOUS | Status: DC

## 2021-10-15 MED ORDER — ONDANSETRON HCL 4 MG/2ML IJ SOLN: 4.0000 mg | Freq: Once | INTRAMUSCULAR | Status: DC | PRN

## 2021-10-15 MED ORDER — ACETAMINOPHEN 160 MG/5ML PO SOLN: 325.0000 mg | ORAL | Status: DC | PRN

## 2021-10-15 MED ORDER — EPHEDRINE 5 MG/ML INJ
INTRAVENOUS | Status: AC
Start: 2021-10-15 — End: ?
  Filled 2021-10-15: qty 5

## 2021-10-15 MED ORDER — LIDOCAINE-EPINEPHRINE 1 %-1:100000 IJ SOLN
INTRAMUSCULAR | Status: DC | PRN
  Administered 2021-10-15: 10 mL

## 2021-10-15 MED ORDER — OXYCODONE HCL 5 MG PO TABS: 5.0000 mg | ORAL_TABLET | Freq: Once | ORAL | Status: DC | PRN

## 2021-10-15 MED ORDER — PROPOFOL 10 MG/ML IV BOLUS
INTRAVENOUS | Status: DC | PRN
  Administered 2021-10-15: 140 mg via INTRAVENOUS

## 2021-10-15 MED ORDER — HEPARIN SOD (PORK) LOCK FLUSH 100 UNIT/ML IV SOLN
INTRAVENOUS | Status: AC
  Filled 2021-10-15: qty 5

## 2021-10-15 MED ORDER — DEXAMETHASONE SODIUM PHOSPHATE 10 MG/ML IJ SOLN
INTRAMUSCULAR | Status: DC | PRN
  Administered 2021-10-15: 10 mg via INTRAVENOUS

## 2021-10-15 MED ORDER — TRAMADOL HCL 50 MG PO TABS
50.0000 mg | ORAL_TABLET | Freq: Four times a day (QID) | ORAL | 0 refills | Status: DC | PRN
Start: 2021-10-15 — End: 2022-01-07

## 2021-10-15 MED ORDER — HEPARIN 6000 UNIT IRRIGATION SOLUTION
Freq: Once | Status: AC
  Administered 2021-10-15: 1
  Filled 2021-10-15: qty 6000

## 2021-10-15 MED ORDER — PHENYLEPHRINE 80 MCG/ML (10ML) SYRINGE FOR IV PUSH (FOR BLOOD PRESSURE SUPPORT)
PREFILLED_SYRINGE | INTRAVENOUS | Status: DC | PRN
  Administered 2021-10-15 (×5): 80 ug via INTRAVENOUS

## 2021-10-15 MED ORDER — OXYCODONE HCL 5 MG/5ML PO SOLN: 5.0000 mg | Freq: Once | ORAL | Status: DC | PRN

## 2021-10-15 MED ORDER — LIDOCAINE-EPINEPHRINE 1 %-1:100000 IJ SOLN
INTRAMUSCULAR | Status: AC
  Filled 2021-10-15: qty 1

## 2021-10-15 MED ORDER — FENTANYL CITRATE (PF) 100 MCG/2ML IJ SOLN
INTRAMUSCULAR | Status: DC | PRN
Start: 2021-10-15 — End: 2021-10-15
  Administered 2021-10-15: 50 ug via INTRAVENOUS

## 2021-10-15 MED ORDER — ORAL CARE MOUTH RINSE: 15.0000 mL | Freq: Once | OROMUCOSAL | Status: AC

## 2021-10-15 MED ORDER — ACETAMINOPHEN 325 MG PO TABS: 325.0000 mg | ORAL_TABLET | ORAL | Status: DC | PRN

## 2021-10-15 SURGICAL SUPPLY — 50 items
ADH SKN CLS APL DERMABOND .7 (GAUZE/BANDAGES/DRESSINGS) ×1
APL PRP STRL LF DISP 70% ISPRP (MISCELLANEOUS) ×1
APL SKNCLS STERI-STRIP NONHPOA (GAUZE/BANDAGES/DRESSINGS)
BAG COUNTER SPONGE SURGICOUNT (BAG) IMPLANT
BAG DECANTER FOR FLEXI CONT (MISCELLANEOUS) ×2 IMPLANT
BAG SPNG CNTER NS LX DISP (BAG)
BENZOIN TINCTURE PRP APPL 2/3 (GAUZE/BANDAGES/DRESSINGS) ×1 IMPLANT
BLADE HEX COATED 2.75 (ELECTRODE) ×1 IMPLANT
BLADE SURG 15 STRL LF DISP TIS (BLADE) ×1 IMPLANT
BLADE SURG 15 STRL SS (BLADE) ×2
BLADE SURG SZ11 CARB STEEL (BLADE) ×2 IMPLANT
CHLORAPREP W/TINT 26 (MISCELLANEOUS) ×2 IMPLANT
COVER PROBE U/S 5X48 (MISCELLANEOUS) ×2 IMPLANT
DERMABOND ADVANCED (GAUZE/BANDAGES/DRESSINGS) ×1
DERMABOND ADVANCED .7 DNX12 (GAUZE/BANDAGES/DRESSINGS) ×1 IMPLANT
DRAPE C-ARM 42X120 X-RAY (DRAPES) ×2 IMPLANT
DRAPE LAPAROSCOPIC ABDOMINAL (DRAPES) ×2 IMPLANT
DRSG TEGADERM 2-3/8X2-3/4 SM (GAUZE/BANDAGES/DRESSINGS) ×1 IMPLANT
DRSG TEGADERM 4X4.75 (GAUZE/BANDAGES/DRESSINGS) ×2 IMPLANT
ELECT REM PT RETURN 15FT ADLT (MISCELLANEOUS) ×2 IMPLANT
GAUZE 4X4 16PLY ~~LOC~~+RFID DBL (SPONGE) ×2 IMPLANT
GAUZE SPONGE 4X4 12PLY STRL (GAUZE/BANDAGES/DRESSINGS) ×1 IMPLANT
GLOVE ECLIPSE 8.0 STRL XLNG CF (GLOVE) ×2 IMPLANT
GLOVE INDICATOR 8.0 STRL GRN (GLOVE) ×2 IMPLANT
GLOVE SURG LX 7.5 STRW (GLOVE) ×1
GLOVE SURG LX STRL 7.5 STRW (GLOVE) ×1 IMPLANT
GOWN STRL REUS W/ TWL XL LVL3 (GOWN DISPOSABLE) ×2 IMPLANT
GOWN STRL REUS W/TWL XL LVL3 (GOWN DISPOSABLE) ×4
KIT BASIN OR (CUSTOM PROCEDURE TRAY) ×2 IMPLANT
KIT PORT POWER 8FR ISP CVUE (Port) ×2 IMPLANT
KIT TURNOVER KIT A (KITS) ×1 IMPLANT
NDL HYPO 25X1 1.5 SAFETY (NEEDLE) ×1 IMPLANT
NEEDLE HYPO 25X1 1.5 SAFETY (NEEDLE) ×2 IMPLANT
NS IRRIG 1000ML POUR BTL (IV SOLUTION) ×2 IMPLANT
PACK BASIC VI WITH GOWN DISP (CUSTOM PROCEDURE TRAY) ×2 IMPLANT
PENCIL SMOKE EVACUATOR (MISCELLANEOUS) ×1 IMPLANT
SPIKE FLUID TRANSFER (MISCELLANEOUS) ×1 IMPLANT
STRIP CLOSURE SKIN 1/2X4 (GAUZE/BANDAGES/DRESSINGS) ×1 IMPLANT
SUT MNCRL AB 4-0 PS2 18 (SUTURE) ×2 IMPLANT
SUT PROLENE 2 0 SH DA (SUTURE) ×2 IMPLANT
SUT VIC AB 2-0 SH 18 (SUTURE) IMPLANT
SUT VIC AB 2-0 SH 27 (SUTURE)
SUT VIC AB 2-0 SH 27X BRD (SUTURE) IMPLANT
SUT VIC AB 3-0 SH 27 (SUTURE) ×2
SUT VIC AB 3-0 SH 27XBRD (SUTURE) ×1 IMPLANT
SYR 10ML LL (SYRINGE) ×2 IMPLANT
SYR 20ML LL LF (SYRINGE) ×2 IMPLANT
SYR CONTROL 10ML LL (SYRINGE) ×2 IMPLANT
TOWEL OR 17X26 10 PK STRL BLUE (TOWEL DISPOSABLE) ×2 IMPLANT
TOWEL OR NON WOVEN STRL DISP B (DISPOSABLE) ×2 IMPLANT

## 2021-10-15 NOTE — Progress Notes (Signed)
Pharmacist Chemotherapy Monitoring - Initial Assessment    Anticipated start date: 10/22/21   The following has been reviewed per standard work regarding the patient's treatment regimen: The patient's diagnosis, treatment plan and drug doses, and organ/hematologic function Lab orders and baseline tests specific to treatment regimen  The treatment plan start date, drug sequencing, and pre-medications Prior authorization status  Patient's documented medication list, including drug-drug interaction screen and prescriptions for anti-emetics and supportive care specific to the treatment regimen The drug concentrations, fluid compatibility, administration routes, and timing of the medications to be used The patient's access for treatment and lifetime cumulative dose history, if applicable  The patient's medication allergies and previous infusion related reactions, if applicable   Changes made to treatment plan:  N/A  Follow up needed:  N/A Feraheme with cycles 1 and 2 of chemo   Joseph Pennington, RPH, BCPS, BCOP 10/15/2021  2:18 PM

## 2021-10-15 NOTE — Anesthesia Postprocedure Evaluation (Signed)
Anesthesia Post Note  Patient: Joseph Pennington  Procedure(s) Performed: INSERTION PORT-A-CATH     Patient location during evaluation: PACU Anesthesia Type: General Level of consciousness: awake and alert Pain management: pain level controlled Vital Signs Assessment: post-procedure vital signs reviewed and stable Respiratory status: spontaneous breathing, nonlabored ventilation, respiratory function stable and patient connected to nasal cannula oxygen Cardiovascular status: blood pressure returned to baseline and stable Postop Assessment: no apparent nausea or vomiting Anesthetic complications: no   No notable events documented.  Last Vitals:  Vitals:   10/15/21 1045 10/15/21 1100  BP: 122/71 122/73  Pulse: 66 71  Resp: 14 15  Temp: 36.4 C   SpO2: 96% 97%    Last Pain:  Vitals:   10/15/21 1100  TempSrc:   PainSc: 0-No pain                 Mckinzee Spirito

## 2021-10-15 NOTE — Anesthesia Procedure Notes (Signed)
Procedure Name: LMA Insertion Date/Time: 10/15/2021 9:07 AM  Performed by: Lind Covert, CRNAPre-anesthesia Checklist: Patient identified, Emergency Drugs available, Suction available, Patient being monitored and Timeout performed Patient Re-evaluated:Patient Re-evaluated prior to induction Oxygen Delivery Method: Circle system utilized Preoxygenation: Pre-oxygenation with 100% oxygen Induction Type: IV induction LMA: LMA inserted LMA Size: 5.0 Tube type: Oral Placement Confirmation: positive ETCO2 and breath sounds checked- equal and bilateral Tube secured with: Tape Dental Injury: Teeth and Oropharynx as per pre-operative assessment

## 2021-10-15 NOTE — Discharge Instructions (Addendum)
POST OP INSTRUCTIONS  DIET: As tolerated. Follow a light bland diet the first 24 hours after arrival home, such as soup, liquids, crackers, etc.  Be sure to include lots of fluids daily.  Avoid fast food or heavy meals as your are more likely to get nauseated.  Eat a low fat the next few days after surgery.  Take your usually prescribed home medications unless otherwise directed.  PAIN CONTROL: Pain is best controlled by a usual combination of three different methods TOGETHER: Ice/Heat Over the counter pain medication Prescription pain medication Most patients will experience some swelling and bruising around the surgical site.  Ice packs or heating pads (30-60 minutes up to 6 times a day) will help. Some people prefer to use ice alone, heat alone, alternating between ice & heat.  Experiment to what works for you.  Swelling and bruising can take several weeks to resolve.   It is helpful to take an over-the-counter pain medication regularly for the first few weeks: Ibuprofen (Motrin/Advil) - 200mg tabs - take 3 tabs (600mg) every 6 hours as needed for pain Acetaminophen (Tylenol) - you may take 650mg every 6 hours as needed. You can take this with motrin as they act differently on the body. If you are taking a narcotic pain medication that has acetaminophen in it, do not take over the counter tylenol at the same time.  Iii. NOTE: You may take both of these medications together - most patients  find it most helpful when alternating between the two (i.e. Ibuprofen at 6am,  tylenol at 9am, ibuprofen at 12pm ...) A  prescription for pain medication should be given to you upon discharge.  Take your pain medication as prescribed if your pain is not adequatly controlled with the over-the-counter pain reliefs mentioned above.  Avoid getting constipated.  Between the surgery and the pain medications, it is common to experience some constipation.  Increasing fluid intake and taking a fiber supplement (such as  Metamucil, Citrucel, FiberCon, MiraLax, etc) 1-2 times a day regularly will usually help prevent this problem from occurring.  A mild laxative (prune juice, Milk of Magnesia, MiraLax, etc) should be taken according to package directions if there are no bowel movements after 48 hours.    Dressing: Your incisions are covered in Dermabond which is like sterile superglue for the skin. This will come off on it's own in a couple weeks. It is waterproof and you may bathe normally starting the day after your surgery in a shower. Avoid baths/pools/lakes/oceans until your wounds have fully healed.  ACTIVITIES as tolerated:   Avoid heavy lifting (>10lbs or 1 gallon of milk) for the next 6 weeks. You may resume regular (light) daily activities beginning the next day--such as daily self-care, walking, climbing stairs--gradually increasing activities as tolerated.  If you can walk 30 minutes without difficulty, it is safe to try more intense activity such as jogging, treadmill, bicycling, low-impact aerobics.  DO NOT PUSH THROUGH PAIN.  Let pain be your guide: If it hurts to do something, don't do it. You may drive when you are no longer taking prescription pain medication, you can comfortably wear a seatbelt, and you can safely maneuver your car and apply brakes.   FOLLOW UP in our office Please call [facility] at [phone] to set up an appointment to see your surgeon in the office for a follow-up appointment approximately 2 weeks after your surgery. Make sure that you call for this appointment the day you arrive home to   insure a convenient appointment time.  9. If you have disability or family leave forms that need to be completed, you may have them completed by your primary care physician's office; for return to work instructions, please ask our office staff and they will be happy to assist you in obtaining this documentation   When to call us [phone]: Poor pain control Reactions / problems with new  medications (rash/itching, etc)  Fever over 101.5 F (38.5 C) Inability to urinate Nausea/vomiting Worsening swelling or bruising Continued bleeding from incision. Increased pain, redness, or drainage from the incision  The clinic staff is available to answer your questions during regular business hours (8:30am-5pm).  Please don't hesitate to call and ask to speak to one of our nurses for clinical concerns.   A surgeon from [facility] is always on call at the hospitals   If you have a medical emergency, go to the nearest emergency room or call 911.  [facility] [address], [address], [place], [place]  [address] MAIN: [phone] FAX: [phone] [email or web address]  

## 2021-10-15 NOTE — Op Note (Addendum)
10/15/2021  10:15 AM  PATIENT:  Joseph Pennington  88 y.o. male  Patient Care Team: Shirline Frees, MD as PCP - General (Family Medicine)  PRE-OPERATIVE DIAGNOSIS:  Colon cancer, plans for systemic chemotherapy  POST-OPERATIVE DIAGNOSIS:  Same  PROCEDURE: Placement of right internal jugular vein Port-A-Cath with fluoroscopic guidance Ultrasound-guided access of right internal jugular vein (see photo below)  SURGEON:  Sharon Mt. Dewell Monnier, MD  ANESTHESIA:   local and general  COUNTS:  Sponge, needle and instrument counts were reported correct x2 at the conclusion of the operation.  EBL: 2 mL  DRAINS: None  SPECIMEN: None  COMPLICATIONS: None  FINDINGS: Right internal jugular vein accessed on the first attempt under direct ultrasound guidance.  Portacatheter placed with fluoroscopy.  At conclusion, tip of the catheter rests at the cavoatrial junction. No arrhythmia or PVCs during procedure or at completion   [place]  Right internal jugular vein access  10/15/2021 10:21  Attached To:  Hospital Encounter on 10/15/21   Source Information  Ileana Roup, MD  [facility]    DISPOSITION: PACU in satisfactory condition   DESCRIPTION: The patient was identified in preop holding and taken to the OR where he was placed on the operating room table. SCDs were placed. General anesthesia was induced without difficulty.  The head is tilted to the left and supported with a pillow.  He was then prepped and draped in the usual sterile fashion. A surgical timeout was performed indicating the correct patient, procedure, positioning and need for preoperative antibiotics.   An ultrasound is then used to survey the right neck.  Standard venous anatomy is identified.  The bed is placed in a partial Trendelenburg.  The internal jugular vein lies anterior to the carotid artery.  Under direct ultrasound guidance, the access needle was introduced  into the internal jugular vein on the first attempt.  Using Seldinger technique, the wire was advanced.  The needle was removed.  Fluoroscopy was then utilized to confirm that the wire rests in the right hemithorax.  Under fluoroscopy, the peel-away sheath is then introduced over the wire.  The wire and dilator are then removed.  The port tubing is confirmed to flush nicely.  This is then introduced into the sheath.  This sheath is then cracked and peeled away.  The right anterior chest wall is the location of the planned Port-A-Cath.  A skin incision is created.  The subcutaneous tissues divided electrocautery.  The pectoralis fascia is identified.  The port pocket is then created.  Additional local anesthetic is infiltrated.  The tendon passer was then used as a tunneling device.  This is then passed to exit at the level of the right neck stab incision where the tubing is.  The tubing is then tunneled to the portacatheter pocket.  Dilute Omnipaque is then instilled into the tubing.  Under fluoroscopic guidance, the tubing is then retracted until the tip rests at the cavoatrial junction.  The portacatheter is then brought onto the field.  This is secured to the pectoralis fascia using 2-0 Prolene sutures.  These were left loose and not tied.  The tubing is then cut to size and using the tube locking device, placed onto the portacatheter.  The Prolene sutures were then tied, securing the portacatheter to the anterior chest wall.  A Huber needle was then used to access the port.  We confirmed that it both aspirates blood freely and injects normally.  The  catheter was then "locked" with heparin, 2 cc total.  Hemostasis is verified.  Deep dermal sutures of 2-0 Vicryl used at the portacatheter pocket site.  All sponge, needle, and instrument counts were reported correct.  The skin of all incisions closed with 4-0 Monocryl subcuticular sutures.  The wounds were washed, dried, and Dermabond applied.  He is then  awake from anesthesia, extubated, and transferred to a stretcher for transport to PACU in satisfactory condition.  A postprocedure chest x-ray has been ordered.

## 2021-10-15 NOTE — Anesthesia Preprocedure Evaluation (Addendum)
Anesthesia Evaluation  Patient identified by MRN, date of birth, ID band Patient awake    Reviewed: Allergy & Precautions, NPO status , Patient's Chart, lab work & pertinent test results  Airway Mallampati: I  TM Distance: >3 FB Neck ROM: Full    Dental no notable dental hx. (+) Teeth Intact, Dental Advisory Given, Caps   Pulmonary neg pulmonary ROS, former smoker,    Pulmonary exam normal breath sounds clear to auscultation       Cardiovascular hypertension, Pt. on medications Normal cardiovascular exam Rhythm:Regular Rate:Normal     Neuro/Psych negative neurological ROS  negative psych ROS   GI/Hepatic Neg liver ROS, GERD  ,  Endo/Other  diabetesHypothyroidism   Renal/GU negative Renal ROS  negative genitourinary   Musculoskeletal negative musculoskeletal ROS (+)   Abdominal   Peds negative pediatric ROS (+)  Hematology  (+) Blood dyscrasia, anemia ,   Anesthesia Other Findings   Reproductive/Obstetrics negative OB ROS                            Anesthesia Physical  Anesthesia Plan  ASA: 3  Anesthesia Plan: General   Post-op Pain Management: Minimal or no pain anticipated   Induction: Intravenous  PONV Risk Score and Plan: 2 and Ondansetron, Dexamethasone and Treatment may vary due to age or medical condition  Airway Management Planned: Oral ETT and LMA  Additional Equipment: None  Intra-op Plan:   Post-operative Plan: Extubation in OR  Informed Consent: I have reviewed the patients History and Physical, chart, labs and discussed the procedure including the risks, benefits and alternatives for the proposed anesthesia with the patient or authorized representative who has indicated his/her understanding and acceptance.     Dental advisory given  Plan Discussed with: CRNA and Anesthesiologist  Anesthesia Plan Comments:        Anesthesia Quick Evaluation

## 2021-10-15 NOTE — H&P (Signed)
CC: Here today for port  HPI: Joseph Pennington is an 88 y.o. male with history of HTN, CKD, DM (last A1c off tx 5.8), iron deficiency anemia, CAD, hypothyroidism, GERD, whom is seen in the office as a referral by Dr. Leonides Schanz for evaluation of newly diagnosed ascending/cecal colon cancer.  He underwent colonoscopy with Dr. Leonides Schanz 07/23/2021 which demonstrated: 1. Frond-like villous fungating and ulcerated partially obstructing mass in the ascending colon. Pinpoint opening that they were not able to pass the ultraslim scope through. Encompasses entire cecum. Mass is circumferential. Mass measured 15 cm in length. Biopsies were taken and area tattooed with spot. 2. 6 sessile polyps removed from the rectum and transverse colon. 3. Nonbleeding internal hemorrhoids.  Path-poorly differentiated adenocarcinoma  CT CAP 07/30/21 -  1. Masslike region in the ascending colon extending to the level of the hepatic flexure and right lower quadrant, concerning for malignancy. Associated pericolonic fat stranding and a few prominent lymph nodes. No distant metastasis seen. 2. Left inguinal hernia containing nonobstructed colon 3. Nonspecific groundglass opacity in right lower lobe. Attention on follow-up. 4. Small hiatal hernia. 5. Aortic atherosclerosis and coronary artery calcifications  He denies any complaints at this time. He does note over the last year he has had some intermittent right-sided abdominal pains. No nausea or vomiting. He has had an approximate 15 pound weight loss over the last 12 months. He recently had his Synthroid dose increased and had attributed his weight loss to this change. Denies any significant or evident blood in his stool.   Admitted overnight and given 1U PRBC. Feeling much better now - noticed sxs began following his flagyl dose and bowel prep - felt dehydrated and vomited. Denies abdominal pain, nausea/vomiting. No changes reported in health or health history otherwise since  we met in the office.   INTERVAL HX OR 09/14/21 lap --> open right hemicolectomy, repair of small bowel x2. Path showed. Poorly differentiated adenocarcinoma in the ascending colon with clear margins of resection. pT3 pN1c. Given grossly positive margin in OR (area where tumor involved right kidney and lateral border of duodenum) and N1c status, he was offered adjuvant therapy with medical oncology. They have requested port-a-cath placement   PMH: HTN, CKD, DM (last A1c off tx 5.8), iron deficiency anemia, CAD, hypothyroidism, GERD   PSH: Lap chole 2019 (Dr. Johna Sheriff)  FHx: His mother had sarcoma presumably of her rectum. Denies any known family history of colorectal, breast, endometrial or ovarian cancer  Social Hx: Denies use of tobacco/EtOH/illicit drug. 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 He is here today with his son whom is a Clinical research associate and has worked in Air traffic controller.  Past Medical History:  Diagnosis Date   Acute cholecystitis 02/19/2018   Anemia    Chronic kidney disease    Colon cancer (HCC)    Diabetes mellitus without complication (HCC)    diet controlled   GERD (gastroesophageal reflux disease)    History of blood transfusion 08/2021   History of hiatal hernia    Hypertension    Hypothyroidism    Iron deficiency anemia    Peripheral neuropathy    bilateral feet    Past Surgical History:  Procedure Laterality Date   CHOLECYSTECTOMY N/A 02/20/2018   Procedure: LAPAROSCOPIC CHOLECYSTECTOMY;  Surgeon: Glenna Fellows, MD;  Location: [facility];  Service: General;  Laterality: N/A;   COLONOSCOPY  2023   LAPAROSCOPIC RIGHT HEMI COLECTOMY Right 09/14/2021   Procedure: LAPAROSCOPIC TO OPEN  RIGHT HEMI COLECTOMY;  Surgeon: Ileana Roup, MD;  Location: [facility];  Service: General;  Laterality: Right;    Family History  Problem Relation Age of Onset   Rectal cancer Mother    Heart attack Father     Social:  reports that he quit  smoking about 43 years ago. His smoking use included cigarettes. He has a 20.00 pack-year smoking history. He has never used smokeless tobacco. He reports that he does not currently use alcohol. He reports that he does not currently use drugs.  Allergies: No Known Allergies  Medications: I have reviewed the patient's current medications.  No results found for this or any previous visit (from the past 48 hour(s)).  No results found.  ROS - all of the below systems have been reviewed with the patient and positives are indicated with bold text General: chills, fever or night sweats Eyes: blurry vision or double vision ENT: epistaxis or sore throat Allergy/Immunology: itchy/watery eyes or nasal congestion Hematologic/Lymphatic: bleeding problems, blood clots or swollen lymph nodes Endocrine: temperature intolerance or unexpected weight changes Breast: new or changing breast lumps or nipple discharge Resp: cough, shortness of breath, or wheezing CV: chest pain or dyspnea on exertion GI: as per HPI GU: dysuria, trouble voiding, or hematuria MSK: joint pain or joint stiffness Neuro: TIA or stroke symptoms Derm: pruritus and skin lesion changes Psych: anxiety and depression  PE Blood pressure 137/73, pulse 83, temperature 98 F (36.7 C), temperature source Oral, resp. rate 18, height $RemoveBe'6\' 1"'lzxIxoNmo$  (1.854 m), weight 88.5 kg, SpO2 100 %. Constitutional: NAD; conversant Eyes: Moist conjunctiva; no lid lag Lungs: Normal respiratory effort CV: RRR GI: Abd soft, NT/ND; no palpable hepatosplenomegaly MSK: Normal range of motion of extremities; no clubbing/cyanosis Psychiatric: Appropriate affect; alert and oriented x3  No results found for this or any previous visit (from the past 48 hour(s)).  No results found.  A/P: Joseph Pennington is an 88 y.o. male with locally advanced colon cancer, now here for port-a-cath for systemic therapy  -The anatomy and physiology of the central vasculature  system was discussed at length -We have discussed port-a-cath placement -The planned procedure, material risks (including, but not limited to, pain, bleeding, infection, scarring, need for blood transfusion, damage to surrounding structures- blood vessels/nerves/viscus/organs, damage to artery, stroke, pneumothorax, hemothorax, need for chest tube placement, need for additional procedures, worsening of pre-existing medical conditions, heart attack, death) benefits and alternatives to surgery were discussed at length. The patient's questions were answered to his satisfaction, he voiced understanding and elected to proceed with surgery. Additionally, we discussed typical postoperative expectations and the recovery process.   Nadeen Landau, [facility], [place]

## 2021-10-15 NOTE — Transfer of Care (Signed)
Immediate Anesthesia Transfer of Care Note  Patient: [place] Pink  Procedure(s) Performed: INSERTION PORT-A-CATH  Patient Location: PACU  Anesthesia Type:General  Level of Consciousness: sedated  Airway & Oxygen Therapy: Patient Spontanous Breathing and Patient connected to face mask oxygen  Post-op Assessment: Report given to RN and Post -op Vital signs reviewed and stable  Post vital signs: Reviewed and stable  Last Vitals:  Vitals Value Taken Time  BP 113/62 10/15/21 1011  Temp    Pulse 72 10/15/21 1013  Resp 13 10/15/21 1013  SpO2 100 % 10/15/21 1013  Vitals shown include unvalidated device data.  Last Pain:  Vitals:   10/15/21 0743  TempSrc:   PainSc: 0-No pain      Patients Stated Pain Goal: 3 (03/70/96 4383)  Complications: No notable events documented.

## 2021-10-16 ENCOUNTER — Other Ambulatory Visit: Payer: Self-pay | Admitting: Internal Medicine

## 2021-10-16 ENCOUNTER — Ambulatory Visit (HOSPITAL_COMMUNITY)
Admission: RE | Admit: 2021-10-16 | Discharge: 2021-10-16 | Disposition: A | Discharge status: Home / Self Care | Payer: Medicare Other | Source: Ambulatory Visit | Attending: Hematology | Admitting: Hematology

## 2021-10-16 ENCOUNTER — Encounter (HOSPITAL_COMMUNITY): Payer: Self-pay | Admitting: Surgery

## 2021-10-16 DIAGNOSIS — I700 Atherosclerosis of aorta: Secondary | ICD-10-CM | POA: Diagnosis not present

## 2021-10-16 DIAGNOSIS — Z452 Encounter for adjustment and management of vascular access device: Secondary | ICD-10-CM | POA: Diagnosis not present

## 2021-10-16 DIAGNOSIS — C189 Malignant neoplasm of colon, unspecified: Secondary | ICD-10-CM | POA: Diagnosis not present

## 2021-10-20 ENCOUNTER — Other Ambulatory Visit: Payer: Self-pay

## 2021-10-21 MED FILL — Dexamethasone Sodium Phosphate Inj 100 MG/10ML: INTRAMUSCULAR | Qty: 1 | Status: AC

## 2021-10-21 NOTE — Progress Notes (Unsigned)
[place]   [phone] [phone]   Clinic Follow up Note   Patient Care Team: Shirline Frees, MD as PCP - General (Family Medicine) 10/22/2021  CHIEF COMPLAINT: Follow up colon cancer   SUMMARY OF ONCOLOGIC HISTORY: Oncology History  Ascending colon cancer s/p lap colectomy 09/14/2021  07/23/2021 Procedure   Upper GI Endoscopy/Colonoscopy; Dr. Lorenso Courier  Colonoscopy Impression: - Likely malignant partially obstructing tumor in the ascending colon. Biopsied. - Six 3 to 8 mm polyps in the rectum and in the transverse colon, removed with a cold snare. Resected and retrieved. - Non-bleeding internal hemorrhoids.  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 Biopsied. - Multiple gastric polyps. Resected and retrieved. - Erythematous mucosa in the antrum. Biopsied. - Mucosal nodule found in the duodenum. Biopsied. - Dilated lacteals were found in the duodenum. Biopsied. - Two non-bleeding angioectasias in the duodenum.   07/23/2021 Initial Biopsy   Diagnosis 1. Duodenum, Biopsy - BENIGN DUODENAL MUCOSA - NO ACUTE INFLAMMATION, VILLOUS BLUNTING OR INCREASED INTRAEPITHELIAL LYMPHOCYTES IDENTIFIED 2. Duodenum, Biopsy, Nodule - PEPTIC DUODENITIS - NO DYSPLASIA OR MALIGNANCY IDENTIFIED 3. Stomach, biopsy - MILD CHRONIC GASTRITIS WITHOUT ACTIVITY - NO INTESTINAL METAPLASIA IDENTIFIED - SEE COMMENT 4. Stomach, polyp(s) - HYPERPLASTIC GASTRIC POLYP(S) WITH INTESTINAL METAPLASIA - NO H. PYLORI OR MALIGNANCY IDENTIFIED 5. Esophagogastric junction, biopsy - SQUAMOCOLUMNAR JUNCTION WITH CHRONIC INFLAMMATION - NO INTESTINAL METAPLASIA, DYSPLASIA OR MALIGNANCY IDENTIFIED 6. Rectum, biopsy, and transverse - MULTIPLE FRAGMENTS OF HYPERPLASTIC POLYP(S) - NO HIGH-GRADE DYSPLASIA OR MALIGNANCY IDENTIFIED 7. Ascending Colon Biopsy, Mass - POORLY DIFFERENTIATED ADENOCARCINOMA - SEE  COMMENT  Microscopic Comment 7. By immunohistochemistry, the neoplastic cells are positive for CDX2 (patchy, weak) but negative for p63, p40, CD56, chromogranin and synaptophysin. This immunoprofile is consistent with a poorly differentiated adenocarcinoma.  3. H. pylori immunohistochemistry is POSITIVE for RARE microorganisms.   07/30/2021 Imaging   EXAM: CT CHEST, ABDOMEN, AND PELVIS WITH CONTRAST  IMPRESSION: 1. Masslike region in the ascending colon extending to the level of the hepatic flexure in the right lower quadrant, concerning for malignancy. Associated pericolonic fat stranding and a few prominent lymph nodes are seen adjacent to this segment of colon, suspicious for local infiltration. No distant metastasis is seen. 2. Left inguinal hernia containing nonobstructed colon. 3. Nonspecific small ground-glass opacity in the in the right lower lobe. Attention on follow-up is recommended. 4. Small hiatal hernia. 5. Aortic atherosclerosis and coronary artery calcifications   08/06/2021 Initial Diagnosis   Malignant neoplasm of ascending colon [facility])   Primary adenocarcinoma of ascending colon ([place])  07/23/2021 Procedure   Colonoscopy by Dr. Lorenso Courier - A frond-like/villous, fungating and ulcerated partially obstructing large mass was found in the ascending colon. There was a point at which the mass could not be bypassed, even with use of an ultraslim colonoscope. It is suspected that this mass encompasses the entire cecum and ascending colon. The mass was circumferential. The mass measured fifteen cm in length. Oozing was present.    07/23/2021 Initial Biopsy   3. H. pylori immunohistochemistry is POSITIVE for RARE microorganisms.  FINAL DIAGNOSIS Diagnosis 1. Duodenum, Biopsy - BENIGN DUODENAL MUCOSA - NO ACUTE INFLAMMATION, VILLOUS BLUNTING OR INCREASED INTRAEPITHELIAL LYMPHOCYTES IDENTIFIED 2. Duodenum, Biopsy, Nodule - PEPTIC DUODENITIS - NO DYSPLASIA OR MALIGNANCY  IDENTIFIED 3. Stomach, biopsy - MILD CHRONIC GASTRITIS WITHOUT ACTIVITY - NO INTESTINAL METAPLASIA IDENTIFIED - SEE COMMENT 4. Stomach, polyp(s) - HYPERPLASTIC GASTRIC POLYP(S) WITH INTESTINAL METAPLASIA - NO H. PYLORI  OR MALIGNANCY IDENTIFIED 5. Esophagogastric junction, biopsy - SQUAMOCOLUMNAR JUNCTION WITH CHRONIC INFLAMMATION - NO INTESTINAL METAPLASIA, DYSPLASIA OR MALIGNANCY IDENTIFIED 6. Rectum, biopsy, and transverse - MULTIPLE FRAGMENTS OF HYPERPLASTIC POLYP(S) - NO HIGH-GRADE DYSPLASIA OR MALIGNANCY IDENTIFIED 7. Ascending Colon Biopsy, Mass - POORLY DIFFERENTIATED ADENOCARCINOMA - SEE COMMENT   07/30/2021 Imaging   CT CAP IMPRESSION: 1. Masslike region in the ascending colon extending to the level of the hepatic flexure in the right lower quadrant, concerning for malignancy. Associated pericolonic fat stranding and a few prominent lymph nodes are seen adjacent to this segment of colon, suspicious for local infiltration. No distant metastasis is seen. 2. Left inguinal hernia containing nonobstructed colon. 3. Nonspecific small ground-glass opacity in the in the right lower lobe. Attention on follow-up is recommended. 4. Small hiatal hernia. 5. Aortic atherosclerosis and coronary artery calcifications   09/14/2021 Surgery   PROCEDURE:  Laparoscopic converted to open right hemicolectomy Repair of small bowel serosa x 2     09/14/2021 Pathology Results   A. COLON, RIGHT, HEMI COLECTOMY:  Poorly differentiated adenocarcinoma in the ascending colon with clear margins of resection.  Tumor Size: Circumferential measuring 9.0 cm X 8.0 cm. X 2.5 cm.  Macroscopic Tumor Perforation: Not identified  Histologic Type: Adenocarcinoma.  Histologic Grade: Grade 3, poorly differentiated.  Multiple Primary Sites: Not applicable  Tumor Extension: Extends through the muscular wall into the pericolonic adipose tissue.  Lymphovascular Invasion: Not identified.  Perineural Invasion: Not  identified.  Treatment Effect: No known presurgical therapy.  Margins:       Margin Status for Invasive Carcinoma: All margins are negative for invasive carcinoma       Distance from Invasive Carcinoma to proximal margin: 11 cm.       Distance from invasive carcinoma to distal margin: 12 cm.  Regional Lymph Nodes:       Number of Lymph Nodes with Tumor: None.       Number of Lymph Nodes Examined: Twenty-two (22)  Tumor Deposits: Present (slide A11)  Distant Metastasis:       Distant Site(s) Involved: None known.  Pathologic Stage Classification (pTNM, AJCC 8th Edition): pT3 pN1c  Comments: Omentum is free of neoplastic invasion.                      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LAMN)    09/14/2021 Cancer Staging   Staging form: Colon and Rectum, AJCC 8th Edition - Pathologic stage from 09/14/2021: Stage IIIB (pT3, pN1c, cM0) - Signed by Alla Feeling, NP on 10/08/2021 Stage prefix: Initial diagnosis Total positive nodes: 0 Histologic grading system: 4 grade system Histologic grade (G): G3 Laterality: Right Lymph-vascular invasion (LVI): LVI not present (absent)/not identified Tumor deposits (TD): Present Perineural invasion (PNI): Absent Microsatellite instability (MSI): Unstable high BRAF mutation: Positive   10/08/2021 Initial Diagnosis   Primary adenocarcinoma of ascending colon ([place])   10/22/2021 -  Chemotherapy   Patient is on Treatment Plan : COLORECTAL FOLFOX q14d x 3 months       CURRENT THERAPY: PENDING adjuvant FOLFOX q2 weeks x3 months  INTERVAL HISTORY: Mr. Middlesworth returns for follow up as scheduled. Last seen by [place] 10/08/21 to review surgical path. He has continued to recover well and is here to begin adjuvant chemo. Port placed by Dr. Dema Severin 7/27 which he tolerated well.  Overall he feels stronger, less tired.  He is eating and drinking and active at home.  Denies pain.  Bowels moving normally.  Denies  fever, chills, new cough, chest pain, dyspnea, or other  new complaints.  All other systems were reviewed with the patient and are negative.  MEDICAL HISTORY:  Past Medical History:  Diagnosis Date   Acute cholecystitis 02/19/2018   Anemia    Chronic kidney disease    Colon cancer ([place])    Diabetes mellitus without complication (HCC)    diet controlled   GERD (gastroesophageal reflux disease)    History of blood transfusion 08/2021   History of hiatal hernia    Hypertension    Hypothyroidism    Iron deficiency anemia    Peripheral neuropathy    bilateral feet    SURGICAL HISTORY: Past Surgical History:  Procedure Laterality Date   CHOLECYSTECTOMY N/A 02/20/2018   Procedure: LAPAROSCOPIC CHOLECYSTECTOMY;  Surgeon: Excell Seltzer, MD;  Location: [facility];  Service: General;  Laterality: N/A;   COLONOSCOPY  2023   LAPAROSCOPIC RIGHT HEMI COLECTOMY Right 09/14/2021   Procedure: LAPAROSCOPIC TO OPEN RIGHT HEMI COLECTOMY;  Surgeon: Ileana Roup, MD;  Location: [facility];  Service: General;  Laterality: Right;   PORTACATH PLACEMENT N/A 10/15/2021   Procedure: INSERTION PORT-A-CATH;  Surgeon: Ileana Roup, MD;  Location: [facility];  Service: General;  Laterality: N/A;  with ultrasound and flouroscopic guidance    I have reviewed the social history and family history with the patient and they are unchanged from previous note.  ALLERGIES:  has No Known Allergies.  MEDICATIONS:  Current Outpatient Medications  Medication Sig Dispense Refill   acetaminophen (TYLENOL) 500 MG tablet Take 500-1,000 mg by mouth every 6 (six) hours as needed for moderate pain.     ARTIFICIAL TEAR SOLUTION OP Place 1 drop into both eyes daily as needed (dry eyes).     cyanocobalamin (,VITAMIN B-12,) 1000 MCG/ML injection Inject 1,000 mcg into the muscle every 30 (thirty) days.   0   Iron, Ferrous Sulfate, 325 (65 Fe) MG TABS Take 325 mg by mouth daily.     levothyroxine (SYNTHROID) 75 MCG tablet Take 75 mcg by mouth daily before breakfast.      lidocaine-prilocaine (EMLA) cream Apply to affected area once 30 g 3   lisinopril (PRINIVIL,ZESTRIL) 20 MG tablet Take 20 mg by mouth daily.  3   omeprazole (PRILOSEC) 40 MG capsule Take 1 capsule (40 mg total) by mouth daily. 30 minutes before meals 30 capsule 3   ondansetron (ZOFRAN) 8 MG tablet Take 1 tablet (8 mg total) by mouth 2 (two) times daily as needed for refractory nausea / vomiting. Start on day 3 after chemotherapy. 30 tablet 1   polyethylene glycol powder (GLYCOLAX/MIRALAX) 17 GM/SCOOP powder Take 17 g by mouth as needed for mild constipation. (Patient taking differently: Take 17 g by mouth daily as needed for mild constipation.)     prochlorperazine (COMPAZINE) 10 MG tablet Take 1 tablet (10 mg total) by mouth every 6 (six) hours as needed (Nausea or vomiting). 30 tablet 1   traMADol (ULTRAM) 50 MG tablet Take 1-2 tablets (50-100 mg total) by mouth every 6 (six) hours as needed (tylenol first). 10 tablet 0   vitamin C (ASCORBIC ACID) 500 MG tablet Take 500 mg by mouth daily.     No current facility-administered medications for this visit.   Facility-Administered Medications Ordered in Other Visits  Medication Dose Route Frequency Provider Last Rate Last Admin   fluorouracil (ADRUCIL) 4,300 mg in sodium chloride 0.9 % 64 mL chemo infusion  2,000 mg/m2 (Treatment Plan Recorded) Intravenous 1 day  or 1 dose Truitt Merle, MD       leucovorin 856 mg in dextrose 5 % 250 mL infusion  400 mg/m2 (Treatment Plan Recorded) Intravenous Once Truitt Merle, MD 146 mL/hr at 10/22/21 1154 856 mg at 10/22/21 1154   oxaliplatin (ELOXATIN) 150 mg in dextrose 5 % 500 mL chemo infusion  70 mg/m2 (Treatment Plan Recorded) Intravenous Once Truitt Merle, MD 265 mL/hr at 10/22/21 1157 150 mg at 10/22/21 1157    PHYSICAL EXAMINATION: ECOG PERFORMANCE STATUS: 1 - Symptomatic but completely ambulatory  Vitals:   10/22/21 0935  BP: (!) 143/79  Pulse: 81  Resp: (!) 21  Temp: 97.9 F (36.6 C)  SpO2: 100%   Filed  Weights   10/22/21 0935  Weight: 204 lb 12.8 oz (92.9 kg)    GENERAL:alert, no distress and comfortable SKIN: No rash EYES: sclera clear LUNGS: clear with normal breathing effort HEART: regular rate & rhythm, trace bilateral lower extremity edema ABDOMEN:abdomen soft, non-tender and normal bowel sounds.  Incisions completely healed NEURO: alert & oriented x 3 with fluent speech, no focal motor/sensory deficits PAC without erythema  LABORATORY DATA:  I have reviewed the data as listed    Latest Ref Rng & Units 10/22/2021    9:12 AM 10/08/2021    8:29 AM 09/22/2021    7:08 AM  CBC  WBC 4.0 - 10.5 K/uL 6.4  4.6    Hemoglobin 13.0 - 17.0 g/dL 9.2  9.6  9.7   Hematocrit 39.0 - 52.0 % 29.0  30.4  30.8   Platelets 150 - 400 K/uL 220  236          Latest Ref Rng & Units 10/22/2021    9:12 AM 10/08/2021    8:29 AM 09/21/2021    4:28 AM  CMP  Glucose 70 - 99 mg/dL 160  124    BUN 8 - 23 mg/dL 20  15    Creatinine 0.61 - 1.24 mg/dL 0.98  1.14  0.96   Sodium 135 - 145 mmol/L 137  135    Potassium 3.5 - 5.1 mmol/L 4.0  4.2  3.9   Chloride 98 - 111 mmol/L 105  102    CO2 22 - 32 mmol/L 26  26    Calcium 8.9 - 10.3 mg/dL 9.3  9.7    Total Protein 6.5 - 8.1 g/dL 6.8  7.0    Total Bilirubin 0.3 - 1.2 mg/dL 0.4  0.3    Alkaline Phos 38 - 126 U/L 67  67    AST 15 - 41 U/L 13  12    ALT 0 - 44 U/L 10  7        RADIOGRAPHIC STUDIES: I have personally reviewed the radiological images as listed and agreed with the findings in the report. No results found.   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 EGD showed duodenitis and gastritis, colonoscopy showed a large 15 cm partially obstructing mass in the ascending colon up to the hepatic flexure. Path confirmed poorly  differentiated adenocarcinoma -staging work up showed prominent but not pathologically enlarged local LNs, concerning for nodal involvement. There was indeterminate groud glass opacity in the right lung base but no distant mets.  -Baseline CEA was normal, 1.35 (08/05/21) -He underwent laparoscopic converted to open  right colectomy by Dr. Annye English 09/14/21, tolerated surgery well and has recovered -Surgical path shows a large right colon cancer extending through the muscularis propria was completely removed, although lymph nodes were negative he had tumor deposits and positive margin along the right kidney and lateral most part of duodenum, this was staged pT3,pN1c, stage IIIb.   -Molecular features showed loss of MLH1 and PMS2 with BRAF mutation and MLH1 hyper methylation overall indicative of a sporadic cancer rather than a germline/Lynch syndrome associated cancer. -We discussed his case in our tumor conference, the recommendation is for adjuvant chemo to reduce his risk of recurrence.  I clarified with Dr. Dema Severin who notes part of the tumor was wide open resulting in a defect in the colon in an area overlying the right kidney and lateral most part of the duodenum, consistent with a positive margin.  Therefore he is also a candidate for adjuvant radiation and agrees to rad onc referral which I placed today -Port placed 10/15/2021 by Dr. Dema Severin -Mr. Harr appears stable.  He has recovered well from surgery and is ready to begin adjuvant chemo with dose reduced FOLFOX today as planned.  We again reviewed the rationale for adjuvant chemo, potential side effects, and symptom management.  He is ready to proceed.  Labs are adequate. -Follow-up next week for toxicity check, then in 2 weeks for cycle 2   Right upper quadrant pain, diarrhea, low appetite, weight loss -Secondary to #1 -Symptoms began approximately 6 months prior to diagnosis -RUQ pain was intermittent initially but constant now, mild unless he  overeats.  -diarrhea usually 2/day. He knows to take imodium if this increases. He appears hydrated.  -Pain resolved after surgery.  Appetite has much improved and he is gaining weight.  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 C/w mild IDA  -he began oral B12 and oral iron in 05/2021.  -He received IV Feraheme and 1 unit of blood while hospitalized for surgery -Anemia remains mild to moderate, Hgb stable in 9 range, we will give Feraheme x2 more doses with cycles 1 and 2 of FOLFOX   Hypothyroidism, HTN, pre-DM, GERD, CKD -on lisinopril and synthroid. Does not take many medications -SCr up to 2.2 in 2019 but has normalized since then -per PCP    Social  -Mr. Guardado is the primary care giver for his wife who has Parkinson's. They live in a 2 story home and have a farm house that is ADA approved -pt is independent with ADLs.  -he has 2 adult children who live locally and have been strong support system during this time    6. goals of care  -He understands the goal of adjuvant therapy is curative, to reduce recurrence risk.     Plan: -Proceed with cycle 1 dose reduced FOLFOX today as planned and Feraheme -Reviewed potential side effects and symptom management -Referral to radiation -Follow-up next week for toxicity check then in 2 weeks with cycle 2   Orders Placed This Encounter  Procedures   Ambulatory referral to Radiation Oncology    Referral Priority:   Routine    Referral Type:   Consultation    Referral Reason:   Specialty Services Required    Requested  Specialty:   Radiation Oncology    Number of Visits Requested:   1   All questions were answered. The patient knows to call the clinic with any  problems, questions or concerns. No barriers to learning was detected. I spent 20 minutes counseling the patient face to face. The total time spent in the appointment was 30 minutes and more than 50% was on counseling and review of test results and coordination of care.      Alla Feeling, NP 10/22/21

## 2021-10-22 ENCOUNTER — Inpatient Hospital Stay: Payer: Medicare Other

## 2021-10-22 ENCOUNTER — Inpatient Hospital Stay (HOSPITAL_BASED_OUTPATIENT_CLINIC_OR_DEPARTMENT_OTHER): Payer: Medicare Other | Admitting: Nurse Practitioner

## 2021-10-22 ENCOUNTER — Other Ambulatory Visit: Payer: Self-pay

## 2021-10-22 ENCOUNTER — Inpatient Hospital Stay: Payer: Medicare Other | Attending: Nurse Practitioner

## 2021-10-22 ENCOUNTER — Encounter: Payer: Self-pay | Admitting: Nurse Practitioner

## 2021-10-22 VITALS — BP 143/79 | HR 81 | Temp 97.9°F | Resp 21 | Ht 73.0 in | Wt 204.8 lb

## 2021-10-22 DIAGNOSIS — C182 Malignant neoplasm of ascending colon: Secondary | ICD-10-CM

## 2021-10-22 DIAGNOSIS — Z9049 Acquired absence of other specified parts of digestive tract: Secondary | ICD-10-CM | POA: Diagnosis not present

## 2021-10-22 DIAGNOSIS — Z95828 Presence of other vascular implants and grafts: Secondary | ICD-10-CM

## 2021-10-22 DIAGNOSIS — Z5111 Encounter for antineoplastic chemotherapy: Secondary | ICD-10-CM | POA: Diagnosis not present

## 2021-10-22 DIAGNOSIS — D500 Iron deficiency anemia secondary to blood loss (chronic): Secondary | ICD-10-CM

## 2021-10-22 DIAGNOSIS — D509 Iron deficiency anemia, unspecified: Secondary | ICD-10-CM | POA: Diagnosis not present

## 2021-10-22 LAB — CMP (CANCER CENTER ONLY)
ALT: 10 U/L (ref 0–44)
AST: 13 U/L — ABNORMAL LOW (ref 15–41)
Albumin: 3.8 g/dL (ref 3.5–5.0)
Alkaline Phosphatase: 67 U/L (ref 38–126)
Anion gap: 6 (ref 5–15)
BUN: 20 mg/dL (ref 8–23)
CO2: 26 mmol/L (ref 22–32)
Calcium: 9.3 mg/dL (ref 8.9–10.3)
Chloride: 105 mmol/L (ref 98–111)
Creatinine: 0.98 mg/dL (ref 0.61–1.24)
GFR, Estimated: >60 mL/min (ref >60)
Glucose, Bld: 160 mg/dL — ABNORMAL HIGH (ref 70–99)
Potassium: 4 mmol/L (ref 3.5–5.1)
Sodium: 137 mmol/L (ref 135–145)
Total Bilirubin: 0.4 mg/dL (ref 0.3–1.2)
Total Protein: 6.8 g/dL (ref 6.5–8.1)

## 2021-10-22 LAB — CBC WITH DIFFERENTIAL (CANCER CENTER ONLY)
Abs Immature Granulocytes: 0.02 10*3/uL (ref 0.00–0.07)
Basophils Absolute: 0 10*3/uL (ref 0.0–0.1)
Basophils Relative: 0 %
Eosinophils Absolute: 0.1 10*3/uL (ref 0.0–0.5)
Eosinophils Relative: 1 %
HCT: 29 % — ABNORMAL LOW (ref 39.0–52.0)
Hemoglobin: 9.2 g/dL — ABNORMAL LOW (ref 13.0–17.0)
Immature Granulocytes: 0 %
Lymphocytes Relative: 13 %
Lymphs Abs: 0.9 10*3/uL (ref 0.7–4.0)
MCH: 26.7 pg (ref 26.0–34.0)
MCHC: 31.7 g/dL (ref 30.0–36.0)
MCV: 84.3 fL (ref 80.0–100.0)
Monocytes Absolute: 0.5 10*3/uL (ref 0.1–1.0)
Monocytes Relative: 7 %
Neutro Abs: 5 10*3/uL (ref 1.7–7.7)
Neutrophils Relative %: 79 %
Platelet Count: 220 10*3/uL (ref 150–400)
RBC: 3.44 MIL/uL — ABNORMAL LOW (ref 4.22–5.81)
RDW: 18.2 % — ABNORMAL HIGH (ref 11.5–15.5)
WBC Count: 6.4 10*3/uL (ref 4.0–10.5)
nRBC: 0 % (ref 0.0–0.2)

## 2021-10-22 LAB — IRON AND IRON BINDING CAPACITY (CC-WL,HP ONLY)
Iron: 43 ug/dL — ABNORMAL LOW (ref 45–182)
Saturation Ratios: 15 % — ABNORMAL LOW (ref 17.9–39.5)
TIBC: 295 ug/dL (ref 250–450)
UIBC: 252 ug/dL (ref 117–376)

## 2021-10-22 LAB — FERRITIN: Ferritin: 31 ng/mL (ref 24–336)

## 2021-10-22 LAB — CEA (IN HOUSE-CHCC): CEA (CHCC-In House): 1.11 ng/mL (ref 0.00–5.00)

## 2021-10-22 MED ORDER — SODIUM CHLORIDE 0.9 % IV SOLN
10.0000 mg | Freq: Once | INTRAVENOUS | Status: AC
  Administered 2021-10-22: 10 mg via INTRAVENOUS
  Filled 2021-10-22: qty 10

## 2021-10-22 MED ORDER — LEUCOVORIN CALCIUM INJECTION 350 MG
400.0000 mg/m2 | Freq: Once | INTRAVENOUS | Status: AC
  Administered 2021-10-22: 856 mg via INTRAVENOUS
  Filled 2021-10-22: qty 42.8

## 2021-10-22 MED ORDER — SODIUM CHLORIDE 0.9 % IV SOLN
2000.0000 mg/m2 | INTRAVENOUS | Status: DC
  Administered 2021-10-22: 4300 mg via INTRAVENOUS
  Filled 2021-10-22: qty 86

## 2021-10-22 MED ORDER — PALONOSETRON HCL INJECTION 0.25 MG/5ML
0.2500 mg | Freq: Once | INTRAVENOUS | Status: AC
  Administered 2021-10-22: 0.25 mg via INTRAVENOUS
  Filled 2021-10-22: qty 5

## 2021-10-22 MED ORDER — DEXTROSE 5 % IV SOLN: Freq: Once | INTRAVENOUS | Status: AC

## 2021-10-22 MED ORDER — OXALIPLATIN CHEMO INJECTION 100 MG/20ML
70.0000 mg/m2 | Freq: Once | INTRAVENOUS | Status: AC
  Administered 2021-10-22: 150 mg via INTRAVENOUS
  Filled 2021-10-22: qty 30

## 2021-10-22 MED ORDER — SODIUM CHLORIDE 0.9% FLUSH
10.0000 mL | INTRAVENOUS | Status: AC | PRN
  Administered 2021-10-22: 10 mL

## 2021-10-22 MED ORDER — SODIUM CHLORIDE 0.9 % IV SOLN
510.0000 mg | Freq: Once | INTRAVENOUS | Status: AC
  Administered 2021-10-22: 510 mg via INTRAVENOUS
  Filled 2021-10-22: qty 510

## 2021-10-22 NOTE — Patient Instructions (Addendum)
[facility]  Discharge Instructions: Thank you for choosing [facility] to provide your oncology and hematology care.   If you have a lab appointment with the [facility], please go directly to the [facility] and check in at the registration area.   Wear comfortable clothing and clothing appropriate for easy access to any Portacath or PICC line.   We strive to give you quality time with your provider. You may need to reschedule your appointment if you arrive late (15 or more minutes).  Arriving late affects you and other patients whose appointments are after yours.  Also, if you miss three or more appointments without notifying the office, you may be dismissed from the clinic at the provider's discretion.      For prescription refill requests, have your pharmacy contact our office and allow 72 hours for refills to be completed.    Today you received the following chemotherapy and/or immunotherapy agents: Oxaliplatin and Fluorouracil       To help prevent nausea and vomiting after your treatment, we encourage you to take your nausea medication as directed.  BELOW ARE SYMPTOMS THAT SHOULD BE REPORTED IMMEDIATELY: *FEVER GREATER THAN 100.4 F (38 C) OR HIGHER *CHILLS OR SWEATING *NAUSEA AND VOMITING THAT IS NOT CONTROLLED WITH YOUR NAUSEA MEDICATION *UNUSUAL SHORTNESS OF BREATH *UNUSUAL BRUISING OR BLEEDING *URINARY PROBLEMS (pain or burning when urinating, or frequent urination) *BOWEL PROBLEMS (unusual diarrhea, constipation, pain near the anus) TENDERNESS IN MOUTH AND THROAT WITH OR WITHOUT PRESENCE OF ULCERS (sore throat, sores in mouth, or a toothache) UNUSUAL RASH, SWELLING OR PAIN  UNUSUAL VAGINAL DISCHARGE OR ITCHING   Items with * indicate a potential emergency and should be followed up as soon as possible or go to the Emergency Department if any problems should occur.  Please show the CHEMOTHERAPY ALERT CARD or IMMUNOTHERAPY ALERT  CARD at check-in to the Emergency Department and triage nurse.  Should you have questions after your visit or need to cancel or reschedule your appointment, please contact [facility]  Dept: [phone]  and follow the prompts.  Office hours are 8:00 a.m. to 4:30 p.m. Monday - Friday. Please note that voicemails left after 4:00 p.m. may not be returned until the following business day.  We are closed weekends and major holidays. You have access to a nurse at all times for urgent questions. Please call the main number to the clinic Dept: [phone] and follow the prompts.   For any non-urgent questions, you may also contact your provider using MyChart. We now offer e-Visits for anyone 86 and older to request care online for non-urgent symptoms. For details visit [email or web address].   Also download the MyChart app! Go to the app store, search "MyChart", open the app, select [facility], and log in with your MyChart username and password.  Masks are optional in the cancer centers. If you would like for your care team to wear a mask while they are taking care of you, please let them know. You may have one support person who is at least 86 years old accompany you for your appointment. Oxaliplatin Injection What is this medication? OXALIPLATIN (ox AL i PLA tin) treats some types of cancer. It works by slowing down the growth of cancer cells. This medicine may be used for other purposes; ask your health care provider or pharmacist if you have questions. COMMON BRAND NAME(S): Eloxatin What should I tell my care team before I  take this medication? They need to know if you have any of these conditions: Heart disease History of irregular heartbeat or rhythm Liver disease Low blood cell levels (white cells, red cells, and platelets) Lung or breathing disease, such as asthma Take medications that treat or prevent blood clots Tingling of the fingers, toes, or other  nerve disorder An unusual or allergic reaction to oxaliplatin, other medications, foods, dyes, or preservatives If you or your partner are pregnant or trying to get pregnant Breast-feeding How should I use this medication? This medication is injected into a vein. It is given by your care team in a hospital or clinic setting. Talk to your care team about the use of this medication in children. Special care may be needed. Overdosage: If you think you have taken too much of this medicine contact a poison control center or emergency room at once. NOTE: This medicine is only for you. Do not share this medicine with others. What if I miss a dose? Keep appointments for follow-up doses. It is important not to miss a dose. Call your care team if you are unable to keep an appointment. What may interact with this medication? Do not take this medication with any of the following: Cisapride Dronedarone Pimozide Thioridazine This medication may also interact with the following: Aspirin and aspirin-like medications Certain medications that treat or prevent blood clots, such as warfarin, apixaban, dabigatran, and rivaroxaban Cisplatin Cyclosporine Diuretics Medications for infection, such as acyclovir, adefovir, amphotericin B, bacitracin, cidofovir, foscarnet, ganciclovir, gentamicin, pentamidine, vancomycin NSAIDs, medications for pain and inflammation, such as ibuprofen or naproxen Other medications that cause heart rhythm changes Pamidronate Zoledronic acid This list may not describe all possible interactions. Give your health care provider a list of all the medicines, herbs, non-prescription drugs, or dietary supplements you use. Also tell them if you smoke, drink alcohol, or use illegal drugs. Some items may interact with your medicine. What should I watch for while using this medication? Your condition will be monitored carefully while you are receiving this medication. You may need blood work  while taking this medication. This medication may make you feel generally unwell. This is not uncommon as chemotherapy can affect healthy cells as well as cancer cells. Report any side effects. Continue your course of treatment even though you feel ill unless your care team tells you to stop. This medication may increase your risk of getting an infection. Call your care team for advice if you get a fever, chills, sore throat, or other symptoms of a cold or flu. Do not treat yourself. Try to avoid being around people who are sick. Avoid taking medications that contain aspirin, acetaminophen, ibuprofen, naproxen, or ketoprofen unless instructed by your care team. These medications may hide a fever. Be careful brushing or flossing your teeth or using a toothpick because you may get an infection or bleed more easily. If you have any dental work done, tell your dentist you are receiving this medication. This medication can make you more sensitive to cold. Do not drink cold drinks or use ice. Cover exposed skin before coming in contact with cold temperatures or cold objects. When out in cold weather wear warm clothing and cover your mouth and nose to warm the air that goes into your lungs. Tell your care team if you get sensitive to the cold. Talk to your care team if you or your partner are pregnant or think either of you might be pregnant. This medication can cause serious birth defects  if taken during pregnancy and for 9 months after the last dose. A negative pregnancy test is required before starting this medication. A reliable form of contraception is recommended while taking this medication and for 9 months after the last dose. Talk to your care team about effective forms of contraception. Do not father a child while taking this medication and for 6 months after the last dose. Use a condom while having sex during this time period. Do not breastfeed while taking this medication and for 3 months after the last  dose. This medication may cause infertility. Talk to your care team if you are concerned about your fertility. What side effects may I notice from receiving this medication? Side effects that you should report to your care team as soon as possible: Allergic reactions--skin rash, itching, hives, swelling of the face, lips, tongue, or throat Bleeding--bloody or black, tar-like stools, vomiting blood or brown material that looks like coffee grounds, red or dark brown urine, small red or purple spots on skin, unusual bruising or bleeding Dry cough, shortness of breath or trouble breathing Heart rhythm changes--fast or irregular heartbeat, dizziness, feeling faint or lightheaded, chest pain, trouble breathing Infection--fever, chills, cough, sore throat, wounds that don't heal, pain or trouble when passing urine, general feeling of discomfort or being unwell Liver injury--right upper belly pain, loss of appetite, nausea, light-colored stool, dark yellow or brown urine, yellowing skin or eyes, unusual weakness or fatigue Low red blood cell level--unusual weakness or fatigue, dizziness, headache, trouble breathing Muscle injury--unusual weakness or fatigue, muscle pain, dark yellow or brown urine, decrease in amount of urine Pain, tingling, or numbness in the hands or feet Sudden and severe headache, confusion, change in vision, seizures, which may be signs of posterior reversible encephalopathy syndrome (PRES) Unusual bruising or bleeding Side effects that usually do not require medical attention (report to your care team if they continue or are bothersome): Diarrhea Nausea Pain, redness, or swelling with sores inside the mouth or throat Unusual weakness or fatigue Vomiting This list may not describe all possible side effects. Call your doctor for medical advice about side effects. You may report side effects to FDA at 1-800-FDA-1088. Where should I keep my medication? This medication is given in a  hospital or clinic. It will not be stored at home. NOTE: This sheet is a summary. It may not cover all possible information. If you have questions about this medicine, talk to your doctor, pharmacist, or health care provider.  2023 Elsevier/Gold Standard (2021-07-03 00:00:00)  Leucovorin Injection What is this medication? LEUCOVORIN (loo koe VOR in) prevents side effects from certain medications, such as methotrexate. It works by increasing folate levels. This helps protect healthy cells in your body. It may also be used to treat anemia caused by low levels of folate. It can also be used with fluorouracil, a type of chemotherapy, to treat colorectal cancer. It works by increasing the effects of fluorouracil in the body. This medicine may be used for other purposes; ask your health care provider or pharmacist if you have questions. What should I tell my care team before I take this medication? They need to know if you have any of these conditions: Anemia from low levels of vitamin B12 in the blood An unusual or allergic reaction to leucovorin, folic acid, other medications, foods, dyes, or preservatives Pregnant or trying to get pregnant Breastfeeding How should I use this medication? This medication is injected into a vein or a muscle. It is given  by your care team in a hospital or clinic setting. Talk to your care team about the use of this medication in children. Special care may be needed. Overdosage: If you think you have taken too much of this medicine contact a poison control center or emergency room at once. NOTE: This medicine is only for you. Do not share this medicine with others. What if I miss a dose? Keep appointments for follow-up doses. It is important not to miss your dose. Call your care team if you are unable to keep an appointment. What may interact with this medication? Capecitabine Fluorouracil Phenobarbital Phenytoin Primidone Trimethoprim;sulfamethoxazole This list  may not describe all possible interactions. Give your health care provider a list of all the medicines, herbs, non-prescription drugs, or dietary supplements you use. Also tell them if you smoke, drink alcohol, or use illegal drugs. Some items may interact with your medicine. What should I watch for while using this medication? Your condition will be monitored carefully while you are receiving this medication. This medication may increase the side effects of 5-fluorouracil. Tell your care team if you have diarrhea or mouth sores that do not get better or that get worse. What side effects may I notice from receiving this medication? Side effects that you should report to your care team as soon as possible: Allergic reactions--skin rash, itching, hives, swelling of the face, lips, tongue, or throat This list may not describe all possible side effects. Call your doctor for medical advice about side effects. You may report side effects to FDA at 1-800-FDA-1088. Where should I keep my medication? This medication is given in a hospital or clinic. It will not be stored at home. NOTE: This sheet is a summary. It may not cover all possible information. If you have questions about this medicine, talk to your doctor, pharmacist, or health care provider.  2023 Elsevier/Gold Standard (2021-07-17 00:00:00) Fluorouracil Injection What is this medication? FLUOROURACIL (flure oh YOOR a sil) treats some types of cancer. It works by slowing down the growth of cancer cells. This medicine may be used for other purposes; ask your health care provider or pharmacist if you have questions. COMMON BRAND NAME(S): Adrucil What should I tell my care team before I take this medication? They need to know if you have any of these conditions: Blood disorders Dihydropyrimidine dehydrogenase (DPD) deficiency Infection, such as chickenpox, cold sores, herpes Kidney disease Liver disease Poor nutrition Recent or ongoing  radiation therapy An unusual or allergic reaction to fluorouracil, other medications, foods, dyes, or preservatives If you or your partner are pregnant or trying to get pregnant Breast-feeding How should I use this medication? This medication is injected into a vein. It is administered by your care team in a hospital or clinic setting. Talk to your care team about the use of this medication in children. Special care may be needed. Overdosage: If you think you have taken too much of this medicine contact a poison control center or emergency room at once. NOTE: This medicine is only for you. Do not share this medicine with others. What if I miss a dose? Keep appointments for follow-up doses. It is important not to miss your dose. Call your care team if you are unable to keep an appointment. What may interact with this medication? Do not take this medication with any of the following: Live virus vaccines This medication may also interact with the following: Medications that treat or prevent blood clots, such as warfarin, enoxaparin, dalteparin This  list may not describe all possible interactions. Give your health care provider a list of all the medicines, herbs, non-prescription drugs, or dietary supplements you use. Also tell them if you smoke, drink alcohol, or use illegal drugs. Some items may interact with your medicine. What should I watch for while using this medication? Your condition will be monitored carefully while you are receiving this medication. This medication may make you feel generally unwell. This is not uncommon as chemotherapy can affect healthy cells as well as cancer cells. Report any side effects. Continue your course of treatment even though you feel ill unless your care team tells you to stop. In some cases, you may be given additional medications to help with side effects. Follow all directions for their use. This medication may increase your risk of getting an infection.  Call your care team for advice if you get a fever, chills, sore throat, or other symptoms of a cold or flu. Do not treat yourself. Try to avoid being around people who are sick. This medication may increase your risk to bruise or bleed. Call your care team if you notice any unusual bleeding. Be careful brushing or flossing your teeth or using a toothpick because you may get an infection or bleed more easily. If you have any dental work done, tell your dentist you are receiving this medication. Avoid taking medications that contain aspirin, acetaminophen, ibuprofen, naproxen, or ketoprofen unless instructed by your care team. These medications may hide a fever. Do not treat diarrhea with over the counter products. Contact your care team if you have diarrhea that lasts more than 2 days or if it is severe and watery. This medication can make you more sensitive to the sun. Keep out of the sun. If you cannot avoid being in the sun, wear protective clothing and sunscreen. Do not use sun lamps, tanning beds, or tanning booths. Talk to your care team if you or your partner wish to become pregnant or think you might be pregnant. This medication can cause serious birth defects if taken during pregnancy and for 3 months after the last dose. A reliable form of contraception is recommended while taking this medication and for 3 months after the last dose. Talk to your care team about effective forms of contraception. Do not father a child while taking this medication and for 3 months after the last dose. Use a condom while having sex during this time period. Do not breastfeed while taking this medication. This medication may cause infertility. Talk to your care team if you are concerned about your fertility. What side effects may I notice from receiving this medication? Side effects that you should report to your care team as soon as possible: Allergic reactions--skin rash, itching, hives, swelling of the face, lips,  tongue, or throat Heart attack--pain or tightness in the chest, shoulders, arms, or jaw, nausea, shortness of breath, cold or clammy skin, feeling faint or lightheaded Heart failure--shortness of breath, swelling of the ankles, feet, or hands, sudden weight gain, unusual weakness or fatigue Heart rhythm changes--fast or irregular heartbeat, dizziness, feeling faint or lightheaded, chest pain, trouble breathing High ammonia level--unusual weakness or fatigue, confusion, loss of appetite, nausea, vomiting, seizures Infection--fever, chills, cough, sore throat, wounds that don't heal, pain or trouble when passing urine, general feeling of discomfort or being unwell Low red blood cell level--unusual weakness or fatigue, dizziness, headache, trouble breathing Pain, tingling, or numbness in the hands or feet, muscle weakness, change in vision, confusion or  trouble speaking, loss of balance or coordination, trouble walking, seizures Redness, swelling, and blistering of the skin over hands and feet Severe or prolonged diarrhea Unusual bruising or bleeding Side effects that usually do not require medical attention (report to your care team if they continue or are bothersome): Dry skin Headache Increased tears Nausea Pain, redness, or swelling with sores inside the mouth or throat Sensitivity to light Vomiting This list may not describe all possible side effects. Call your doctor for medical advice about side effects. You may report side effects to FDA at 1-800-FDA-1088. Where should I keep my medication? This medication is given in a hospital or clinic. It will not be stored at home. NOTE: This sheet is a summary. It may not cover all possible information. If you have questions about this medicine, talk to your doctor, pharmacist, or health care provider.  2023 Elsevier/Gold Standard (2021-07-14 00:00:00) Ferumoxytol Injection What is this medication? FERUMOXYTOL (FER ue MOX i tol) treats low  levels of iron in your body (iron deficiency anemia). Iron is a mineral that plays an important role in making red blood cells, which carry oxygen from your lungs to the rest of your body. This medicine may be used for other purposes; ask your health care provider or pharmacist if you have questions. COMMON BRAND NAME(S): Feraheme What should I tell my care team before I take this medication? They need to know if you have any of these conditions: Anemia not caused by low iron levels High levels of iron in the blood Magnetic resonance imaging (MRI) test scheduled An unusual or allergic reaction to iron, other medications, foods, dyes, or preservatives Pregnant or trying to get pregnant Breast-feeding How should I use this medication? This medication is for injection into a vein. It is given in a hospital or clinic setting. Talk to your care team the use of this medication in children. Special care may be needed. Overdosage: If you think you have taken too much of this medicine contact a poison control center or emergency room at once. NOTE: This medicine is only for you. Do not share this medicine with others. What if I miss a dose? It is important not to miss your dose. Call your care team if you are unable to keep an appointment. What may interact with this medication? Other iron products This list may not describe all possible interactions. Give your health care provider a list of all the medicines, herbs, non-prescription drugs, or dietary supplements you use. Also tell them if you smoke, drink alcohol, or use illegal drugs. Some items may interact with your medicine. What should I watch for while using this medication? Visit your care team regularly. Tell your care team if your symptoms do not start to get better or if they get worse. You may need blood work done while you are taking this medication. You may need to follow a special diet. Talk to your care team. Foods that contain iron  include: whole grains/cereals, dried fruits, beans, or peas, leafy green vegetables, and organ meats (liver, kidney). What side effects may I notice from receiving this medication? Side effects that you should report to your care team as soon as possible: Allergic reactions--skin rash, itching, hives, swelling of the face, lips, tongue, or throat Low blood pressure--dizziness, feeling faint or lightheaded, blurry vision Shortness of breath Side effects that usually do not require medical attention (report to your care team if they continue or are bothersome): Flushing Headache Joint pain Muscle pain  Nausea Pain, redness, or irritation at injection site This list may not describe all possible side effects. Call your doctor for medical advice about side effects. You may report side effects to FDA at 1-800-FDA-1088. Where should I keep my medication? This medication is given in a hospital or clinic and will not be stored at home. NOTE: This sheet is a summary. It may not cover all possible information. If you have questions about this medicine, talk to your doctor, pharmacist, or health care provider.  2023 Elsevier/Gold Standard (2020-08-01 00:00:00)

## 2021-10-23 ENCOUNTER — Telehealth: Payer: Self-pay | Admitting: Hematology

## 2021-10-23 NOTE — Telephone Encounter (Signed)
Scheduled follow-up appointments per 8/3 los. Patient is aware. 

## 2021-10-24 ENCOUNTER — Inpatient Hospital Stay: Payer: Medicare Other

## 2021-10-24 ENCOUNTER — Other Ambulatory Visit: Payer: Self-pay

## 2021-10-24 VITALS — BP 125/62 | HR 75 | Temp 98.2°F

## 2021-10-24 DIAGNOSIS — Z5111 Encounter for antineoplastic chemotherapy: Secondary | ICD-10-CM | POA: Diagnosis not present

## 2021-10-24 DIAGNOSIS — C182 Malignant neoplasm of ascending colon: Secondary | ICD-10-CM

## 2021-10-24 DIAGNOSIS — Z9049 Acquired absence of other specified parts of digestive tract: Secondary | ICD-10-CM | POA: Diagnosis not present

## 2021-10-24 DIAGNOSIS — D509 Iron deficiency anemia, unspecified: Secondary | ICD-10-CM | POA: Diagnosis not present

## 2021-10-24 MED ORDER — HEPARIN SOD (PORK) LOCK FLUSH 100 UNIT/ML IV SOLN
500.0000 [IU] | Freq: Once | INTRAVENOUS | Status: AC | PRN
  Administered 2021-10-24: 500 [IU]

## 2021-10-24 MED ORDER — SODIUM CHLORIDE 0.9% FLUSH
10.0000 mL | INTRAVENOUS | Status: DC | PRN
  Administered 2021-10-24: 10 mL

## 2021-10-27 NOTE — Progress Notes (Signed)
Radiation Oncology         [phone] ________________________________  Name: Joseph Pennington        MRN: [id number]  Date of Service: 10/28/2021 DOB: 12-02-1931  IO:[id number], Joseph Saxon, MD  Ileana Roup, MD     REFERRING PHYSICIAN: Ileana Roup, MD   DIAGNOSIS: The primary encounter diagnosis was Ascending colon cancer s/p lap colectomy 09/14/2021. A diagnosis of Primary adenocarcinoma of ascending colon (Joseph Pennington) was also pertinent to this visit.   HISTORY OF PRESENT ILLNESS: Joseph Pennington is a 88 y.o. male seen at the request of Dr. Dema Severin for an adenocarcinoma of the ascending colon.  The patient was found to have an ascending colon cancer during colonoscopy evaluation in May 2023.  At that time a frond-like villous fungating and ulcerated partially obstructing mass was seen in the ascending colon encompassing the entire cecum.  The mass was circumferential and measured 15 cm in length.  CT imaging showed the masslike changes in the ascending colon extending to the level of the hepatic flexure and right lower quadrant with associated pericolonic fat stranding and a few prominent lymph nodes.  No evidence of distant disease was appreciated.  His symptoms at that time had been abdominal discomfort and 15 pound weight loss in the last year.  With these findings he was counseled on the rationale for surgical resection and underwent laparoscopic with conversion to open right hemicolectomy and repair of small bowel serosal x2 on 09/14/2021.  Final pathology revealed poorly differentiated adenocarcinoma in the ascending colon and wall margins were considered clear, his case has been discussed in multidisciplinary GI oncology conference there was a defect in the colon along the middle of the tumor at the time of the surgery where Dr. Dema Severin could see within the lumen of the colon this overlies the right kidney and lateral most wall of the duodenum and grossly was felt to be a positive margin.   There were tumor deposits noted and 22 sampled nodes were negative. Pathologically his tumor deposits are felt to stage this as N1 disease. Given these concerns about his radial margin, it is felt he has a high risk for local recurrence, he is seen to discuss adjuvant radiotherapy to try and improve local control.  He is currently in the midst of starting FOLFOX in the adjuvant setting.  The plan is to proceed with this for 3 months followed by chemoradiation.  He is seen today to discuss this course.    PREVIOUS RADIATION THERAPY: No   PAST MEDICAL HISTORY:  Past Medical History:  Diagnosis Date   Acute cholecystitis 02/19/2018   Anemia    Chronic kidney disease    Colon cancer ([place])    Diabetes mellitus without complication (HCC)    diet controlled   GERD (gastroesophageal reflux disease)    History of blood transfusion 08/2021   History of hiatal hernia    Hypertension    Hypothyroidism    Iron deficiency anemia    Peripheral neuropathy    bilateral feet       PAST SURGICAL HISTORY: Past Surgical History:  Procedure Laterality Date   CHOLECYSTECTOMY N/A 02/20/2018   Procedure: LAPAROSCOPIC CHOLECYSTECTOMY;  Surgeon: Excell Seltzer, MD;  Location: [facility];  Service: General;  Laterality: N/A;   COLONOSCOPY  2023   LAPAROSCOPIC RIGHT HEMI COLECTOMY Right 09/14/2021   Procedure: LAPAROSCOPIC TO OPEN RIGHT HEMI COLECTOMY;  Surgeon: Ileana Roup, MD;  Location: [facility];  Service: General;  Laterality: Right;  PORTACATH PLACEMENT N/A 10/15/2021   Procedure: INSERTION PORT-A-CATH;  Surgeon: Ileana Roup, MD;  Location: [facility];  Service: General;  Laterality: N/A;  with ultrasound and flouroscopic guidance     FAMILY HISTORY:  Family History  Problem Relation Age of Onset   Rectal cancer Mother    Heart attack Father      SOCIAL HISTORY:  reports that he quit smoking about 43 years ago. His smoking use included cigarettes. He has a 20.00 pack-year smoking  history. He has never used smokeless tobacco. He reports that he does not currently use alcohol. He reports that he does not currently use drugs.  The patient is married and lives in [place]. He's accompanied by his son. 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   ALLERGIES: Patient has no known allergies.   MEDICATIONS:  Current Outpatient Medications  Medication Sig Dispense Refill   acetaminophen (TYLENOL) 500 MG tablet Take 500-1,000 mg by mouth every 6 (six) hours as needed for moderate pain.     ARTIFICIAL TEAR SOLUTION OP Place 1 drop into both eyes daily as needed (dry eyes).     cyanocobalamin (,VITAMIN B-12,) 1000 MCG/ML injection Inject 1,000 mcg into the muscle every 30 (thirty) days.   0   Iron, Ferrous Sulfate, 325 (65 Fe) MG TABS Take 325 mg by mouth daily.     levothyroxine (SYNTHROID) 75 MCG tablet Take 75 mcg by mouth daily before breakfast.     lidocaine-prilocaine (EMLA) cream Apply to affected area once 30 g 3   lisinopril (PRINIVIL,ZESTRIL) 20 MG tablet Take 20 mg by mouth daily.  3   omeprazole (PRILOSEC) 40 MG capsule Take 1 capsule (40 mg total) by mouth daily. 30 minutes before meals (Patient not taking: Reported on 10/28/2021) 30 capsule 3   ondansetron (ZOFRAN) 8 MG tablet Take 1 tablet (8 mg total) by mouth 2 (two) times daily as needed for refractory nausea / vomiting. Start on day 3 after chemotherapy. 30 tablet 1   polyethylene glycol powder (GLYCOLAX/MIRALAX) 17 GM/SCOOP powder Take 17 g by mouth as needed for mild constipation. (Patient taking differently: Take 17 g by mouth daily as needed for mild constipation.)     prochlorperazine (COMPAZINE) 10 MG tablet Take 1 tablet (10 mg total) by mouth every 6 (six) hours as needed (Nausea or vomiting). 30 tablet 1   traMADol (ULTRAM) 50 MG tablet Take 1-2 tablets (50-100 mg total) by mouth every 6 (six) hours as needed (tylenol first). (Patient not taking: Reported on 10/28/2021) 10  tablet 0   vitamin C (ASCORBIC ACID) 500 MG tablet Take 500 mg by mouth daily.     No current facility-administered medications for this encounter.     REVIEW OF SYSTEMS: On review of systems, the patient reports that he is doing okay overall. He has some RUQ fullness and reports he has been constipated a few days and using antiemetics for some nausea since chemo, it appears he's been taking Zofran and had Aloxi during infusion. No other complaints are verbalized.      PHYSICAL EXAM:  Wt Readings from Last 3 Encounters:  10/28/21 203 lb 6.4 oz (92.3 kg)  10/22/21 204 lb 12.8 oz (92.9 kg)  10/14/21 195 lb (88.5 kg)   Temp Readings from Last 3 Encounters:  10/28/21 97.7 F (36.5 C)  10/24/21 98.2 F (36.8 C) (Oral)  10/22/21 97.9 F (36.6 C) (Oral)   BP Readings from Last 3 Encounters:  10/28/21 122/60  10/24/21 125/62  10/22/21 (!) 143/79   Pulse Readings from Last 3 Encounters:  10/28/21 89  10/24/21 75  10/22/21 81   Pain Assessment Pain Score: 3 /10  In general this is a well appearing Caucasian male in no acute distress.  He's alert and oriented x4 and appropriate throughout the examination. Cardiopulmonary assessment is negative for acute distress and he exhibits normal effort. Abdomen is soft, nontender, nondistended. Incision sites are well healed. His abdomen is tympanic to percussion in all quadrants.     ECOG = 1  0 - Asymptomatic (Fully active, able to carry on all predisease activities without restriction)  1 - Symptomatic but completely ambulatory (Restricted in physically strenuous activity but ambulatory and able to carry out work of a light or sedentary nature. For example, light housework, office work)  2 - Symptomatic, <50% in bed during the day (Ambulatory and capable of all self care but unable to carry out any work activities. Up and about more than 50% of waking hours)  3 - Symptomatic, >50% in bed, but not bedbound (Capable of only limited  self-care, confined to bed or chair 50% or more of waking hours)  4 - Bedbound (Completely disabled. Cannot carry on any self-care. Totally confined to bed or chair)  5 - Death   Eustace Pen MM, Creech RH, Tormey DC, et al. [id number]). "Toxicity and response criteria of the [facility]". [place] Oncol. 5 (6): 649-55    LABORATORY DATA:  Lab Results  Component Value Date   WBC 6.4 10/22/2021   HGB 9.2 (L) 10/22/2021   HCT 29.0 (L) 10/22/2021   MCV 84.3 10/22/2021   PLT 220 10/22/2021   Lab Results  Component Value Date   NA 137 10/22/2021   K 4.0 10/22/2021   CL 105 10/22/2021   CO2 26 10/22/2021   Lab Results  Component Value Date   ALT 10 10/22/2021   AST 13 (L) 10/22/2021   ALKPHOS 67 10/22/2021   BILITOT 0.4 10/22/2021      RADIOGRAPHY: DG Chest 1 View  Result Date: 10/16/2021 CLINICAL DATA:  Colorectal carcinoma.  Port-A-Cath placement. EXAM: CHEST  1 VIEW COMPARISON:  10/15/2021 FINDINGS: Right-sided Port-A-Cath is seen with tip overlying the mid right atrium. No evidence of pneumothorax or pleural effusion. Both lungs are clear. Heart size is normal. Aortic atherosclerotic calcification incidentally noted. Incidental note is made of bilateral nipple shadows. IMPRESSION: Right-sided Port-A-Cath tip overlies the mid right atrium. No active disease. Electronically Signed   By: Marlaine Hind M.D.   On: 10/16/2021 11:09   DG CHEST PORT 1 VIEW  Result Date: 10/15/2021 CLINICAL DATA:  88 year old male Port-A-Cath placement. Colon cancer. EXAM: PORTABLE CHEST 1 VIEW COMPARISON:  Portable chest 09/13/2021 and earlier. FINDINGS: Portable AP semi upright view at 1027 hours. Right chest IJ approach power port in place. The catheter tip however seems to project at the lower right atrium level. No other adverse features. Mildly lower lung volumes than on previous exams. Mediastinal contours are within normal limits. Visualized tracheal air column is within normal  limits. Allowing for portable technique the lungs are clear. No acute osseous abnormality identified. IMPRESSION: 1. Right chest power port placed, tip projects at the lower right atrium on this exam with mildly lower lung volumes than baseline. 2. No acute cardiopulmonary abnormality. Electronically Signed   By: Genevie Ann M.D.   On: 10/15/2021 10:37   DG C-Arm 1-60 Min-No Report  Result  Date: 10/15/2021 Fluoroscopy was utilized by the requesting physician.  No radiographic interpretation.       IMPRESSION/PLAN: 1. 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 Dr. Lisbeth Renshaw discusses the pathology findings and reviews the nature of colon carcinoma. His gross findings at the time of surgery put him at risk of local recurrence. Again his case has been discussed in multidisciplinary GI oncology conference, and Dr. Lisbeth Renshaw recommends chemoradiation following his plans for adjuvant FOLFOX.  We discussed the risks, benefits, short, and long term effects of radiotherapy, as well as the curative intent, and the patient is interested in proceeding. Dr. Lisbeth Renshaw discusses the delivery and logistics of radiotherapy and anticipates a course of 5 1/2 weeks of radiotherapy to the right upper abdomen and would recommend chemosensitization. Written consent is obtained and placed in the chart, a copy was provided to the patient. The patient will be contacted to coordinate treatment planning by our simulation department and we anticipate starting radiation the week of 02/01/22 based on his current chemotherapy dates.  2. Constipation. I suspect this is from his antiemetics. I'll message Dr. Dema Severin but I would suspect that miralax would be an appropriate cathartic for him given the time since surgery.  In a visit lasting 60 minutes, greater than 50% of the time was spent face to face discussing the patient's condition, in  preparation for the discussion, and coordinating the patient's care.   The above documentation reflects my direct findings during this shared patient visit. Please see the separate note by Dr. Lisbeth Renshaw on this date for the remainder of the patient's plan of care.    Carola Rhine, [facility]   **Disclaimer: This note was dictated with voice recognition software. Similar sounding words can inadvertently be transcribed and this note may contain transcription errors which may not have been corrected upon publication of note.**

## 2021-10-28 ENCOUNTER — Other Ambulatory Visit: Payer: Self-pay

## 2021-10-28 ENCOUNTER — Ambulatory Visit
Admission: RE | Admit: 2021-10-28 | Discharge: 2021-10-28 | Disposition: A | Discharge status: Home / Self Care | Payer: Medicare Other | Source: Ambulatory Visit | Attending: Radiation Oncology | Admitting: Radiation Oncology

## 2021-10-28 ENCOUNTER — Encounter: Payer: Self-pay | Admitting: Radiation Oncology

## 2021-10-28 VITALS — BP 122/60 | HR 89 | Temp 97.7°F | Resp 20 | Ht 73.0 in | Wt 203.4 lb

## 2021-10-28 DIAGNOSIS — D509 Iron deficiency anemia, unspecified: Secondary | ICD-10-CM | POA: Insufficient documentation

## 2021-10-28 DIAGNOSIS — N189 Chronic kidney disease, unspecified: Secondary | ICD-10-CM | POA: Insufficient documentation

## 2021-10-28 DIAGNOSIS — K5900 Constipation, unspecified: Secondary | ICD-10-CM | POA: Diagnosis not present

## 2021-10-28 DIAGNOSIS — K219 Gastro-esophageal reflux disease without esophagitis: Secondary | ICD-10-CM | POA: Insufficient documentation

## 2021-10-28 DIAGNOSIS — C182 Malignant neoplasm of ascending colon: Secondary | ICD-10-CM | POA: Diagnosis not present

## 2021-10-28 DIAGNOSIS — E1122 Type 2 diabetes mellitus with diabetic chronic kidney disease: Secondary | ICD-10-CM | POA: Insufficient documentation

## 2021-10-28 DIAGNOSIS — Z87891 Personal history of nicotine dependence: Secondary | ICD-10-CM | POA: Insufficient documentation

## 2021-10-28 DIAGNOSIS — Z79899 Other long term (current) drug therapy: Secondary | ICD-10-CM | POA: Diagnosis not present

## 2021-10-28 DIAGNOSIS — I129 Hypertensive chronic kidney disease with stage 1 through stage 4 chronic kidney disease, or unspecified chronic kidney disease: Secondary | ICD-10-CM | POA: Insufficient documentation

## 2021-10-28 DIAGNOSIS — Z79890 Hormone replacement therapy (postmenopausal): Secondary | ICD-10-CM | POA: Diagnosis not present

## 2021-10-28 DIAGNOSIS — E039 Hypothyroidism, unspecified: Secondary | ICD-10-CM | POA: Diagnosis not present

## 2021-10-28 NOTE — Progress Notes (Signed)
GI Location of Tumor / Histology: Colon Cancer-Ascending  Joseph Pennington presented with abdominal discomfort and 15 pound weight loss within the last year.  CT CAP 07/30/2021: masslike changes in the ascending colon extending to the level of the hepatic flexure and right lower quadrant with associated pericolonic fat stranding and a few prominent lymph nodes.  No evidence of distant disease was appreciated.   Colonoscopy 07/23/2021: frond-like villous fungating and ulcerated partially obstructing mass was seen in the ascending colon encompassing the entire cecum.  The mass was circumferential and measured 15 cm in length.       Biopsies of Colon Mass 09/14/2021      Past/Anticipated interventions by surgeon, if any:  Dr. Dema Severin -Laparoscopic to open right hemi colectomy 09/14/2021   Past/Anticipated interventions by medical oncology, if any:  Dr. Burr Medico / NP Lanora Manis 10/22/2021 -FOLFOX started 10/22/2021, q14 days x 3 months    Weight changes, if any: He reports some weight loss prior to his surgery, roughly 12-15 pounds.  Since his surgery he has gained some back.   Bowel/Bladder complaints, if any: He reports it has been a couple of days, he will start taking his miralax.  Denies bladder changes.   Nausea / Vomiting, if any: He reports feeling a little nausea on occasion and takes Zofran.  Pain issues, if any:  No   Any blood per rectum: None    SAFETY ISSUES: Prior radiation? No Pacemaker/ICD? No Possible current pregnancy? N/a Is the patient on methotrexate? No  Current Complaints/Details:

## 2021-10-29 ENCOUNTER — Inpatient Hospital Stay (HOSPITAL_BASED_OUTPATIENT_CLINIC_OR_DEPARTMENT_OTHER): Payer: Medicare Other | Admitting: Hematology

## 2021-10-29 ENCOUNTER — Inpatient Hospital Stay: Payer: Medicare Other

## 2021-10-29 ENCOUNTER — Encounter: Payer: Self-pay | Admitting: Hematology

## 2021-10-29 ENCOUNTER — Telehealth: Payer: Self-pay | Admitting: *Deleted

## 2021-10-29 VITALS — BP 129/68 | HR 86 | Temp 98.8°F | Resp 15 | Wt 201.5 lb

## 2021-10-29 DIAGNOSIS — C182 Malignant neoplasm of ascending colon: Secondary | ICD-10-CM | POA: Diagnosis not present

## 2021-10-29 DIAGNOSIS — Z95828 Presence of other vascular implants and grafts: Secondary | ICD-10-CM

## 2021-10-29 DIAGNOSIS — Z9049 Acquired absence of other specified parts of digestive tract: Secondary | ICD-10-CM | POA: Diagnosis not present

## 2021-10-29 DIAGNOSIS — D509 Iron deficiency anemia, unspecified: Secondary | ICD-10-CM | POA: Diagnosis not present

## 2021-10-29 DIAGNOSIS — Z5111 Encounter for antineoplastic chemotherapy: Secondary | ICD-10-CM | POA: Diagnosis not present

## 2021-10-29 LAB — CMP (CANCER CENTER ONLY)
ALT: 10 U/L (ref 0–44)
AST: 13 U/L — ABNORMAL LOW (ref 15–41)
Albumin: 3.6 g/dL (ref 3.5–5.0)
Alkaline Phosphatase: 64 U/L (ref 38–126)
Anion gap: 5 (ref 5–15)
BUN: 17 mg/dL (ref 8–23)
CO2: 27 mmol/L (ref 22–32)
Calcium: 9.3 mg/dL (ref 8.9–10.3)
Chloride: 101 mmol/L (ref 98–111)
Creatinine: 1.1 mg/dL (ref 0.61–1.24)
GFR, Estimated: >60 mL/min
Glucose, Bld: 161 mg/dL — ABNORMAL HIGH (ref 70–99)
Potassium: 4 mmol/L (ref 3.5–5.1)
Sodium: 133 mmol/L — ABNORMAL LOW (ref 135–145)
Total Bilirubin: 1 mg/dL (ref 0.3–1.2)
Total Protein: 7.1 g/dL (ref 6.5–8.1)

## 2021-10-29 LAB — CBC WITH DIFFERENTIAL (CANCER CENTER ONLY)
Abs Immature Granulocytes: 0.06 10*3/uL (ref 0.00–0.07)
Basophils Absolute: 0 10*3/uL (ref 0.0–0.1)
Basophils Relative: 0 %
Eosinophils Absolute: 0 10*3/uL (ref 0.0–0.5)
Eosinophils Relative: 0 %
HCT: 27 % — ABNORMAL LOW (ref 39.0–52.0)
Hemoglobin: 8.9 g/dL — ABNORMAL LOW (ref 13.0–17.0)
Immature Granulocytes: 1 %
Lymphocytes Relative: 8 %
Lymphs Abs: 0.7 10*3/uL (ref 0.7–4.0)
MCH: 27.4 pg (ref 26.0–34.0)
MCHC: 33 g/dL (ref 30.0–36.0)
MCV: 83.1 fL (ref 80.0–100.0)
Monocytes Absolute: 0.7 10*3/uL (ref 0.1–1.0)
Monocytes Relative: 8 %
Neutro Abs: 8 10*3/uL — ABNORMAL HIGH (ref 1.7–7.7)
Neutrophils Relative %: 83 %
Platelet Count: 175 10*3/uL (ref 150–400)
RBC: 3.25 MIL/uL — ABNORMAL LOW (ref 4.22–5.81)
RDW: 17.6 % — ABNORMAL HIGH (ref 11.5–15.5)
WBC Count: 9.6 10*3/uL (ref 4.0–10.5)
nRBC: 0 % (ref 0.0–0.2)

## 2021-10-29 MED ORDER — HEPARIN SOD (PORK) LOCK FLUSH 100 UNIT/ML IV SOLN
500.0000 [IU] | INTRAVENOUS | Status: AC | PRN
  Administered 2021-10-29: 500 [IU]

## 2021-10-29 MED ORDER — SODIUM CHLORIDE 0.9% FLUSH
10.0000 mL | INTRAVENOUS | Status: AC | PRN
  Administered 2021-10-29: 10 mL

## 2021-10-29 NOTE — Progress Notes (Signed)
[place]   [phone] [phone]   Clinic Follow up Note   Patient Care Team: Joseph Frees, MD as PCP - General (Family Medicine)  Date of Service:  10/29/2021  CHIEF COMPLAINT: f/u of colon cancer  CURRENT THERAPY:  Adjuvant FOLFOX, q14d, starting 10/22/21  ASSESSMENT & PLAN:  Joseph Pennington is a 88 y.o. male with   1. 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 Colonoscopy 07/23/21 showed 15 cm partially obstructing mass, path confirmed adenocarcinoma -staging CT CAP 07/30/21 showed: prominent but not pathologically enlarged local LN's; indeterminate groud glass opacity in right lung base; no distant mets.  -Baseline CEA was normal, 1.35 (08/05/21) -s/p open right colectomy by Dr. Dema Pennington 09/14/21, surgical path shows 9 cm poorly differentiated adenocarcinoma in ascending colon extending through muscular wall, lymph nodes negative. Dr. Dema Pennington notes part of the tumor was wide open resulting in a defect in the colon in an area overlying the right kidney and lateral most part of the duodenum, consistent with a positive margin. -Molecular features showed loss of MLH1 and PMS2 with BRAF mutation and MLH1 hyper methylation overall indicative of a sporadic cancer rather than a germline/Lynch syndrome associated cancer. -he began adjuvant FOLFOX on 10/22/21. He tolerated well overall. -He reports intermittent right-sided abdominal pain, exam showed a palpable mass in the right abdomen, will monitor closely, if persists, may consider repeat CT scan soon.   2. Right upper quadrant pain, diarrhea, low appetite, weight loss -Secondary to #1 -RUQ pain was intermittent initially but constant now, mild unless he overeats.  -diarrhea usually 2/day. He knows to take imodium if this  increases. He appears hydrated.  -Pain resolved after surgery.  Appetite has much improved and he is gaining weight.  Continue supplements as needed -He reports normal BMs   3. 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 C/w mild IDA  -he began oral B12 and oral iron in 05/2021.  -He received IV Feraheme and 1 unit of blood while hospitalized for surgery -Anemia remains mild to moderate, Hgb stable in 9 range, we will give Feraheme again with cycle 2 of FOLFOX   Hypothyroidism, HTN, pre-DM, GERD, CKD -on lisinopril and synthroid. Does not take many medications -SCr up to 2.2 in 2019 but has normalized since then -per PCP    5. Social  -Joseph Pennington is the primary care giver for his wife who has Parkinson's. They live in a 2 story home and have a farm house that is ADA approved -pt is independent with ADLs.  -he has 2 adult children who live locally and have been strong support system during this time      Plan: -lab, flush, and f/u on 8/16 -C2 FOLFOX with IV Feraheme 8/17    No problem-specific Assessment & Plan notes found for this encounter.   SUMMARY OF ONCOLOGIC HISTORY: Oncology History  Ascending colon cancer s/p lap colectomy 09/14/2021  07/23/2021 Procedure   Upper GI Endoscopy/Colonoscopy; Dr. Lorenso Pennington  Colonoscopy Impression: - Likely malignant partially obstructing tumor in the ascending colon. Biopsied. - Six 3 to 8 mm polyps in the rectum and in the transverse colon, removed with  a cold snare. Resected and retrieved. - Non-bleeding internal hemorrhoids.  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 Biopsied. - Multiple gastric polyps. Resected and retrieved. - Erythematous mucosa in the antrum. Biopsied. - Mucosal  nodule found in the duodenum. Biopsied. - Dilated lacteals were found in the duodenum. Biopsied. - Two non-bleeding angioectasias in the duodenum.   07/23/2021 Initial Biopsy   Diagnosis 1. Duodenum, Biopsy - BENIGN DUODENAL MUCOSA - NO ACUTE INFLAMMATION, VILLOUS BLUNTING OR INCREASED INTRAEPITHELIAL LYMPHOCYTES IDENTIFIED 2. Duodenum, Biopsy, Nodule - PEPTIC DUODENITIS - NO DYSPLASIA OR MALIGNANCY IDENTIFIED 3. Stomach, biopsy - MILD CHRONIC GASTRITIS WITHOUT ACTIVITY - NO INTESTINAL METAPLASIA IDENTIFIED - SEE COMMENT 4. Stomach, polyp(s) - HYPERPLASTIC GASTRIC POLYP(S) WITH INTESTINAL METAPLASIA - NO H. PYLORI OR MALIGNANCY IDENTIFIED 5. Esophagogastric junction, biopsy - SQUAMOCOLUMNAR JUNCTION WITH CHRONIC INFLAMMATION - NO INTESTINAL METAPLASIA, DYSPLASIA OR MALIGNANCY IDENTIFIED 6. Rectum, biopsy, and transverse - MULTIPLE FRAGMENTS OF HYPERPLASTIC POLYP(S) - NO HIGH-GRADE DYSPLASIA OR MALIGNANCY IDENTIFIED 7. Ascending Colon Biopsy, Mass - POORLY DIFFERENTIATED ADENOCARCINOMA - SEE COMMENT  Microscopic Comment 7. By immunohistochemistry, the neoplastic cells are positive for CDX2 (patchy, weak) but negative for p63, p40, CD56, chromogranin and synaptophysin. This immunoprofile is consistent with a poorly differentiated adenocarcinoma.  3. H. pylori immunohistochemistry is POSITIVE for RARE microorganisms.   07/30/2021 Imaging   EXAM: CT CHEST, ABDOMEN, AND PELVIS WITH CONTRAST  IMPRESSION: 1. Masslike region in the ascending colon extending to the level of the hepatic flexure in the right lower quadrant, concerning for malignancy. Associated pericolonic fat stranding and a few prominent lymph nodes are seen adjacent to this segment of colon, suspicious for local infiltration. No distant metastasis is seen. 2. Left inguinal hernia containing nonobstructed colon. 3. Nonspecific small ground-glass opacity in the in the right lower lobe. Attention on follow-up is  recommended. 4. Small hiatal hernia. 5. Aortic atherosclerosis and coronary artery calcifications   08/06/2021 Initial Diagnosis   Malignant neoplasm of ascending colon [facility])   Primary adenocarcinoma of ascending colon ([place])  07/23/2021 Procedure   Colonoscopy by Dr. Lorenso Pennington - A frond-like/villous, fungating and ulcerated partially obstructing large mass was found in the ascending colon. There was a point at which the mass could not be bypassed, even with use of an ultraslim colonoscope. It is suspected that this mass encompasses the entire cecum and ascending colon. The mass was circumferential. The mass measured fifteen cm in length. Oozing was present.    07/23/2021 Initial Biopsy   3. H. pylori immunohistochemistry is POSITIVE for RARE microorganisms.  FINAL DIAGNOSIS Diagnosis 1. Duodenum, Biopsy - BENIGN DUODENAL MUCOSA - NO ACUTE INFLAMMATION, VILLOUS BLUNTING OR INCREASED INTRAEPITHELIAL LYMPHOCYTES IDENTIFIED 2. Duodenum, Biopsy, Nodule - PEPTIC DUODENITIS - NO DYSPLASIA OR MALIGNANCY IDENTIFIED 3. Stomach, biopsy - MILD CHRONIC GASTRITIS WITHOUT ACTIVITY - NO INTESTINAL METAPLASIA IDENTIFIED - SEE COMMENT 4. Stomach, polyp(s) - HYPERPLASTIC GASTRIC POLYP(S) WITH INTESTINAL METAPLASIA - NO H. PYLORI OR MALIGNANCY IDENTIFIED 5. Esophagogastric junction, biopsy - SQUAMOCOLUMNAR JUNCTION WITH CHRONIC INFLAMMATION - NO INTESTINAL METAPLASIA, DYSPLASIA OR MALIGNANCY IDENTIFIED 6. Rectum, biopsy, and transverse - MULTIPLE FRAGMENTS OF HYPERPLASTIC POLYP(S) - NO HIGH-GRADE DYSPLASIA OR MALIGNANCY IDENTIFIED 7. Ascending Colon Biopsy, Mass - POORLY DIFFERENTIATED ADENOCARCINOMA - SEE COMMENT   07/30/2021 Imaging   CT CAP IMPRESSION: 1. Masslike region in the ascending colon extending to the level of the hepatic flexure in the right lower quadrant, concerning for malignancy. Associated pericolonic fat stranding and a few prominent lymph nodes are seen  adjacent to this segment  of colon, suspicious for local infiltration. No distant metastasis is seen. 2. Left inguinal hernia containing nonobstructed colon. 3. Nonspecific small ground-glass opacity in the in the right lower lobe. Attention on follow-up is recommended. 4. Small hiatal hernia. 5. Aortic atherosclerosis and coronary artery calcifications   09/14/2021 Surgery   PROCEDURE:  Laparoscopic converted to open right hemicolectomy Repair of small bowel serosa x 2     09/14/2021 Pathology Results   A. COLON, RIGHT, HEMI COLECTOMY:  Poorly differentiated adenocarcinoma in the ascending colon with clear margins of resection.  Tumor Size: Circumferential measuring 9.0 cm X 8.0 cm. X 2.5 cm.  Macroscopic Tumor Perforation: Not identified  Histologic Type: Adenocarcinoma.  Histologic Grade: Grade 3, poorly differentiated.  Multiple Primary Sites: Not applicable  Tumor Extension: Extends through the muscular wall into the pericolonic adipose tissue.  Lymphovascular Invasion: Not identified.  Perineural Invasion: Not identified.  Treatment Effect: No known presurgical therapy.  Margins:       Margin Status for Invasive Carcinoma: All margins are negative for invasive carcinoma       Distance from Invasive Carcinoma to proximal margin: 11 cm.       Distance from invasive carcinoma to distal margin: 12 cm.  Regional Lymph Nodes:       Number of Lymph Nodes with Tumor: None.       Number of Lymph Nodes Examined: Twenty-two (22)  Tumor Deposits: Present (slide A11)  Distant Metastasis:       Distant Site(s) Involved: None known.  Pathologic Stage Classification (pTNM, AJCC 8th Edition): pT3 pN1c  Comments: Omentum is free of neoplastic invasion.                      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LAMN)    09/14/2021 Cancer Staging   Staging form: Colon and Rectum, AJCC 8th Edition - Pathologic stage from 09/14/2021: Stage IIIB (pT3, pN1c, cM0) - Signed by Alla Feeling, NP on 10/08/2021 Stage  prefix: Initial diagnosis Total positive nodes: 0 Histologic grading system: 4 grade system Histologic grade (G): G3 Laterality: Right Lymph-vascular invasion (LVI): LVI not present (absent)/not identified Tumor deposits (TD): Present Perineural invasion (PNI): Absent Microsatellite instability (MSI): Unstable high BRAF mutation: Positive   10/08/2021 Initial Diagnosis   Primary adenocarcinoma of ascending colon ([place])   10/22/2021 -  Chemotherapy   Patient is on Treatment Plan : COLORECTAL FOLFOX q14d x 3 months        INTERVAL HISTORY:  Joseph Pennington is here for a follow up of colon cancer. He was last seen by NP Lacie on 10/22/21. He presents to the clinic accompanied by his daughter. He reports he did well with his first cycle of chemo.   All other systems were reviewed with the patient and are negative.  MEDICAL HISTORY:  Past Medical History:  Diagnosis Date   Acute cholecystitis 02/19/2018   Anemia    Chronic kidney disease    Colon cancer ([place])    Diabetes mellitus without complication (HCC)    diet controlled   GERD (gastroesophageal reflux disease)    History of blood transfusion 08/2021   History of hiatal hernia    Hypertension    Hypothyroidism    Iron deficiency anemia    Peripheral neuropathy    bilateral feet    SURGICAL HISTORY: Past Surgical History:  Procedure Laterality Date   CHOLECYSTECTOMY N/A 02/20/2018   Procedure: LAPAROSCOPIC CHOLECYSTECTOMY;  Surgeon: Excell Seltzer, MD;  Location: [facility];  Service: General;  Laterality: N/A;   COLONOSCOPY  2023   LAPAROSCOPIC RIGHT HEMI COLECTOMY Right 09/14/2021   Procedure: LAPAROSCOPIC TO OPEN RIGHT HEMI COLECTOMY;  Surgeon: Andria Meuse, MD;  Location: [facility];  Service: General;  Laterality: Right;   PORTACATH PLACEMENT N/A 10/15/2021   Procedure: INSERTION PORT-A-CATH;  Surgeon: Andria Meuse, MD;  Location: [facility];  Service: General;  Laterality: N/A;  with ultrasound and  flouroscopic guidance    I have reviewed the social history and family history with the patient and they are unchanged from previous note.  ALLERGIES:  has No Known Allergies.  MEDICATIONS:  Current Outpatient Medications  Medication Sig Dispense Refill   acetaminophen (TYLENOL) 500 MG tablet Take 500-1,000 mg by mouth every 6 (six) hours as needed for moderate pain.     ARTIFICIAL TEAR SOLUTION OP Place 1 drop into both eyes daily as needed (dry eyes).     cyanocobalamin (,VITAMIN B-12,) 1000 MCG/ML injection Inject 1,000 mcg into the muscle every 30 (thirty) days.   0   Iron, Ferrous Sulfate, 325 (65 Fe) MG TABS Take 325 mg by mouth daily.     levothyroxine (SYNTHROID) 75 MCG tablet Take 75 mcg by mouth daily before breakfast.     lidocaine-prilocaine (EMLA) cream Apply to affected area once 30 g 3   lisinopril (PRINIVIL,ZESTRIL) 20 MG tablet Take 20 mg by mouth daily.  3   ondansetron (ZOFRAN) 8 MG tablet Take 1 tablet (8 mg total) by mouth 2 (two) times daily as needed for refractory nausea / vomiting. Start on day 3 after chemotherapy. 30 tablet 1   polyethylene glycol powder (GLYCOLAX/MIRALAX) 17 GM/SCOOP powder Take 17 g by mouth as needed for mild constipation. (Patient taking differently: Take 17 g by mouth daily as needed for mild constipation.)     prochlorperazine (COMPAZINE) 10 MG tablet Take 1 tablet (10 mg total) by mouth every 6 (six) hours as needed (Nausea or vomiting). 30 tablet 1   traMADol (ULTRAM) 50 MG tablet Take 1-2 tablets (50-100 mg total) by mouth every 6 (six) hours as needed (tylenol first). (Patient not taking: Reported on 10/28/2021) 10 tablet 0   vitamin C (ASCORBIC ACID) 500 MG tablet Take 500 mg by mouth daily.     No current facility-administered medications for this visit.    PHYSICAL EXAMINATION: ECOG PERFORMANCE STATUS: 1 - Symptomatic but completely ambulatory  Vitals:   10/29/21 1101  BP: 129/68  Pulse: 86  Resp: 15  Temp: 98.8 F (37.1 C)   SpO2: 99%   Wt Readings from Last 3 Encounters:  10/29/21 201 lb 8 oz (91.4 kg)  10/28/21 203 lb 6.4 oz (92.3 kg)  10/22/21 204 lb 12.8 oz (92.9 kg)     GENERAL:alert, no distress and comfortable SKIN: skin color, texture, turgor are normal, no rashes or significant lesions EYES: normal, Conjunctiva are pink and non-injected, sclera clear  NECK: supple, thyroid normal size, non-tender, without nodularity LYMPH:  no palpable lymphadenopathy in the cervical, axillary  LUNGS: clear to auscultation and percussion with normal breathing effort HEART: regular rate & rhythm and no murmurs and no lower extremity edema ABDOMEN:abdomen soft, non-tender and normal bowel sounds Musculoskeletal:no cyanosis of digits and no clubbing  NEURO: alert & oriented x 3 with fluent speech, no focal motor/sensory deficits  LABORATORY DATA:  I have reviewed the data as listed    Latest Ref Rng & Units 10/29/2021   10:17 AM 10/22/2021  9:12 AM 10/08/2021    8:29 AM  CBC  WBC 4.0 - 10.5 K/uL 9.6  6.4  4.6   Hemoglobin 13.0 - 17.0 g/dL 8.9  9.2  9.6   Hematocrit 39.0 - 52.0 % 27.0  29.0  30.4   Platelets 150 - 400 K/uL 175  220  236         Latest Ref Rng & Units 10/29/2021   10:17 AM 10/22/2021    9:12 AM 10/08/2021    8:29 AM  CMP  Glucose 70 - 99 mg/dL 161  160  124   BUN 8 - 23 mg/dL $Remove'17  20  15   'JNabvvs$ Creatinine 0.61 - 1.24 mg/dL 1.10  0.98  1.14   Sodium 135 - 145 mmol/L 133  137  135   Potassium 3.5 - 5.1 mmol/L 4.0  4.0  4.2   Chloride 98 - 111 mmol/L 101  105  102   CO2 22 - 32 mmol/L $RemoveB'27  26  26   'zUzvsian$ Calcium 8.9 - 10.3 mg/dL 9.3  9.3  9.7   Total Protein 6.5 - 8.1 g/dL 7.1  6.8  7.0   Total Bilirubin 0.3 - 1.2 mg/dL 1.0  0.4  0.3   Alkaline Phos 38 - 126 U/L 64  67  67   AST 15 - 41 U/L $Remo'13  13  12   'mJpRn$ ALT 0 - 44 U/L $Remo'10  10  7       'dzYEm$ RADIOGRAPHIC STUDIES: I have personally reviewed the radiological images as listed and agreed with the findings in the report. No results found.    No orders of  the defined types were placed in this encounter.  All questions were answered. The patient knows to call the clinic with any problems, questions or concerns. No barriers to learning was detected. The total time spent in the appointment was 20 minutes.     Truitt Merle, MD 10/29/2021   I, Wilburn Mylar, am acting as scribe for Truitt Merle, MD.   I have reviewed the above documentation for accuracy and completeness, and I agree with the above.

## 2021-10-29 NOTE — Telephone Encounter (Signed)
Spoke with the patient to let him know that his surgeon is ok with him using miralax PRN.  He verbalized understanding.  Gloriajean Dell. Leonie Green, BSN

## 2021-11-04 ENCOUNTER — Other Ambulatory Visit: Payer: Self-pay

## 2021-11-04 ENCOUNTER — Inpatient Hospital Stay (HOSPITAL_BASED_OUTPATIENT_CLINIC_OR_DEPARTMENT_OTHER): Payer: Medicare Other | Admitting: Hematology

## 2021-11-04 ENCOUNTER — Encounter: Payer: Self-pay | Admitting: Hematology

## 2021-11-04 ENCOUNTER — Inpatient Hospital Stay: Payer: Medicare Other

## 2021-11-04 VITALS — BP 133/65 | HR 74 | Temp 97.6°F | Wt 203.8 lb

## 2021-11-04 DIAGNOSIS — Z5111 Encounter for antineoplastic chemotherapy: Secondary | ICD-10-CM | POA: Diagnosis not present

## 2021-11-04 DIAGNOSIS — Z95828 Presence of other vascular implants and grafts: Secondary | ICD-10-CM

## 2021-11-04 DIAGNOSIS — C182 Malignant neoplasm of ascending colon: Secondary | ICD-10-CM | POA: Diagnosis not present

## 2021-11-04 DIAGNOSIS — D509 Iron deficiency anemia, unspecified: Secondary | ICD-10-CM | POA: Diagnosis not present

## 2021-11-04 DIAGNOSIS — Z9049 Acquired absence of other specified parts of digestive tract: Secondary | ICD-10-CM | POA: Diagnosis not present

## 2021-11-04 LAB — CBC WITH DIFFERENTIAL (CANCER CENTER ONLY)
Abs Immature Granulocytes: 0 10*3/uL (ref 0.00–0.07)
Basophils Absolute: 0 10*3/uL (ref 0.0–0.1)
Basophils Relative: 0 %
Eosinophils Absolute: 0.1 10*3/uL (ref 0.0–0.5)
Eosinophils Relative: 1 %
HCT: 25.9 % — ABNORMAL LOW (ref 39.0–52.0)
Hemoglobin: 8.5 g/dL — ABNORMAL LOW (ref 13.0–17.0)
Immature Granulocytes: 0 %
Lymphocytes Relative: 23 %
Lymphs Abs: 0.9 10*3/uL (ref 0.7–4.0)
MCH: 27.3 pg (ref 26.0–34.0)
MCHC: 32.8 g/dL (ref 30.0–36.0)
MCV: 83.3 fL (ref 80.0–100.0)
Monocytes Absolute: 0.3 10*3/uL (ref 0.1–1.0)
Monocytes Relative: 7 %
Neutro Abs: 2.8 10*3/uL (ref 1.7–7.7)
Neutrophils Relative %: 69 %
Platelet Count: 257 10*3/uL (ref 150–400)
RBC: 3.11 MIL/uL — ABNORMAL LOW (ref 4.22–5.81)
RDW: 17 % — ABNORMAL HIGH (ref 11.5–15.5)
WBC Count: 4 10*3/uL (ref 4.0–10.5)
nRBC: 0 % (ref 0.0–0.2)

## 2021-11-04 LAB — CMP (CANCER CENTER ONLY)
ALT: 14 U/L (ref 0–44)
AST: 17 U/L (ref 15–41)
Albumin: 3.1 g/dL — ABNORMAL LOW (ref 3.5–5.0)
Alkaline Phosphatase: 67 U/L (ref 38–126)
Anion gap: 7 (ref 5–15)
BUN: 16 mg/dL (ref 8–23)
CO2: 26 mmol/L (ref 22–32)
Calcium: 9 mg/dL (ref 8.9–10.3)
Chloride: 104 mmol/L (ref 98–111)
Creatinine: 1.14 mg/dL (ref 0.61–1.24)
GFR, Estimated: >60 mL/min (ref >60)
Glucose, Bld: 155 mg/dL — ABNORMAL HIGH (ref 70–99)
Potassium: 4.2 mmol/L (ref 3.5–5.1)
Sodium: 137 mmol/L (ref 135–145)
Total Bilirubin: 0.3 mg/dL (ref 0.3–1.2)
Total Protein: 6.5 g/dL (ref 6.5–8.1)

## 2021-11-04 MED ORDER — SODIUM CHLORIDE 0.9% FLUSH
10.0000 mL | INTRAVENOUS | Status: AC | PRN
  Administered 2021-11-04: 10 mL

## 2021-11-04 MED ORDER — HEPARIN SOD (PORK) LOCK FLUSH 100 UNIT/ML IV SOLN: 500.0000 [IU] | INTRAVENOUS | Status: DC | PRN

## 2021-11-04 MED FILL — Dexamethasone Sodium Phosphate Inj 100 MG/10ML: INTRAMUSCULAR | Qty: 1 | Status: AC

## 2021-11-04 NOTE — Progress Notes (Signed)
[place]   [phone] Fax:(336) [phone]   Clinic Follow up Note   Patient Care Team: Shirline Frees, MD as PCP - General (Family Medicine) Truitt Merle, MD as Consulting Physician (Hematology and Oncology)  Date of Service:  11/04/2021  CHIEF COMPLAINT: f/u of colon cancer  CURRENT THERAPY:  Adjuvant FOLFOX, q14d, starting 10/22/21  ASSESSMENT & PLAN:  Joseph Pennington is a 88 y.o. male with   1. 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 Colonoscopy 07/23/21 showed 15 cm partially obstructing mass, path confirmed adenocarcinoma -staging CT CAP 07/30/21 showed: prominent but not pathologically enlarged local LN's; indeterminate groud glass opacity in right lung base; no distant mets. -Baseline CEA was normal, 1.35 (08/05/21) -s/p open right colectomy by Dr. Dema Severin 09/14/21, surgical path shows 9 cm poorly differentiated adenocarcinoma in ascending colon extending through muscular wall, lymph nodes negative. Dr. Dema Severin notes part of the tumor was wide open resulting in a defect in the colon in an area overlying the right kidney and lateral most part of the duodenum, consistent with a positive margin. -Molecular features showed loss of MLH1 and PMS2 with BRAF mutation and MLH1 hyper methylation overall indicative of a sporadic cancer rather than a germline/Lynch syndrome associated cancer. -he began adjuvant FOLFOX on 10/22/21. He tolerated well overall with mild fatigue. Plan for radiation following systemic treatment. -he is tolerating treatment well with mild fatigue that recovers by week 2. Labs reviewed, hgb 8.5, will proceed with IV Feraheme with FOLFOX tomorrow as scheduled.  -he has met rad/onc Dr. Lisbeth Renshaw and plan to have concurrent chemoRT with Xeloda after 3 months adjuvant chemo    2. 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 C/w mild IDA  -he began oral B12 and oral iron in 05/2021.  -He received IV Feraheme, 1 unit of blood, and vit B12 while hospitalized for surgery -we will give Feraheme again tomorrow with cycle 2 of FOLFOX. I explained that if his hgb remains low (<8), we will proceed with blood transfusion.   3. Hypothyroidism, HTN, pre-DM, GERD, CKD -on lisinopril and synthroid. Does not take many medications -SCr up to 2.2 in 2019 but has normalized since then -per PCP    4. Social  -Joseph Pennington is the primary care giver for his wife who has Parkinson's. They live in a 2 story home and have a farm house that is ADA approved -pt is independent with ADLs.  -he has 2 adult children who live locally and have been strong support system during this time      Plan: -proceed with C2 FOLFOX at same dose with IV Feraheme tomorrow, 8/17 -lab, flush, f/u, and FOLFOX 8/31 and 9/14 as scheduled   No problem-specific Assessment & Plan notes found for this encounter.   SUMMARY OF ONCOLOGIC HISTORY: Oncology History  Ascending colon cancer s/p lap colectomy 09/14/2021  07/23/2021 Procedure   Upper GI Endoscopy/Colonoscopy; Dr. Lorenso Courier  Colonoscopy Impression: - Likely malignant partially obstructing tumor in the ascending colon. Biopsied. - Six 3 to 8 mm polyps in the rectum and in the transverse colon, removed with a cold snare. Resected and retrieved. - Non-bleeding internal hemorrhoids.  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 Biopsied. - Multiple gastric polyps. Resected and retrieved. - Erythematous mucosa in the antrum. Biopsied. - Mucosal nodule found in the duodenum. Biopsied. - Dilated lacteals were  found in the duodenum. Biopsied. - Two non-bleeding angioectasias in the duodenum.   07/23/2021 Initial Biopsy   Diagnosis 1. Duodenum, Biopsy - BENIGN DUODENAL MUCOSA - NO ACUTE INFLAMMATION, VILLOUS BLUNTING OR INCREASED INTRAEPITHELIAL LYMPHOCYTES IDENTIFIED 2. Duodenum, Biopsy, Nodule - PEPTIC DUODENITIS - NO DYSPLASIA OR MALIGNANCY IDENTIFIED 3. Stomach, biopsy - MILD CHRONIC GASTRITIS WITHOUT ACTIVITY - NO INTESTINAL METAPLASIA IDENTIFIED - SEE COMMENT 4. Stomach, polyp(s) - HYPERPLASTIC GASTRIC POLYP(S) WITH INTESTINAL METAPLASIA - NO H. PYLORI OR MALIGNANCY IDENTIFIED 5. Esophagogastric junction, biopsy - SQUAMOCOLUMNAR JUNCTION WITH CHRONIC INFLAMMATION - NO INTESTINAL METAPLASIA, DYSPLASIA OR MALIGNANCY IDENTIFIED 6. Rectum, biopsy, and transverse - MULTIPLE FRAGMENTS OF HYPERPLASTIC POLYP(S) - NO HIGH-GRADE DYSPLASIA OR MALIGNANCY IDENTIFIED 7. Ascending Colon Biopsy, Mass - POORLY DIFFERENTIATED ADENOCARCINOMA - SEE COMMENT  Microscopic Comment 7. By immunohistochemistry, the neoplastic cells are positive for CDX2 (patchy, weak) but negative for p63, p40, CD56, chromogranin and synaptophysin. This immunoprofile is consistent with a poorly differentiated adenocarcinoma.  3. H. pylori immunohistochemistry is POSITIVE for RARE microorganisms.   07/30/2021 Imaging   EXAM: CT CHEST, ABDOMEN, AND PELVIS WITH CONTRAST  IMPRESSION: 1. Masslike region in the ascending colon extending to the level of the hepatic flexure in the right lower quadrant, concerning for malignancy. Associated pericolonic fat stranding and a few prominent lymph nodes are seen adjacent to this segment of colon, suspicious for local infiltration. No distant metastasis is seen. 2. Left inguinal hernia containing nonobstructed colon. 3. Nonspecific small ground-glass opacity in the in the right lower lobe. Attention on follow-up is recommended. 4. Small hiatal hernia. 5. Aortic atherosclerosis and  coronary artery calcifications   08/06/2021 Initial Diagnosis   Malignant neoplasm of ascending colon [facility])   Primary adenocarcinoma of ascending colon ([place])  07/23/2021 Procedure   Colonoscopy by Dr. Lorenso Courier - A frond-like/villous, fungating and ulcerated partially obstructing large mass was found in the ascending colon. There was a point at which the mass could not be bypassed, even with use of an ultraslim colonoscope. It is suspected that this mass encompasses the entire cecum and ascending colon. The mass was circumferential. The mass measured fifteen cm in length. Oozing was present.    07/23/2021 Initial Biopsy   3. H. pylori immunohistochemistry is POSITIVE for RARE microorganisms.  FINAL DIAGNOSIS Diagnosis 1. Duodenum, Biopsy - BENIGN DUODENAL MUCOSA - NO ACUTE INFLAMMATION, VILLOUS BLUNTING OR INCREASED INTRAEPITHELIAL LYMPHOCYTES IDENTIFIED 2. Duodenum, Biopsy, Nodule - PEPTIC DUODENITIS - NO DYSPLASIA OR MALIGNANCY IDENTIFIED 3. Stomach, biopsy - MILD CHRONIC GASTRITIS WITHOUT ACTIVITY - NO INTESTINAL METAPLASIA IDENTIFIED - SEE COMMENT 4. Stomach, polyp(s) - HYPERPLASTIC GASTRIC POLYP(S) WITH INTESTINAL METAPLASIA - NO H. PYLORI OR MALIGNANCY IDENTIFIED 5. Esophagogastric junction, biopsy - SQUAMOCOLUMNAR JUNCTION WITH CHRONIC INFLAMMATION - NO INTESTINAL METAPLASIA, DYSPLASIA OR MALIGNANCY IDENTIFIED 6. Rectum, biopsy, and transverse - MULTIPLE FRAGMENTS OF HYPERPLASTIC POLYP(S) - NO HIGH-GRADE DYSPLASIA OR MALIGNANCY IDENTIFIED 7. Ascending Colon Biopsy, Mass - POORLY DIFFERENTIATED ADENOCARCINOMA - SEE COMMENT   07/30/2021 Imaging   CT CAP IMPRESSION: 1. Masslike region in the ascending colon extending to the level of the hepatic flexure in the right lower quadrant, concerning for malignancy. Associated pericolonic fat stranding and a few prominent lymph nodes are seen adjacent to this segment of colon, suspicious for local infiltration. No  distant metastasis is  seen. 2. Left inguinal hernia containing nonobstructed colon. 3. Nonspecific small ground-glass opacity in the in the right lower lobe. Attention on follow-up is recommended. 4. Small hiatal hernia. 5. Aortic atherosclerosis and coronary artery calcifications   09/14/2021 Surgery   PROCEDURE:  Laparoscopic converted to open right hemicolectomy Repair of small bowel serosa x 2     09/14/2021 Pathology Results   A. COLON, RIGHT, HEMI COLECTOMY:  Poorly differentiated adenocarcinoma in the ascending colon with clear margins of resection.  Tumor Size: Circumferential measuring 9.0 cm X 8.0 cm. X 2.5 cm.  Macroscopic Tumor Perforation: Not identified  Histologic Type: Adenocarcinoma.  Histologic Grade: Grade 3, poorly differentiated.  Multiple Primary Sites: Not applicable  Tumor Extension: Extends through the muscular wall into the pericolonic adipose tissue.  Lymphovascular Invasion: Not identified.  Perineural Invasion: Not identified.  Treatment Effect: No known presurgical therapy.  Margins:       Margin Status for Invasive Carcinoma: All margins are negative for invasive carcinoma       Distance from Invasive Carcinoma to proximal margin: 11 cm.       Distance from invasive carcinoma to distal margin: 12 cm.  Regional Lymph Nodes:       Number of Lymph Nodes with Tumor: None.       Number of Lymph Nodes Examined: Twenty-two (22)  Tumor Deposits: Present (slide A11)  Distant Metastasis:       Distant Site(s) Involved: None known.  Pathologic Stage Classification (pTNM, AJCC 8th Edition): pT3 pN1c  Comments: Omentum is free of neoplastic invasion.                      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LAMN)    09/14/2021 Cancer Staging   Staging form: Colon and Rectum, AJCC 8th Edition - Pathologic stage from 09/14/2021: Stage IIIB (pT3, pN1c, cM0) - Signed by Alla Feeling, NP on 10/08/2021 Stage prefix: Initial diagnosis Total positive nodes: 0 Histologic grading  system: 4 grade system Histologic grade (G): G3 Laterality: Right Lymph-vascular invasion (LVI): LVI not present (absent)/not identified Tumor deposits (TD): Present Perineural invasion (PNI): Absent Microsatellite instability (MSI): Unstable high BRAF mutation: Positive   10/08/2021 Initial Diagnosis   Primary adenocarcinoma of ascending colon ([place])   10/22/2021 -  Chemotherapy   Patient is on Treatment Plan : COLORECTAL FOLFOX q14d x 3 months        INTERVAL HISTORY:  ROSHON DUELL is here for a follow up of colon cancer. He was last seen by me on 10/29/21. He presents to the clinic accompanied by his daughter. He reports he is doing well overall. He notes he regularly took antinausea medicine prophylactically. He reports he had fatigue the first week but recovered well the second week and notes he is about back to normal.   All other systems were reviewed with the patient and are negative.  MEDICAL HISTORY:  Past Medical History:  Diagnosis Date   Acute cholecystitis 02/19/2018   Anemia    Chronic kidney disease    Colon cancer ([place])    Diabetes mellitus without complication (HCC)    diet controlled   GERD (gastroesophageal reflux disease)    History of blood transfusion 08/2021   History of hiatal hernia    Hypertension    Hypothyroidism    Iron deficiency anemia    Peripheral neuropathy    bilateral feet    SURGICAL HISTORY: Past Surgical History:  Procedure Laterality Date  CHOLECYSTECTOMY N/A 02/20/2018   Procedure: LAPAROSCOPIC CHOLECYSTECTOMY;  Surgeon: Excell Seltzer, MD;  Location: [facility];  Service: General;  Laterality: N/A;   COLONOSCOPY  2023   LAPAROSCOPIC RIGHT HEMI COLECTOMY Right 09/14/2021   Procedure: LAPAROSCOPIC TO OPEN RIGHT HEMI COLECTOMY;  Surgeon: Ileana Roup, MD;  Location: [facility];  Service: General;  Laterality: Right;   PORTACATH PLACEMENT N/A 10/15/2021   Procedure: INSERTION PORT-A-CATH;  Surgeon: Ileana Roup, MD;   Location: [facility];  Service: General;  Laterality: N/A;  with ultrasound and flouroscopic guidance    I have reviewed the social history and family history with the patient and they are unchanged from previous note.  ALLERGIES:  has No Known Allergies.  MEDICATIONS:  Current Outpatient Medications  Medication Sig Dispense Refill   acetaminophen (TYLENOL) 500 MG tablet Take 500-1,000 mg by mouth every 6 (six) hours as needed for moderate pain.     ARTIFICIAL TEAR SOLUTION OP Place 1 drop into both eyes daily as needed (dry eyes).     cyanocobalamin (,VITAMIN B-12,) 1000 MCG/ML injection Inject 1,000 mcg into the muscle every 30 (thirty) days.   0   Iron, Ferrous Sulfate, 325 (65 Fe) MG TABS Take 325 mg by mouth daily.     levothyroxine (SYNTHROID) 75 MCG tablet Take 75 mcg by mouth daily before breakfast.     lidocaine-prilocaine (EMLA) cream Apply to affected area once 30 g 3   lisinopril (PRINIVIL,ZESTRIL) 20 MG tablet Take 20 mg by mouth daily.  3   ondansetron (ZOFRAN) 8 MG tablet Take 1 tablet (8 mg total) by mouth 2 (two) times daily as needed for refractory nausea / vomiting. Start on day 3 after chemotherapy. 30 tablet 1   polyethylene glycol powder (GLYCOLAX/MIRALAX) 17 GM/SCOOP powder Take 17 g by mouth as needed for mild constipation. (Patient taking differently: Take 17 g by mouth daily as needed for mild constipation.)     prochlorperazine (COMPAZINE) 10 MG tablet Take 1 tablet (10 mg total) by mouth every 6 (six) hours as needed (Nausea or vomiting). 30 tablet 1   traMADol (ULTRAM) 50 MG tablet Take 1-2 tablets (50-100 mg total) by mouth every 6 (six) hours as needed (tylenol first). (Patient not taking: Reported on 10/28/2021) 10 tablet 0   vitamin C (ASCORBIC ACID) 500 MG tablet Take 500 mg by mouth daily.     No current facility-administered medications for this visit.    PHYSICAL EXAMINATION: ECOG PERFORMANCE STATUS: 1 - Symptomatic but completely ambulatory  Vitals:    11/04/21 1416  BP: 133/65  Pulse: 74  Temp: 97.6 F (36.4 C)   Wt Readings from Last 3 Encounters:  11/04/21 203 lb 12.8 oz (92.4 kg)  10/29/21 201 lb 8 oz (91.4 kg)  10/28/21 203 lb 6.4 oz (92.3 kg)     GENERAL:alert, no distress and comfortable SKIN: skin color normal, no rashes or significant lesions EYES: normal, Conjunctiva are pink and non-injected, sclera clear  NEURO: alert & oriented x 3 with fluent speech  LABORATORY DATA:  I have reviewed the data as listed    Latest Ref Rng & Units 11/04/2021    1:55 PM 10/29/2021   10:17 AM 10/22/2021    9:12 AM  CBC  WBC 4.0 - 10.5 K/uL 4.0  9.6  6.4   Hemoglobin 13.0 - 17.0 g/dL 8.5  8.9  9.2   Hematocrit 39.0 - 52.0 % 25.9  27.0  29.0   Platelets 150 - 400 K/uL 257  175  220         Latest Ref Rng & Units 11/04/2021    1:55 PM 10/29/2021   10:17 AM 10/22/2021    9:12 AM  CMP  Glucose 70 - 99 mg/dL 155  161  160   BUN 8 - 23 mg/dL _0 Creatinine 0.61 - 1.24 mg/dL 1.14  1.10  0.98   Sodium 135 - 145 mmol/L 137  133  137   Potassium 3.5 - 5.1 mmol/L 4.2  4.0  4.0   Chloride 98 - 111 mmol/L 104  101  105   CO2 22 - 32 mmol/L _1 Calcium 8.9 - 10.3 mg/dL 9.0  9.3  9.3   Total Protein 6.5 - 8.1 g/dL 6.5  7.1  6.8   Total Bilirubin 0.3 - 1.2 mg/dL 0.3  1.0  0.4   Alkaline Phos 38 - 126 U/L 67  64  67   AST 15 - 41 U/L _2 ALT 0 - 44 U/L _3 RADIOGRAPHIC STUDIES: I have personally reviewed the radiological images as listed and agreed with the findings in the report. No results found.    No orders of the defined types were placed in this encounter.  All questions were answered. The patient knows to call the clinic with any problems, questions or concerns. No barriers to learning was detected. The total time spent in the appointment was 30 minutes.     Truitt Merle, MD 11/04/2021   I, Wilburn Mylar, am acting as scribe for Truitt Merle, MD.   I have reviewed the above  documentation for accuracy and completeness, and I agree with the above.

## 2021-11-05 ENCOUNTER — Inpatient Hospital Stay: Payer: Medicare Other

## 2021-11-05 VITALS — BP 136/69 | HR 57 | Temp 98.2°F | Resp 17

## 2021-11-05 DIAGNOSIS — C182 Malignant neoplasm of ascending colon: Secondary | ICD-10-CM | POA: Diagnosis not present

## 2021-11-05 DIAGNOSIS — D509 Iron deficiency anemia, unspecified: Secondary | ICD-10-CM | POA: Diagnosis not present

## 2021-11-05 DIAGNOSIS — Z9049 Acquired absence of other specified parts of digestive tract: Secondary | ICD-10-CM | POA: Diagnosis not present

## 2021-11-05 DIAGNOSIS — Z5111 Encounter for antineoplastic chemotherapy: Secondary | ICD-10-CM | POA: Diagnosis not present

## 2021-11-05 DIAGNOSIS — D500 Iron deficiency anemia secondary to blood loss (chronic): Secondary | ICD-10-CM

## 2021-11-05 MED ORDER — SODIUM CHLORIDE 0.9 % IV SOLN
510.0000 mg | Freq: Once | INTRAVENOUS | Status: AC
  Administered 2021-11-05: 510 mg via INTRAVENOUS
  Filled 2021-11-05: qty 510

## 2021-11-05 MED ORDER — DEXTROSE 5 % IV SOLN: Freq: Once | INTRAVENOUS | Status: AC

## 2021-11-05 MED ORDER — SODIUM CHLORIDE 0.9 % IV SOLN
2000.0000 mg/m2 | INTRAVENOUS | Status: DC
  Administered 2021-11-05: 4300 mg via INTRAVENOUS
  Filled 2021-11-05: qty 86

## 2021-11-05 MED ORDER — SODIUM CHLORIDE 0.9 % IV SOLN: Freq: Once | INTRAVENOUS | Status: DC

## 2021-11-05 MED ORDER — SODIUM CHLORIDE 0.9 % IV SOLN
10.0000 mg | Freq: Once | INTRAVENOUS | Status: AC
  Administered 2021-11-05: 10 mg via INTRAVENOUS
  Filled 2021-11-05: qty 10

## 2021-11-05 MED ORDER — PALONOSETRON HCL INJECTION 0.25 MG/5ML
0.2500 mg | Freq: Once | INTRAVENOUS | Status: AC
  Administered 2021-11-05: 0.25 mg via INTRAVENOUS
  Filled 2021-11-05: qty 5

## 2021-11-05 MED ORDER — LEUCOVORIN CALCIUM INJECTION 350 MG
400.0000 mg/m2 | Freq: Once | INTRAVENOUS | Status: AC
  Administered 2021-11-05: 856 mg via INTRAVENOUS
  Filled 2021-11-05: qty 42.8

## 2021-11-05 MED ORDER — OXALIPLATIN CHEMO INJECTION 100 MG/20ML
70.0000 mg/m2 | Freq: Once | INTRAVENOUS | Status: AC
  Administered 2021-11-05: 150 mg via INTRAVENOUS
  Filled 2021-11-05: qty 20

## 2021-11-07 ENCOUNTER — Inpatient Hospital Stay: Payer: Medicare Other

## 2021-11-07 VITALS — BP 131/51 | HR 73 | Temp 97.9°F | Resp 16

## 2021-11-07 DIAGNOSIS — Z9049 Acquired absence of other specified parts of digestive tract: Secondary | ICD-10-CM | POA: Diagnosis not present

## 2021-11-07 DIAGNOSIS — Z5111 Encounter for antineoplastic chemotherapy: Secondary | ICD-10-CM | POA: Diagnosis not present

## 2021-11-07 DIAGNOSIS — D509 Iron deficiency anemia, unspecified: Secondary | ICD-10-CM | POA: Diagnosis not present

## 2021-11-07 DIAGNOSIS — C182 Malignant neoplasm of ascending colon: Secondary | ICD-10-CM | POA: Diagnosis not present

## 2021-11-07 MED ORDER — SODIUM CHLORIDE 0.9% FLUSH
10.0000 mL | INTRAVENOUS | Status: DC | PRN
  Administered 2021-11-07: 10 mL

## 2021-11-07 MED ORDER — HEPARIN SOD (PORK) LOCK FLUSH 100 UNIT/ML IV SOLN
500.0000 [IU] | Freq: Once | INTRAVENOUS | Status: AC | PRN
  Administered 2021-11-07: 500 [IU]

## 2021-11-18 MED FILL — Dexamethasone Sodium Phosphate Inj 100 MG/10ML: INTRAMUSCULAR | Qty: 1 | Status: AC

## 2021-11-19 ENCOUNTER — Inpatient Hospital Stay: Payer: Medicare Other

## 2021-11-19 ENCOUNTER — Other Ambulatory Visit: Payer: Self-pay

## 2021-11-19 ENCOUNTER — Encounter: Payer: Self-pay | Admitting: Hematology

## 2021-11-19 ENCOUNTER — Inpatient Hospital Stay (HOSPITAL_BASED_OUTPATIENT_CLINIC_OR_DEPARTMENT_OTHER): Payer: Medicare Other | Admitting: Hematology

## 2021-11-19 VITALS — BP 131/68 | HR 73 | Temp 98.0°F | Resp 18 | Ht 73.0 in | Wt 203.2 lb

## 2021-11-19 DIAGNOSIS — C182 Malignant neoplasm of ascending colon: Secondary | ICD-10-CM

## 2021-11-19 DIAGNOSIS — Z5111 Encounter for antineoplastic chemotherapy: Secondary | ICD-10-CM | POA: Diagnosis not present

## 2021-11-19 DIAGNOSIS — Z9049 Acquired absence of other specified parts of digestive tract: Secondary | ICD-10-CM | POA: Diagnosis not present

## 2021-11-19 DIAGNOSIS — D509 Iron deficiency anemia, unspecified: Secondary | ICD-10-CM | POA: Diagnosis not present

## 2021-11-19 LAB — CMP (CANCER CENTER ONLY)
ALT: 10 U/L (ref 0–44)
AST: 15 U/L (ref 15–41)
Albumin: 3.9 g/dL (ref 3.5–5.0)
Alkaline Phosphatase: 71 U/L (ref 38–126)
Anion gap: 3 — ABNORMAL LOW (ref 5–15)
BUN: 26 mg/dL — ABNORMAL HIGH (ref 8–23)
CO2: 27 mmol/L (ref 22–32)
Calcium: 9.6 mg/dL (ref 8.9–10.3)
Chloride: 105 mmol/L (ref 98–111)
Creatinine: 1.18 mg/dL (ref 0.61–1.24)
GFR, Estimated: 60 mL/min — ABNORMAL LOW (ref >60)
Glucose, Bld: 207 mg/dL — ABNORMAL HIGH (ref 70–99)
Potassium: 4.1 mmol/L (ref 3.5–5.1)
Sodium: 135 mmol/L (ref 135–145)
Total Bilirubin: 0.4 mg/dL (ref 0.3–1.2)
Total Protein: 6.7 g/dL (ref 6.5–8.1)

## 2021-11-19 LAB — CBC WITH DIFFERENTIAL (CANCER CENTER ONLY)
Abs Immature Granulocytes: 0.01 10*3/uL (ref 0.00–0.07)
Basophils Absolute: 0 10*3/uL (ref 0.0–0.1)
Basophils Relative: 0 %
Eosinophils Absolute: 0.1 10*3/uL (ref 0.0–0.5)
Eosinophils Relative: 1 %
HCT: 30.2 % — ABNORMAL LOW (ref 39.0–52.0)
Hemoglobin: 10 g/dL — ABNORMAL LOW (ref 13.0–17.0)
Immature Granulocytes: 0 %
Lymphocytes Relative: 20 %
Lymphs Abs: 0.9 10*3/uL (ref 0.7–4.0)
MCH: 29 pg (ref 26.0–34.0)
MCHC: 33.1 g/dL (ref 30.0–36.0)
MCV: 87.5 fL (ref 80.0–100.0)
Monocytes Absolute: 0.5 10*3/uL (ref 0.1–1.0)
Monocytes Relative: 10 %
Neutro Abs: 3.2 10*3/uL (ref 1.7–7.7)
Neutrophils Relative %: 69 %
Platelet Count: 86 10*3/uL — ABNORMAL LOW (ref 150–400)
RBC: 3.45 MIL/uL — ABNORMAL LOW (ref 4.22–5.81)
RDW: 20.5 % — ABNORMAL HIGH (ref 11.5–15.5)
WBC Count: 4.7 10*3/uL (ref 4.0–10.5)
nRBC: 0 % (ref 0.0–0.2)

## 2021-11-19 MED ORDER — LEUCOVORIN CALCIUM INJECTION 350 MG
400.0000 mg/m2 | Freq: Once | INTRAVENOUS | Status: AC
  Administered 2021-11-19: 840 mg via INTRAVENOUS
  Filled 2021-11-19: qty 42

## 2021-11-19 MED ORDER — SODIUM CHLORIDE 0.9 % IV SOLN
1800.0000 mg/m2 | INTRAVENOUS | Status: DC
  Administered 2021-11-19: 3800 mg via INTRAVENOUS
  Filled 2021-11-19: qty 76

## 2021-11-19 MED ORDER — SODIUM CHLORIDE 0.9 % IV SOLN
10.0000 mg | Freq: Once | INTRAVENOUS | Status: AC
  Administered 2021-11-19: 10 mg via INTRAVENOUS
  Filled 2021-11-19: qty 10

## 2021-11-19 MED ORDER — FLUOROURACIL CHEMO INJECTION 2.5 GM/50ML
400.0000 mg/m2 | Freq: Once | INTRAVENOUS | Status: AC
  Administered 2021-11-19: 850 mg via INTRAVENOUS
  Filled 2021-11-19: qty 17

## 2021-11-19 MED ORDER — SODIUM CHLORIDE 0.9% FLUSH
10.0000 mL | INTRAVENOUS | Status: AC
  Administered 2021-11-19: 10 mL via INTRAVENOUS

## 2021-11-19 MED ORDER — OXALIPLATIN CHEMO INJECTION 100 MG/20ML
50.0000 mg/m2 | Freq: Once | INTRAVENOUS | Status: AC
  Administered 2021-11-19: 105 mg via INTRAVENOUS
  Filled 2021-11-19: qty 20

## 2021-11-19 MED ORDER — DEXTROSE 5 % IV SOLN: Freq: Once | INTRAVENOUS | Status: AC

## 2021-11-19 MED ORDER — PALONOSETRON HCL INJECTION 0.25 MG/5ML
0.2500 mg | Freq: Once | INTRAVENOUS | Status: AC
  Administered 2021-11-19: 0.25 mg via INTRAVENOUS
  Filled 2021-11-19: qty 5

## 2021-11-19 NOTE — Progress Notes (Signed)
Per Dr. Ernestina Penna office visit note, okay to treat with PLT 86 K/uL.

## 2021-11-19 NOTE — Patient Instructions (Signed)
[facility]  Discharge Instructions: Thank you for choosing [facility] to provide your oncology and hematology care.   If you have a lab appointment with the [facility], please go directly to the [facility] and check in at the registration area.   Wear comfortable clothing and clothing appropriate for easy access to any Portacath or PICC line.   We strive to give you quality time with your provider. You may need to reschedule your appointment if you arrive late (15 or more minutes).  Arriving late affects you and other patients whose appointments are after yours.  Also, if you miss three or more appointments without notifying the office, you may be dismissed from the clinic at the provider's discretion.      For prescription refill requests, have your pharmacy contact our office and allow 72 hours for refills to be completed.    Today you received the following chemotherapy and/or immunotherapy agents: Oxaliplatin/Leucovorin/Fluorouracil      To help prevent nausea and vomiting after your treatment, we encourage you to take your nausea medication as directed.  BELOW ARE SYMPTOMS THAT SHOULD BE REPORTED IMMEDIATELY: *FEVER GREATER THAN 100.4 F (38 C) OR HIGHER *CHILLS OR SWEATING *NAUSEA AND VOMITING THAT IS NOT CONTROLLED WITH YOUR NAUSEA MEDICATION *UNUSUAL SHORTNESS OF BREATH *UNUSUAL BRUISING OR BLEEDING *URINARY PROBLEMS (pain or burning when urinating, or frequent urination) *BOWEL PROBLEMS (unusual diarrhea, constipation, pain near the anus) TENDERNESS IN MOUTH AND THROAT WITH OR WITHOUT PRESENCE OF ULCERS (sore throat, sores in mouth, or a toothache) UNUSUAL RASH, SWELLING OR PAIN  UNUSUAL VAGINAL DISCHARGE OR ITCHING   Items with * indicate a potential emergency and should be followed up as soon as possible or go to the Emergency Department if any problems should occur.  Please show the CHEMOTHERAPY ALERT CARD or IMMUNOTHERAPY  ALERT CARD at check-in to the Emergency Department and triage nurse.  Should you have questions after your visit or need to cancel or reschedule your appointment, please contact [facility]  Dept: [phone]  and follow the prompts.  Office hours are 8:00 a.m. to 4:30 p.m. Monday - Friday. Please note that voicemails left after 4:00 p.m. may not be returned until the following business day.  We are closed weekends and major holidays. You have access to a nurse at all times for urgent questions. Please call the main number to the clinic Dept: [phone] and follow the prompts.   For any non-urgent questions, you may also contact your provider using MyChart. We now offer e-Visits for anyone 11 and older to request care online for non-urgent symptoms. For details visit [email or web address].   Also download the MyChart app! Go to the app store, search "MyChart", open the app, select [facility], and log in with your MyChart username and password.  Masks are optional in the cancer centers. If you would like for your care team to wear a mask while they are taking care of you, please let them know. You may have one support person who is at least 86 years old accompany you for your appointments.

## 2021-11-19 NOTE — Progress Notes (Signed)
[place]   [phone] [phone]   Clinic Follow up Note   Patient Care Team: Shirline Frees, MD as PCP - General (Family Medicine) Truitt Merle, MD as Consulting Physician (Hematology and Oncology)  Date of Service:  11/19/2021  CHIEF COMPLAINT: f/u of colon cancer  CURRENT THERAPY:  Adjuvant FOLFOX, q14d, starting 10/22/21  ASSESSMENT & PLAN:  Joseph Pennington is a 88 y.o. male with   1. 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 Colonoscopy 07/23/21 showed 15 cm partially obstructing mass, path confirmed adenocarcinoma -staging CT CAP 07/30/21 showed: prominent but not pathologically enlarged local LN's; indeterminate groud glass opacity in right lung base; no distant mets. -Baseline CEA was normal, 1.35 (08/05/21) -s/p open right colectomy by Dr. Dema Severin 09/14/21, surgical path shows 9 cm poorly differentiated adenocarcinoma in ascending colon extending through muscular wall, lymph nodes negative. Dr. Dema Severin notes part of the tumor was wide open resulting in a defect in the colon in an area overlying the right kidney and lateral most part of the duodenum, consistent with a positive margin. -Molecular features showed loss of MLH1 and PMS2 with BRAF mutation and MLH1 hyper methylation overall indicative of a sporadic cancer rather than a germline/Lynch syndrome associated cancer. -he began adjuvant FOLFOX on 10/22/21. Plan for concurrent chemoRT with Xeloda after 3 months adjuvant chemo -he is tolerating treatment moderately well with some fatigue and weakness. Labs reviewed, hgb improved to 10 with IV Feraheme, but plt 86k. We discussed continuing with treatment today or delaying for a week. He would like to proceed, and I encouraged him to call with any further side effects.   2.  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 C/w mild IDA  -he began oral B12 and oral iron in 05/2021.  -He received IV Feraheme, 1 unit of blood, and vit B12 while hospitalized for surgery -he was given IV Feraheme with first two cycles FOLFOX, hgb improved to 10 today (11/19/21)   3. Hypothyroidism, HTN, pre-DM, GERD, CKD -on lisinopril and synthroid. Does not take many medications -SCr up to 2.2 in 2019 but has normalized since then -per PCP    4. Social  -Joseph Pennington is the primary care giver for his wife who has Parkinson's. They live in a 2 story home and have a farm house that is ADA approved -pt is independent with ADLs.  -he has 2 adult children who live locally and have been strong support system during this time      Plan: -proceed with C3 FOLFOX with further dose reduction due to thrombocytopenia  -lab in 1 week (9/7 or 9/8) -lab, flush, f/u, and FOLFOX 9/14 as scheduled   No problem-specific Assessment & Plan notes found for this encounter.   SUMMARY OF ONCOLOGIC HISTORY: Oncology History  Ascending colon cancer s/p lap colectomy 09/14/2021  07/23/2021 Procedure   Upper GI Endoscopy/Colonoscopy; Dr. Lorenso Courier  Colonoscopy Impression: - Likely malignant partially obstructing tumor in the ascending colon. Biopsied. - Six 3 to 8 mm polyps in the rectum and in the transverse colon, removed with a cold snare. Resected and retrieved. - Non-bleeding internal hemorrhoids.  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 Biopsied. - Multiple gastric polyps. Resected and retrieved. - Erythematous mucosa in the antrum. Biopsied. - Mucosal nodule found in the duodenum. Biopsied. - Dilated lacteals were found in the duodenum.  Biopsied. - Two non-bleeding angioectasias in the duodenum.   07/23/2021 Initial Biopsy   Diagnosis 1. Duodenum, Biopsy - BENIGN DUODENAL MUCOSA - NO ACUTE INFLAMMATION, VILLOUS BLUNTING OR INCREASED INTRAEPITHELIAL LYMPHOCYTES IDENTIFIED 2. Duodenum, Biopsy, Nodule - PEPTIC DUODENITIS - NO DYSPLASIA OR MALIGNANCY IDENTIFIED 3. Stomach, biopsy - MILD CHRONIC GASTRITIS WITHOUT ACTIVITY - NO INTESTINAL METAPLASIA IDENTIFIED - SEE COMMENT 4. Stomach, polyp(s) - HYPERPLASTIC GASTRIC POLYP(S) WITH INTESTINAL METAPLASIA - NO H. PYLORI OR MALIGNANCY IDENTIFIED 5. Esophagogastric junction, biopsy - SQUAMOCOLUMNAR JUNCTION WITH CHRONIC INFLAMMATION - NO INTESTINAL METAPLASIA, DYSPLASIA OR MALIGNANCY IDENTIFIED 6. Rectum, biopsy, and transverse - MULTIPLE FRAGMENTS OF HYPERPLASTIC POLYP(S) - NO HIGH-GRADE DYSPLASIA OR MALIGNANCY IDENTIFIED 7. Ascending Colon Biopsy, Mass - POORLY DIFFERENTIATED ADENOCARCINOMA - SEE COMMENT  Microscopic Comment 7. By immunohistochemistry, the neoplastic cells are positive for CDX2 (patchy, weak) but negative for p63, p40, CD56, chromogranin and synaptophysin. This immunoprofile is consistent with a poorly differentiated adenocarcinoma.  3. H. pylori immunohistochemistry is POSITIVE for RARE microorganisms.   07/30/2021 Imaging   EXAM: CT CHEST, ABDOMEN, AND PELVIS WITH CONTRAST  IMPRESSION: 1. Masslike region in the ascending colon extending to the level of the hepatic flexure in the right lower quadrant, concerning for malignancy. Associated pericolonic fat stranding and a few prominent lymph nodes are seen adjacent to this segment of colon, suspicious for local infiltration. No distant metastasis is seen. 2. Left inguinal hernia containing nonobstructed colon. 3. Nonspecific small ground-glass opacity in the in the right lower lobe. Attention on follow-up is recommended. 4. Small hiatal hernia. 5. Aortic atherosclerosis and coronary artery  calcifications   08/06/2021 Initial Diagnosis   Malignant neoplasm of ascending colon [facility])   Primary adenocarcinoma of ascending colon ([place])  07/23/2021 Procedure   Colonoscopy by Dr. Lorenso Courier - A frond-like/villous, fungating and ulcerated partially obstructing large mass was found in the ascending colon. There was a point at which the mass could not be bypassed, even with use of an ultraslim colonoscope. It is suspected that this mass encompasses the entire cecum and ascending colon. The mass was circumferential. The mass measured fifteen cm in length. Oozing was present.    07/23/2021 Initial Biopsy   3. H. pylori immunohistochemistry is POSITIVE for RARE microorganisms.  FINAL DIAGNOSIS Diagnosis 1. Duodenum, Biopsy - BENIGN DUODENAL MUCOSA - NO ACUTE INFLAMMATION, VILLOUS BLUNTING OR INCREASED INTRAEPITHELIAL LYMPHOCYTES IDENTIFIED 2. Duodenum, Biopsy, Nodule - PEPTIC DUODENITIS - NO DYSPLASIA OR MALIGNANCY IDENTIFIED 3. Stomach, biopsy - MILD CHRONIC GASTRITIS WITHOUT ACTIVITY - NO INTESTINAL METAPLASIA IDENTIFIED - SEE COMMENT 4. Stomach, polyp(s) - HYPERPLASTIC GASTRIC POLYP(S) WITH INTESTINAL METAPLASIA - NO H. PYLORI OR MALIGNANCY IDENTIFIED 5. Esophagogastric junction, biopsy - SQUAMOCOLUMNAR JUNCTION WITH CHRONIC INFLAMMATION - NO INTESTINAL METAPLASIA, DYSPLASIA OR MALIGNANCY IDENTIFIED 6. Rectum, biopsy, and transverse - MULTIPLE FRAGMENTS OF HYPERPLASTIC POLYP(S) - NO HIGH-GRADE DYSPLASIA OR MALIGNANCY IDENTIFIED 7. Ascending Colon Biopsy, Mass - POORLY DIFFERENTIATED ADENOCARCINOMA - SEE COMMENT   07/30/2021 Imaging   CT CAP IMPRESSION: 1. Masslike region in the ascending colon extending to the level of the hepatic flexure in the right lower quadrant, concerning for malignancy. Associated pericolonic fat stranding and a few prominent lymph nodes are seen adjacent to this segment of colon, suspicious for local infiltration. No distant metastasis is  seen. 2. Left  inguinal hernia containing nonobstructed colon. 3. Nonspecific small ground-glass opacity in the in the right lower lobe. Attention on follow-up is recommended. 4. Small hiatal hernia. 5. Aortic atherosclerosis and coronary artery calcifications   09/14/2021 Surgery   PROCEDURE:  Laparoscopic converted to open right hemicolectomy Repair of small bowel serosa x 2     09/14/2021 Pathology Results   A. COLON, RIGHT, HEMI COLECTOMY:  Poorly differentiated adenocarcinoma in the ascending colon with clear margins of resection.  Tumor Size: Circumferential measuring 9.0 cm X 8.0 cm. X 2.5 cm.  Macroscopic Tumor Perforation: Not identified  Histologic Type: Adenocarcinoma.  Histologic Grade: Grade 3, poorly differentiated.  Multiple Primary Sites: Not applicable  Tumor Extension: Extends through the muscular wall into the pericolonic adipose tissue.  Lymphovascular Invasion: Not identified.  Perineural Invasion: Not identified.  Treatment Effect: No known presurgical therapy.  Margins:       Margin Status for Invasive Carcinoma: All margins are negative for invasive carcinoma       Distance from Invasive Carcinoma to proximal margin: 11 cm.       Distance from invasive carcinoma to distal margin: 12 cm.  Regional Lymph Nodes:       Number of Lymph Nodes with Tumor: None.       Number of Lymph Nodes Examined: Twenty-two (22)  Tumor Deposits: Present (slide A11)  Distant Metastasis:       Distant Site(s) Involved: None known.  Pathologic Stage Classification (pTNM, AJCC 8th Edition): pT3 pN1c  Comments: Omentum is free of neoplastic invasion.                      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LAMN)    09/14/2021 Cancer Staging   Staging form: Colon and Rectum, AJCC 8th Edition - Pathologic stage from 09/14/2021: Stage IIIB (pT3, pN1c, cM0) - Signed by Alla Feeling, NP on 10/08/2021 Stage prefix: Initial diagnosis Total positive nodes: 0 Histologic grading system: 4 grade  system Histologic grade (G): G3 Laterality: Right Lymph-vascular invasion (LVI): LVI not present (absent)/not identified Tumor deposits (TD): Present Perineural invasion (PNI): Absent Microsatellite instability (MSI): Unstable high BRAF mutation: Positive   10/08/2021 Initial Diagnosis   Primary adenocarcinoma of ascending colon ([place])   10/22/2021 - 11/07/2021 Chemotherapy   Patient is on Treatment Plan : COLORECTAL FOLFOX q14d x 3 months     10/22/2021 -  Chemotherapy   Patient is on Treatment Plan : COLORECTAL FOLFOX q14d x 3 months        INTERVAL HISTORY:  Joseph Pennington is here for a follow up of colon cancer. He was last seen by me on 11/04/21. He presents to the clinic accompanied by his son. He reports feeling "washed out" for about a week. He also notes some nausea. He reports his main complaint is fatigue and some weakness.   All other systems were reviewed with the patient and are negative.  MEDICAL HISTORY:  Past Medical History:  Diagnosis Date   Acute cholecystitis 02/19/2018   Anemia    Chronic kidney disease    Colon cancer ([place])    Diabetes mellitus without complication (HCC)    diet controlled   GERD (gastroesophageal reflux disease)    History of blood transfusion 08/2021   History of hiatal hernia    Hypertension    Hypothyroidism    Iron deficiency anemia    Peripheral neuropathy    bilateral feet    SURGICAL HISTORY: Past Surgical  History:  Procedure Laterality Date   CHOLECYSTECTOMY N/A 02/20/2018   Procedure: LAPAROSCOPIC CHOLECYSTECTOMY;  Surgeon: Excell Seltzer, MD;  Location: [facility];  Service: General;  Laterality: N/A;   COLONOSCOPY  2023   LAPAROSCOPIC RIGHT HEMI COLECTOMY Right 09/14/2021   Procedure: LAPAROSCOPIC TO OPEN RIGHT HEMI COLECTOMY;  Surgeon: Ileana Roup, MD;  Location: [facility];  Service: General;  Laterality: Right;   PORTACATH PLACEMENT N/A 10/15/2021   Procedure: INSERTION PORT-A-CATH;  Surgeon: Ileana Roup, MD;  Location: [facility];  Service: General;  Laterality: N/A;  with ultrasound and flouroscopic guidance    I have reviewed the social history and family history with the patient and they are unchanged from previous note.  ALLERGIES:  has No Known Allergies.  MEDICATIONS:  Current Outpatient Medications  Medication Sig Dispense Refill   acetaminophen (TYLENOL) 500 MG tablet Take 500-1,000 mg by mouth every 6 (six) hours as needed for moderate pain.     ARTIFICIAL TEAR SOLUTION OP Place 1 drop into both eyes daily as needed (dry eyes).     cyanocobalamin (,VITAMIN B-12,) 1000 MCG/ML injection Inject 1,000 mcg into the muscle every 30 (thirty) days.   0   Iron, Ferrous Sulfate, 325 (65 Fe) MG TABS Take 325 mg by mouth daily.     levothyroxine (SYNTHROID) 75 MCG tablet Take 75 mcg by mouth daily before breakfast.     lisinopril (PRINIVIL,ZESTRIL) 20 MG tablet Take 20 mg by mouth daily.  3   polyethylene glycol powder (GLYCOLAX/MIRALAX) 17 GM/SCOOP powder Take 17 g by mouth as needed for mild constipation. (Patient taking differently: Take 17 g by mouth daily as needed for mild constipation.)     traMADol (ULTRAM) 50 MG tablet Take 1-2 tablets (50-100 mg total) by mouth every 6 (six) hours as needed (tylenol first). (Patient not taking: Reported on 10/28/2021) 10 tablet 0   vitamin C (ASCORBIC ACID) 500 MG tablet Take 500 mg by mouth daily.     No current facility-administered medications for this visit.    PHYSICAL EXAMINATION: ECOG PERFORMANCE STATUS: 1 - Symptomatic but completely ambulatory  Vitals:   11/19/21 0906  BP: 131/68  Pulse: 73  Resp: 18  Temp: 98 F (36.7 C)  SpO2: 100%   Wt Readings from Last 3 Encounters:  11/19/21 203 lb 3.2 oz (92.2 kg)  11/04/21 203 lb 12.8 oz (92.4 kg)  10/29/21 201 lb 8 oz (91.4 kg)     GENERAL:alert, no distress and comfortable SKIN: skin color normal, no rashes or significant lesions EYES: normal, Conjunctiva are pink and  non-injected, sclera clear  NEURO: alert & oriented x 3 with fluent speech  LABORATORY DATA:  I have reviewed the data as listed    Latest Ref Rng & Units 11/19/2021    8:45 AM 11/04/2021    1:55 PM 10/29/2021   10:17 AM  CBC  WBC 4.0 - 10.5 K/uL 4.7  4.0  9.6   Hemoglobin 13.0 - 17.0 g/dL 10.0  8.5  8.9   Hematocrit 39.0 - 52.0 % 30.2  25.9  27.0   Platelets 150 - 400 K/uL 86  257  175         Latest Ref Rng & Units 11/19/2021    8:45 AM 11/04/2021    1:55 PM 10/29/2021   10:17 AM  CMP  Glucose 70 - 99 mg/dL 207  155  161   BUN 8 - 23 mg/dL $Remove'26  16  17   'NfnORLK$ Creatinine 0.61 -  1.24 mg/dL 1.18  1.14  1.10   Sodium 135 - 145 mmol/L 135  137  133   Potassium 3.5 - 5.1 mmol/L 4.1  4.2  4.0   Chloride 98 - 111 mmol/L 105  104  101   CO2 22 - 32 mmol/L $RemoveB'27  26  27   'UnEbUaet$ Calcium 8.9 - 10.3 mg/dL 9.6  9.0  9.3   Total Protein 6.5 - 8.1 g/dL 6.7  6.5  7.1   Total Bilirubin 0.3 - 1.2 mg/dL 0.4  0.3  1.0   Alkaline Phos 38 - 126 U/L 71  67  64   AST 15 - 41 U/L $Remo'15  17  13   'JJhAU$ ALT 0 - 44 U/L $Remo'10  14  10       'yVvrR$ RADIOGRAPHIC STUDIES: I have personally reviewed the radiological images as listed and agreed with the findings in the report. No results found.    Orders Placed This Encounter  Procedures   CBC with Differential ([facility] Only)    Standing Status:   Future    Standing Expiration Date:   11/20/2022   CMP ([facility] only)    Standing Status:   Future    Standing Expiration Date:   11/20/2022   CBC with Differential ([facility] Only)    Standing Status:   Future    Number of Occurrences:   1    Standing Expiration Date:   12/04/2022   CMP ([facility] only)    Standing Status:   Future    Number of Occurrences:   1    Standing Expiration Date:   12/04/2022   All questions were answered. The patient knows to call the clinic with any problems, questions or concerns. No barriers to learning was detected. The total time spent in the appointment was 30 minutes.     Truitt Merle, MD 11/19/2021   I, Wilburn Mylar, am acting as scribe for Truitt Merle, MD.   I have reviewed the above documentation for accuracy and completeness, and I agree with the above.

## 2021-11-20 ENCOUNTER — Telehealth: Payer: Self-pay | Admitting: Hematology

## 2021-11-20 NOTE — Telephone Encounter (Signed)
Scheduled follow-up appointment per 8/31 los. Patient is aware.

## 2021-11-21 ENCOUNTER — Inpatient Hospital Stay: Payer: Medicare Other | Attending: Nurse Practitioner

## 2021-11-21 VITALS — BP 111/64 | HR 68 | Temp 97.3°F | Resp 17

## 2021-11-21 DIAGNOSIS — I129 Hypertensive chronic kidney disease with stage 1 through stage 4 chronic kidney disease, or unspecified chronic kidney disease: Secondary | ICD-10-CM | POA: Insufficient documentation

## 2021-11-21 DIAGNOSIS — Z5111 Encounter for antineoplastic chemotherapy: Secondary | ICD-10-CM | POA: Insufficient documentation

## 2021-11-21 DIAGNOSIS — T451X5A Adverse effect of antineoplastic and immunosuppressive drugs, initial encounter: Secondary | ICD-10-CM | POA: Diagnosis not present

## 2021-11-21 DIAGNOSIS — D6959 Other secondary thrombocytopenia: Secondary | ICD-10-CM | POA: Insufficient documentation

## 2021-11-21 DIAGNOSIS — N189 Chronic kidney disease, unspecified: Secondary | ICD-10-CM | POA: Insufficient documentation

## 2021-11-21 DIAGNOSIS — E039 Hypothyroidism, unspecified: Secondary | ICD-10-CM | POA: Diagnosis not present

## 2021-11-21 DIAGNOSIS — Z79899 Other long term (current) drug therapy: Secondary | ICD-10-CM | POA: Diagnosis not present

## 2021-11-21 DIAGNOSIS — E1122 Type 2 diabetes mellitus with diabetic chronic kidney disease: Secondary | ICD-10-CM | POA: Insufficient documentation

## 2021-11-21 DIAGNOSIS — C182 Malignant neoplasm of ascending colon: Secondary | ICD-10-CM | POA: Diagnosis not present

## 2021-11-21 DIAGNOSIS — D500 Iron deficiency anemia secondary to blood loss (chronic): Secondary | ICD-10-CM | POA: Diagnosis not present

## 2021-11-21 DIAGNOSIS — K219 Gastro-esophageal reflux disease without esophagitis: Secondary | ICD-10-CM | POA: Insufficient documentation

## 2021-11-21 MED ORDER — HEPARIN SOD (PORK) LOCK FLUSH 100 UNIT/ML IV SOLN
500.0000 [IU] | Freq: Once | INTRAVENOUS | Status: AC | PRN
  Administered 2021-11-21: 500 [IU]

## 2021-11-21 MED ORDER — SODIUM CHLORIDE 0.9% FLUSH
10.0000 mL | INTRAVENOUS | Status: DC | PRN
  Administered 2021-11-21: 10 mL

## 2021-11-27 ENCOUNTER — Other Ambulatory Visit: Payer: Self-pay

## 2021-11-27 ENCOUNTER — Inpatient Hospital Stay: Payer: Medicare Other

## 2021-11-27 DIAGNOSIS — E039 Hypothyroidism, unspecified: Secondary | ICD-10-CM | POA: Diagnosis not present

## 2021-11-27 DIAGNOSIS — T451X5A Adverse effect of antineoplastic and immunosuppressive drugs, initial encounter: Secondary | ICD-10-CM | POA: Diagnosis not present

## 2021-11-27 DIAGNOSIS — D500 Iron deficiency anemia secondary to blood loss (chronic): Secondary | ICD-10-CM | POA: Diagnosis not present

## 2021-11-27 DIAGNOSIS — D6959 Other secondary thrombocytopenia: Secondary | ICD-10-CM | POA: Diagnosis not present

## 2021-11-27 DIAGNOSIS — C182 Malignant neoplasm of ascending colon: Secondary | ICD-10-CM | POA: Diagnosis not present

## 2021-11-27 DIAGNOSIS — Z95828 Presence of other vascular implants and grafts: Secondary | ICD-10-CM

## 2021-11-27 DIAGNOSIS — Z5111 Encounter for antineoplastic chemotherapy: Secondary | ICD-10-CM | POA: Diagnosis not present

## 2021-11-27 LAB — CBC WITH DIFFERENTIAL (CANCER CENTER ONLY)
Abs Immature Granulocytes: 0.01 10*3/uL (ref 0.00–0.07)
Basophils Absolute: 0 10*3/uL (ref 0.0–0.1)
Basophils Relative: 0 %
Eosinophils Absolute: 0.1 10*3/uL (ref 0.0–0.5)
Eosinophils Relative: 3 %
HCT: 28.8 % — ABNORMAL LOW (ref 39.0–52.0)
Hemoglobin: 9.8 g/dL — ABNORMAL LOW (ref 13.0–17.0)
Immature Granulocytes: 0 %
Lymphocytes Relative: 22 %
Lymphs Abs: 0.8 10*3/uL (ref 0.7–4.0)
MCH: 29.6 pg (ref 26.0–34.0)
MCHC: 34 g/dL (ref 30.0–36.0)
MCV: 87 fL (ref 80.0–100.0)
Monocytes Absolute: 0.4 10*3/uL (ref 0.1–1.0)
Monocytes Relative: 10 %
Neutro Abs: 2.4 10*3/uL (ref 1.7–7.7)
Neutrophils Relative %: 65 %
Platelet Count: 90 10*3/uL — ABNORMAL LOW (ref 150–400)
RBC: 3.31 MIL/uL — ABNORMAL LOW (ref 4.22–5.81)
RDW: 19.6 % — ABNORMAL HIGH (ref 11.5–15.5)
WBC Count: 3.7 10*3/uL — ABNORMAL LOW (ref 4.0–10.5)
nRBC: 0 % (ref 0.0–0.2)

## 2021-11-27 LAB — CMP (CANCER CENTER ONLY)
ALT: 10 U/L (ref 0–44)
AST: 15 U/L (ref 15–41)
Albumin: 3.7 g/dL (ref 3.5–5.0)
Alkaline Phosphatase: 74 U/L (ref 38–126)
Anion gap: 6 (ref 5–15)
BUN: 22 mg/dL (ref 8–23)
CO2: 25 mmol/L (ref 22–32)
Calcium: 9.4 mg/dL (ref 8.9–10.3)
Chloride: 105 mmol/L (ref 98–111)
Creatinine: 1.07 mg/dL (ref 0.61–1.24)
GFR, Estimated: >60 mL/min (ref >60)
Glucose, Bld: 188 mg/dL — ABNORMAL HIGH (ref 70–99)
Potassium: 4.1 mmol/L (ref 3.5–5.1)
Sodium: 136 mmol/L (ref 135–145)
Total Bilirubin: 0.5 mg/dL (ref 0.3–1.2)
Total Protein: 6.5 g/dL (ref 6.5–8.1)

## 2021-11-27 MED ORDER — SODIUM CHLORIDE 0.9% FLUSH
10.0000 mL | INTRAVENOUS | Status: AC | PRN
  Administered 2021-11-27: 10 mL

## 2021-11-27 MED ORDER — HEPARIN SOD (PORK) LOCK FLUSH 100 UNIT/ML IV SOLN
500.0000 [IU] | INTRAVENOUS | Status: AC | PRN
  Administered 2021-11-27: 500 [IU]

## 2021-12-02 MED FILL — Dexamethasone Sodium Phosphate Inj 100 MG/10ML: INTRAMUSCULAR | Qty: 1 | Status: AC

## 2021-12-03 ENCOUNTER — Inpatient Hospital Stay (HOSPITAL_BASED_OUTPATIENT_CLINIC_OR_DEPARTMENT_OTHER): Payer: Medicare Other | Admitting: Hematology

## 2021-12-03 ENCOUNTER — Inpatient Hospital Stay: Payer: Medicare Other

## 2021-12-03 ENCOUNTER — Encounter: Payer: Self-pay | Admitting: Hematology

## 2021-12-03 VITALS — BP 136/72 | HR 63 | Temp 97.7°F | Resp 18 | Ht 73.0 in | Wt 207.4 lb

## 2021-12-03 DIAGNOSIS — C182 Malignant neoplasm of ascending colon: Secondary | ICD-10-CM

## 2021-12-03 DIAGNOSIS — T451X5A Adverse effect of antineoplastic and immunosuppressive drugs, initial encounter: Secondary | ICD-10-CM | POA: Diagnosis not present

## 2021-12-03 DIAGNOSIS — E039 Hypothyroidism, unspecified: Secondary | ICD-10-CM | POA: Diagnosis not present

## 2021-12-03 DIAGNOSIS — D6959 Other secondary thrombocytopenia: Secondary | ICD-10-CM | POA: Diagnosis not present

## 2021-12-03 DIAGNOSIS — Z5111 Encounter for antineoplastic chemotherapy: Secondary | ICD-10-CM | POA: Diagnosis not present

## 2021-12-03 DIAGNOSIS — D500 Iron deficiency anemia secondary to blood loss (chronic): Secondary | ICD-10-CM | POA: Diagnosis not present

## 2021-12-03 LAB — CBC WITH DIFFERENTIAL (CANCER CENTER ONLY)
Abs Immature Granulocytes: 0.01 10*3/uL (ref 0.00–0.07)
Basophils Absolute: 0 10*3/uL (ref 0.0–0.1)
Basophils Relative: 1 %
Eosinophils Absolute: 0.1 10*3/uL (ref 0.0–0.5)
Eosinophils Relative: 1 %
HCT: 30.6 % — ABNORMAL LOW (ref 39.0–52.0)
Hemoglobin: 10.1 g/dL — ABNORMAL LOW (ref 13.0–17.0)
Immature Granulocytes: 0 %
Lymphocytes Relative: 21 %
Lymphs Abs: 0.9 10*3/uL (ref 0.7–4.0)
MCH: 30 pg (ref 26.0–34.0)
MCHC: 33 g/dL (ref 30.0–36.0)
MCV: 90.8 fL (ref 80.0–100.0)
Monocytes Absolute: 0.4 10*3/uL (ref 0.1–1.0)
Monocytes Relative: 9 %
Neutro Abs: 2.9 10*3/uL (ref 1.7–7.7)
Neutrophils Relative %: 68 %
Platelet Count: 78 10*3/uL — ABNORMAL LOW (ref 150–400)
RBC: 3.37 MIL/uL — ABNORMAL LOW (ref 4.22–5.81)
RDW: 21.5 % — ABNORMAL HIGH (ref 11.5–15.5)
WBC Count: 4.2 10*3/uL (ref 4.0–10.5)
nRBC: 0 % (ref 0.0–0.2)

## 2021-12-03 LAB — CMP (CANCER CENTER ONLY)
ALT: 15 U/L (ref 0–44)
AST: 20 U/L (ref 15–41)
Albumin: 3.8 g/dL (ref 3.5–5.0)
Alkaline Phosphatase: 76 U/L (ref 38–126)
Anion gap: 4 — ABNORMAL LOW (ref 5–15)
BUN: 21 mg/dL (ref 8–23)
CO2: 27 mmol/L (ref 22–32)
Calcium: 9.6 mg/dL (ref 8.9–10.3)
Chloride: 106 mmol/L (ref 98–111)
Creatinine: 1.1 mg/dL (ref 0.61–1.24)
GFR, Estimated: >60 mL/min (ref >60)
Glucose, Bld: 133 mg/dL — ABNORMAL HIGH (ref 70–99)
Potassium: 4.1 mmol/L (ref 3.5–5.1)
Sodium: 137 mmol/L (ref 135–145)
Total Bilirubin: 0.5 mg/dL (ref 0.3–1.2)
Total Protein: 6.4 g/dL — ABNORMAL LOW (ref 6.5–8.1)

## 2021-12-03 MED ORDER — FLUOROURACIL CHEMO INJECTION 2.5 GM/50ML
400.0000 mg/m2 | Freq: Once | INTRAVENOUS | Status: AC
  Administered 2021-12-03: 850 mg via INTRAVENOUS
  Filled 2021-12-03: qty 17

## 2021-12-03 MED ORDER — SODIUM CHLORIDE 0.9 % IV SOLN
10.0000 mg | Freq: Once | INTRAVENOUS | Status: AC
  Administered 2021-12-03: 10 mg via INTRAVENOUS
  Filled 2021-12-03: qty 10

## 2021-12-03 MED ORDER — LEUCOVORIN CALCIUM INJECTION 350 MG
400.0000 mg/m2 | Freq: Once | INTRAVENOUS | Status: AC
  Administered 2021-12-03: 840 mg via INTRAVENOUS
  Filled 2021-12-03: qty 25

## 2021-12-03 MED ORDER — PALONOSETRON HCL INJECTION 0.25 MG/5ML
0.2500 mg | Freq: Once | INTRAVENOUS | Status: AC
  Administered 2021-12-03: 0.25 mg via INTRAVENOUS
  Filled 2021-12-03: qty 5

## 2021-12-03 MED ORDER — DEXTROSE 5 % IV SOLN: Freq: Once | INTRAVENOUS | Status: AC

## 2021-12-03 MED ORDER — SODIUM CHLORIDE 0.9% FLUSH
10.0000 mL | Freq: Once | INTRAVENOUS | Status: AC
  Administered 2021-12-03: 10 mL

## 2021-12-03 MED ORDER — SODIUM CHLORIDE 0.9 % IV SOLN
2000.0000 mg/m2 | INTRAVENOUS | Status: DC
  Administered 2021-12-03: 4200 mg via INTRAVENOUS
  Filled 2021-12-03: qty 84

## 2021-12-03 MED ORDER — OXALIPLATIN CHEMO INJECTION 100 MG/20ML
70.0000 mg/m2 | Freq: Once | INTRAVENOUS | Status: AC
  Administered 2021-12-03: 145 mg via INTRAVENOUS
  Filled 2021-12-03: qty 20

## 2021-12-03 NOTE — Progress Notes (Signed)
Per Dr Burr Medico note, "Labs reviewed, stable with hgb 10.1 and plt 78k. Will continue treatment."

## 2021-12-03 NOTE — Progress Notes (Signed)
[place]   [phone] [phone]   Clinic Follow up Note   Patient Care Team: Joseph Frees, MD as PCP - General (Family Medicine) Joseph Merle, MD as Consulting Physician (Hematology and Oncology)  Date of Service:  12/03/2021  CHIEF COMPLAINT: f/u of colon cancer  CURRENT THERAPY:  Adjuvant FOLFOX, q14d, starting 10/22/21  ASSESSMENT & PLAN:  Joseph Pennington is a 88 y.o. male with   1. 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 Colonoscopy 07/23/21 showed 15 cm partially obstructing mass, path confirmed adenocarcinoma -staging CT CAP 07/30/21 showed: prominent but not pathologically enlarged local LN's; indeterminate groud glass opacity in right lung base; no distant mets. -Baseline CEA was normal, 1.35 (08/05/21) -s/p open right colectomy by Dr. Dema Pennington 09/14/21, surgical path shows 9 cm poorly differentiated adenocarcinoma in ascending colon extending through muscular wall, lymph nodes negative. Dr. Dema Pennington notes part of the tumor was wide open resulting in a defect in the colon in an area overlying the right kidney and lateral most part of the duodenum, consistent with a positive margin. -Molecular features showed loss of MLH1 and PMS2 with BRAF mutation and MLH1 hyper methylation overall indicative of a sporadic cancer rather than a germline/Lynch syndrome associated cancer. -he began adjuvant FOLFOX on 10/22/21. Plan for concurrent chemoRT with Xeloda after 3 months adjuvant chemo -he is tolerating treatment well overall with some fatigue and weakness. Labs reviewed, stable with hgb 10.1 and plt 78k. Will continue treatment. -Due to his persistent moderate thrombocytopenia, I will postpone his next cycle of chemotherapy for a week to allow him to recover better   2. IDA  secondary to blood loss from #1, low-normal range B12 -hgb slowly started dropping from 02/2018 -further work up 04/12/21 showed normal retic, folate, B12 low-normal range 275, serum iron 34, 10% saturation, ferritin 46, and TIBC 338. C/w mild IDA  -he began oral B12 and oral iron in 05/2021.  -He received IV Feraheme, 1 unit of blood, and vit B12 while hospitalized for surgery -he was given IV Feraheme with first two cycles FOLFOX, hgb stable at 10.1 today (12/03/21)   3. Thrombocytopenia  -Secondary to chemotherapy -will monitor closely, he knows to call us if he noticed bleeding.  4. Hypothyroidism, HTN, pre-DM, GERD, CKD -on lisinopril and synthroid. Does not take many medications -SCr up to 2.2 in 2019 but has normalized since then -per PCP    5. Social  -Joseph Pennington is the primary care giver for his wife who has Parkinson's. They live in a 2 story home and have a farm house that is ADA approved -pt is independent with ADLs.  -he has 2 adult children who live locally and have been strong support system during this time      Plan: -proceed with C4 FOLFOX with same dose reduction as cycle 1 and 2 -lab, flush, f/u, and FOLFOX in 3 and 5 weeks   No problem-specific Assessment & Plan notes found for this encounter.   SUMMARY OF ONCOLOGIC HISTORY: Oncology History  Ascending colon cancer s/p lap colectomy 09/14/2021  07/23/2021 Procedure   Upper GI Endoscopy/Colonoscopy; Dr. Lorenso Pennington  Colonoscopy Impression: - Likely malignant partially obstructing tumor in the ascending colon. Biopsied. - Six 3 to 8 mm polyps in the rectum and in the transverse colon, removed with a cold snare.  Resected and retrieved. - Non-bleeding internal hemorrhoids.  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 Biopsied. - Multiple gastric polyps. Resected and retrieved. - Erythematous mucosa in the antrum. Biopsied. - Mucosal nodule found in the  duodenum. Biopsied. - Dilated lacteals were found in the duodenum. Biopsied. - Two non-bleeding angioectasias in the duodenum.   07/23/2021 Initial Biopsy   Diagnosis 1. Duodenum, Biopsy - BENIGN DUODENAL MUCOSA - NO ACUTE INFLAMMATION, VILLOUS BLUNTING OR INCREASED INTRAEPITHELIAL LYMPHOCYTES IDENTIFIED 2. Duodenum, Biopsy, Nodule - PEPTIC DUODENITIS - NO DYSPLASIA OR MALIGNANCY IDENTIFIED 3. Stomach, biopsy - MILD CHRONIC GASTRITIS WITHOUT ACTIVITY - NO INTESTINAL METAPLASIA IDENTIFIED - SEE COMMENT 4. Stomach, polyp(s) - HYPERPLASTIC GASTRIC POLYP(S) WITH INTESTINAL METAPLASIA - NO H. PYLORI OR MALIGNANCY IDENTIFIED 5. Esophagogastric junction, biopsy - SQUAMOCOLUMNAR JUNCTION WITH CHRONIC INFLAMMATION - NO INTESTINAL METAPLASIA, DYSPLASIA OR MALIGNANCY IDENTIFIED 6. Rectum, biopsy, and transverse - MULTIPLE FRAGMENTS OF HYPERPLASTIC POLYP(S) - NO HIGH-GRADE DYSPLASIA OR MALIGNANCY IDENTIFIED 7. Ascending Colon Biopsy, Mass - POORLY DIFFERENTIATED ADENOCARCINOMA - SEE COMMENT  Microscopic Comment 7. By immunohistochemistry, the neoplastic cells are positive for CDX2 (patchy, weak) but negative for p63, p40, CD56, chromogranin and synaptophysin. This immunoprofile is consistent with a poorly differentiated adenocarcinoma.  3. H. pylori immunohistochemistry is POSITIVE for RARE microorganisms.   07/30/2021 Imaging   EXAM: CT CHEST, ABDOMEN, AND PELVIS WITH CONTRAST  IMPRESSION: 1. Masslike region in the ascending colon extending to the level of the hepatic flexure in the right lower quadrant, concerning for malignancy. Associated pericolonic fat stranding and a few prominent lymph nodes are seen adjacent to this segment of colon, suspicious for local infiltration. No distant metastasis is seen. 2. Left inguinal hernia containing nonobstructed colon. 3. Nonspecific small ground-glass opacity in the in the right lower lobe. Attention on follow-up is recommended. 4. Small  hiatal hernia. 5. Aortic atherosclerosis and coronary artery calcifications   08/06/2021 Initial Diagnosis   Malignant neoplasm of ascending colon [facility])   Primary adenocarcinoma of ascending colon ([facility])  07/23/2021 Procedure   Colonoscopy by Dr. Lorenso Pennington - A frond-like/villous, fungating and ulcerated partially obstructing large mass was found in the ascending colon. There was a point at which the mass could not be bypassed, even with use of an ultraslim colonoscope. It is suspected that this mass encompasses the entire cecum and ascending colon. The mass was circumferential. The mass measured fifteen cm in length. Oozing was present.    07/23/2021 Initial Biopsy   3. H. pylori immunohistochemistry is POSITIVE for RARE microorganisms.  FINAL DIAGNOSIS Diagnosis 1. Duodenum, Biopsy - BENIGN DUODENAL MUCOSA - NO ACUTE INFLAMMATION, VILLOUS BLUNTING OR INCREASED INTRAEPITHELIAL LYMPHOCYTES IDENTIFIED 2. Duodenum, Biopsy, Nodule - PEPTIC DUODENITIS - NO DYSPLASIA OR MALIGNANCY IDENTIFIED 3. Stomach, biopsy - MILD CHRONIC GASTRITIS WITHOUT ACTIVITY - NO INTESTINAL METAPLASIA IDENTIFIED - SEE COMMENT 4. Stomach, polyp(s) - HYPERPLASTIC GASTRIC POLYP(S) WITH INTESTINAL METAPLASIA - NO H. PYLORI OR MALIGNANCY IDENTIFIED 5. Esophagogastric junction, biopsy - SQUAMOCOLUMNAR JUNCTION WITH CHRONIC INFLAMMATION - NO INTESTINAL METAPLASIA, DYSPLASIA OR MALIGNANCY IDENTIFIED 6. Rectum, biopsy, and transverse - MULTIPLE FRAGMENTS OF HYPERPLASTIC POLYP(S) - NO HIGH-GRADE DYSPLASIA OR MALIGNANCY IDENTIFIED 7. Ascending Colon Biopsy, Mass - POORLY DIFFERENTIATED ADENOCARCINOMA - SEE COMMENT   07/30/2021 Imaging   CT CAP IMPRESSION: 1. Masslike region in the ascending colon extending to the level of the hepatic flexure in the right lower quadrant, concerning for malignancy. Associated pericolonic fat stranding and a few prominent lymph nodes are seen adjacent to this  segment of colon, suspicious for  local infiltration. No distant metastasis is seen. 2. Left inguinal hernia containing nonobstructed colon. 3. Nonspecific small ground-glass opacity in the in the right lower lobe. Attention on follow-up is recommended. 4. Small hiatal hernia. 5. Aortic atherosclerosis and coronary artery calcifications   09/14/2021 Surgery   PROCEDURE:  Laparoscopic converted to open right hemicolectomy Repair of small bowel serosa x 2     09/14/2021 Pathology Results   A. COLON, RIGHT, HEMI COLECTOMY:  Poorly differentiated adenocarcinoma in the ascending colon with clear margins of resection.  Tumor Size: Circumferential measuring 9.0 cm X 8.0 cm. X 2.5 cm.  Macroscopic Tumor Perforation: Not identified  Histologic Type: Adenocarcinoma.  Histologic Grade: Grade 3, poorly differentiated.  Multiple Primary Sites: Not applicable  Tumor Extension: Extends through the muscular wall into the pericolonic adipose tissue.  Lymphovascular Invasion: Not identified.  Perineural Invasion: Not identified.  Treatment Effect: No known presurgical therapy.  Margins:       Margin Status for Invasive Carcinoma: All margins are negative for invasive carcinoma       Distance from Invasive Carcinoma to proximal margin: 11 cm.       Distance from invasive carcinoma to distal margin: 12 cm.  Regional Lymph Nodes:       Number of Lymph Nodes with Tumor: None.       Number of Lymph Nodes Examined: Twenty-two (22)  Tumor Deposits: Present (slide A11)  Distant Metastasis:       Distant Site(s) Involved: None known.  Pathologic Stage Classification (pTNM, AJCC 8th Edition): pT3 pN1c  Comments: Omentum is free of neoplastic invasion.                      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LAMN)    09/14/2021 Cancer Staging   Staging form: Colon and Rectum, AJCC 8th Edition - Pathologic stage from 09/14/2021: Stage IIIB (pT3, pN1c, cM0) - Signed by Pollyann Samples, NP on 10/08/2021 Stage prefix: Initial  diagnosis Total positive nodes: 0 Histologic grading system: 4 grade system Histologic grade (G): G3 Laterality: Right Lymph-vascular invasion (LVI): LVI not present (absent)/not identified Tumor deposits (TD): Present Perineural invasion (PNI): Absent Microsatellite instability (MSI): Unstable high BRAF mutation: Positive   10/08/2021 Initial Diagnosis   Primary adenocarcinoma of ascending colon (HCC)   10/22/2021 - 11/07/2021 Chemotherapy   Patient is on Treatment Plan : COLORECTAL FOLFOX q14d x 3 months     10/22/2021 -  Chemotherapy   Patient is on Treatment Plan : COLORECTAL FOLFOX q14d x 3 months        INTERVAL HISTORY:  Joseph Pennington is here for a follow up of colon cancer. He was last seen by me on 11/19/21. He presents to the clinic accompanied by his daughter. He reports he continues to tolerate treatment well overall. He notes he did not have any nausea. 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 He denies neuropathy or bleeding.   All other systems were reviewed with the patient and are negative.  MEDICAL HISTORY:  Past Medical History:  Diagnosis Date   Acute cholecystitis 02/19/2018   Anemia    Chronic kidney disease    Colon cancer (HCC)    Diabetes mellitus without complication (HCC)    diet controlled   GERD (gastroesophageal reflux disease)    History of blood transfusion 08/2021   History of hiatal hernia    Hypertension  Hypothyroidism    Iron deficiency anemia    Peripheral neuropathy    bilateral feet    SURGICAL HISTORY: Past Surgical History:  Procedure Laterality Date   CHOLECYSTECTOMY N/A 02/20/2018   Procedure: LAPAROSCOPIC CHOLECYSTECTOMY;  Surgeon: Excell Seltzer, MD;  Location: [facility];  Service: General;  Laterality: N/A;   COLONOSCOPY  2023   LAPAROSCOPIC RIGHT HEMI COLECTOMY Right 09/14/2021   Procedure: LAPAROSCOPIC TO OPEN RIGHT HEMI COLECTOMY;  Surgeon: Ileana Roup, MD;  Location: [facility];  Service: General;  Laterality: Right;   PORTACATH PLACEMENT N/A 10/15/2021   Procedure: INSERTION PORT-A-CATH;  Surgeon: Ileana Roup, MD;  Location: [facility];  Service: General;  Laterality: N/A;  with ultrasound and flouroscopic guidance    I have reviewed the social history and family history with the patient and they are unchanged from previous note.  ALLERGIES:  has No Known Allergies.  MEDICATIONS:  Current Outpatient Medications  Medication Sig Dispense Refill   acetaminophen (TYLENOL) 500 MG tablet Take 500-1,000 mg by mouth every 6 (six) hours as needed for moderate pain.     ARTIFICIAL TEAR SOLUTION OP Place 1 drop into both eyes daily as needed (dry eyes).     cyanocobalamin (,VITAMIN B-12,) 1000 MCG/ML injection Inject 1,000 mcg into the muscle every 30 (thirty) days.   0   Iron, Ferrous Sulfate, 325 (65 Fe) MG TABS Take 325 mg by mouth daily.     levothyroxine (SYNTHROID) 75 MCG tablet Take 75 mcg by mouth daily before breakfast.     lisinopril (PRINIVIL,ZESTRIL) 20 MG tablet Take 20 mg by mouth daily.  3   polyethylene glycol powder (GLYCOLAX/MIRALAX) 17 GM/SCOOP powder Take 17 g by mouth as needed for mild constipation. (Patient taking differently: Take 17 g by mouth daily as needed for mild constipation.)     traMADol (ULTRAM) 50 MG tablet Take 1-2 tablets (50-100 mg total) by mouth every 6 (six) hours as needed (tylenol first). (Patient not taking: Reported on 10/28/2021) 10 tablet 0   vitamin C (ASCORBIC ACID) 500 MG tablet Take 500 mg by mouth daily.     No current facility-administered medications for this visit.    PHYSICAL EXAMINATION: ECOG PERFORMANCE STATUS: 1 - Symptomatic but completely ambulatory  Vitals:   12/03/21 1029  BP: 136/72  Pulse: 63  Resp: 18  Temp: 97.7 F (36.5 C)  SpO2: 100%   Wt Readings from Last 3 Encounters:  12/03/21 207 lb 6.4 oz (94.1 kg)  11/19/21 203 lb 3.2 oz (92.2 kg)  11/04/21 203 lb 12.8 oz  (92.4 kg)     GENERAL:alert, no distress and comfortable SKIN: skin color normal, no rashes or significant lesions EYES: normal, Conjunctiva are pink and non-injected, sclera clear  NEURO: alert & oriented x 3 with fluent speech  LABORATORY DATA:  I have reviewed the data as listed    Latest Ref Rng & Units 12/03/2021   10:14 AM 11/27/2021    7:53 AM 11/19/2021    8:45 AM  CBC  WBC 4.0 - 10.5 K/uL 4.2  3.7  4.7   Hemoglobin 13.0 - 17.0 g/dL 10.1  9.8  10.0   Hematocrit 39.0 - 52.0 % 30.6  28.8  30.2   Platelets 150 - 400 K/uL 78  90  86         Latest Ref Rng & Units 12/03/2021   10:14 AM 11/27/2021    7:53 AM 11/19/2021    8:45 AM  CMP  Glucose 70 -  99 mg/dL 133  188  207   BUN 8 - 23 mg/dL $Remove'21  22  26   'SYGbENG$ Creatinine 0.61 - 1.24 mg/dL 1.10  1.07  1.18   Sodium 135 - 145 mmol/L 137  136  135   Potassium 3.5 - 5.1 mmol/L 4.1  4.1  4.1   Chloride 98 - 111 mmol/L 106  105  105   CO2 22 - 32 mmol/L $RemoveB'27  25  27   'YzKLZbTo$ Calcium 8.9 - 10.3 mg/dL 9.6  9.4  9.6   Total Protein 6.5 - 8.1 g/dL 6.4  6.5  6.7   Total Bilirubin 0.3 - 1.2 mg/dL 0.5  0.5  0.4   Alkaline Phos 38 - 126 U/L 76  74  71   AST 15 - 41 U/L $Remo'20  15  15   'WCaWA$ ALT 0 - 44 U/L $Remo'15  10  10       'rcAza$ RADIOGRAPHIC STUDIES: I have personally reviewed the radiological images as listed and agreed with the findings in the report. No results found.    Orders Placed This Encounter  Procedures   CBC with Differential ([facility] Only)    Standing Status:   Future    Standing Expiration Date:   12/25/2022   CMP ([facility] only)    Standing Status:   Future    Standing Expiration Date:   12/25/2022   CBC with Differential ([facility] Only)    Standing Status:   Future    Standing Expiration Date:   01/08/2023   CMP ([facility] only)    Standing Status:   Future    Standing Expiration Date:   01/08/2023   All questions were answered. The patient knows to call the clinic with any problems, questions or concerns. No barriers  to learning was detected. The total time spent in the appointment was 30 minutes.     Joseph Merle, MD 12/03/2021   I, Wilburn Mylar, am acting as scribe for Joseph Merle, MD.   I have reviewed the above documentation for accuracy and completeness, and I agree with the above.

## 2021-12-03 NOTE — Patient Instructions (Signed)
[facility]  Discharge Instructions: Thank you for choosing [facility] to provide your oncology and hematology care.   If you have a lab appointment with the [facility], please go directly to the [facility] and check in at the registration area.   Wear comfortable clothing and clothing appropriate for easy access to any Portacath or PICC line.   We strive to give you quality time with your provider. You may need to reschedule your appointment if you arrive late (15 or more minutes).  Arriving late affects you and other patients whose appointments are after yours.  Also, if you miss three or more appointments without notifying the office, you may be dismissed from the clinic at the provider's discretion.      For prescription refill requests, have your pharmacy contact our office and allow 72 hours for refills to be completed.    Today you received the following chemotherapy and/or immunotherapy agents: oxaliplatin and fluorouracil       To help prevent nausea and vomiting after your treatment, we encourage you to take your nausea medication as directed.  BELOW ARE SYMPTOMS THAT SHOULD BE REPORTED IMMEDIATELY: *FEVER GREATER THAN 100.4 F (38 C) OR HIGHER *CHILLS OR SWEATING *NAUSEA AND VOMITING THAT IS NOT CONTROLLED WITH YOUR NAUSEA MEDICATION *UNUSUAL SHORTNESS OF BREATH *UNUSUAL BRUISING OR BLEEDING *URINARY PROBLEMS (pain or burning when urinating, or frequent urination) *BOWEL PROBLEMS (unusual diarrhea, constipation, pain near the anus) TENDERNESS IN MOUTH AND THROAT WITH OR WITHOUT PRESENCE OF ULCERS (sore throat, sores in mouth, or a toothache) UNUSUAL RASH, SWELLING OR PAIN  UNUSUAL VAGINAL DISCHARGE OR ITCHING   Items with * indicate a potential emergency and should be followed up as soon as possible or go to the Emergency Department if any problems should occur.  Please show the CHEMOTHERAPY ALERT CARD or IMMUNOTHERAPY ALERT  CARD at check-in to the Emergency Department and triage nurse.  Should you have questions after your visit or need to cancel or reschedule your appointment, please contact [facility]  Dept: [phone]  and follow the prompts.  Office hours are 8:00 a.m. to 4:30 p.m. Monday - Friday. Please note that voicemails left after 4:00 p.m. may not be returned until the following business day.  We are closed weekends and major holidays. You have access to a nurse at all times for urgent questions. Please call the main number to the clinic Dept: [phone] and follow the prompts.   For any non-urgent questions, you may also contact your provider using MyChart. We now offer e-Visits for anyone 45 and older to request care online for non-urgent symptoms. For details visit [email or web address].   Also download the MyChart app! Go to the app store, search "MyChart", open the app, select [facility], and log in with your MyChart username and password.  Masks are optional in the cancer centers. If you would like for your care team to wear a mask while they are taking care of you, please let them know. You may have one support person who is at least 86 years old accompany you for your appointments.

## 2021-12-03 NOTE — Progress Notes (Signed)
Dose increase of 5-FU and Oxal is intentional per MD

## 2021-12-04 ENCOUNTER — Other Ambulatory Visit: Payer: Self-pay

## 2021-12-04 ENCOUNTER — Telehealth: Payer: Self-pay | Admitting: Hematology

## 2021-12-04 NOTE — Telephone Encounter (Signed)
Scheduled follow-up appointments per appointment request workqueue. Patient is aware. 

## 2021-12-05 ENCOUNTER — Inpatient Hospital Stay: Payer: Medicare Other

## 2021-12-05 VITALS — BP 122/67 | HR 62 | Temp 97.7°F | Resp 17

## 2021-12-05 DIAGNOSIS — Z5111 Encounter for antineoplastic chemotherapy: Secondary | ICD-10-CM | POA: Diagnosis not present

## 2021-12-05 DIAGNOSIS — D500 Iron deficiency anemia secondary to blood loss (chronic): Secondary | ICD-10-CM | POA: Diagnosis not present

## 2021-12-05 DIAGNOSIS — D6959 Other secondary thrombocytopenia: Secondary | ICD-10-CM | POA: Diagnosis not present

## 2021-12-05 DIAGNOSIS — E039 Hypothyroidism, unspecified: Secondary | ICD-10-CM | POA: Diagnosis not present

## 2021-12-05 DIAGNOSIS — C182 Malignant neoplasm of ascending colon: Secondary | ICD-10-CM

## 2021-12-05 DIAGNOSIS — T451X5A Adverse effect of antineoplastic and immunosuppressive drugs, initial encounter: Secondary | ICD-10-CM | POA: Diagnosis not present

## 2021-12-05 MED ORDER — SODIUM CHLORIDE 0.9% FLUSH
10.0000 mL | INTRAVENOUS | Status: DC | PRN
  Administered 2021-12-05: 10 mL

## 2021-12-05 MED ORDER — HEPARIN SOD (PORK) LOCK FLUSH 100 UNIT/ML IV SOLN
500.0000 [IU] | Freq: Once | INTRAVENOUS | Status: AC | PRN
  Administered 2021-12-05: 500 [IU]

## 2021-12-23 MED FILL — Dexamethasone Sodium Phosphate Inj 100 MG/10ML: INTRAMUSCULAR | Qty: 1 | Status: AC

## 2021-12-24 ENCOUNTER — Encounter: Payer: Self-pay | Admitting: Hematology

## 2021-12-24 ENCOUNTER — Inpatient Hospital Stay (HOSPITAL_BASED_OUTPATIENT_CLINIC_OR_DEPARTMENT_OTHER): Payer: Medicare Other | Admitting: Hematology

## 2021-12-24 ENCOUNTER — Other Ambulatory Visit: Payer: Self-pay

## 2021-12-24 ENCOUNTER — Inpatient Hospital Stay: Payer: Medicare Other

## 2021-12-24 ENCOUNTER — Inpatient Hospital Stay: Payer: Medicare Other | Attending: Nurse Practitioner

## 2021-12-24 VITALS — BP 159/69 | Resp 56

## 2021-12-24 VITALS — BP 149/82 | HR 74 | Temp 97.6°F | Resp 18 | Ht 73.0 in | Wt 208.6 lb

## 2021-12-24 DIAGNOSIS — Z5111 Encounter for antineoplastic chemotherapy: Secondary | ICD-10-CM | POA: Insufficient documentation

## 2021-12-24 DIAGNOSIS — D500 Iron deficiency anemia secondary to blood loss (chronic): Secondary | ICD-10-CM

## 2021-12-24 DIAGNOSIS — T451X5A Adverse effect of antineoplastic and immunosuppressive drugs, initial encounter: Secondary | ICD-10-CM | POA: Insufficient documentation

## 2021-12-24 DIAGNOSIS — Z87891 Personal history of nicotine dependence: Secondary | ICD-10-CM | POA: Diagnosis not present

## 2021-12-24 DIAGNOSIS — E538 Deficiency of other specified B group vitamins: Secondary | ICD-10-CM | POA: Diagnosis not present

## 2021-12-24 DIAGNOSIS — C182 Malignant neoplasm of ascending colon: Secondary | ICD-10-CM

## 2021-12-24 DIAGNOSIS — D6959 Other secondary thrombocytopenia: Secondary | ICD-10-CM | POA: Insufficient documentation

## 2021-12-24 LAB — CBC WITH DIFFERENTIAL (CANCER CENTER ONLY)
Abs Immature Granulocytes: 0 10*3/uL (ref 0.00–0.07)
Basophils Absolute: 0 10*3/uL (ref 0.0–0.1)
Basophils Relative: 1 %
Eosinophils Absolute: 0.1 10*3/uL (ref 0.0–0.5)
Eosinophils Relative: 2 %
HCT: 32.6 % — ABNORMAL LOW (ref 39.0–52.0)
Hemoglobin: 11.2 g/dL — ABNORMAL LOW (ref 13.0–17.0)
Immature Granulocytes: 0 %
Lymphocytes Relative: 24 %
Lymphs Abs: 0.7 10*3/uL (ref 0.7–4.0)
MCH: 32.3 pg (ref 26.0–34.0)
MCHC: 34.4 g/dL (ref 30.0–36.0)
MCV: 93.9 fL (ref 80.0–100.0)
Monocytes Absolute: 0.5 10*3/uL (ref 0.1–1.0)
Monocytes Relative: 16 %
Neutro Abs: 1.7 10*3/uL (ref 1.7–7.7)
Neutrophils Relative %: 57 %
Platelet Count: 134 10*3/uL — ABNORMAL LOW (ref 150–400)
RBC: 3.47 MIL/uL — ABNORMAL LOW (ref 4.22–5.81)
RDW: 20.1 % — ABNORMAL HIGH (ref 11.5–15.5)
WBC Count: 2.9 10*3/uL — ABNORMAL LOW (ref 4.0–10.5)
nRBC: 0 % (ref 0.0–0.2)

## 2021-12-24 LAB — CMP (CANCER CENTER ONLY)
ALT: 14 U/L (ref 0–44)
AST: 21 U/L (ref 15–41)
Albumin: 3.8 g/dL (ref 3.5–5.0)
Alkaline Phosphatase: 91 U/L (ref 38–126)
Anion gap: 4 — ABNORMAL LOW (ref 5–15)
BUN: 14 mg/dL (ref 8–23)
CO2: 28 mmol/L (ref 22–32)
Calcium: 9.3 mg/dL (ref 8.9–10.3)
Chloride: 103 mmol/L (ref 98–111)
Creatinine: 0.99 mg/dL (ref 0.61–1.24)
GFR, Estimated: >60 mL/min
Glucose, Bld: 232 mg/dL — ABNORMAL HIGH (ref 70–99)
Potassium: 4.3 mmol/L (ref 3.5–5.1)
Sodium: 135 mmol/L (ref 135–145)
Total Bilirubin: 0.8 mg/dL (ref 0.3–1.2)
Total Protein: 7 g/dL (ref 6.5–8.1)

## 2021-12-24 MED ORDER — SODIUM CHLORIDE 0.9 % IV SOLN
2000.0000 mg/m2 | INTRAVENOUS | Status: DC
  Administered 2021-12-24: 4200 mg via INTRAVENOUS
  Filled 2021-12-24: qty 84

## 2021-12-24 MED ORDER — SODIUM CHLORIDE 0.9% FLUSH
10.0000 mL | Freq: Once | INTRAVENOUS | Status: AC | PRN
  Administered 2021-12-24: 10 mL

## 2021-12-24 MED ORDER — SODIUM CHLORIDE 0.9 % IV SOLN
10.0000 mg | Freq: Once | INTRAVENOUS | Status: AC
  Administered 2021-12-24: 10 mg via INTRAVENOUS
  Filled 2021-12-24: qty 10

## 2021-12-24 MED ORDER — LEUCOVORIN CALCIUM INJECTION 350 MG
400.0000 mg/m2 | Freq: Once | INTRAVENOUS | Status: AC
  Administered 2021-12-24: 840 mg via INTRAVENOUS
  Filled 2021-12-24: qty 25

## 2021-12-24 MED ORDER — OXALIPLATIN CHEMO INJECTION 100 MG/20ML
70.0000 mg/m2 | Freq: Once | INTRAVENOUS | Status: AC
  Administered 2021-12-24: 145 mg via INTRAVENOUS
  Filled 2021-12-24: qty 20

## 2021-12-24 MED ORDER — FLUOROURACIL CHEMO INJECTION 2.5 GM/50ML
400.0000 mg/m2 | Freq: Once | INTRAVENOUS | Status: AC
  Administered 2021-12-24: 850 mg via INTRAVENOUS
  Filled 2021-12-24: qty 17

## 2021-12-24 MED ORDER — DEXTROSE 5 % IV SOLN: Freq: Once | INTRAVENOUS | Status: AC

## 2021-12-24 MED ORDER — PALONOSETRON HCL INJECTION 0.25 MG/5ML
0.2500 mg | Freq: Once | INTRAVENOUS | Status: AC
  Administered 2021-12-24: 0.25 mg via INTRAVENOUS
  Filled 2021-12-24: qty 5

## 2021-12-24 NOTE — Progress Notes (Signed)
Joseph Pennington   [phone] [phone]   Clinic Follow up Note   Patient Care Team: Shirline Frees, MD as PCP - General (Family Medicine) Truitt Merle, MD as Consulting Physician (Hematology and Oncology)  Date of Service:  12/24/2021  CHIEF COMPLAINT: f/u of colon cancer  CURRENT THERAPY:  Adjuvant FOLFOX, q14d, starting 10/22/21  ASSESSMENT & PLAN:  Joseph Pennington is a 88 y.o. male with   1. 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 Colonoscopy 07/23/21 showed 15 cm partially obstructing mass, path confirmed adenocarcinoma -staging CT CAP 07/30/21 showed: prominent but not pathologically enlarged local LN's; indeterminate groud glass opacity in right lung base; no distant mets. -Baseline CEA was normal, 1.35 (08/05/21) -s/p open right colectomy by Dr. Dema Severin 09/14/21, surgical path shows 9 cm poorly differentiated adenocarcinoma in ascending colon extending through muscular wall, lymph nodes negative. Dr. Dema Severin notes part of the tumor was wide open resulting in a defect in the colon in an area overlying the right kidney and lateral most part of the duodenum, consistent with a positive margin. -Molecular features showed loss of MLH1 and PMS2 with BRAF mutation and MLH1 hyper methylation overall indicative of a sporadic cancer rather than a germline/Lynch syndrome associated cancer. -he began adjuvant FOLFOX on 10/22/21. Plan for concurrent chemoRT with Xeloda after 3 months adjuvant chemo -he is tolerating treatment well overall with some fatigue and weakness. Labs reviewed, improved with extra week off, showing hgb 11.2 and plt 134k. Will continue treatment.  -we reviewed his treatment plan-- he will complete two more cycles of FOLFOX (total of 6), then move to chemoradiation with  Xeloda.   2. 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 C/w mild IDA  -he began oral B12 and oral iron in 05/2021.  -He received IV Feraheme, 1 unit of blood, and vit B12 while hospitalized for surgery -he was given IV Feraheme with first two cycles FOLFOX, hgb improved to 11.2 today (12/24/21)   3. Thrombocytopenia  -Secondary to chemotherapy -improved today with the extra week off, 134k (12/24/21) -will monitor closely, he knows to call us if he noticed bleeding.     Plan: -proceed with C5 FOLFOX with same dose reduction -lab, flush, f/u, and final FOLFOX on 10/19 as scheduled -CT simulation on 11/6   No problem-specific Assessment & Plan notes found for this encounter.   SUMMARY OF ONCOLOGIC HISTORY: Oncology History  Ascending colon cancer s/p lap colectomy 09/14/2021  07/23/2021 Procedure   Upper GI Endoscopy/Colonoscopy; Dr. Lorenso Courier  Colonoscopy Impression: - Likely malignant partially obstructing tumor in the ascending colon. Biopsied. - Six 3 to 8 mm polyps in the rectum and in the transverse colon, removed with a cold snare. Resected and retrieved. - Non-bleeding internal hemorrhoids.  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 Biopsied. - Multiple gastric polyps. Resected and retrieved. - Erythematous mucosa in the antrum. Biopsied. - Mucosal nodule found in the duodenum. Biopsied. - Dilated lacteals were found in the duodenum. Biopsied. - Two non-bleeding angioectasias in the duodenum.   07/23/2021 Initial Biopsy   Diagnosis 1. Duodenum, Biopsy - BENIGN DUODENAL MUCOSA -  NO ACUTE INFLAMMATION, VILLOUS BLUNTING OR INCREASED INTRAEPITHELIAL LYMPHOCYTES IDENTIFIED 2. Duodenum, Biopsy, Nodule - PEPTIC DUODENITIS - NO  DYSPLASIA OR MALIGNANCY IDENTIFIED 3. Stomach, biopsy - MILD CHRONIC GASTRITIS WITHOUT ACTIVITY - NO INTESTINAL METAPLASIA IDENTIFIED - SEE COMMENT 4. Stomach, polyp(s) - HYPERPLASTIC GASTRIC POLYP(S) WITH INTESTINAL METAPLASIA - NO H. PYLORI OR MALIGNANCY IDENTIFIED 5. Esophagogastric junction, biopsy - SQUAMOCOLUMNAR JUNCTION WITH CHRONIC INFLAMMATION - NO INTESTINAL METAPLASIA, DYSPLASIA OR MALIGNANCY IDENTIFIED 6. Rectum, biopsy, and transverse - MULTIPLE FRAGMENTS OF HYPERPLASTIC POLYP(S) - NO HIGH-GRADE DYSPLASIA OR MALIGNANCY IDENTIFIED 7. Ascending Colon Biopsy, Mass - POORLY DIFFERENTIATED ADENOCARCINOMA - SEE COMMENT  Microscopic Comment 7. By immunohistochemistry, the neoplastic cells are positive for CDX2 (patchy, weak) but negative for p63, p40, CD56, chromogranin and synaptophysin. This immunoprofile is consistent with a poorly differentiated adenocarcinoma.  3. H. pylori immunohistochemistry is POSITIVE for RARE microorganisms.   07/30/2021 Imaging   EXAM: CT CHEST, ABDOMEN, AND PELVIS WITH CONTRAST  IMPRESSION: 1. Masslike region in the ascending colon extending to the level of the hepatic flexure in the right lower quadrant, concerning for malignancy. Associated pericolonic fat stranding and a few prominent lymph nodes are seen adjacent to this segment of colon, suspicious for local infiltration. No distant metastasis is seen. 2. Left inguinal hernia containing nonobstructed colon. 3. Nonspecific small ground-glass opacity in the in the right lower lobe. Attention on follow-up is recommended. 4. Small hiatal hernia. 5. Aortic atherosclerosis and coronary artery calcifications   08/06/2021 Initial Diagnosis   Malignant neoplasm of ascending colon [facility])   Primary adenocarcinoma of ascending colon ([place])  07/23/2021 Procedure   Colonoscopy by Dr. Lorenso Courier - A frond-like/villous, fungating and ulcerated partially obstructing large mass was found in the ascending  colon. There was a point at which the mass could not be bypassed, even with use of an ultraslim colonoscope. It is suspected that this mass encompasses the entire cecum and ascending colon. The mass was circumferential. The mass measured fifteen cm in length. Oozing was present.    07/23/2021 Initial Biopsy   3. H. pylori immunohistochemistry is POSITIVE for RARE microorganisms.  FINAL DIAGNOSIS Diagnosis 1. Duodenum, Biopsy - BENIGN DUODENAL MUCOSA - NO ACUTE INFLAMMATION, VILLOUS BLUNTING OR INCREASED INTRAEPITHELIAL LYMPHOCYTES IDENTIFIED 2. Duodenum, Biopsy, Nodule - PEPTIC DUODENITIS - NO DYSPLASIA OR MALIGNANCY IDENTIFIED 3. Stomach, biopsy - MILD CHRONIC GASTRITIS WITHOUT ACTIVITY - NO INTESTINAL METAPLASIA IDENTIFIED - SEE COMMENT 4. Stomach, polyp(s) - HYPERPLASTIC GASTRIC POLYP(S) WITH INTESTINAL METAPLASIA - NO H. PYLORI OR MALIGNANCY IDENTIFIED 5. Esophagogastric junction, biopsy - SQUAMOCOLUMNAR JUNCTION WITH CHRONIC INFLAMMATION - NO INTESTINAL METAPLASIA, DYSPLASIA OR MALIGNANCY IDENTIFIED 6. Rectum, biopsy, and transverse - MULTIPLE FRAGMENTS OF HYPERPLASTIC POLYP(S) - NO HIGH-GRADE DYSPLASIA OR MALIGNANCY IDENTIFIED 7. Ascending Colon Biopsy, Mass - POORLY DIFFERENTIATED ADENOCARCINOMA - SEE COMMENT   07/30/2021 Imaging   CT CAP IMPRESSION: 1. Masslike region in the ascending colon extending to the level of the hepatic flexure in the right lower quadrant, concerning for malignancy. Associated pericolonic fat stranding and a few prominent lymph nodes are seen adjacent to this segment of colon, suspicious for local infiltration. No distant metastasis is seen. 2. Left inguinal hernia containing nonobstructed colon. 3. Nonspecific small ground-glass opacity in the in the right lower lobe. Attention on follow-up is recommended. 4. Small hiatal hernia. 5. Aortic atherosclerosis and coronary artery calcifications   09/14/2021 Surgery   PROCEDURE:  Laparoscopic  converted to open right hemicolectomy Repair of small bowel serosa x 2     09/14/2021 Pathology Results  A. COLON, RIGHT, HEMI COLECTOMY:  Poorly differentiated adenocarcinoma in the ascending colon with clear margins of resection.  Tumor Size: Circumferential measuring 9.0 cm X 8.0 cm. X 2.5 cm.  Macroscopic Tumor Perforation: Not identified  Histologic Type: Adenocarcinoma.  Histologic Grade: Grade 3, poorly differentiated.  Multiple Primary Sites: Not applicable  Tumor Extension: Extends through the muscular wall into the pericolonic adipose tissue.  Lymphovascular Invasion: Not identified.  Perineural Invasion: Not identified.  Treatment Effect: No known presurgical therapy.  Margins:       Margin Status for Invasive Carcinoma: All margins are negative for invasive carcinoma       Distance from Invasive Carcinoma to proximal margin: 11 cm.       Distance from invasive carcinoma to distal margin: 12 cm.  Regional Lymph Nodes:       Number of Lymph Nodes with Tumor: None.       Number of Lymph Nodes Examined: Twenty-two (22)  Tumor Deposits: Present (slide A11)  Distant Metastasis:       Distant Site(s) Involved: None known.  Pathologic Stage Classification (pTNM, AJCC 8th Edition): pT3 pN1c  Comments: Omentum is free of neoplastic invasion.                      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LAMN)    09/14/2021 Cancer Staging   Staging form: Colon and Rectum, AJCC 8th Edition - Pathologic stage from 09/14/2021: Stage IIIB (pT3, pN1c, cM0) - Signed by Alla Feeling, NP on 10/08/2021 Stage prefix: Initial diagnosis Total positive nodes: 0 Histologic grading system: 4 grade system Histologic grade (G): G3 Laterality: Right Lymph-vascular invasion (LVI): LVI not present (absent)/not identified Tumor deposits (TD): Present Perineural invasion (PNI): Absent Microsatellite instability (MSI): Unstable high BRAF mutation: Positive   10/08/2021 Initial Diagnosis    Primary adenocarcinoma of ascending colon ([place])   10/22/2021 - 11/07/2021 Chemotherapy   Patient is on Treatment Plan : COLORECTAL FOLFOX q14d x 3 months     10/22/2021 -  Chemotherapy   Patient is on Treatment Plan : COLORECTAL FOLFOX q14d x 3 months        INTERVAL HISTORY:  Joseph Pennington is here for a follow up of colon cancer. He was last seen by me on 12/03/21. He presents to the clinic accompanied by his son(?). He reports he is doing well overall, continuing to tolerate treatment well overall.   All other systems were reviewed with the patient and are negative.  MEDICAL HISTORY:  Past Medical History:  Diagnosis Date   Acute cholecystitis 02/19/2018   Anemia    Chronic kidney disease    Colon cancer ([place])    Diabetes mellitus without complication (HCC)    diet controlled   GERD (gastroesophageal reflux disease)    History of blood transfusion 08/2021   History of hiatal hernia    Hypertension    Hypothyroidism    Iron deficiency anemia    Peripheral neuropathy    bilateral feet    SURGICAL HISTORY: Past Surgical History:  Procedure Laterality Date   CHOLECYSTECTOMY N/A 02/20/2018   Procedure: LAPAROSCOPIC CHOLECYSTECTOMY;  Surgeon: Excell Seltzer, MD;  Location: [facility];  Service: General;  Laterality: N/A;   COLONOSCOPY  2023   LAPAROSCOPIC RIGHT HEMI COLECTOMY Right 09/14/2021   Procedure: LAPAROSCOPIC TO OPEN RIGHT HEMI COLECTOMY;  Surgeon: Ileana Roup, MD;  Location: [facility];  Service: General;  Laterality: Right;   PORTACATH PLACEMENT N/A 10/15/2021   Procedure: INSERTION  PORT-A-CATH;  Surgeon: Ileana Roup, MD;  Location: [facility];  Service: General;  Laterality: N/A;  with ultrasound and flouroscopic guidance    I have reviewed the social history and family history with the patient and they are unchanged from previous note.  ALLERGIES:  has No Known Allergies.  MEDICATIONS:  Current Outpatient Medications  Medication Sig Dispense Refill    acetaminophen (TYLENOL) 500 MG tablet Take 500-1,000 mg by mouth every 6 (six) hours as needed for moderate pain.     ARTIFICIAL TEAR SOLUTION OP Place 1 drop into both eyes daily as needed (dry eyes).     cyanocobalamin (,VITAMIN B-12,) 1000 MCG/ML injection Inject 1,000 mcg into the muscle every 30 (thirty) days.   0   Iron, Ferrous Sulfate, 325 (65 Fe) MG TABS Take 325 mg by mouth daily.     levothyroxine (SYNTHROID) 75 MCG tablet Take 75 mcg by mouth daily before breakfast.     lisinopril (PRINIVIL,ZESTRIL) 20 MG tablet Take 20 mg by mouth daily.  3   polyethylene glycol powder (GLYCOLAX/MIRALAX) 17 GM/SCOOP powder Take 17 g by mouth as needed for mild constipation. (Patient taking differently: Take 17 g by mouth daily as needed for mild constipation.)     traMADol (ULTRAM) 50 MG tablet Take 1-2 tablets (50-100 mg total) by mouth every 6 (six) hours as needed (tylenol first). (Patient not taking: Reported on 10/28/2021) 10 tablet 0   vitamin C (ASCORBIC ACID) 500 MG tablet Take 500 mg by mouth daily.     No current facility-administered medications for this visit.   Facility-Administered Medications Ordered in Other Visits  Medication Dose Route Frequency Provider Last Rate Last Admin   dexamethasone (DECADRON) 10 mg in sodium chloride 0.9 % 50 mL IVPB  10 mg Intravenous Once Truitt Merle, MD 204 mL/hr at 12/24/21 0952 10 mg at 12/24/21 0952   fluorouracil (ADRUCIL) 4,200 mg in sodium chloride 0.9 % 66 mL chemo infusion  2,000 mg/m2 (Order-Specific) Intravenous 1 day or 1 dose Truitt Merle, MD       fluorouracil (ADRUCIL) chemo injection 850 mg  400 mg/m2 (Order-Specific) Intravenous Once Truitt Merle, MD       leucovorin 840 mg in dextrose 5 % 250 mL infusion  400 mg/m2 (Order-Specific) Intravenous Once Truitt Merle, MD       oxaliplatin (ELOXATIN) 145 mg in dextrose 5 % 500 mL chemo infusion  70 mg/m2 (Order-Specific) Intravenous Once Truitt Merle, MD        PHYSICAL EXAMINATION: ECOG PERFORMANCE  STATUS: 1 - Symptomatic but completely ambulatory  Vitals:   12/24/21 0858  BP: (!) 149/82  Pulse: 74  Resp: 18  Temp: 97.6 F (36.4 C)  SpO2: 100%   Wt Readings from Last 3 Encounters:  12/24/21 208 lb 9.6 oz (94.6 kg)  12/03/21 207 lb 6.4 oz (94.1 kg)  11/19/21 203 lb 3.2 oz (92.2 kg)     GENERAL:alert, no distress and comfortable SKIN: skin color normal, no rashes or significant lesions EYES: normal, Conjunctiva are pink and non-injected, sclera clear  NEURO: alert & oriented x 3 with fluent speech  LABORATORY DATA:  I have reviewed the data as listed    Latest Ref Rng & Units 12/24/2021    8:28 AM 12/03/2021   10:14 AM 11/27/2021    7:53 AM  CBC  WBC 4.0 - 10.5 K/uL 2.9  4.2  3.7   Hemoglobin 13.0 - 17.0 g/dL 11.2  10.1  9.8   Hematocrit 39.0 -  52.0 % 32.6  30.6  28.8   Platelets 150 - 400 K/uL 134  78  90         Latest Ref Rng & Units 12/24/2021    8:28 AM 12/03/2021   10:14 AM 11/27/2021    7:53 AM  CMP  Glucose 70 - 99 mg/dL 232  133  188   BUN 8 - 23 mg/dL $Remove'14  21  22   'RIpyDmh$ Creatinine 0.61 - 1.24 mg/dL 0.99  1.10  1.07   Sodium 135 - 145 mmol/L 135  137  136   Potassium 3.5 - 5.1 mmol/L 4.3  4.1  4.1   Chloride 98 - 111 mmol/L 103  106  105   CO2 22 - 32 mmol/L $RemoveB'28  27  25   'XrjLweIN$ Calcium 8.9 - 10.3 mg/dL 9.3  9.6  9.4   Total Protein 6.5 - 8.1 g/dL 7.0  6.4  6.5   Total Bilirubin 0.3 - 1.2 mg/dL 0.8  0.5  0.5   Alkaline Phos 38 - 126 U/L 91  76  74   AST 15 - 41 U/L $Remo'21  20  15   'hzHgX$ ALT 0 - 44 U/L $Remo'14  15  10       'OONbP$ RADIOGRAPHIC STUDIES: I have personally reviewed the radiological images as listed and agreed with the findings in the report. No results found.    No orders of the defined types were placed in this encounter.  All questions were answered. The patient knows to call the clinic with any problems, questions or concerns. No barriers to learning was detected. The total time spent in the appointment was 30 minutes.     Truitt Merle, MD 12/24/2021   I, Wilburn Mylar, am acting as scribe for Truitt Merle, MD.   I have reviewed the above documentation for accuracy and completeness, and I agree with the above.

## 2021-12-26 ENCOUNTER — Inpatient Hospital Stay: Payer: Medicare Other

## 2021-12-26 VITALS — BP 133/72 | HR 80 | Temp 98.2°F | Resp 16

## 2021-12-26 DIAGNOSIS — Z5111 Encounter for antineoplastic chemotherapy: Secondary | ICD-10-CM | POA: Diagnosis not present

## 2021-12-26 DIAGNOSIS — D500 Iron deficiency anemia secondary to blood loss (chronic): Secondary | ICD-10-CM | POA: Diagnosis not present

## 2021-12-26 DIAGNOSIS — C182 Malignant neoplasm of ascending colon: Secondary | ICD-10-CM

## 2021-12-26 DIAGNOSIS — D6959 Other secondary thrombocytopenia: Secondary | ICD-10-CM | POA: Diagnosis not present

## 2021-12-26 DIAGNOSIS — E538 Deficiency of other specified B group vitamins: Secondary | ICD-10-CM | POA: Diagnosis not present

## 2021-12-26 DIAGNOSIS — T451X5A Adverse effect of antineoplastic and immunosuppressive drugs, initial encounter: Secondary | ICD-10-CM | POA: Diagnosis not present

## 2021-12-26 MED ORDER — HEPARIN SOD (PORK) LOCK FLUSH 100 UNIT/ML IV SOLN
500.0000 [IU] | Freq: Once | INTRAVENOUS | Status: AC | PRN
  Administered 2021-12-26: 500 [IU]

## 2021-12-26 MED ORDER — SODIUM CHLORIDE 0.9% FLUSH
10.0000 mL | INTRAVENOUS | Status: DC | PRN
  Administered 2021-12-26: 10 mL

## 2022-01-05 DIAGNOSIS — C182 Malignant neoplasm of ascending colon: Secondary | ICD-10-CM | POA: Diagnosis not present

## 2022-01-06 MED FILL — Dexamethasone Sodium Phosphate Inj 100 MG/10ML: INTRAMUSCULAR | Qty: 1 | Status: AC

## 2022-01-07 ENCOUNTER — Inpatient Hospital Stay: Payer: Medicare Other

## 2022-01-07 ENCOUNTER — Other Ambulatory Visit: Payer: Self-pay

## 2022-01-07 ENCOUNTER — Inpatient Hospital Stay (HOSPITAL_BASED_OUTPATIENT_CLINIC_OR_DEPARTMENT_OTHER): Payer: Medicare Other | Admitting: Adult Health

## 2022-01-07 ENCOUNTER — Encounter: Payer: Self-pay | Admitting: Adult Health

## 2022-01-07 VITALS — BP 151/79 | HR 79 | Temp 97.2°F | Resp 18 | Ht 73.0 in | Wt 208.4 lb

## 2022-01-07 DIAGNOSIS — E538 Deficiency of other specified B group vitamins: Secondary | ICD-10-CM | POA: Diagnosis not present

## 2022-01-07 DIAGNOSIS — C182 Malignant neoplasm of ascending colon: Secondary | ICD-10-CM

## 2022-01-07 DIAGNOSIS — D500 Iron deficiency anemia secondary to blood loss (chronic): Secondary | ICD-10-CM | POA: Diagnosis not present

## 2022-01-07 DIAGNOSIS — T451X5A Adverse effect of antineoplastic and immunosuppressive drugs, initial encounter: Secondary | ICD-10-CM | POA: Diagnosis not present

## 2022-01-07 DIAGNOSIS — Z95828 Presence of other vascular implants and grafts: Secondary | ICD-10-CM

## 2022-01-07 DIAGNOSIS — Z5111 Encounter for antineoplastic chemotherapy: Secondary | ICD-10-CM | POA: Diagnosis not present

## 2022-01-07 DIAGNOSIS — D6959 Other secondary thrombocytopenia: Secondary | ICD-10-CM | POA: Diagnosis not present

## 2022-01-07 LAB — CBC WITH DIFFERENTIAL (CANCER CENTER ONLY)
Abs Immature Granulocytes: 0 10*3/uL (ref 0.00–0.07)
Basophils Absolute: 0 10*3/uL (ref 0.0–0.1)
Basophils Relative: 1 %
Eosinophils Absolute: 0.1 10*3/uL (ref 0.0–0.5)
Eosinophils Relative: 2 %
HCT: 30.8 % — ABNORMAL LOW (ref 39.0–52.0)
Hemoglobin: 11.2 g/dL — ABNORMAL LOW (ref 13.0–17.0)
Immature Granulocytes: 0 %
Lymphocytes Relative: 18 %
Lymphs Abs: 0.6 10*3/uL — ABNORMAL LOW (ref 0.7–4.0)
MCH: 33.8 pg (ref 26.0–34.0)
MCHC: 36.4 g/dL — ABNORMAL HIGH (ref 30.0–36.0)
MCV: 93.1 fL (ref 80.0–100.0)
Monocytes Absolute: 0.4 10*3/uL (ref 0.1–1.0)
Monocytes Relative: 11 %
Neutro Abs: 2.5 10*3/uL (ref 1.7–7.7)
Neutrophils Relative %: 68 %
Platelet Count: 65 10*3/uL — ABNORMAL LOW (ref 150–400)
RBC: 3.31 MIL/uL — ABNORMAL LOW (ref 4.22–5.81)
RDW: 17.8 % — ABNORMAL HIGH (ref 11.5–15.5)
WBC Count: 3.6 10*3/uL — ABNORMAL LOW (ref 4.0–10.5)
nRBC: 0 % (ref 0.0–0.2)

## 2022-01-07 LAB — CMP (CANCER CENTER ONLY)
ALT: 15 U/L (ref 0–44)
AST: 22 U/L (ref 15–41)
Albumin: 3.6 g/dL (ref 3.5–5.0)
Alkaline Phosphatase: 105 U/L (ref 38–126)
Anion gap: 8 (ref 5–15)
BUN: 11 mg/dL (ref 8–23)
CO2: 24 mmol/L (ref 22–32)
Calcium: 9.3 mg/dL (ref 8.9–10.3)
Chloride: 106 mmol/L (ref 98–111)
Creatinine: 0.96 mg/dL (ref 0.61–1.24)
GFR, Estimated: >60 mL/min (ref >60)
Glucose, Bld: 198 mg/dL — ABNORMAL HIGH (ref 70–99)
Potassium: 3.4 mmol/L — ABNORMAL LOW (ref 3.5–5.1)
Sodium: 138 mmol/L (ref 135–145)
Total Bilirubin: 1.1 mg/dL (ref 0.3–1.2)
Total Protein: 6.4 g/dL — ABNORMAL LOW (ref 6.5–8.1)

## 2022-01-07 MED ORDER — SODIUM CHLORIDE 0.9% FLUSH
10.0000 mL | Freq: Once | INTRAVENOUS | Status: AC
  Administered 2022-01-07: 10 mL via INTRAVENOUS

## 2022-01-07 MED ORDER — HEPARIN SOD (PORK) LOCK FLUSH 100 UNIT/ML IV SOLN
500.0000 [IU] | Freq: Once | INTRAVENOUS | Status: AC
  Administered 2022-01-07: 500 [IU] via INTRAVENOUS

## 2022-01-07 NOTE — Assessment & Plan Note (Signed)
Joseph Pennington is an 88 year old man with adenocarcinoma of the colon stage IIIb diagnosed in June 2023 here today for follow-up and evaluation prior to receiving FOLFOX chemotherapy.  Berneta Sages is doing moderately well today.  He did experience increased side effects from his recent treatment.  His platelet count today is 65 therefore I do not think that it is in his best interest to receive treatment today.  We will hold his chemotherapy and get him scheduled for next week.  In the next week he is going to work on his strength at home.  I reviewed his labs with him in detail and he verbalizes understanding and is in agreement with this approach.    Darrell will call if he experiences any worsening in diarrhea however This is improving.

## 2022-01-07 NOTE — Progress Notes (Signed)
[place] [facility] Follow up:    Joseph Frees, MD [place] [address]   DIAGNOSIS:  Cancer Staging  Primary adenocarcinoma of ascending colon [facility]) Staging form: Colon and Rectum, AJCC 8th Edition - Pathologic stage from 09/14/2021: Stage IIIB (pT3, pN1c, cM0) - Signed by Alla Feeling, NP on 10/08/2021 Stage prefix: Initial diagnosis Total positive nodes: 0 Histologic grading system: 4 grade system Histologic grade (G): G3 Laterality: Right Lymph-vascular invasion (LVI): LVI not present (absent)/not identified Tumor deposits (TD): Present Perineural invasion (PNI): Absent Microsatellite instability (MSI): Unstable high BRAF mutation: Positive   SUMMARY OF ONCOLOGIC HISTORY: Oncology History  Ascending colon cancer s/p lap colectomy 09/14/2021  07/23/2021 Procedure   Upper GI Endoscopy/Colonoscopy; Dr. Lorenso Courier  Colonoscopy Impression: - Likely malignant partially obstructing tumor in the ascending colon. Biopsied. - Six 3 to 8 mm polyps in the rectum and in the transverse colon, removed with a cold snare. Resected and retrieved. - Non-bleeding internal hemorrhoids.  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 Biopsied. - Multiple gastric polyps. Resected and retrieved. - Erythematous mucosa in the antrum. Biopsied. - Mucosal nodule found in the duodenum. Biopsied. - Dilated lacteals were found in the duodenum. Biopsied. - Two non-bleeding angioectasias in the duodenum.   07/23/2021 Initial Biopsy   Diagnosis 1. Duodenum, Biopsy - BENIGN DUODENAL MUCOSA - NO ACUTE INFLAMMATION, VILLOUS BLUNTING OR INCREASED INTRAEPITHELIAL LYMPHOCYTES IDENTIFIED 2. Duodenum, Biopsy, Nodule - PEPTIC DUODENITIS - NO DYSPLASIA OR MALIGNANCY IDENTIFIED 3. Stomach, biopsy - MILD CHRONIC GASTRITIS WITHOUT ACTIVITY - NO INTESTINAL METAPLASIA IDENTIFIED - SEE COMMENT 4. Stomach,  polyp(s) - HYPERPLASTIC GASTRIC POLYP(S) WITH INTESTINAL METAPLASIA - NO H. PYLORI OR MALIGNANCY IDENTIFIED 5. Esophagogastric junction, biopsy - SQUAMOCOLUMNAR JUNCTION WITH CHRONIC INFLAMMATION - NO INTESTINAL METAPLASIA, DYSPLASIA OR MALIGNANCY IDENTIFIED 6. Rectum, biopsy, and transverse - MULTIPLE FRAGMENTS OF HYPERPLASTIC POLYP(S) - NO HIGH-GRADE DYSPLASIA OR MALIGNANCY IDENTIFIED 7. Ascending Colon Biopsy, Mass - POORLY DIFFERENTIATED ADENOCARCINOMA - SEE COMMENT  Microscopic Comment 7. By immunohistochemistry, the neoplastic cells are positive for CDX2 (patchy, weak) but negative for p63, p40, CD56, chromogranin and synaptophysin. This immunoprofile is consistent with a poorly differentiated adenocarcinoma.  3. H. pylori immunohistochemistry is POSITIVE for RARE microorganisms.   07/30/2021 Imaging   EXAM: CT CHEST, ABDOMEN, AND PELVIS WITH CONTRAST  IMPRESSION: 1. Masslike region in the ascending colon extending to the level of the hepatic flexure in the right lower quadrant, concerning for malignancy. Associated pericolonic fat stranding and a few prominent lymph nodes are seen adjacent to this segment of colon, suspicious for local infiltration. No distant metastasis is seen. 2. Left inguinal hernia containing nonobstructed colon. 3. Nonspecific small ground-glass opacity in the in the right lower lobe. Attention on follow-up is recommended. 4. Small hiatal hernia. 5. Aortic atherosclerosis and coronary artery calcifications   08/06/2021 Initial Diagnosis   Malignant neoplasm of ascending colon [facility])   Primary adenocarcinoma of ascending colon ([place])  07/23/2021 Procedure   Colonoscopy by Dr. Lorenso Courier - A frond-like/villous, fungating and ulcerated partially obstructing large mass was found in the ascending colon. There was a point at which the mass could not be bypassed, even with use of an ultraslim colonoscope. It is suspected that this mass encompasses the entire  cecum and ascending colon. The mass was circumferential. The mass measured fifteen cm in length. Oozing was present.    07/23/2021 Initial Biopsy   3. H. pylori immunohistochemistry is POSITIVE for  RARE microorganisms.  FINAL DIAGNOSIS Diagnosis 1. Duodenum, Biopsy - BENIGN DUODENAL MUCOSA - NO ACUTE INFLAMMATION, VILLOUS BLUNTING OR INCREASED INTRAEPITHELIAL LYMPHOCYTES IDENTIFIED 2. Duodenum, Biopsy, Nodule - PEPTIC DUODENITIS - NO DYSPLASIA OR MALIGNANCY IDENTIFIED 3. Stomach, biopsy - MILD CHRONIC GASTRITIS WITHOUT ACTIVITY - NO INTESTINAL METAPLASIA IDENTIFIED - SEE COMMENT 4. Stomach, polyp(s) - HYPERPLASTIC GASTRIC POLYP(S) WITH INTESTINAL METAPLASIA - NO H. PYLORI OR MALIGNANCY IDENTIFIED 5. Esophagogastric junction, biopsy - SQUAMOCOLUMNAR JUNCTION WITH CHRONIC INFLAMMATION - NO INTESTINAL METAPLASIA, DYSPLASIA OR MALIGNANCY IDENTIFIED 6. Rectum, biopsy, and transverse - MULTIPLE FRAGMENTS OF HYPERPLASTIC POLYP(S) - NO HIGH-GRADE DYSPLASIA OR MALIGNANCY IDENTIFIED 7. Ascending Colon Biopsy, Mass - POORLY DIFFERENTIATED ADENOCARCINOMA - SEE COMMENT   07/30/2021 Imaging   CT CAP IMPRESSION: 1. Masslike region in the ascending colon extending to the level of the hepatic flexure in the right lower quadrant, concerning for malignancy. Associated pericolonic fat stranding and a few prominent lymph nodes are seen adjacent to this segment of colon, suspicious for local infiltration. No distant metastasis is seen. 2. Left inguinal hernia containing nonobstructed colon. 3. Nonspecific small ground-glass opacity in the in the right lower lobe. Attention on follow-up is recommended. 4. Small hiatal hernia. 5. Aortic atherosclerosis and coronary artery calcifications   09/14/2021 Surgery   PROCEDURE:  Laparoscopic converted to open right hemicolectomy Repair of small bowel serosa x 2     09/14/2021 Pathology Results   A. COLON, RIGHT, HEMI COLECTOMY:  Poorly differentiated  adenocarcinoma in the ascending colon with clear margins of resection.  Tumor Size: Circumferential measuring 9.0 cm X 8.0 cm. X 2.5 cm.  Macroscopic Tumor Perforation: Not identified  Histologic Type: Adenocarcinoma.  Histologic Grade: Grade 3, poorly differentiated.  Multiple Primary Sites: Not applicable  Tumor Extension: Extends through the muscular wall into the pericolonic adipose tissue.  Lymphovascular Invasion: Not identified.  Perineural Invasion: Not identified.  Treatment Effect: No known presurgical therapy.  Margins:       Margin Status for Invasive Carcinoma: All margins are negative for invasive carcinoma       Distance from Invasive Carcinoma to proximal margin: 11 cm.       Distance from invasive carcinoma to distal margin: 12 cm.  Regional Lymph Nodes:       Number of Lymph Nodes with Tumor: None.       Number of Lymph Nodes Examined: Twenty-two (22)  Tumor Deposits: Present (slide A11)  Distant Metastasis:       Distant Site(s) Involved: None known.  Pathologic Stage Classification (pTNM, AJCC 8th Edition): pT3 pN1c  Comments: Omentum is free of neoplastic invasion.                      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[facility])    09/14/2021 Cancer Staging   Staging form: Colon and Rectum, AJCC 8th Edition - Pathologic stage from 09/14/2021: Stage IIIB (pT3, pN1c, cM0) - Signed by Alla Feeling, NP on 10/08/2021 Stage prefix: Initial diagnosis Total positive nodes: 0 Histologic grading system: 4 grade system Histologic grade (G): G3 Laterality: Right Lymph-vascular invasion (LVI): LVI not present (absent)/not identified Tumor deposits (TD): Present Perineural invasion (PNI): Absent Microsatellite instability (MSI): Unstable high BRAF mutation: Positive   10/08/2021 Initial Diagnosis   Primary adenocarcinoma of ascending colon ([place])   10/22/2021 - 11/07/2021 Chemotherapy   Patient is on Treatment Plan : COLORECTAL FOLFOX q14d x 3 months     10/22/2021 -   Chemotherapy   Patient is on Treatment Plan :  COLORECTAL FOLFOX q14d x 3 months       CURRENT THERAPY: FOLFOX  INTERVAL HISTORY: Joseph Pennington 88 y.o. male returns for follow-up and evaluation prior to receiving his next chemotherapy treatment.  He notes that this most recent treatment he received 2 weeks ago took the wind out of his cells more than previous cycles.  He notes that he had increased fatigue and with this cycle he experienced a slight increase in diarrhea than he had previously.  The diarrhea however resolved with Imodium.   Patient Active Problem List   Diagnosis Date Noted   Primary adenocarcinoma of ascending colon ([place]) 10/08/2021   Chronic kidney disease, stage 3 unspecified ([place]) 09/19/2021   Benign paroxysmal positional vertigo 09/19/2021   Hypertension 09/19/2021   Hypertensive retinopathy 09/19/2021   Hypothyroidism 09/19/2021   Iron deficiency anemia due to chronic blood loss 09/19/2021   Type 2 diabetes mellitus without complication ([place]) 65/79/0383   Vitamin B deficiency 09/19/2021   S/P right hemicolectomy 09/14/2021   Ascending colon cancer s/p lap colectomy 09/14/2021 08/06/2021    has No Known Allergies.  MEDICAL HISTORY: Past Medical History:  Diagnosis Date   Acute cholecystitis 02/19/2018   Anemia    Chronic kidney disease    Colon cancer ([place])    Diabetes mellitus without complication (H[facility])    diet controlled   GERD (gastroesophageal reflux disease)    History of blood transfusion 08/2021   History of hiatal hernia    Hypertension    Hypothyroidism    Iron deficiency anemia    Peripheral neuropathy    bilateral feet    SURGICAL HISTORY: Past Surgical History:  Procedure Laterality Date   CHOLECYSTECTOMY N/A 02/20/2018   Procedure: LAPAROSCOPIC CHOLECYSTECTOMY;  Surgeon: Excell Seltzer, MD;  Location: [facility];  Service: General;  Laterality: N/A;   COLONOSCOPY  2023   LAPAROSCOPIC RIGHT HEMI COLECTOMY Right 09/14/2021    Procedure: LAPAROSCOPIC TO OPEN RIGHT HEMI COLECTOMY;  Surgeon: Ileana Roup, MD;  Location: [facility];  Service: General;  Laterality: Right;   PORTACATH PLACEMENT N/A 10/15/2021   Procedure: INSERTION PORT-A-CATH;  Surgeon: Ileana Roup, MD;  Location: [facility];  Service: General;  Laterality: N/A;  with ultrasound and flouroscopic guidance    SOCIAL HISTORY: Social History   Socioeconomic History   Marital status: Married    Spouse name: Not on file   Number of children: Not on file   Years of education: Not on file   Highest education level: Not on file  Occupational History   Not on file  Tobacco Use   Smoking status: Former    Packs/day: 1.00    Years: 20.00    Total pack years: 20.00    Types: Cigarettes    Quit date: 1980    Years since quitting: 43.8   Smokeless tobacco: Never   Tobacco comments:    Smoked cigarettes x20 years, </= 1 PPD. Quit 45 years ago  Vaping Use   Vaping Use: Never used  Substance and Sexual Activity   Alcohol use: Not Currently   Drug use: Not Currently   Sexual activity: Not on file  Other Topics Concern   Not on file  Social History Narrative   Not on file   Social Determinants of Health   Financial Resource Strain: Not on file  Food Insecurity: Not on file  Transportation Needs: Not on file  Physical Activity: Not on file  Stress: Not on file  Social Connections: Not on  file  Intimate Partner Violence: Not on file    FAMILY HISTORY: Family History  Problem Relation Age of Onset   Rectal cancer Mother    Heart attack Father     Review of Systems  Constitutional:  Positive for fatigue. Negative for appetite change, chills, fever and unexpected weight change.  HENT:   Negative for hearing loss, lump/mass and trouble swallowing.   Eyes:  Negative for eye problems and icterus.  Respiratory:  Negative for chest tightness, cough and shortness of breath.   Cardiovascular:  Negative for chest pain, leg swelling and  palpitations.  Gastrointestinal:  Positive for diarrhea. Negative for abdominal distention, abdominal pain, constipation, nausea and vomiting.  Endocrine: Negative for hot flashes.  Genitourinary:  Negative for difficulty urinating.   Musculoskeletal:  Negative for arthralgias.  Skin:  Negative for itching and rash.  Neurological:  Negative for dizziness, extremity weakness, headaches and numbness.  Hematological:  Negative for adenopathy. Does not bruise/bleed easily.  Psychiatric/Behavioral:  Negative for depression. The patient is not nervous/anxious.       PHYSICAL EXAMINATION  ECOG PERFORMANCE STATUS: 1 - Symptomatic but completely ambulatory  Vitals:   01/07/22 0943  BP: (!) 151/79  Pulse: 79  Resp: 18  Temp: (!) 97.2 F (36.2 C)  SpO2: 100%    Physical Exam Constitutional:      General: He is not in acute distress.    Appearance: Normal appearance. He is not toxic-appearing.  HENT:     Head: Normocephalic and atraumatic.  Eyes:     General: No scleral icterus. Cardiovascular:     Rate and Rhythm: Normal rate and regular rhythm.     Pulses: Normal pulses.     Heart sounds: Normal heart sounds.  Pulmonary:     Effort: Pulmonary effort is normal.     Breath sounds: Normal breath sounds.  Abdominal:     General: Abdomen is flat. Bowel sounds are normal. There is no distension.     Palpations: Abdomen is soft.     Tenderness: There is no abdominal tenderness.  Musculoskeletal:        General: No swelling.     Cervical back: Neck supple.  Lymphadenopathy:     Cervical: No cervical adenopathy.  Skin:    General: Skin is warm and dry.     Findings: No rash.  Neurological:     General: No focal deficit present.     Mental Status: He is alert.  Psychiatric:        Mood and Affect: Mood normal.        Behavior: Behavior normal.     LABORATORY DATA:  CBC    Component Value Date/Time   WBC 3.6 (L) 01/07/2022 0920   WBC 5.1 09/21/2021 0428   RBC 3.31 (L)  01/07/2022 0920   HGB 11.2 (L) 01/07/2022 0920   HCT 30.8 (L) 01/07/2022 0920   PLT 65 (L) 01/07/2022 0920   MCV 93.1 01/07/2022 0920   MCH 33.8 01/07/2022 0920   MCHC 36.4 (H) 01/07/2022 0920   RDW 17.8 (H) 01/07/2022 0920   LYMPHSABS 0.6 (L) 01/07/2022 0920   MONOABS 0.4 01/07/2022 0920   EOSABS 0.1 01/07/2022 0920   BASOSABS 0.0 01/07/2022 0920    CMP     Component Value Date/Time   NA 138 01/07/2022 0920   K 3.4 (L) 01/07/2022 0920   CL 106 01/07/2022 0920   CO2 24 01/07/2022 0920   GLUCOSE 198 (H) 01/07/2022 0920   BUN  11 01/07/2022 0920   CREATININE 0.96 01/07/2022 0920   CALCIUM 9.3 01/07/2022 0920   PROT 6.4 (L) 01/07/2022 0920   ALBUMIN 3.6 01/07/2022 0920   AST 22 01/07/2022 0920   ALT 15 01/07/2022 0920   ALKPHOS 105 01/07/2022 0920   BILITOT 1.1 01/07/2022 0920   GFRNONAA >60 01/07/2022 0920   GFRAA >60 02/22/2018 0551      ASSESSMENT and THERAPY PLAN:   Primary adenocarcinoma of ascending colon ([place]) Joseph Pennington is an 88 year old man with adenocarcinoma of the colon stage IIIb diagnosed in June 2023 here today for follow-up and evaluation prior to receiving FOLFOX chemotherapy.  Joseph Pennington is doing moderately well today.  He did experience increased side effects from his recent treatment.  His platelet count today is 65 therefore I do not think that it is in his best interest to receive treatment today.  We will hold his chemotherapy and get him scheduled for next week.  In the next week he is going to work on his strength at home.  I reviewed his labs with him in detail and he verbalizes understanding and is in agreement with this approach.    Joseph Pennington will call if he experiences any worsening in diarrhea however This is improving.    All questions were answered. The patient knows to call the clinic with any problems, questions or concerns. We can certainly see the patient much sooner if necessary.  Total encounter time:20 minutes*in face-to-face visit time,  chart review, lab review, care coordination, order entry, and documentation of the encounter time.    Joseph Bihari, NP 01/07/22 10:50 AM Medical Oncology and Hematology [facility] [place], [place] [address] Tel. [phone]    Fax. [phone]  *Total Encounter Time as defined by the Centers for Medicare and Medicaid Services includes, in addition to the face-to-face time of a patient visit (documented in the note above) non-face-to-face time: obtaining and reviewing outside history, ordering and reviewing medications, tests or procedures, care coordination (communications with other health care professionals or caregivers) and documentation in the medical record.

## 2022-01-13 ENCOUNTER — Other Ambulatory Visit: Payer: Self-pay | Admitting: Hematology

## 2022-01-13 ENCOUNTER — Inpatient Hospital Stay: Payer: Medicare Other

## 2022-01-13 ENCOUNTER — Encounter: Payer: Self-pay | Admitting: Nurse Practitioner

## 2022-01-13 ENCOUNTER — Telehealth: Payer: Self-pay

## 2022-01-13 ENCOUNTER — Other Ambulatory Visit (HOSPITAL_COMMUNITY): Payer: Self-pay

## 2022-01-13 ENCOUNTER — Telehealth: Payer: Self-pay | Admitting: Hematology

## 2022-01-13 ENCOUNTER — Telehealth: Payer: Self-pay | Admitting: Pharmacy Technician

## 2022-01-13 ENCOUNTER — Inpatient Hospital Stay (HOSPITAL_BASED_OUTPATIENT_CLINIC_OR_DEPARTMENT_OTHER): Payer: Medicare Other | Admitting: Physician Assistant

## 2022-01-13 ENCOUNTER — Other Ambulatory Visit: Payer: Self-pay

## 2022-01-13 ENCOUNTER — Encounter: Payer: Self-pay | Admitting: Hematology

## 2022-01-13 VITALS — BP 141/77 | HR 81 | Temp 97.5°F | Resp 18 | Ht 73.0 in | Wt 209.2 lb

## 2022-01-13 DIAGNOSIS — E538 Deficiency of other specified B group vitamins: Secondary | ICD-10-CM | POA: Diagnosis not present

## 2022-01-13 DIAGNOSIS — D500 Iron deficiency anemia secondary to blood loss (chronic): Secondary | ICD-10-CM

## 2022-01-13 DIAGNOSIS — Z5111 Encounter for antineoplastic chemotherapy: Secondary | ICD-10-CM | POA: Diagnosis not present

## 2022-01-13 DIAGNOSIS — C182 Malignant neoplasm of ascending colon: Secondary | ICD-10-CM

## 2022-01-13 DIAGNOSIS — T451X5A Adverse effect of antineoplastic and immunosuppressive drugs, initial encounter: Secondary | ICD-10-CM | POA: Diagnosis not present

## 2022-01-13 DIAGNOSIS — D6959 Other secondary thrombocytopenia: Secondary | ICD-10-CM | POA: Diagnosis not present

## 2022-01-13 LAB — CMP (CANCER CENTER ONLY)
ALT: 13 U/L (ref 0–44)
AST: 19 U/L (ref 15–41)
Albumin: 3.6 g/dL (ref 3.5–5.0)
Alkaline Phosphatase: 105 U/L (ref 38–126)
Anion gap: 6 (ref 5–15)
BUN: 15 mg/dL (ref 8–23)
CO2: 28 mmol/L (ref 22–32)
Calcium: 9.7 mg/dL (ref 8.9–10.3)
Chloride: 102 mmol/L (ref 98–111)
Creatinine: 1.04 mg/dL (ref 0.61–1.24)
GFR, Estimated: >60 mL/min (ref >60)
Glucose, Bld: 189 mg/dL — ABNORMAL HIGH (ref 70–99)
Potassium: 4.1 mmol/L (ref 3.5–5.1)
Sodium: 136 mmol/L (ref 135–145)
Total Bilirubin: 0.9 mg/dL (ref 0.3–1.2)
Total Protein: 6.6 g/dL (ref 6.5–8.1)

## 2022-01-13 LAB — CBC WITH DIFFERENTIAL (CANCER CENTER ONLY)
Abs Immature Granulocytes: 0 10*3/uL (ref 0.00–0.07)
Basophils Absolute: 0 10*3/uL (ref 0.0–0.1)
Basophils Relative: 1 %
Eosinophils Absolute: 0.1 10*3/uL (ref 0.0–0.5)
Eosinophils Relative: 2 %
HCT: 31 % — ABNORMAL LOW (ref 39.0–52.0)
Hemoglobin: 11.1 g/dL — ABNORMAL LOW (ref 13.0–17.0)
Immature Granulocytes: 0 %
Lymphocytes Relative: 21 %
Lymphs Abs: 0.7 10*3/uL (ref 0.7–4.0)
MCH: 34.3 pg — ABNORMAL HIGH (ref 26.0–34.0)
MCHC: 35.8 g/dL (ref 30.0–36.0)
MCV: 95.7 fL (ref 80.0–100.0)
Monocytes Absolute: 0.4 10*3/uL (ref 0.1–1.0)
Monocytes Relative: 14 %
Neutro Abs: 1.9 10*3/uL (ref 1.7–7.7)
Neutrophils Relative %: 62 %
Platelet Count: 100 10*3/uL — ABNORMAL LOW (ref 150–400)
RBC: 3.24 MIL/uL — ABNORMAL LOW (ref 4.22–5.81)
RDW: 16.8 % — ABNORMAL HIGH (ref 11.5–15.5)
WBC Count: 3.1 10*3/uL — ABNORMAL LOW (ref 4.0–10.5)
nRBC: 0 % (ref 0.0–0.2)

## 2022-01-13 MED ORDER — CAPECITABINE 500 MG PO TABS
ORAL_TABLET | ORAL | 1 refills | Status: DC
  Filled 2022-01-13: qty 90, fill #0

## 2022-01-13 MED ORDER — SODIUM CHLORIDE 0.9 % IV SOLN
10.0000 mg | Freq: Once | INTRAVENOUS | Status: AC
  Administered 2022-01-13: 10 mg via INTRAVENOUS
  Filled 2022-01-13: qty 10

## 2022-01-13 MED ORDER — PALONOSETRON HCL INJECTION 0.25 MG/5ML
0.2500 mg | Freq: Once | INTRAVENOUS | Status: AC
  Administered 2022-01-13: 0.25 mg via INTRAVENOUS
  Filled 2022-01-13: qty 5

## 2022-01-13 MED ORDER — ONDANSETRON HCL 8 MG PO TABS: 8.0000 mg | ORAL_TABLET | Freq: Three times a day (TID) | ORAL | 0 refills | Status: DC | PRN

## 2022-01-13 MED ORDER — SODIUM CHLORIDE 0.9% FLUSH: 10.0000 mL | INTRAVENOUS | Status: DC | PRN

## 2022-01-13 MED ORDER — HEPARIN SOD (PORK) LOCK FLUSH 100 UNIT/ML IV SOLN: 500.0000 [IU] | Freq: Once | INTRAVENOUS | Status: DC | PRN

## 2022-01-13 MED ORDER — SODIUM CHLORIDE 0.9% FLUSH
10.0000 mL | Freq: Once | INTRAVENOUS | Status: AC
  Administered 2022-01-13: 10 mL

## 2022-01-13 MED ORDER — FLUOROURACIL CHEMO INJECTION 2.5 GM/50ML
400.0000 mg/m2 | Freq: Once | INTRAVENOUS | Status: AC
  Administered 2022-01-13: 850 mg via INTRAVENOUS
  Filled 2022-01-13: qty 17

## 2022-01-13 MED ORDER — OXALIPLATIN CHEMO INJECTION 100 MG/20ML
70.0000 mg/m2 | Freq: Once | INTRAVENOUS | Status: AC
  Administered 2022-01-13: 145 mg via INTRAVENOUS
  Filled 2022-01-13: qty 20

## 2022-01-13 MED ORDER — SODIUM CHLORIDE 0.9 % IV SOLN
2000.0000 mg/m2 | INTRAVENOUS | Status: DC
  Administered 2022-01-13: 4200 mg via INTRAVENOUS
  Filled 2022-01-13: qty 84

## 2022-01-13 MED ORDER — LEUCOVORIN CALCIUM INJECTION 350 MG
400.0000 mg/m2 | Freq: Once | INTRAVENOUS | Status: AC
  Administered 2022-01-13: 840 mg via INTRAVENOUS
  Filled 2022-01-13: qty 42

## 2022-01-13 MED ORDER — DEXTROSE 5 % IV SOLN: Freq: Once | INTRAVENOUS | Status: AC

## 2022-01-13 NOTE — Telephone Encounter (Signed)
Oral Oncology Pharmacist Encounter  Received new prescription for capecitabine (Xeloda) for the treatment of colorectal cancer in conjunction with radiation, planned duration throughout radiation therapy or until disease progression or unacceptable toxicity.  Labs from 01/13/22 (CBC, CMP) assessed, no interventions needed. Prescription dose and frequency assessed.  Current medication list in Epic reviewed, no significant DDIs with Xeloda identified.  Evaluated chart and no patient barriers to medication adherence noted.   Patient agreement for treatment documented in MD note on 01/13/2022.  Prescription has been e-scribed to the [facility] for benefits analysis and approval.  Oral Oncology Clinic will continue to follow for insurance authorization, copayment issues, initial counseling and start date.  Drema Halon, PharmD Hematology/Oncology Clinical Pharmacist [facility] [phone] 01/13/2022 9:44 AM

## 2022-01-13 NOTE — Patient Instructions (Signed)
[facility]  Discharge Instructions: Thank you for choosing [facility] to provide your oncology and hematology care.   If you have a lab appointment with the [facility], please go directly to the [facility] and check in at the registration area.   Wear comfortable clothing and clothing appropriate for easy access to any Portacath or PICC line.   We strive to give you quality time with your provider. You may need to reschedule your appointment if you arrive late (15 or more minutes).  Arriving late affects you and other patients whose appointments are after yours.  Also, if you miss three or more appointments without notifying the office, you may be dismissed from the clinic at the provider's discretion.      For prescription refill requests, have your pharmacy contact our office and allow 72 hours for refills to be completed.    Today you received the following chemotherapy and/or immunotherapy agents: oxaliplatin, leucovorin and fluorouracil       To help prevent nausea and vomiting after your treatment, we encourage you to take your nausea medication as directed.  BELOW ARE SYMPTOMS THAT SHOULD BE REPORTED IMMEDIATELY: *FEVER GREATER THAN 100.4 F (38 C) OR HIGHER *CHILLS OR SWEATING *NAUSEA AND VOMITING THAT IS NOT CONTROLLED WITH YOUR NAUSEA MEDICATION *UNUSUAL SHORTNESS OF BREATH *UNUSUAL BRUISING OR BLEEDING *URINARY PROBLEMS (pain or burning when urinating, or frequent urination) *BOWEL PROBLEMS (unusual diarrhea, constipation, pain near the anus) TENDERNESS IN MOUTH AND THROAT WITH OR WITHOUT PRESENCE OF ULCERS (sore throat, sores in mouth, or a toothache) UNUSUAL RASH, SWELLING OR PAIN  UNUSUAL VAGINAL DISCHARGE OR ITCHING   Items with * indicate a potential emergency and should be followed up as soon as possible or go to the Emergency Department if any problems should occur.  Please show the CHEMOTHERAPY ALERT CARD or  IMMUNOTHERAPY ALERT CARD at check-in to the Emergency Department and triage nurse.  Should you have questions after your visit or need to cancel or reschedule your appointment, please contact [facility]  Dept: [phone]  and follow the prompts.  Office hours are 8:00 a.m. to 4:30 p.m. Monday - Friday. Please note that voicemails left after 4:00 p.m. may not be returned until the following business day.  We are closed weekends and major holidays. You have access to a nurse at all times for urgent questions. Please call the main number to the clinic Dept: [phone] and follow the prompts.   For any non-urgent questions, you may also contact your provider using MyChart. We now offer e-Visits for anyone 49 and older to request care online for non-urgent symptoms. For details visit [email or web address].   Also download the MyChart app! Go to the app store, search "MyChart", open the app, select , and log in with your MyChart username and password.  Masks are optional in the cancer centers. If you would like for your care team to wear a mask while they are taking care of you, please let them know. You may have one support person who is at least 86 years old accompany you for your appointments.

## 2022-01-13 NOTE — Telephone Encounter (Signed)
Oral Oncology Patient Advocate Encounter  After completing a benefits investigation, prior authorization for Capecitabine is not required at this time through Medicare B.  Patient's copay is $25.84.     Lady Deutscher, CPhT-Adv Oncology Pharmacy Patient [place] Direct Number: [phone]  Fax: [phone]

## 2022-01-13 NOTE — Telephone Encounter (Signed)
Per 10/25 los called and spoke to pt spouse about appointment

## 2022-01-13 NOTE — Progress Notes (Signed)
[place]   Telephone:([phone]) [phone] Fax:(336) [phone]   Clinic Follow up Note   Patient Care Team: Shirline Frees, MD as PCP - General (Family Medicine) Truitt Merle, MD as Consulting Physician (Hematology and Oncology)  Date of Service:  01/13/2022  CHIEF COMPLAINT: f/u of colon cancer  CURRENT THERAPY:  Adjuvant FOLFOX, q14d, started 10/22/21  SUMMARY OF ONCOLOGIC HISTORY: Oncology History  Ascending colon cancer s/p lap colectomy 09/14/2021  07/23/2021 Procedure   Upper GI Endoscopy/Colonoscopy; Dr. Lorenso Courier  Colonoscopy Impression: - Likely malignant partially obstructing tumor in the ascending colon. Biopsied. - Six 3 to 8 mm polyps in the rectum and in the transverse colon, removed with a cold snare. Resected and retrieved. - Non-bleeding internal hemorrhoids.  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 Biopsied. - Multiple gastric polyps. Resected and retrieved. - Erythematous mucosa in the antrum. Biopsied. - Mucosal nodule found in the duodenum. Biopsied. - Dilated lacteals were found in the duodenum. Biopsied. - Two non-bleeding angioectasias in the duodenum.   07/23/2021 Initial Biopsy   Diagnosis 1. Duodenum, Biopsy - BENIGN DUODENAL MUCOSA - NO ACUTE INFLAMMATION, VILLOUS BLUNTING OR INCREASED INTRAEPITHELIAL LYMPHOCYTES IDENTIFIED 2. Duodenum, Biopsy, Nodule - PEPTIC DUODENITIS - NO DYSPLASIA OR MALIGNANCY IDENTIFIED 3. Stomach, biopsy - MILD CHRONIC GASTRITIS WITHOUT ACTIVITY - NO INTESTINAL METAPLASIA IDENTIFIED - SEE COMMENT 4. Stomach, polyp(s) - HYPERPLASTIC GASTRIC POLYP(S) WITH INTESTINAL METAPLASIA - NO H. PYLORI OR MALIGNANCY IDENTIFIED 5. Esophagogastric junction, biopsy - SQUAMOCOLUMNAR JUNCTION WITH CHRONIC INFLAMMATION - NO INTESTINAL METAPLASIA, DYSPLASIA OR MALIGNANCY IDENTIFIED 6. Rectum, biopsy, and transverse - MULTIPLE FRAGMENTS OF HYPERPLASTIC POLYP(S) - NO  HIGH-GRADE DYSPLASIA OR MALIGNANCY IDENTIFIED 7. Ascending Colon Biopsy, Mass - POORLY DIFFERENTIATED ADENOCARCINOMA - SEE COMMENT  Microscopic Comment 7. By immunohistochemistry, the neoplastic cells are positive for CDX2 (patchy, weak) but negative for p63, p40, CD56, chromogranin and synaptophysin. This immunoprofile is consistent with a poorly differentiated adenocarcinoma.  3. H. pylori immunohistochemistry is POSITIVE for RARE microorganisms.   07/30/2021 Imaging   EXAM: CT CHEST, ABDOMEN, AND PELVIS WITH CONTRAST  IMPRESSION: 1. Masslike region in the ascending colon extending to the level of the hepatic flexure in the right lower quadrant, concerning for malignancy. Associated pericolonic fat stranding and a few prominent lymph nodes are seen adjacent to this segment of colon, suspicious for local infiltration. No distant metastasis is seen. 2. Left inguinal hernia containing nonobstructed colon. 3. Nonspecific small ground-glass opacity in the in the right lower lobe. Attention on follow-up is recommended. 4. Small hiatal hernia. 5. Aortic atherosclerosis and coronary artery calcifications   08/06/2021 Initial Diagnosis   Malignant neoplasm of ascending colon [facility])   Primary adenocarcinoma of ascending colon ([place])  07/23/2021 Procedure   Colonoscopy by Dr. Lorenso Courier - A frond-like/villous, fungating and ulcerated partially obstructing large mass was found in the ascending colon. There was a point at which the mass could not be bypassed, even with use of an ultraslim colonoscope. It is suspected that this mass encompasses the entire cecum and ascending colon. The mass was circumferential. The mass measured fifteen cm in length. Oozing was present.    07/23/2021 Initial Biopsy   3. H. pylori immunohistochemistry is POSITIVE for RARE microorganisms.  FINAL DIAGNOSIS Diagnosis 1. Duodenum, Biopsy - BENIGN DUODENAL MUCOSA - NO ACUTE INFLAMMATION, VILLOUS BLUNTING OR INCREASED  INTRAEPITHELIAL LYMPHOCYTES IDENTIFIED 2. Duodenum, Biopsy, Nodule - PEPTIC DUODENITIS - NO DYSPLASIA OR MALIGNANCY IDENTIFIED 3. Stomach, biopsy - MILD CHRONIC GASTRITIS WITHOUT ACTIVITY -  NO INTESTINAL METAPLASIA IDENTIFIED - SEE COMMENT 4. Stomach, polyp(s) - HYPERPLASTIC GASTRIC POLYP(S) WITH INTESTINAL METAPLASIA - NO H. PYLORI OR MALIGNANCY IDENTIFIED 5. Esophagogastric junction, biopsy - SQUAMOCOLUMNAR JUNCTION WITH CHRONIC INFLAMMATION - NO INTESTINAL METAPLASIA, DYSPLASIA OR MALIGNANCY IDENTIFIED 6. Rectum, biopsy, and transverse - MULTIPLE FRAGMENTS OF HYPERPLASTIC POLYP(S) - NO HIGH-GRADE DYSPLASIA OR MALIGNANCY IDENTIFIED 7. Ascending Colon Biopsy, Mass - POORLY DIFFERENTIATED ADENOCARCINOMA - SEE COMMENT   07/30/2021 Imaging   CT CAP IMPRESSION: 1. Masslike region in the ascending colon extending to the level of the hepatic flexure in the right lower quadrant, concerning for malignancy. Associated pericolonic fat stranding and a few prominent lymph nodes are seen adjacent to this segment of colon, suspicious for local infiltration. No distant metastasis is seen. 2. Left inguinal hernia containing nonobstructed colon. 3. Nonspecific small ground-glass opacity in the in the right lower lobe. Attention on follow-up is recommended. 4. Small hiatal hernia. 5. Aortic atherosclerosis and coronary artery calcifications   09/14/2021 Surgery   PROCEDURE:  Laparoscopic converted to open right hemicolectomy Repair of small bowel serosa x 2     09/14/2021 Pathology Results   A. COLON, RIGHT, HEMI COLECTOMY:  Poorly differentiated adenocarcinoma in the ascending colon with clear margins of resection.  Tumor Size: Circumferential measuring 9.0 cm X 8.0 cm. X 2.5 cm.  Macroscopic Tumor Perforation: Not identified  Histologic Type: Adenocarcinoma.  Histologic Grade: Grade 3, poorly differentiated.  Multiple Primary Sites: Not applicable  Tumor Extension: Extends through the  muscular wall into the pericolonic adipose tissue.  Lymphovascular Invasion: Not identified.  Perineural Invasion: Not identified.  Treatment Effect: No known presurgical therapy.  Margins:       Margin Status for Invasive Carcinoma: All margins are negative for invasive carcinoma       Distance from Invasive Carcinoma to proximal margin: 11 cm.       Distance from invasive carcinoma to distal margin: 12 cm.  Regional Lymph Nodes:       Number of Lymph Nodes with Tumor: None.       Number of Lymph Nodes Examined: Twenty-two (22)  Tumor Deposits: Present (slide A11)  Distant Metastasis:       Distant Site(s) Involved: None known.  Pathologic Stage Classification (pTNM, AJCC 8th Edition): pT3 pN1c  Comments: Omentum is free of neoplastic invasion.                      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LAMN)    09/14/2021 Cancer Staging   Staging form: Colon and Rectum, AJCC 8th Edition - Pathologic stage from 09/14/2021: Stage IIIB (pT3, pN1c, cM0) - Signed by Alla Feeling, NP on 10/08/2021 Stage prefix: Initial diagnosis Total positive nodes: 0 Histologic grading system: 4 grade system Histologic grade (G): G3 Laterality: Right Lymph-vascular invasion (LVI): LVI not present (absent)/not identified Tumor deposits (TD): Present Perineural invasion (PNI): Absent Microsatellite instability (MSI): Unstable high BRAF mutation: Positive   10/08/2021 Initial Diagnosis   Primary adenocarcinoma of ascending colon ([place])   10/22/2021 - 11/07/2021 Chemotherapy   Patient is on Treatment Plan : COLORECTAL FOLFOX q14d x 3 months     10/22/2021 -  Chemotherapy   Patient is on Treatment Plan : COLORECTAL FOLFOX q14d x 3 months        INTERVAL HISTORY:  Joseph Pennington is here for a follow up of colon cancer. He was last seen by Wilber Bihari NP on 01/07/2022. Treatment was deferred to today due to thrombocytopenia. He  presents to the clinic accompanied by his son.  Joseph Pennington reports  slight improvement of his energy levels with the extra week off from chemotherapy.  He continues to complete all his ADLs on his own.  He reports stable appetite which has been affected secondary to taste changes.  He denies any weight loss since last visit.Patient reports one episode of nausea that improved after taking antiemetics.  He denies any vomiting episodes.  He experienced diarrhea after his last cycle that resolved with Imodium.  He continues to take MiraLAX to help with constipation secondary to iron pills.  He denies easy bruising or signs of active bleeding.  He does experience cold sensitivity in his mouth.  He denies any residual neuropathy.  Patient denies fevers, chills, night sweats, shortness of breath, chest pain or cough.  He has no other complaints.   All other systems were reviewed with the patient and are negative.  MEDICAL HISTORY:  Past Medical History:  Diagnosis Date   Acute cholecystitis 02/19/2018   Anemia    Chronic kidney disease    Colon cancer ([place])    Diabetes mellitus without complication (HCC)    diet controlled   GERD (gastroesophageal reflux disease)    History of blood transfusion 08/2021   History of hiatal hernia    Hypertension    Hypothyroidism    Iron deficiency anemia    Peripheral neuropathy    bilateral feet    SURGICAL HISTORY: Past Surgical History:  Procedure Laterality Date   CHOLECYSTECTOMY N/A 02/20/2018   Procedure: LAPAROSCOPIC CHOLECYSTECTOMY;  Surgeon: Excell Seltzer, MD;  Location: [facility];  Service: General;  Laterality: N/A;   COLONOSCOPY  2023   LAPAROSCOPIC RIGHT HEMI COLECTOMY Right 09/14/2021   Procedure: LAPAROSCOPIC TO OPEN RIGHT HEMI COLECTOMY;  Surgeon: Ileana Roup, MD;  Location: [facility];  Service: General;  Laterality: Right;   PORTACATH PLACEMENT N/A 10/15/2021   Procedure: INSERTION PORT-A-CATH;  Surgeon: Ileana Roup, MD;  Location: [facility];  Service: General;  Laterality: N/A;  with ultrasound  and flouroscopic guidance    I have reviewed the social history and family history with the patient and they are unchanged from previous note.  ALLERGIES:  has No Known Allergies.  MEDICATIONS:  Current Outpatient Medications  Medication Sig Dispense Refill   acetaminophen (TYLENOL) 500 MG tablet Take 500-1,000 mg by mouth every 6 (six) hours as needed for moderate pain.     ARTIFICIAL TEAR SOLUTION OP Place 1 drop into both eyes daily as needed (dry eyes).     cyanocobalamin (,VITAMIN B-12,) 1000 MCG/ML injection Inject 1,000 mcg into the muscle every 30 (thirty) days.   0   Iron, Ferrous Sulfate, 325 (65 Fe) MG TABS Take 325 mg by mouth daily.     levothyroxine (SYNTHROID) 75 MCG tablet Take 75 mcg by mouth daily before breakfast.     lisinopril (PRINIVIL,ZESTRIL) 20 MG tablet Take 20 mg by mouth daily.  3   ondansetron (ZOFRAN) 8 MG tablet Take 1 tablet (8 mg total) by mouth every 8 (eight) hours as needed for nausea or vomiting. 90 tablet 0   vitamin C (ASCORBIC ACID) 500 MG tablet Take 500 mg by mouth daily.     capecitabine (XELODA) 500 MG tablet Take 3 tablets every 12 hours, after meals. Take on days of radiation, Monday through Friday. 90 tablet 1   polyethylene glycol powder (GLYCOLAX/MIRALAX) 17 GM/SCOOP powder Take 17 g by mouth as needed for mild constipation. (Patient not  taking: Reported on 01/07/2022)     No current facility-administered medications for this visit.   Facility-Administered Medications Ordered in Other Visits  Medication Dose Route Frequency Provider Last Rate Last Admin   dexamethasone (DECADRON) 10 mg in sodium chloride 0.9 % 50 mL IVPB  10 mg Intravenous Once Truitt Merle, MD 204 mL/hr at 01/13/22 0955 10 mg at 01/13/22 0955   fluorouracil (ADRUCIL) 4,200 mg in sodium chloride 0.9 % 66 mL chemo infusion  2,000 mg/m2 (Order-Specific) Intravenous 1 day or 1 dose Truitt Merle, MD       fluorouracil (ADRUCIL) chemo injection 850 mg  400 mg/m2 (Order-Specific)  Intravenous Once Truitt Merle, MD       heparin lock flush 100 unit/mL  500 Units Intracatheter Once PRN Truitt Merle, MD       leucovorin 840 mg in dextrose 5 % 250 mL infusion  400 mg/m2 (Order-Specific) Intravenous Once Truitt Merle, MD       oxaliplatin (ELOXATIN) 145 mg in dextrose 5 % 500 mL chemo infusion  70 mg/m2 (Order-Specific) Intravenous Once Truitt Merle, MD       sodium chloride flush (NS) 0.9 % injection 10 mL  10 mL Intracatheter PRN Truitt Merle, MD        PHYSICAL EXAMINATION: ECOG PERFORMANCE STATUS: 1 - Symptomatic but completely ambulatory  Vitals:   01/13/22 0837  BP: (!) 141/77  Pulse: 81  Resp: 18  Temp: (!) 97.5 F (36.4 C)  SpO2: 100%   Wt Readings from Last 3 Encounters:  01/13/22 209 lb 3.2 oz (94.9 kg)  01/07/22 208 lb 6.4 oz (94.5 kg)  12/24/21 208 lb 9.6 oz (94.6 kg)     GENERAL:alert, no distress and comfortable SKIN: skin color normal, no rashes or significant lesions EYES: normal, Conjunctiva are pink and non-injected, sclera clear  NEURO: alert & oriented x 3 with fluent speech  LABORATORY DATA:  I have reviewed the data as listed    Latest Ref Rng & Units 01/13/2022    8:20 AM 01/07/2022    9:20 AM 12/24/2021    8:28 AM  CBC  WBC 4.0 - 10.5 K/uL 3.1  3.6  2.9   Hemoglobin 13.0 - 17.0 g/dL 11.1  11.2  11.2   Hematocrit 39.0 - 52.0 % 31.0  30.8  32.6   Platelets 150 - 400 K/uL 100  65  134         Latest Ref Rng & Units 01/13/2022    8:20 AM 01/07/2022    9:20 AM 12/24/2021    8:28 AM  CMP  Glucose 70 - 99 mg/dL 189  198  232   BUN 8 - 23 mg/dL $Remove'15  11  14   'zTRidtu$ Creatinine 0.61 - 1.24 mg/dL 1.04  0.96  0.99   Sodium 135 - 145 mmol/L 136  138  135   Potassium 3.5 - 5.1 mmol/L 4.1  3.4  4.3   Chloride 98 - 111 mmol/L 102  106  103   CO2 22 - 32 mmol/L $RemoveB'28  24  28   'iLeTryKU$ Calcium 8.9 - 10.3 mg/dL 9.7  9.3  9.3   Total Protein 6.5 - 8.1 g/dL 6.6  6.4  7.0   Total Bilirubin 0.3 - 1.2 mg/dL 0.9  1.1  0.8   Alkaline Phos 38 - 126 U/L 105  105  91   AST 15  - 41 U/L $Remo'19  22  21   'pQXPX$ ALT 0 - 44 U/L 13  15  14       RADIOGRAPHIC STUDIES: I have personally reviewed the radiological images as listed and agreed with the findings in the report. No results found.   ASSESSMENT & PLAN:  KAYSTON JODOIN is a 88 y.o. male with    1. 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 Colonoscopy 07/23/21 showed 15 cm partially obstructing mass, path confirmed adenocarcinoma -staging CT CAP 07/30/21 showed: prominent but not pathologically enlarged local LN's; indeterminate groud glass opacity in right lung base; no distant mets. -Baseline CEA was normal, 1.35 (08/05/21) -s/p open right colectomy by Dr. Dema Severin 09/14/21, surgical path shows 9 cm poorly differentiated adenocarcinoma in ascending colon extending through muscular wall, lymph nodes negative. Dr. Dema Severin notes part of the tumor was wide open resulting in a defect in the colon in an area overlying the right kidney and lateral most part of the duodenum, consistent with a positive margin. -Molecular features showed loss of MLH1 and PMS2 with BRAF mutation and MLH1 hyper methylation overall indicative of a sporadic cancer rather than a germline/Lynch syndrome associated cancer. -he began adjuvant FOLFOX on 10/22/21. Plan for concurrent chemoRT with Xeloda after 3 months adjuvant chemo  2. 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 C/w mild IDA  -he began oral B12 and oral iron in 05/2021.  -He received IV Feraheme, 1 unit of blood, and vit B12 while hospitalized for surgery -he was given IV Feraheme with first two cycles FOLFOX   3.  Thrombocytopenia  -Secondary to chemotherapy -had to delay chemotherapy last week due to plt count of 65K     Plan: -Due for cycle 6 of FOLFOX today. -Labs reviewed and adequate for treatment.  WBC 3.1, hemoglobin 11.1, platelet 100 K.  Creatinine and LFTs are normal. -Proceed with cycle 6 today without any dose modifications. -Discussed role of adjuvant concurrent chemoradiation.  Patient is scheduled for simulation with Dr. Lisbeth Renshaw on 01/25/2022. -Reviewed capecitabine that will be given concurrently with radiation and provided patient education handout.  Dr. Burr Medico sent a prescription of capecitabine 500 mg with directions to take 3 tablets PO every 12 hours.  -RTC in 2 weeks for a toxicity check after starting radiation.     All questions were answered. The patient knows to call the clinic with any problems, questions or concerns. No barriers to learning was detected.  I have spent a total of 30 minutes minutes of face-to-face and non-face-to-face time, preparing to see the patient,  performing a medically appropriate examination, counseling and educating the patient, ordering medications/tests/procedures, documenting clinical information in the electronic health record,  and care coordination.   Joseph Query PA-C Dept of Hematology and [place] at [facility] Phone: [phone]

## 2022-01-14 ENCOUNTER — Other Ambulatory Visit: Payer: Self-pay

## 2022-01-15 ENCOUNTER — Inpatient Hospital Stay: Payer: Medicare Other

## 2022-01-15 ENCOUNTER — Other Ambulatory Visit: Payer: Self-pay

## 2022-01-15 VITALS — BP 126/64 | HR 88 | Temp 98.4°F | Resp 18

## 2022-01-15 DIAGNOSIS — C182 Malignant neoplasm of ascending colon: Secondary | ICD-10-CM

## 2022-01-15 DIAGNOSIS — D500 Iron deficiency anemia secondary to blood loss (chronic): Secondary | ICD-10-CM | POA: Diagnosis not present

## 2022-01-15 DIAGNOSIS — E538 Deficiency of other specified B group vitamins: Secondary | ICD-10-CM | POA: Diagnosis not present

## 2022-01-15 DIAGNOSIS — T451X5A Adverse effect of antineoplastic and immunosuppressive drugs, initial encounter: Secondary | ICD-10-CM | POA: Diagnosis not present

## 2022-01-15 DIAGNOSIS — D6959 Other secondary thrombocytopenia: Secondary | ICD-10-CM | POA: Diagnosis not present

## 2022-01-15 DIAGNOSIS — Z5111 Encounter for antineoplastic chemotherapy: Secondary | ICD-10-CM | POA: Diagnosis not present

## 2022-01-15 MED ORDER — SODIUM CHLORIDE 0.9% FLUSH
10.0000 mL | INTRAVENOUS | Status: DC | PRN
  Administered 2022-01-15: 10 mL

## 2022-01-15 MED ORDER — HEPARIN SOD (PORK) LOCK FLUSH 100 UNIT/ML IV SOLN
500.0000 [IU] | Freq: Once | INTRAVENOUS | Status: AC | PRN
  Administered 2022-01-15: 500 [IU]

## 2022-01-15 NOTE — Patient Instructions (Signed)

## 2022-01-20 ENCOUNTER — Telehealth: Payer: Self-pay

## 2022-01-20 ENCOUNTER — Encounter: Payer: Self-pay | Admitting: Nurse Practitioner

## 2022-01-20 DIAGNOSIS — E860 Dehydration: Secondary | ICD-10-CM | POA: Insufficient documentation

## 2022-01-20 DIAGNOSIS — C182 Malignant neoplasm of ascending colon: Secondary | ICD-10-CM

## 2022-01-20 NOTE — Telephone Encounter (Signed)
Pt called stating he's been having severe diarrhea since Saturday.  Pt stated he's having a maximum of 5 liquid like stools per day and is taking Imodium AD which does not seem to be helping.  Pt's last FOLFOX treatment was last Wednesday with pump d/c on Friday of last week.  Pt stated he's drinking Gatorade but does feels like he's probably dehydrated.  Informed pt that this RN will call him in the morning to give him a time if [facility] can see him on tomorrow.  Instructed pt to double up on the Imodium AD and to take the prescription Zofran to help with resolving his diarrhea.  Instructed pt to continue to drink Gatorade.  Pt verbalized understanding and had no further questions or concerns.  Notified Dr. Burr Medico and [facility] to see if they can see pt tomorrow.

## 2022-01-21 ENCOUNTER — Other Ambulatory Visit: Payer: Self-pay

## 2022-01-21 ENCOUNTER — Inpatient Hospital Stay (HOSPITAL_BASED_OUTPATIENT_CLINIC_OR_DEPARTMENT_OTHER): Payer: Medicare Other | Admitting: Physician Assistant

## 2022-01-21 ENCOUNTER — Emergency Department (HOSPITAL_COMMUNITY)
Admission: EM | Admit: 2022-01-21 | Discharge: 2022-01-21 | Disposition: A | Discharge status: Home / Self Care | Payer: Medicare Other | Attending: Emergency Medicine | Admitting: Emergency Medicine

## 2022-01-21 ENCOUNTER — Encounter (HOSPITAL_COMMUNITY): Payer: Self-pay

## 2022-01-21 ENCOUNTER — Inpatient Hospital Stay: Payer: Medicare Other

## 2022-01-21 VITALS — BP 66/45 | HR 70 | Resp 18

## 2022-01-21 VITALS — BP 62/48 | HR 82 | Temp 97.2°F | Resp 18

## 2022-01-21 DIAGNOSIS — Z452 Encounter for adjustment and management of vascular access device: Secondary | ICD-10-CM | POA: Insufficient documentation

## 2022-01-21 DIAGNOSIS — I959 Hypotension, unspecified: Secondary | ICD-10-CM | POA: Diagnosis not present

## 2022-01-21 DIAGNOSIS — D500 Iron deficiency anemia secondary to blood loss (chronic): Secondary | ICD-10-CM

## 2022-01-21 DIAGNOSIS — C189 Malignant neoplasm of colon, unspecified: Secondary | ICD-10-CM | POA: Insufficient documentation

## 2022-01-21 DIAGNOSIS — E860 Dehydration: Secondary | ICD-10-CM | POA: Diagnosis not present

## 2022-01-21 DIAGNOSIS — Z9049 Acquired absence of other specified parts of digestive tract: Secondary | ICD-10-CM | POA: Insufficient documentation

## 2022-01-21 DIAGNOSIS — Z9221 Personal history of antineoplastic chemotherapy: Secondary | ICD-10-CM | POA: Insufficient documentation

## 2022-01-21 DIAGNOSIS — C182 Malignant neoplasm of ascending colon: Secondary | ICD-10-CM | POA: Insufficient documentation

## 2022-01-21 DIAGNOSIS — Z79899 Other long term (current) drug therapy: Secondary | ICD-10-CM | POA: Insufficient documentation

## 2022-01-21 DIAGNOSIS — R197 Diarrhea, unspecified: Secondary | ICD-10-CM | POA: Insufficient documentation

## 2022-01-21 DIAGNOSIS — E876 Hypokalemia: Secondary | ICD-10-CM | POA: Diagnosis not present

## 2022-01-21 LAB — URINALYSIS, ROUTINE W REFLEX MICROSCOPIC
Bacteria, UA: NONE SEEN
Bilirubin Urine: NEGATIVE
Glucose, UA: NEGATIVE mg/dL
Hgb urine dipstick: NEGATIVE
Ketones, ur: NEGATIVE mg/dL
Leukocytes,Ua: NEGATIVE
Nitrite: NEGATIVE
Protein, ur: 100 mg/dL — AB
Specific Gravity, Urine: 1.024 (ref 1.005–1.030)
pH: 5 (ref 5.0–8.0)

## 2022-01-21 LAB — CBC WITH DIFFERENTIAL (CANCER CENTER ONLY)
Abs Immature Granulocytes: 0 10*3/uL (ref 0.00–0.07)
Basophils Absolute: 0 10*3/uL (ref 0.0–0.1)
Basophils Relative: 1 %
Eosinophils Absolute: 0 10*3/uL (ref 0.0–0.5)
Eosinophils Relative: 1 %
HCT: 31.5 % — ABNORMAL LOW (ref 39.0–52.0)
Hemoglobin: 11.4 g/dL — ABNORMAL LOW (ref 13.0–17.0)
Immature Granulocytes: 0 %
Lymphocytes Relative: 29 %
Lymphs Abs: 1.3 10*3/uL (ref 0.7–4.0)
MCH: 33.8 pg (ref 26.0–34.0)
MCHC: 36.2 g/dL — ABNORMAL HIGH (ref 30.0–36.0)
MCV: 93.5 fL (ref 80.0–100.0)
Monocytes Absolute: 0.8 10*3/uL (ref 0.1–1.0)
Monocytes Relative: 18 %
Neutro Abs: 2.3 10*3/uL (ref 1.7–7.7)
Neutrophils Relative %: 51 %
Platelet Count: 159 10*3/uL (ref 150–400)
RBC: 3.37 MIL/uL — ABNORMAL LOW (ref 4.22–5.81)
RDW: 14.9 % (ref 11.5–15.5)
WBC Count: 4.4 10*3/uL (ref 4.0–10.5)
nRBC: 0 % (ref 0.0–0.2)

## 2022-01-21 LAB — COMPREHENSIVE METABOLIC PANEL
ALT: 14 U/L (ref 0–44)
AST: 18 U/L (ref 15–41)
Albumin: 3 g/dL — ABNORMAL LOW (ref 3.5–5.0)
Alkaline Phosphatase: 72 U/L (ref 38–126)
Anion gap: 7 (ref 5–15)
BUN: 24 mg/dL — ABNORMAL HIGH (ref 8–23)
CO2: 19 mmol/L — ABNORMAL LOW (ref 22–32)
Calcium: 7.9 mg/dL — ABNORMAL LOW (ref 8.9–10.3)
Chloride: 111 mmol/L (ref 98–111)
Creatinine, Ser: 1.25 mg/dL — ABNORMAL HIGH (ref 0.61–1.24)
GFR, Estimated: 55 mL/min — ABNORMAL LOW (ref >60)
Glucose, Bld: 145 mg/dL — ABNORMAL HIGH (ref 70–99)
Potassium: 3.1 mmol/L — ABNORMAL LOW (ref 3.5–5.1)
Sodium: 137 mmol/L (ref 135–145)
Total Bilirubin: 1.2 mg/dL (ref 0.3–1.2)
Total Protein: 6 g/dL — ABNORMAL LOW (ref 6.5–8.1)

## 2022-01-21 LAB — MAGNESIUM: Magnesium: 1.5 mg/dL — ABNORMAL LOW (ref 1.7–2.4)

## 2022-01-21 MED ORDER — SODIUM CHLORIDE 0.9% FLUSH
10.0000 mL | Freq: Once | INTRAVENOUS | Status: AC
  Administered 2022-01-21: 10 mL

## 2022-01-21 MED ORDER — POTASSIUM CHLORIDE 20 MEQ PO PACK
40.0000 meq | PACK | Freq: Two times a day (BID) | ORAL | Status: DC
  Administered 2022-01-21: 40 meq via ORAL
  Filled 2022-01-21: qty 2

## 2022-01-21 MED ORDER — POTASSIUM CHLORIDE CRYS ER 20 MEQ PO TBCR: EXTENDED_RELEASE_TABLET | ORAL | 0 refills | Status: DC

## 2022-01-21 MED ORDER — POTASSIUM CHLORIDE 10 MEQ/100ML IV SOLN
10.0000 meq | INTRAVENOUS | Status: AC
  Administered 2022-01-21 (×2): 10 meq via INTRAVENOUS
  Filled 2022-01-21 (×2): qty 100

## 2022-01-21 MED ORDER — LACTATED RINGERS IV BOLUS
1000.0000 mL | Freq: Once | INTRAVENOUS | Status: AC
  Administered 2022-01-21: 1000 mL via INTRAVENOUS

## 2022-01-21 MED ORDER — HEPARIN SOD (PORK) LOCK FLUSH 100 UNIT/ML IV SOLN
INTRAVENOUS | Status: AC
  Filled 2022-01-21: qty 5

## 2022-01-21 MED ORDER — MAGNESIUM SULFATE 2 GM/50ML IV SOLN
2.0000 g | Freq: Once | INTRAVENOUS | Status: AC
  Administered 2022-01-21: 2 g via INTRAVENOUS
  Filled 2022-01-21: qty 50

## 2022-01-21 MED ORDER — SODIUM CHLORIDE 0.9 % IV SOLN: Freq: Once | INTRAVENOUS | Status: AC

## 2022-01-21 NOTE — ED Triage Notes (Signed)
Pt coming over from [facility]. Pt initial BP 57/43, bolus was started in [facility], currently 86/55 in triage in ED. Pt c/o diarrhea x6 days. Pt took his lisinopril today. Pt last chemo tx 8 days ago. Anda Kraft, [place] brought patient from [facility].

## 2022-01-21 NOTE — ED Provider Notes (Signed)
Clinical Course as of 01/21/22 1720  Thu Jan 21, 2022  1535 Stable:Transferred from cancer center for Hypotension.. Multiple laboratory abnormalities. Ambulate and likely DC as symptomatically improved.  [CC]  1637 I reassessed at bedside.  Patient has had gross symptomatic improvement.  He is now ambulatory tolerating p.o. intake and requesting discharge.  Has had no further episodes of diarrhea since has been here in the emergency department.  4 and half hours of observation and grossly symptomatically resolved.  Shared medical decision making regarding his multiple laboratory abnormalities and he feels comfortable with p.o. intake and follow-up with PCP in the outpatient setting at this time. [CC]    Clinical Course User Index [CC] Tretha Sciara, MD      Tretha Sciara, MD 01/21/22 [phone]

## 2022-01-21 NOTE — ED Provider Notes (Signed)
[facility] DEPT Provider Note   CSN: [id number] Arrival date & time: 01/21/22  1158     History  Chief Complaint  Patient presents with   Hypotension   Diarrhea    Joseph Pennington is a 88 y.o. male.  Pt with hx colon ca IIIB s/p right hemicolectomy, and recently completing 6th round chemotherapy ~ 1 week ago. 5 days ago onset of watery diarrhea, multiple episodes. No bloody bms. No recent abx use, known bad food ingestion or ill contacts. Denies abd pain or vomiting. Poor po intake since on chemotherapy. No dysuria or gu c/o. Pt seen at cancer center today, was noted to be hypotensive, and sent to ED. Denies fever, chills or sweats.   The history is provided by the patient, a relative and medical records.  Diarrhea Associated symptoms: no abdominal pain, no chills, no diaphoresis, no fever, no headaches and no vomiting        Home Medications Prior to Admission medications   Medication Sig Start Date End Date Taking? Authorizing Provider  acetaminophen (TYLENOL) 500 MG tablet Take 500-1,000 mg by mouth every 6 (six) hours as needed for moderate pain.    [provider]  ARTIFICIAL TEAR SOLUTION OP Place 1 drop into both eyes daily as needed (dry eyes).    [provider]  capecitabine (XELODA) 500 MG tablet Take 3 tablets every 12 hours, after meals. Take on days of radiation, Monday through Friday. 01/13/22   Truitt Merle, MD  cyanocobalamin (,VITAMIN B-12,) 1000 MCG/ML injection Inject 1,000 mcg into the muscle every 30 (thirty) days.  12/22/17   [provider]  Iron, Ferrous Sulfate, 325 (65 Fe) MG TABS Take 325 mg by mouth daily. 05/18/21   Sharyn Creamer, MD  levothyroxine (SYNTHROID) 75 MCG tablet Take 75 mcg by mouth daily before breakfast. 04/18/21   [provider]  lisinopril (PRINIVIL,ZESTRIL) 20 MG tablet Take 20 mg by mouth daily. 12/20/17   [provider]  ondansetron (ZOFRAN) 8 MG tablet Take 1  tablet (8 mg total) by mouth every 8 (eight) hours as needed for nausea or vomiting. 01/13/22   Dede Query T, PA-C  polyethylene glycol powder (GLYCOLAX/MIRALAX) 17 GM/SCOOP powder Take 17 g by mouth as needed for mild constipation. Patient not taking: Reported on 01/07/2022 05/18/21   Sharyn Creamer, MD  vitamin C (ASCORBIC ACID) 500 MG tablet Take 500 mg by mouth daily.    [provider]  prochlorperazine (COMPAZINE) 10 MG tablet Take 1 tablet (10 mg total) by mouth every 6 (six) hours as needed (Nausea or vomiting). 10/08/21 11/19/21  Truitt Merle, MD      Allergies    Patient has no known allergies.    Review of Systems   Review of Systems  Constitutional:  Negative for chills, diaphoresis and fever.  HENT:  Negative for sore throat.   Eyes:  Negative for redness.  Respiratory:  Negative for cough and shortness of breath.   Cardiovascular:  Negative for chest pain.  Gastrointestinal:  Positive for diarrhea. Negative for abdominal pain and vomiting.  Genitourinary:  Negative for dysuria and flank pain.  Musculoskeletal:  Negative for back pain and neck pain.  Skin:  Negative for rash.  Neurological:  Negative for headaches.  Hematological:  Does not bruise/bleed easily.  Psychiatric/Behavioral:  Negative for confusion.     Physical Exam Updated Vital Signs BP (!) 116/48   Pulse 65   Temp 97.9 F (36.6 C) (Rectal)  Resp 18   SpO2 99%  Physical Exam Vitals and nursing note reviewed.  Constitutional:      Appearance: Normal appearance. He is well-developed.  HENT:     Head: Atraumatic.     Nose: Nose normal.     Mouth/Throat:     Mouth: Mucous membranes are moist.     Pharynx: Oropharynx is clear.  Eyes:     General: No scleral icterus.    Conjunctiva/sclera: Conjunctivae normal.     Pupils: Pupils are equal, round, and reactive to light.  Neck:     Trachea: No tracheal deviation.  Cardiovascular:     Rate and Rhythm: Normal rate and regular rhythm.      Pulses: Normal pulses.     Heart sounds: Normal heart sounds. No murmur heard.    No friction rub. No gallop.  Pulmonary:     Effort: Pulmonary effort is normal. No accessory muscle usage or respiratory distress.     Breath sounds: Normal breath sounds.  Abdominal:     General: Bowel sounds are normal. There is no distension.     Palpations: Abdomen is soft.     Tenderness: There is no abdominal tenderness. There is no guarding.  Genitourinary:    Comments: No cva tenderness. Musculoskeletal:        General: No swelling or tenderness.     Cervical back: Normal range of motion and neck supple. No rigidity.  Skin:    General: Skin is warm and dry.     Findings: No rash.  Neurological:     Mental Status: He is alert.     Comments: Alert, speech clear.   Psychiatric:        Mood and Affect: Mood normal.     ED Results / Procedures / Treatments   Labs (all labs ordered are listed, but only abnormal results are displayed) Results for orders placed or performed during the hospital encounter of 01/21/22  Comprehensive metabolic panel  Result Value Ref Range   Sodium 137 135 - 145 mmol/L   Potassium 3.1 (L) 3.5 - 5.1 mmol/L   Chloride 111 98 - 111 mmol/L   CO2 19 (L) 22 - 32 mmol/L   Glucose, Bld 145 (H) 70 - 99 mg/dL   BUN 24 (H) 8 - 23 mg/dL   Creatinine, Ser 1.25 (H) 0.61 - 1.24 mg/dL   Calcium 7.9 (L) 8.9 - 10.3 mg/dL   Total Protein 6.0 (L) 6.5 - 8.1 g/dL   Albumin 3.0 (L) 3.5 - 5.0 g/dL   AST 18 15 - 41 U/L   ALT 14 0 - 44 U/L   Alkaline Phosphatase 72 38 - 126 U/L   Total Bilirubin 1.2 0.3 - 1.2 mg/dL   GFR, Estimated 55 (L) >60 mL/min   Anion gap 7 5 - 15  Magnesium  Result Value Ref Range   Magnesium 1.5 (L) 1.7 - 2.4 mg/dL      EKG None  Radiology No results found.  Procedures Procedures    Medications Ordered in ED Medications  potassium chloride (KLOR-CON) packet 40 mEq (40 mEq Oral Given 01/21/22 1443)  potassium chloride 10 mEq in 100 mL IVPB  (10 mEq Intravenous New Bag/Given 01/21/22 1443)  lactated ringers bolus 1,000 mL (0 mLs Intravenous Stopped 01/21/22 1434)  lactated ringers bolus 1,000 mL (1,000 mLs Intravenous New Bag/Given 01/21/22 1443)    ED Course/ Medical Decision Making/ A&P  Medical Decision Making Problems Addressed: Acute diarrhea: acute illness or injury with systemic symptoms that poses a threat to life or bodily functions Dehydration: acute illness or injury with systemic symptoms that poses a threat to life or bodily functions Hypokalemia: acute illness or injury Hypomagnesemia: acute illness or injury Malignant neoplasm of colon, unspecified part of colon ([place]): chronic illness or injury with exacerbation, progression, or side effects of treatment that poses a threat to life or bodily functions  Amount and/or Complexity of Data Reviewed Independent Historian:     Details: Family, hx External Data Reviewed: labs and notes. Labs: ordered. Decision-making details documented in ED Course. ECG/medicine tests: ordered and independent interpretation performed. Decision-making details documented in ED Course.  Risk Prescription drug management. Decision regarding hospitalization.   Iv ns. Continuous pulse ox and cardiac monitoring. Labs ordered/sent.   Diff dx includes dehydration, aki, acute diarrheal illness, medication side effect, etc. - dispo decision including potential need for admission considered - will give ivf, check labs, and dispo appropriately.   Reviewed nursing notes and prior charts for additional history. External reports reviewed. Additional history from: family.   Cardiac monitor: sinus rhythm, rate 66.  Labs reviewed/interpreted by me - k low. Kcl iv and po. Mg added. Mg low. Mg iv.   LR bolus.   Po fluids.   Additional LR bolus.    Bp improved from earlier. No abd pain or tenderness. Awaiting pending labs.  1530, labs pending, pt receiving fluids, bp  is improved - signed out to Dr Oswald Hillock to check pending labs and recheck post ivf boluses. Anticipate probable d/c to home then.              Final Clinical Impression(s) / ED Diagnoses Final diagnoses:  None    Rx / DC Orders ED Discharge Orders     None         Lajean Saver, MD 01/21/22 1534

## 2022-01-21 NOTE — ED Provider Triage Note (Signed)
Emergency Medicine Provider Triage Evaluation Note  Joseph Pennington , a 88 y.o. male  was evaluated in triage.  Pt complains of generalized weakness, low blood pressure, profuse watery diarrhea.  Patient had a 6 chemotherapy treatment last Wednesday and states that on Saturday he began to have diarrhea.  He states at first it was very foul-smelling but now is very watery with very little smell.  He has been taking Imodium with no relief.  He went to the cancer center this morning for evaluation after they advised him to take 2 Imodium and a Zofran and he still continue to have no relief.  He was found to be hypotensive with a systolic blood pressure in the 50s this morning.  He was transferred over to the emergency department for evaluation.  Currently he denies nausea, vomiting, stomach pain, shortness of breath.  Does endorse generalized weakness and 6 days of watery diarrhea  Review of Systems  Positive: As above Negative: As above  Physical Exam  BP 94/70   Pulse 70   Temp 97.9 F (36.6 C) (Rectal)   Resp 18   SpO2 99%  Gen:   Awake, no distress   Resp:  Normal effort  MSK:   Moves extremities without difficulty  Other:    Medical Decision Making  Medically screening exam initiated at 12:24 PM.  Appropriate orders placed.  Ayomikun D Hohmann was informed that the remainder of the evaluation will be completed by another provider, this initial triage assessment does not replace that evaluation, and the importance of remaining in the ED until their evaluation is complete.     Dorothyann Peng, PA-C 01/21/22 1227

## 2022-01-21 NOTE — Progress Notes (Signed)
Symptom Management Consult note [place]    Patient Care Team: Shirline Frees, MD as PCP - General (Family Medicine) Truitt Merle, MD as Consulting Physician (Hematology and Oncology)    Name of the patient: Joseph Pennington  [id number]  11-21-1931   Date of visit: 01/21/2022   Chief Complaint/Reason for visit: diarrhea   Current Therapy: Fluorouracil, leucovorin, oxaliplatin  Last treatment:  Day 1   Cycle 6 on 01/13/22   ASSESSMENT & PLAN: Patient is a 88 y.o. 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 Burr Medico.  I have viewed most recent oncology note and lab work.    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  Suspect this is from hypovolemia with his diarrhea and decreased p.o. intake.  Patient started immediately on a liter of NS. -Patient complained of bilateral blurry vision.  He had no facial droop and equal strength in all extremities.  Again suspect this is related to hypotension. -With patient's age and unstable vitals he needs higher level of care and was taken to the emergency department by myself and RN.  Dr. Burr Medico was made aware of plan. -Labs were collected prior to clinic appointment. CMP hemolyzed. CBC without neutropenia or leukocytosis.     Heme/Onc History: Oncology History  Ascending colon cancer s/p lap colectomy 09/14/2021  07/23/2021 Procedure   Upper GI Endoscopy/Colonoscopy; Dr. Lorenso Courier  Colonoscopy Impression: - Likely malignant partially obstructing tumor in the ascending colon. Biopsied. - Six 3 to 8 mm polyps in the rectum and in the transverse colon, removed with a cold snare. Resected and  retrieved. - Non-bleeding internal hemorrhoids.  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 Biopsied. - Multiple gastric polyps. Resected and retrieved. - Erythematous mucosa in the antrum. Biopsied. - Mucosal nodule found in the duodenum. Biopsied. - Dilated lacteals were found in the duodenum. Biopsied. - Two non-bleeding angioectasias in the duodenum.   07/23/2021 Initial Biopsy   Diagnosis 1. Duodenum, Biopsy - BENIGN DUODENAL MUCOSA - NO ACUTE INFLAMMATION, VILLOUS BLUNTING OR INCREASED INTRAEPITHELIAL LYMPHOCYTES IDENTIFIED 2. Duodenum, Biopsy, Nodule - PEPTIC DUODENITIS - NO DYSPLASIA OR MALIGNANCY IDENTIFIED 3. Stomach, biopsy - MILD CHRONIC GASTRITIS WITHOUT ACTIVITY - NO INTESTINAL METAPLASIA IDENTIFIED - SEE COMMENT 4. Stomach, polyp(s) - HYPERPLASTIC GASTRIC POLYP(S) WITH INTESTINAL METAPLASIA - NO H. PYLORI OR MALIGNANCY IDENTIFIED 5. Esophagogastric junction, biopsy - SQUAMOCOLUMNAR JUNCTION WITH CHRONIC INFLAMMATION - NO INTESTINAL METAPLASIA, DYSPLASIA OR MALIGNANCY IDENTIFIED 6. Rectum, biopsy, and transverse - MULTIPLE FRAGMENTS OF HYPERPLASTIC POLYP(S) - NO HIGH-GRADE DYSPLASIA OR MALIGNANCY IDENTIFIED 7. Ascending Colon Biopsy, Mass - POORLY DIFFERENTIATED ADENOCARCINOMA - SEE COMMENT  Microscopic Comment 7. By immunohistochemistry, the neoplastic cells are positive for CDX2 (patchy, weak) but negative for p63, p40, CD56, chromogranin and synaptophysin. This immunoprofile is consistent with a poorly differentiated adenocarcinoma.  3. H. pylori immunohistochemistry is POSITIVE for RARE microorganisms.   07/30/2021 Imaging   EXAM: CT CHEST, ABDOMEN, AND PELVIS WITH CONTRAST  IMPRESSION: 1. Masslike region in the ascending colon extending to the level of the hepatic flexure in the right lower quadrant, concerning for malignancy. Associated pericolonic fat stranding and a few prominent lymph  nodes  are seen adjacent to this segment of colon, suspicious for local infiltration. No distant metastasis is seen. 2. Left inguinal hernia containing nonobstructed colon. 3. Nonspecific small ground-glass opacity in the in the right lower lobe. Attention on follow-up is recommended. 4. Small hiatal hernia. 5. Aortic atherosclerosis and coronary artery calcifications   08/06/2021 Initial Diagnosis   Malignant neoplasm of ascending colon [facility])   Primary adenocarcinoma of ascending colon ([place])  07/23/2021 Procedure   Colonoscopy by Dr. Lorenso Courier - A frond-like/villous, fungating and ulcerated partially obstructing large mass was found in the ascending colon. There was a point at which the mass could not be bypassed, even with use of an ultraslim colonoscope. It is suspected that this mass encompasses the entire cecum and ascending colon. The mass was circumferential. The mass measured fifteen cm in length. Oozing was present.    07/23/2021 Initial Biopsy   3. H. pylori immunohistochemistry is POSITIVE for RARE microorganisms.  FINAL DIAGNOSIS Diagnosis 1. Duodenum, Biopsy - BENIGN DUODENAL MUCOSA - NO ACUTE INFLAMMATION, VILLOUS BLUNTING OR INCREASED INTRAEPITHELIAL LYMPHOCYTES IDENTIFIED 2. Duodenum, Biopsy, Nodule - PEPTIC DUODENITIS - NO DYSPLASIA OR MALIGNANCY IDENTIFIED 3. Stomach, biopsy - MILD CHRONIC GASTRITIS WITHOUT ACTIVITY - NO INTESTINAL METAPLASIA IDENTIFIED - SEE COMMENT 4. Stomach, polyp(s) - HYPERPLASTIC GASTRIC POLYP(S) WITH INTESTINAL METAPLASIA - NO H. PYLORI OR MALIGNANCY IDENTIFIED 5. Esophagogastric junction, biopsy - SQUAMOCOLUMNAR JUNCTION WITH CHRONIC INFLAMMATION - NO INTESTINAL METAPLASIA, DYSPLASIA OR MALIGNANCY IDENTIFIED 6. Rectum, biopsy, and transverse - MULTIPLE FRAGMENTS OF HYPERPLASTIC POLYP(S) - NO HIGH-GRADE DYSPLASIA OR MALIGNANCY IDENTIFIED 7. Ascending Colon Biopsy, Mass - POORLY DIFFERENTIATED ADENOCARCINOMA - SEE COMMENT   07/30/2021  Imaging   CT CAP IMPRESSION: 1. Masslike region in the ascending colon extending to the level of the hepatic flexure in the right lower quadrant, concerning for malignancy. Associated pericolonic fat stranding and a few prominent lymph nodes are seen adjacent to this segment of colon, suspicious for local infiltration. No distant metastasis is seen. 2. Left inguinal hernia containing nonobstructed colon. 3. Nonspecific small ground-glass opacity in the in the right lower lobe. Attention on follow-up is recommended. 4. Small hiatal hernia. 5. Aortic atherosclerosis and coronary artery calcifications   09/14/2021 Surgery   PROCEDURE:  Laparoscopic converted to open right hemicolectomy Repair of small bowel serosa x 2     09/14/2021 Pathology Results   A. COLON, RIGHT, HEMI COLECTOMY:  Poorly differentiated adenocarcinoma in the ascending colon with clear margins of resection.  Tumor Size: Circumferential measuring 9.0 cm X 8.0 cm. X 2.5 cm.  Macroscopic Tumor Perforation: Not identified  Histologic Type: Adenocarcinoma.  Histologic Grade: Grade 3, poorly differentiated.  Multiple Primary Sites: Not applicable  Tumor Extension: Extends through the muscular wall into the pericolonic adipose tissue.  Lymphovascular Invasion: Not identified.  Perineural Invasion: Not identified.  Treatment Effect: No known presurgical therapy.  Margins:       Margin Status for Invasive Carcinoma: All margins are negative for invasive carcinoma       Distance from Invasive Carcinoma to proximal margin: 11 cm.       Distance from invasive carcinoma to distal margin: 12 cm.  Regional Lymph Nodes:       Number of Lymph Nodes with Tumor: None.       Number of Lymph Nodes Examined: Twenty-two (22)  Tumor Deposits: Present (slide A11)  Distant Metastasis:       Distant Site(s) Involved: None known.  Pathologic Stage Classification (pTNM, AJCC 8th Edition): pT3 pN1c  Comments: Omentum  is free of neoplastic  invasion.                      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LAMN)    09/14/2021 Cancer Staging   Staging form: Colon and Rectum, AJCC 8th Edition - Pathologic stage from 09/14/2021: Stage IIIB (pT3, pN1c, cM0) - Signed by Alla Feeling, NP on 10/08/2021 Stage prefix: Initial diagnosis Total positive nodes: 0 Histologic grading system: 4 grade system Histologic grade (G): G3 Laterality: Right Lymph-vascular invasion (LVI): LVI not present (absent)/not identified Tumor deposits (TD): Present Perineural invasion (PNI): Absent Microsatellite instability (MSI): Unstable high BRAF mutation: Positive   10/08/2021 Initial Diagnosis   Primary adenocarcinoma of ascending colon ([place])   10/22/2021 - 11/07/2021 Chemotherapy   Patient is on Treatment Plan : COLORECTAL FOLFOX q14d x 3 months     10/22/2021 -  Chemotherapy   Patient is on Treatment Plan : COLORECTAL FOLFOX q14d x 3 months         Interval history-: Joseph Pennington is a 88 y.o. male with oncologic history as above presenting to [facility] today with chief complaint of diarrhea x6 days.  He is accompanied by his son who provides additional history.  Patient states he has had over 10 episodes of liquid brown stool in the last 24 hours.  He denies any recent antibiotic use.  He states he had diarrhea starting after cycle 5 of chemotherapy however it was manageable with Imodium.  Patient had cycle 6 of chemotherapy x8 days ago.  Imodium is not helping his diarrhea.  He is also endorsing abdominal cramping prior to having bowel movements.  He states his appetite has been decreasing with chemotherapy and he has only had a few glasses of water to drink in the last 24 hours.  He feels terrible he tells me.  He denies any fever, chills, shortness of breath, chest pain, urinary symptoms.      ROS  All other systems are reviewed and are negative for acute change except as noted in the HPI.    No Known Allergies   Past Medical  History:  Diagnosis Date   Acute cholecystitis 02/19/2018   Anemia    Chronic kidney disease    Colon cancer ([place])    Diabetes mellitus without complication (HCC)    diet controlled   GERD (gastroesophageal reflux disease)    History of blood transfusion 08/2021   History of hiatal hernia    Hypertension    Hypothyroidism    Iron deficiency anemia    Peripheral neuropathy    bilateral feet     Past Surgical History:  Procedure Laterality Date   CHOLECYSTECTOMY N/A 02/20/2018   Procedure: LAPAROSCOPIC CHOLECYSTECTOMY;  Surgeon: Excell Seltzer, MD;  Location: [facility];  Service: General;  Laterality: N/A;   COLONOSCOPY  2023   LAPAROSCOPIC RIGHT HEMI COLECTOMY Right 09/14/2021   Procedure: LAPAROSCOPIC TO OPEN RIGHT HEMI COLECTOMY;  Surgeon: Ileana Roup, MD;  Location: [facility];  Service: General;  Laterality: Right;   PORTACATH PLACEMENT N/A 10/15/2021   Procedure: INSERTION PORT-A-CATH;  Surgeon: Ileana Roup, MD;  Location: [facility];  Service: General;  Laterality: N/A;  with ultrasound and flouroscopic guidance    Social History   Socioeconomic History   Marital status: Married    Spouse name: Not on file   Number of children: Not on file   Years of education: Not on file   Highest education level: Not on file  Occupational History   Not on file  Tobacco Use   Smoking status: Former    Packs/day: 1.00    Years: 20.00    Total pack years: 20.00    Types: Cigarettes    Quit date: 1980    Years since quitting: 43.8   Smokeless tobacco: Never   Tobacco comments:    Smoked cigarettes x20 years, </= 1 PPD. Quit 45 years ago  Vaping Use   Vaping Use: Never used  Substance and Sexual Activity   Alcohol use: Not Currently   Drug use: Not Currently   Sexual activity: Not on file  Other Topics Concern   Not on file  Social History Narrative   Not on file   Social Determinants of Health   Financial Resource Strain: Not on file  Food Insecurity: Not  on file  Transportation Needs: Not on file  Physical Activity: Not on file  Stress: Not on file  Social Connections: Not on file  Intimate Partner Violence: Not on file    Family History  Problem Relation Age of Onset   Rectal cancer Mother    Heart attack Father      Current Outpatient Medications:    acetaminophen (TYLENOL) 500 MG tablet, Take 500-1,000 mg by mouth every 6 (six) hours as needed for moderate pain., Disp: , Rfl:    ARTIFICIAL TEAR SOLUTION OP, Place 1 drop into both eyes daily as needed (dry eyes)., Disp: , Rfl:    capecitabine (XELODA) 500 MG tablet, Take 3 tablets every 12 hours, after meals. Take on days of radiation, Monday through Friday., Disp: 90 tablet, Rfl: 1   cyanocobalamin (,VITAMIN B-12,) 1000 MCG/ML injection, Inject 1,000 mcg into the muscle every 30 (thirty) days. , Disp: , Rfl: 0   Iron, Ferrous Sulfate, 325 (65 Fe) MG TABS, Take 325 mg by mouth daily., Disp: , Rfl:    levothyroxine (SYNTHROID) 75 MCG tablet, Take 75 mcg by mouth daily before breakfast., Disp: , Rfl:    lisinopril (PRINIVIL,ZESTRIL) 20 MG tablet, Take 20 mg by mouth daily., Disp: , Rfl: 3   ondansetron (ZOFRAN) 8 MG tablet, Take 1 tablet (8 mg total) by mouth every 8 (eight) hours as needed for nausea or vomiting., Disp: 90 tablet, Rfl: 0   polyethylene glycol powder (GLYCOLAX/MIRALAX) 17 GM/SCOOP powder, Take 17 g by mouth as needed for mild constipation. (Patient not taking: Reported on 01/07/2022), Disp: , Rfl:    vitamin C (ASCORBIC ACID) 500 MG tablet, Take 500 mg by mouth daily., Disp: , Rfl:   PHYSICAL EXAM: ECOG FS:2 - Symptomatic, <50% confined to bed    Vitals:   01/21/22 1141 01/21/22 1146  BP: (!) 57/43 (!) 66/45  Pulse: 69 70  Resp: 18   SpO2: 100% 100%   Physical Exam Vitals and nursing note reviewed.  Constitutional:      Appearance: He is well-developed. He is ill-appearing. He is not toxic-appearing.  HENT:     Head: Normocephalic.     Nose: Nose normal.      Mouth/Throat:     Mouth: Mucous membranes are dry.  Eyes:     Conjunctiva/sclera: Conjunctivae normal.  Neck:     Vascular: No JVD.  Cardiovascular:     Rate and Rhythm: Normal rate and regular rhythm.     Pulses: Normal pulses.     Heart sounds: Normal heart sounds.  Pulmonary:     Effort: Pulmonary effort is normal.     Breath sounds: Normal breath  sounds.  Abdominal:     General: There is no distension.  Musculoskeletal:     Cervical back: Normal range of motion.  Skin:    General: Skin is warm and dry.  Neurological:     Mental Status: He is oriented to person, place, and time.     Comments: Equal grip strength in all extremities. No facial droop, clear speech. Alert and oriented.        LABORATORY DATA: I have reviewed the data as listed    Latest Ref Rng & Units 01/13/2022    8:20 AM 01/07/2022    9:20 AM 12/24/2021    8:28 AM  CBC  WBC 4.0 - 10.5 K/uL 3.1  3.6  2.9   Hemoglobin 13.0 - 17.0 g/dL 11.1  11.2  11.2   Hematocrit 39.0 - 52.0 % 31.0  30.8  32.6   Platelets 150 - 400 K/uL 100  65  134         Latest Ref Rng & Units 01/13/2022    8:20 AM 01/07/2022    9:20 AM 12/24/2021    8:28 AM  CMP  Glucose 70 - 99 mg/dL 189  198  232   BUN 8 - 23 mg/dL _0 Creatinine 0.61 - 1.24 mg/dL 1.04  0.96  0.99   Sodium 135 - 145 mmol/L 136  138  135   Potassium 3.5 - 5.1 mmol/L 4.1  3.4  4.3   Chloride 98 - 111 mmol/L 102  106  103   CO2 22 - 32 mmol/L _1 Calcium 8.9 - 10.3 mg/dL 9.7  9.3  9.3   Total Protein 6.5 - 8.1 g/dL 6.6  6.4  7.0   Total Bilirubin 0.3 - 1.2 mg/dL 0.9  1.1  0.8   Alkaline Phos 38 - 126 U/L 105  105  91   AST 15 - 41 U/L _2 ALT 0 - 44 U/L _3 RADIOGRAPHIC STUDIES (from last 24 hours if applicable) I have personally reviewed the radiological images as listed and agreed with the findings in the report. No results found.      Visit Diagnosis: 1. Diarrhea, unspecified type   2.  Hypotension, unspecified hypotension type   3. Malignant neoplasm of ascending colon (HCC)      No orders of the defined types were placed in this encounter.   All questions were answered. No barriers to learning was detected.  I have spent a total of 30 minutes minutes of face-to-face and non-face-to-face time, preparing to see the patient, obtaining and/or reviewing separately obtained history, performing a medically appropriate examination, counseling and educating the patient, ordering tests, documenting clinical information in the electronic health record, and care coordination (communications with other health care professionals or caregivers).    Thank you for allowing me to participate in the care of this patient.    Barrie Folk, PA-C Department of Hematology/Oncology [facility] Phone: [phone]  [phone]    01/21/2022 9:45 AM

## 2022-01-21 NOTE — Progress Notes (Signed)
Orders entered for [facility] visit. 

## 2022-01-21 NOTE — Discharge Instructions (Addendum)
It was our pleasure to provide your ER care today - we hope that you feel better.  Drink plenty of fluids/stay well hydrated. May try imodium as need.   From today's labs, your potassium level is low - eat plenty of fruits and vegetables, take potassium supplement as prescribed, and follow up with your doctor in one week.   Return to ER if worse, new symptoms, fevers, new, worsening or severe pain, severe abdominal pain, persistent vomiting, fainting, trouble breathing, or other emergency concern.

## 2022-01-21 NOTE — ED Notes (Signed)
Joseph Pennington, [place] bedside.

## 2022-01-21 NOTE — ED Notes (Signed)
Pt was given a coke.

## 2022-01-21 NOTE — ED Notes (Signed)
Pt provided with saltine crackers and water

## 2022-01-22 ENCOUNTER — Encounter: Payer: Self-pay | Admitting: Hematology

## 2022-01-22 DIAGNOSIS — C182 Malignant neoplasm of ascending colon: Secondary | ICD-10-CM | POA: Diagnosis not present

## 2022-01-22 NOTE — Addendum Note (Signed)
Encounter addended by: Kyung Rudd, MD on: 01/22/2022 1:27 PM  Actions taken: Edit attestation on clinical note

## 2022-01-25 ENCOUNTER — Other Ambulatory Visit: Payer: Self-pay

## 2022-01-25 ENCOUNTER — Other Ambulatory Visit (HOSPITAL_COMMUNITY): Payer: Self-pay

## 2022-01-25 ENCOUNTER — Ambulatory Visit
Admission: RE | Admit: 2022-01-25 | Discharge: 2022-01-25 | Disposition: A | Discharge status: Home / Self Care | Payer: Medicare Other | Source: Ambulatory Visit | Attending: Radiation Oncology | Admitting: Radiation Oncology

## 2022-01-25 ENCOUNTER — Telehealth: Payer: Self-pay | Admitting: Pharmacist

## 2022-01-25 DIAGNOSIS — Z510 Encounter for antineoplastic radiation therapy: Secondary | ICD-10-CM | POA: Insufficient documentation

## 2022-01-25 DIAGNOSIS — C182 Malignant neoplasm of ascending colon: Secondary | ICD-10-CM

## 2022-01-25 MED ORDER — CAPECITABINE 500 MG PO TABS
ORAL_TABLET | ORAL | 1 refills | Status: DC
  Filled 2022-01-25: qty 90, fill #0
  Filled 2022-01-25: qty 90, 21d supply, fill #0
  Filled 2022-03-01: qty 90, 21d supply, fill #1

## 2022-01-25 NOTE — Telephone Encounter (Signed)
Oral Chemotherapy Pharmacist Encounter  I spoke with patient's daughter, Kennyth Lose, for overview of: Xeloda for the treatment of stage IIIB colon cancer in conjunction with radiation, planned duration 5 1/2 weeks.   Counseled on administration, dosing, side effects, monitoring, drug-food interactions, safe handling, storage, and disposal.  Patient will take Xeloda '500mg'$  tablets, 3 tablets ('1500mg'$ ) by mouth in AM and 3 tabs ('1500mg'$ ) by mouth in PM, within 30 minutes of finishing meals, on days of radiation only.  Xeloda and radiation start date: 02/01/22  Adverse effects of Xeloda include but are not limited to: fatigue, decreased blood counts, GI upset, diarrhea, mouth sores, and hand-foot syndrome. Hand-foot syndrome: discussed use of cream such as [place]dderly Smooth Extra Care 20 or equivalent advanced care cream that has 20% urea content for advanced skin hydration while on Xeloda Diarrhea: Patient will obtain Imodium (loperamide) to have on hand if they experience diarrhea. Patient knows to alert the office of 4 or more loose stools above baseline.  Reviewed importance of keeping a medication schedule and plan for any missed doses. No barriers to medication adherence identified.  Medication reconciliation performed and medication/allergy list updated.  Insurance authorization for Xeloda has been obtained. Test claim at the pharmacy revealed copayment $25.84 for 1st fill of Xeloda. Patient's family will pick this up from the [facility] on 01/29/22 after patient's appointment.  Patient's daughter informed the pharmacy will reach out 5-7 days prior to needing next fill of Xeloda to coordinate continued medication acquisition to prevent break in therapy.  All questions answered.  Santiago Glad voiced understanding and appreciation.   Medication education handout placed in mail for patient and patient's family. Patient's family knows to call the office with questions or  concerns. Oral Chemotherapy Clinic phone number provided.   Leron Croak, PharmD, BCPS, [facility] Hematology/Oncology Clinical Pharmacist [facility] [place] [phone] 01/25/2022 10:41 AM

## 2022-01-26 ENCOUNTER — Other Ambulatory Visit: Payer: Self-pay

## 2022-01-29 ENCOUNTER — Other Ambulatory Visit: Payer: Self-pay

## 2022-01-29 ENCOUNTER — Inpatient Hospital Stay: Payer: Medicare Other

## 2022-01-29 ENCOUNTER — Inpatient Hospital Stay (HOSPITAL_BASED_OUTPATIENT_CLINIC_OR_DEPARTMENT_OTHER): Payer: Medicare Other | Admitting: Hematology

## 2022-01-29 VITALS — BP 127/66 | HR 88 | Temp 97.7°F | Resp 19 | Ht 73.0 in | Wt 200.6 lb

## 2022-01-29 DIAGNOSIS — R197 Diarrhea, unspecified: Secondary | ICD-10-CM

## 2022-01-29 DIAGNOSIS — C182 Malignant neoplasm of ascending colon: Secondary | ICD-10-CM

## 2022-01-29 DIAGNOSIS — Z510 Encounter for antineoplastic radiation therapy: Secondary | ICD-10-CM | POA: Diagnosis not present

## 2022-01-29 LAB — CMP (CANCER CENTER ONLY)
ALT: 10 U/L (ref 0–44)
AST: 14 U/L — ABNORMAL LOW (ref 15–41)
Albumin: 3.5 g/dL (ref 3.5–5.0)
Alkaline Phosphatase: 87 U/L (ref 38–126)
Anion gap: 7 (ref 5–15)
BUN: 20 mg/dL (ref 8–23)
CO2: 23 mmol/L (ref 22–32)
Calcium: 9.5 mg/dL (ref 8.9–10.3)
Chloride: 105 mmol/L (ref 98–111)
Creatinine: 1.32 mg/dL — ABNORMAL HIGH (ref 0.61–1.24)
GFR, Estimated: 52 mL/min — ABNORMAL LOW (ref >60)
Glucose, Bld: 150 mg/dL — ABNORMAL HIGH (ref 70–99)
Potassium: 4.7 mmol/L (ref 3.5–5.1)
Sodium: 135 mmol/L (ref 135–145)
Total Bilirubin: 1 mg/dL (ref 0.3–1.2)
Total Protein: 7 g/dL (ref 6.5–8.1)

## 2022-01-29 LAB — CBC WITH DIFFERENTIAL (CANCER CENTER ONLY)
Abs Immature Granulocytes: 0.01 10*3/uL (ref 0.00–0.07)
Basophils Absolute: 0 10*3/uL (ref 0.0–0.1)
Basophils Relative: 0 %
Eosinophils Absolute: 0 10*3/uL (ref 0.0–0.5)
Eosinophils Relative: 1 %
HCT: 30.9 % — ABNORMAL LOW (ref 39.0–52.0)
Hemoglobin: 10.9 g/dL — ABNORMAL LOW (ref 13.0–17.0)
Immature Granulocytes: 0 %
Lymphocytes Relative: 22 %
Lymphs Abs: 0.7 10*3/uL (ref 0.7–4.0)
MCH: 34.8 pg — ABNORMAL HIGH (ref 26.0–34.0)
MCHC: 35.3 g/dL (ref 30.0–36.0)
MCV: 98.7 fL (ref 80.0–100.0)
Monocytes Absolute: 0.6 10*3/uL (ref 0.1–1.0)
Monocytes Relative: 18 %
Neutro Abs: 2 10*3/uL (ref 1.7–7.7)
Neutrophils Relative %: 59 %
Platelet Count: 154 10*3/uL (ref 150–400)
RBC: 3.13 MIL/uL — ABNORMAL LOW (ref 4.22–5.81)
RDW: 14.6 % (ref 11.5–15.5)
WBC Count: 3.4 10*3/uL — ABNORMAL LOW (ref 4.0–10.5)
nRBC: 0 % (ref 0.0–0.2)

## 2022-01-29 LAB — C DIFFICILE QUICK SCREEN W PCR REFLEX
C Diff antigen: NEGATIVE
C Diff interpretation: NOT DETECTED
C Diff toxin: NEGATIVE

## 2022-01-29 NOTE — Progress Notes (Addendum)
[place]   [phone] [phone]) [phone]   Clinic Follow up Note   Patient Care Team: Shirline Frees, MD as PCP - General (Family Medicine) Truitt Merle, MD as Consulting Physician (Hematology and Oncology)  Date of Service:  01/29/2022  CHIEF COMPLAINT: f/u of colon cancer  CURRENT THERAPY:  Adjuvant FOLFOX, q14d, starting 10/22/21   ASSESSMENT & PLAN:  Joseph Pennington is a 88 y.o. male with   1. 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 Staging CT negative for distant mets. Baseline CEA was WNL. -s/p open right colectomy by Dr. Dema Severin 09/14/21, path showed 9 cm adenocarcinoma extending through muscular wall, lymph nodes negative. Loss of MLH1 and PMS2 with BRAF mutation and MLH1 hyper methylation. -he completed 6 cycles adjuvant FOLFOX 10/22/21 - 01/13/22. He tolerated moderately well with progressive side effects-- fatigue/weakness, taste changes/loss, nausea, and diarrhea. He is still experiencing watery diarrhea and has lost weight. -he is scheduled to start concurrent chemoRT with Xeloda on Monday, 11/13. Given his continued side effects, I recommend delaying until after Thanksgiving. I will reach out to Dr. Lisbeth Renshaw. -labs reviewed, CMP actually improved except creatinine 1.32. CBC overall stable with persistent anemia, hgb 10.9. -We reviewed the potential side effect from Xeloda, especially skin toxicity, and the management, he voiced good understanding and agrees to proceed.  We will start on first day of radiation   2. Diarrhea -secondary to chemo, worsened with each cycle -was evaluated at ED, improved overall but still persists today and continues to be watery. -will obtain stool test today, order was previously placed but never completed.  3. IDA secondary  to blood loss from #1, low-normal range B12 -hgb slowly started dropping from 02/2018 -he began oral B12 and oral iron in 05/2021.  -He received IV Feraheme, 1 unit of blood, and vit B12 while hospitalized for surgery -he was given IV Feraheme with first two cycles FOLFOX.     Plan: -obtain stool sample today to rule out C-DIFF  -we will reach out to rad onc to discuss delaying his radiation start due to persistent diarrhea  -He will start Xeloda on first day of RT  -lab weekly and f/u on week 1, 3, 5 during RT, sTARTING 11/27    No problem-specific Assessment & Plan notes found for this encounter.   SUMMARY OF ONCOLOGIC HISTORY: Oncology History  Ascending colon cancer s/p lap colectomy 09/14/2021  07/23/2021 Procedure   Upper GI Endoscopy/Colonoscopy; Dr. Lorenso Courier  Colonoscopy Impression: - Likely malignant partially obstructing tumor in the ascending colon. Biopsied. - Six 3 to 8 mm polyps in the rectum and in the transverse colon, removed with a cold snare. Resected and retrieved. - Non-bleeding internal hemorrhoids.  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 Biopsied. - Multiple gastric polyps. Resected and retrieved. - Erythematous mucosa in the antrum. Biopsied. - Mucosal nodule found in the duodenum. Biopsied. - Dilated lacteals were found in the duodenum. Biopsied. - Two non-bleeding angioectasias in the duodenum.   07/23/2021 Initial Biopsy   Diagnosis 1. Duodenum, Biopsy - BENIGN DUODENAL MUCOSA - NO ACUTE INFLAMMATION, VILLOUS BLUNTING OR INCREASED INTRAEPITHELIAL LYMPHOCYTES IDENTIFIED 2. Duodenum, Biopsy, Nodule - PEPTIC DUODENITIS - NO DYSPLASIA OR MALIGNANCY IDENTIFIED 3. Stomach, biopsy - MILD CHRONIC GASTRITIS WITHOUT ACTIVITY - NO INTESTINAL METAPLASIA IDENTIFIED -  SEE COMMENT 4. Stomach, polyp(s) - HYPERPLASTIC GASTRIC POLYP(S) WITH INTESTINAL METAPLASIA - NO H. PYLORI OR MALIGNANCY  IDENTIFIED 5. Esophagogastric junction, biopsy - SQUAMOCOLUMNAR JUNCTION WITH CHRONIC INFLAMMATION - NO INTESTINAL METAPLASIA, DYSPLASIA OR MALIGNANCY IDENTIFIED 6. Rectum, biopsy, and transverse - MULTIPLE FRAGMENTS OF HYPERPLASTIC POLYP(S) - NO HIGH-GRADE DYSPLASIA OR MALIGNANCY IDENTIFIED 7. Ascending Colon Biopsy, Mass - POORLY DIFFERENTIATED ADENOCARCINOMA - SEE COMMENT  Microscopic Comment 7. By immunohistochemistry, the neoplastic cells are positive for CDX2 (patchy, weak) but negative for p63, p40, CD56, chromogranin and synaptophysin. This immunoprofile is consistent with a poorly differentiated adenocarcinoma.  3. H. pylori immunohistochemistry is POSITIVE for RARE microorganisms.   07/30/2021 Imaging   EXAM: CT CHEST, ABDOMEN, AND PELVIS WITH CONTRAST  IMPRESSION: 1. Masslike region in the ascending colon extending to the level of the hepatic flexure in the right lower quadrant, concerning for malignancy. Associated pericolonic fat stranding and a few prominent lymph nodes are seen adjacent to this segment of colon, suspicious for local infiltration. No distant metastasis is seen. 2. Left inguinal hernia containing nonobstructed colon. 3. Nonspecific small ground-glass opacity in the in the right lower lobe. Attention on follow-up is recommended. 4. Small hiatal hernia. 5. Aortic atherosclerosis and coronary artery calcifications   08/06/2021 Initial Diagnosis   Malignant neoplasm of ascending colon [facility])   Primary adenocarcinoma of ascending colon ([place])  07/23/2021 Procedure   Colonoscopy by Dr. Lorenso Courier - A frond-like/villous, fungating and ulcerated partially obstructing large mass was found in the ascending colon. There was a point at which the mass could not be bypassed, even with use of an ultraslim colonoscope. It is suspected that this mass encompasses the entire cecum and ascending colon. The mass was circumferential. The mass measured fifteen cm in  length. Oozing was present.    07/23/2021 Initial Biopsy   3. H. pylori immunohistochemistry is POSITIVE for RARE microorganisms.  FINAL DIAGNOSIS Diagnosis 1. Duodenum, Biopsy - BENIGN DUODENAL MUCOSA - NO ACUTE INFLAMMATION, VILLOUS BLUNTING OR INCREASED INTRAEPITHELIAL LYMPHOCYTES IDENTIFIED 2. Duodenum, Biopsy, Nodule - PEPTIC DUODENITIS - NO DYSPLASIA OR MALIGNANCY IDENTIFIED 3. Stomach, biopsy - MILD CHRONIC GASTRITIS WITHOUT ACTIVITY - NO INTESTINAL METAPLASIA IDENTIFIED - SEE COMMENT 4. Stomach, polyp(s) - HYPERPLASTIC GASTRIC POLYP(S) WITH INTESTINAL METAPLASIA - NO H. PYLORI OR MALIGNANCY IDENTIFIED 5. Esophagogastric junction, biopsy - SQUAMOCOLUMNAR JUNCTION WITH CHRONIC INFLAMMATION - NO INTESTINAL METAPLASIA, DYSPLASIA OR MALIGNANCY IDENTIFIED 6. Rectum, biopsy, and transverse - MULTIPLE FRAGMENTS OF HYPERPLASTIC POLYP(S) - NO HIGH-GRADE DYSPLASIA OR MALIGNANCY IDENTIFIED 7. Ascending Colon Biopsy, Mass - POORLY DIFFERENTIATED ADENOCARCINOMA - SEE COMMENT   07/30/2021 Imaging   CT CAP IMPRESSION: 1. Masslike region in the ascending colon extending to the level of the hepatic flexure in the right lower quadrant, concerning for malignancy. Associated pericolonic fat stranding and a few prominent lymph nodes are seen adjacent to this segment of colon, suspicious for local infiltration. No distant metastasis is seen. 2. Left inguinal hernia containing nonobstructed colon. 3. Nonspecific small ground-glass opacity in the in the right lower lobe. Attention on follow-up is recommended. 4. Small hiatal hernia. 5. Aortic atherosclerosis and coronary artery calcifications   09/14/2021 Surgery   PROCEDURE:  Laparoscopic converted to open right hemicolectomy Repair of small bowel serosa x 2     09/14/2021 Pathology Results   A. COLON, RIGHT, HEMI COLECTOMY:  Poorly differentiated adenocarcinoma in the ascending colon with clear margins of resection.  Tumor Size:  Circumferential measuring 9.0 cm X 8.0 cm. X 2.5 cm.  Macroscopic Tumor Perforation: Not  identified  Histologic Type: Adenocarcinoma.  Histologic Grade: Grade 3, poorly differentiated.  Multiple Primary Sites: Not applicable  Tumor Extension: Extends through the muscular wall into the pericolonic adipose tissue.  Lymphovascular Invasion: Not identified.  Perineural Invasion: Not identified.  Treatment Effect: No known presurgical therapy.  Margins:       Margin Status for Invasive Carcinoma: All margins are negative for invasive carcinoma       Distance from Invasive Carcinoma to proximal margin: 11 cm.       Distance from invasive carcinoma to distal margin: 12 cm.  Regional Lymph Nodes:       Number of Lymph Nodes with Tumor: None.       Number of Lymph Nodes Examined: Twenty-two (22)  Tumor Deposits: Present (slide A11)  Distant Metastasis:       Distant Site(s) Involved: None known.  Pathologic Stage Classification (pTNM, AJCC 8th Edition): pT3 pN1c  Comments: Omentum is free of neoplastic invasion.                      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LAMN)    09/14/2021 Cancer Staging   Staging form: Colon and Rectum, AJCC 8th Edition - Pathologic stage from 09/14/2021: Stage IIIB (pT3, pN1c, cM0) - Signed by Alla Feeling, NP on 10/08/2021 Stage prefix: Initial diagnosis Total positive nodes: 0 Histologic grading system: 4 grade system Histologic grade (G): G3 Laterality: Right Lymph-vascular invasion (LVI): LVI not present (absent)/not identified Tumor deposits (TD): Present Perineural invasion (PNI): Absent Microsatellite instability (MSI): Unstable high BRAF mutation: Positive   10/08/2021 Initial Diagnosis   Primary adenocarcinoma of ascending colon ([place])   10/22/2021 - 11/07/2021 Chemotherapy   Patient is on Treatment Plan : COLORECTAL FOLFOX q14d x 3 months     10/22/2021 -  Chemotherapy   Patient is on Treatment Plan : COLORECTAL FOLFOX q14d x 3 months         INTERVAL HISTORY:  Joseph Pennington is here for a follow up of colon cancer. He was last seen by PA Murray Hodgkins on 01/13/22. He presents to the clinic accompanied by his daughter. He reports his last cycle hit him very hard. He explains he developed diarrhea, which persists today and continues to be watery; he has lost weight because of this. He endorses drinking Gatorade and water. He notes his taste is slowly recovering.   All other systems were reviewed with the patient and are negative.  MEDICAL HISTORY:  Past Medical History:  Diagnosis Date   Acute cholecystitis 02/19/2018   Anemia    Chronic kidney disease    Colon cancer ([place])    Diabetes mellitus without complication (HCC)    diet controlled   GERD (gastroesophageal reflux disease)    History of blood transfusion 08/2021   History of hiatal hernia    Hypertension    Hypothyroidism    Iron deficiency anemia    Peripheral neuropathy    bilateral feet    SURGICAL HISTORY: Past Surgical History:  Procedure Laterality Date   CHOLECYSTECTOMY N/A 02/20/2018   Procedure: LAPAROSCOPIC CHOLECYSTECTOMY;  Surgeon: Excell Seltzer, MD;  Location: [facility];  Service: General;  Laterality: N/A;   COLONOSCOPY  2023   LAPAROSCOPIC RIGHT HEMI COLECTOMY Right 09/14/2021   Procedure: LAPAROSCOPIC TO OPEN RIGHT HEMI COLECTOMY;  Surgeon: Ileana Roup, MD;  Location: [facility];  Service: General;  Laterality: Right;   PORTACATH PLACEMENT N/A 10/15/2021   Procedure: INSERTION PORT-A-CATH;  Surgeon: Ileana Roup,  MD;  Location: [facility];  Service: General;  Laterality: N/A;  with ultrasound and flouroscopic guidance    I have reviewed the social history and family history with the patient and they are unchanged from previous note.  ALLERGIES:  has No Known Allergies.  MEDICATIONS:  Current Outpatient Medications  Medication Sig Dispense Refill   acetaminophen (TYLENOL) 500 MG tablet Take 500-1,000 mg by mouth every 6 (six)  hours as needed for moderate pain.     ARTIFICIAL TEAR SOLUTION OP Place 1 drop into both eyes daily as needed (dry eyes).     capecitabine (XELODA) 500 MG tablet Take 3 tablets (1500 mg) by mouth every 12 hours. Take within 30 minutes after meals. Take only on days of radiation, Monday through Friday. 90 tablet 1   cyanocobalamin (,VITAMIN B-12,) 1000 MCG/ML injection Inject 1,000 mcg into the muscle every 30 (thirty) days.   0   Iron, Ferrous Sulfate, 325 (65 Fe) MG TABS Take 325 mg by mouth daily.     levothyroxine (SYNTHROID) 75 MCG tablet Take 75 mcg by mouth daily before breakfast.     lisinopril (PRINIVIL,ZESTRIL) 20 MG tablet Take 20 mg by mouth daily.  3   ondansetron (ZOFRAN) 8 MG tablet Take 1 tablet (8 mg total) by mouth every 8 (eight) hours as needed for nausea or vomiting. 90 tablet 0   polyethylene glycol powder (GLYCOLAX/MIRALAX) 17 GM/SCOOP powder Take 17 g by mouth as needed for mild constipation. (Patient not taking: Reported on 01/07/2022)     potassium chloride SA (KLOR-CON M) 20 MEQ tablet One po bid x 3 days, then one po once a day 20 tablet 0   vitamin C (ASCORBIC ACID) 500 MG tablet Take 500 mg by mouth daily.     No current facility-administered medications for this visit.    PHYSICAL EXAMINATION: ECOG PERFORMANCE STATUS: 1 - Symptomatic but completely ambulatory  Vitals:   01/29/22 0906  BP: 127/66  Pulse: 88  Resp: 19  Temp: 97.7 F (36.5 C)  SpO2: 100%   Wt Readings from Last 3 Encounters:  01/29/22 200 lb 9.6 oz (91 kg)  01/13/22 209 lb 3.2 oz (94.9 kg)  01/07/22 208 lb 6.4 oz (94.5 kg)     GENERAL:alert, no distress and comfortable SKIN: skin color normal, no rashes or significant lesions EYES: normal, Conjunctiva are pink and non-injected, sclera clear  NEURO: alert & oriented x 3 with fluent speech  LABORATORY DATA:  I have reviewed the data as listed    Latest Ref Rng & Units 01/29/2022    8:56 AM 01/21/2022   11:03 AM 01/13/2022    8:20  AM  CBC  WBC 4.0 - 10.5 K/uL 3.4  4.4  3.1   Hemoglobin 13.0 - 17.0 g/dL 10.9  11.4  11.1   Hematocrit 39.0 - 52.0 % 30.9  31.5  31.0   Platelets 150 - 400 K/uL 154  159  100         Latest Ref Rng & Units 01/29/2022    8:56 AM 01/21/2022    1:18 PM 01/13/2022    8:20 AM  CMP  Glucose 70 - 99 mg/dL 150  145  189   BUN 8 - 23 mg/dL _0 Creatinine 0.61 - 1.24 mg/dL 1.32  1.25  1.04   Sodium 135 - 145 mmol/L 135  137  136   Potassium 3.5 - 5.1 mmol/L 4.7  3.1  4.1   Chloride  98 - 111 mmol/L 105  111  102   CO2 22 - 32 mmol/L _0 Calcium 8.9 - 10.3 mg/dL 9.5  7.9  9.7   Total Protein 6.5 - 8.1 g/dL 7.0  6.0  6.6   Total Bilirubin 0.3 - 1.2 mg/dL 1.0  1.2  0.9   Alkaline Phos 38 - 126 U/L 87  72  105   AST 15 - 41 U/L _1 ALT 0 - 44 U/L _2 RADIOGRAPHIC STUDIES: I have personally reviewed the radiological images as listed and agreed with the findings in the report. No results found.    No orders of the defined types were placed in this encounter.  All questions were answered. The patient knows to call the clinic with any problems, questions or concerns. No barriers to learning was detected. The total time spent in the appointment was 30 minutes.     Truitt Merle, MD 01/29/2022   I, Wilburn Mylar, am acting as scribe for Truitt Merle, MD.   I have reviewed the above documentation for accuracy and completeness, and I agree with the above.

## 2022-01-31 ENCOUNTER — Other Ambulatory Visit: Payer: Self-pay

## 2022-02-01 ENCOUNTER — Ambulatory Visit: Payer: Medicare Other | Admitting: Radiation Oncology

## 2022-02-01 ENCOUNTER — Telehealth: Payer: Self-pay

## 2022-02-01 ENCOUNTER — Telehealth: Payer: Self-pay | Admitting: Hematology

## 2022-02-01 NOTE — Telephone Encounter (Signed)
-----   Message from Truitt Merle, MD sent at 01/30/2022  9:35 AM EST ----- Please let pt or his daughter know his Cr is still slightly elevated due to diarrhea and dehydration, encourage him to drink more liquid especially sports drinks or liquid IV. Thanks  Truitt Merle

## 2022-02-01 NOTE — Telephone Encounter (Signed)
Notified patients daughter of results below

## 2022-02-01 NOTE — Telephone Encounter (Signed)
Left patient a voicemail regarding upcoming appointments  

## 2022-02-02 ENCOUNTER — Ambulatory Visit: Payer: Medicare Other

## 2022-02-03 ENCOUNTER — Ambulatory Visit: Payer: Medicare Other

## 2022-02-03 ENCOUNTER — Other Ambulatory Visit: Payer: Self-pay

## 2022-02-03 ENCOUNTER — Telehealth: Payer: Self-pay

## 2022-02-03 DIAGNOSIS — C182 Malignant neoplasm of ascending colon: Secondary | ICD-10-CM | POA: Diagnosis not present

## 2022-02-03 DIAGNOSIS — Z510 Encounter for antineoplastic radiation therapy: Secondary | ICD-10-CM | POA: Diagnosis not present

## 2022-02-03 NOTE — Telephone Encounter (Signed)
        Patient  visited [facility] on 01/21/2022  for Dehydration, Acute diarrhea.   Telephone encounter attempt :  1st  A HIPAA compliant voice message was left requesting a return call.  Instructed patient to call back at [phone].   [place] Resource Care Guide   ??[email or web address]  ?? [phone]   Website: [email or web address]  [email or web address]

## 2022-02-04 ENCOUNTER — Telehealth: Payer: Self-pay

## 2022-02-04 ENCOUNTER — Ambulatory Visit: Payer: Medicare Other

## 2022-02-04 NOTE — Telephone Encounter (Signed)
        Patient  visited [facility] on 01/21/2022  for Dehydration, Acute diarrhea.   Telephone encounter attempt :  2nd  A HIPAA compliant voice message was left requesting a return call.  Instructed patient to call back at [phone].   [place] Resource Care Guide   [email or web address]  ?? [phone]   Website: [email or web address]  [email or web address]

## 2022-02-05 ENCOUNTER — Telehealth: Payer: Self-pay

## 2022-02-05 ENCOUNTER — Ambulatory Visit: Payer: Medicare Other

## 2022-02-05 NOTE — Telephone Encounter (Signed)
        Patient  visited [facility] on 01/21/2022  for Dehydration, Acute diarrhea.   Telephone encounter attempt :  3rd  A HIPAA compliant voice message was left requesting a return call.  Instructed patient to call back at [phone].   [place] Resource Care Guide   [email or web address]  ?? [phone]   Website: [email or web address]  [email or web address]

## 2022-02-08 ENCOUNTER — Ambulatory Visit: Payer: Medicare Other

## 2022-02-09 ENCOUNTER — Ambulatory Visit: Payer: Medicare Other

## 2022-02-09 ENCOUNTER — Other Ambulatory Visit (HOSPITAL_COMMUNITY): Payer: Self-pay

## 2022-02-10 ENCOUNTER — Ambulatory Visit: Payer: Medicare Other

## 2022-02-15 ENCOUNTER — Other Ambulatory Visit: Payer: Self-pay

## 2022-02-15 ENCOUNTER — Ambulatory Visit
Admission: RE | Admit: 2022-02-15 | Discharge: 2022-02-15 | Disposition: A | Discharge status: Home / Self Care | Payer: Medicare Other | Source: Ambulatory Visit | Attending: Radiation Oncology | Admitting: Radiation Oncology

## 2022-02-15 DIAGNOSIS — C182 Malignant neoplasm of ascending colon: Secondary | ICD-10-CM | POA: Diagnosis not present

## 2022-02-15 DIAGNOSIS — Z510 Encounter for antineoplastic radiation therapy: Secondary | ICD-10-CM | POA: Diagnosis not present

## 2022-02-15 LAB — RAD ONC ARIA SESSION SUMMARY
Course Elapsed Days: 0
Plan Fractions Treated to Date: 1
Plan Prescribed Dose Per Fraction: 1.8 Gy
Plan Total Fractions Prescribed: 25
Plan Total Prescribed Dose: 45 Gy
Reference Point Dosage Given to Date: 1.8 Gy
Reference Point Session Dosage Given: 1.8 Gy
Session Number: 1

## 2022-02-16 ENCOUNTER — Other Ambulatory Visit: Payer: Self-pay

## 2022-02-16 ENCOUNTER — Inpatient Hospital Stay (HOSPITAL_BASED_OUTPATIENT_CLINIC_OR_DEPARTMENT_OTHER): Payer: Medicare Other | Admitting: Hematology

## 2022-02-16 ENCOUNTER — Inpatient Hospital Stay: Payer: Medicare Other

## 2022-02-16 ENCOUNTER — Ambulatory Visit
Admission: RE | Admit: 2022-02-16 | Discharge: 2022-02-16 | Disposition: A | Discharge status: Home / Self Care | Payer: Medicare Other | Source: Ambulatory Visit | Attending: Radiation Oncology | Admitting: Radiation Oncology

## 2022-02-16 VITALS — BP 117/74 | HR 86 | Temp 97.8°F | Resp 18 | Ht 73.0 in | Wt 204.7 lb

## 2022-02-16 DIAGNOSIS — E860 Dehydration: Secondary | ICD-10-CM

## 2022-02-16 DIAGNOSIS — C182 Malignant neoplasm of ascending colon: Secondary | ICD-10-CM

## 2022-02-16 DIAGNOSIS — Z510 Encounter for antineoplastic radiation therapy: Secondary | ICD-10-CM | POA: Diagnosis not present

## 2022-02-16 DIAGNOSIS — D500 Iron deficiency anemia secondary to blood loss (chronic): Secondary | ICD-10-CM

## 2022-02-16 LAB — RAD ONC ARIA SESSION SUMMARY
Course Elapsed Days: 1
Plan Fractions Treated to Date: 2
Plan Prescribed Dose Per Fraction: 1.8 Gy
Plan Total Fractions Prescribed: 25
Plan Total Prescribed Dose: 45 Gy
Reference Point Dosage Given to Date: 3.6 Gy
Reference Point Session Dosage Given: 1.8 Gy
Session Number: 2

## 2022-02-16 LAB — CBC WITH DIFFERENTIAL (CANCER CENTER ONLY)
Abs Immature Granulocytes: 0.02 10*3/uL (ref 0.00–0.07)
Basophils Absolute: 0 10*3/uL (ref 0.0–0.1)
Basophils Relative: 1 %
Eosinophils Absolute: 0.1 10*3/uL (ref 0.0–0.5)
Eosinophils Relative: 3 %
HCT: 28.1 % — ABNORMAL LOW (ref 39.0–52.0)
Hemoglobin: 9.9 g/dL — ABNORMAL LOW (ref 13.0–17.0)
Immature Granulocytes: 0 %
Lymphocytes Relative: 14 %
Lymphs Abs: 0.6 10*3/uL — ABNORMAL LOW (ref 0.7–4.0)
MCH: 35.6 pg — ABNORMAL HIGH (ref 26.0–34.0)
MCHC: 35.2 g/dL (ref 30.0–36.0)
MCV: 101.1 fL — ABNORMAL HIGH (ref 80.0–100.0)
Monocytes Absolute: 0.4 10*3/uL (ref 0.1–1.0)
Monocytes Relative: 9 %
Neutro Abs: 3.4 10*3/uL (ref 1.7–7.7)
Neutrophils Relative %: 73 %
Platelet Count: 146 10*3/uL — ABNORMAL LOW (ref 150–400)
RBC: 2.78 MIL/uL — ABNORMAL LOW (ref 4.22–5.81)
RDW: 14.1 % (ref 11.5–15.5)
WBC Count: 4.6 10*3/uL (ref 4.0–10.5)
nRBC: 0 % (ref 0.0–0.2)

## 2022-02-16 LAB — IRON AND IRON BINDING CAPACITY (CC-WL,HP ONLY)
Iron: 63 ug/dL (ref 45–182)
Saturation Ratios: 24 % (ref 17.9–39.5)
TIBC: 265 ug/dL (ref 250–450)
UIBC: 202 ug/dL (ref 117–376)

## 2022-02-16 LAB — CMP (CANCER CENTER ONLY)
ALT: 7 U/L (ref 0–44)
AST: 12 U/L — ABNORMAL LOW (ref 15–41)
Albumin: 3.5 g/dL (ref 3.5–5.0)
Alkaline Phosphatase: 91 U/L (ref 38–126)
Anion gap: 6 (ref 5–15)
BUN: 17 mg/dL (ref 8–23)
CO2: 27 mmol/L (ref 22–32)
Calcium: 9.2 mg/dL (ref 8.9–10.3)
Chloride: 106 mmol/L (ref 98–111)
Creatinine: 0.98 mg/dL (ref 0.61–1.24)
GFR, Estimated: >60 mL/min (ref >60)
Glucose, Bld: 175 mg/dL — ABNORMAL HIGH (ref 70–99)
Potassium: 3.5 mmol/L (ref 3.5–5.1)
Sodium: 139 mmol/L (ref 135–145)
Total Bilirubin: 0.7 mg/dL (ref 0.3–1.2)
Total Protein: 6.9 g/dL (ref 6.5–8.1)

## 2022-02-16 LAB — CEA (IN HOUSE-CHCC): CEA (CHCC-In House): 3.27 ng/mL (ref 0.00–5.00)

## 2022-02-16 LAB — FERRITIN: Ferritin: 167 ng/mL (ref 24–336)

## 2022-02-16 MED ORDER — SODIUM CHLORIDE 0.9% FLUSH
10.0000 mL | Freq: Once | INTRAVENOUS | Status: AC
  Administered 2022-02-16: 10 mL

## 2022-02-16 MED ORDER — HEPARIN SOD (PORK) LOCK FLUSH 100 UNIT/ML IV SOLN
500.0000 [IU] | Freq: Once | INTRAVENOUS | Status: AC
  Administered 2022-02-16: 500 [IU]

## 2022-02-16 NOTE — Progress Notes (Unsigned)
[place]   Telephone:([phone]) [phone] [phone]   Clinic Follow up Note   Patient Care Team: Shirline Frees, MD as PCP - General (Family Medicine) Truitt Merle, MD as Consulting Physician (Hematology and Oncology)  Date of Service:  02/16/2022  CHIEF COMPLAINT: f/u of colon cancer   CURRENT THERAPY:   Adjuvant FOLFOX, q14d, starting 10/22/21     ASSESSMENT:  Joseph Pennington is a 88 y.o. male with    1. 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 Staging CT negative for distant mets. Baseline CEA was WNL. -s/p open right colectomy by Dr. Dema Severin 09/14/21, path showed 9 cm adenocarcinoma extending through muscular wall, lymph nodes negative. Loss of MLH1 and PMS2 with BRAF mutation and MLH1 hyper methylation. -he completed 6 cycles adjuvant FOLFOX 10/22/21 - 01/13/22. He tolerated moderately well with progressive side effects-- fatigue/weakness, taste changes/loss, nausea, and diarrhea. He is still experiencing watery diarrhea and has lost weight. -he is scheduled to start concurrent chemoRT with Xeloda on Monday, 11/13. Given his continued side effects, I recommend delaying until after Thanksgiving. I will reach out to Dr. Lisbeth Renshaw. -labs reviewed, CMP actually improved except creatinine 1.32. CBC overall stable with persistent anemia, hgb 10.9. -We reviewed the potential side effect from Xeloda, especially skin toxicity, and the management, he voiced good understanding and agrees to proceed.  We will start on first day of radiation   2. Diarrhea -secondary to chemo, worsened with each cycle -was evaluated at ED, improved overall but still persists today and continues to be watery. -will obtain stool test today, order was previously placed but never completed.   3. IDA  secondary to blood loss from #1, low-normal range B12 -hgb slowly started dropping from 02/2018 -he began oral B12 and oral iron in 05/2021.  -He received IV Feraheme, 1 unit of blood, and vit B12 while hospitalized for surgery -he was given IV Feraheme with first two cycles FOLFOX.    No problem-specific Assessment & Plan notes found for this encounter.     PLAN: -Labs are stable -kIdney and liver function pending -lab qweek f/u every 2wks    SUMMARY OF ONCOLOGIC HISTORY: Oncology History  Ascending colon cancer s/p lap colectomy 09/14/2021  07/23/2021 Procedure   Upper GI Endoscopy/Colonoscopy; Dr. Lorenso Courier  Colonoscopy Impression: - Likely malignant partially obstructing tumor in the ascending colon. Biopsied. - Six 3 to 8 mm polyps in the rectum and in the transverse colon, removed with a cold snare. Resected and retrieved. - Non-bleeding internal hemorrhoids.  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 Biopsied. - Multiple gastric polyps. Resected and retrieved. - Erythematous mucosa in the antrum. Biopsied. - Mucosal nodule found in the duodenum. Biopsied. - Dilated lacteals were found in the duodenum. Biopsied. - Two non-bleeding angioectasias in the duodenum.   07/23/2021 Initial Biopsy   Diagnosis 1. Duodenum, Biopsy - BENIGN DUODENAL MUCOSA - NO ACUTE INFLAMMATION, VILLOUS BLUNTING OR INCREASED INTRAEPITHELIAL LYMPHOCYTES IDENTIFIED 2. Duodenum, Biopsy, Nodule - PEPTIC DUODENITIS - NO DYSPLASIA OR MALIGNANCY IDENTIFIED 3. Stomach, biopsy - MILD CHRONIC GASTRITIS WITHOUT ACTIVITY - NO INTESTINAL METAPLASIA IDENTIFIED - SEE COMMENT 4. Stomach, polyp(s) - HYPERPLASTIC GASTRIC POLYP(S) WITH INTESTINAL METAPLASIA - NO H. PYLORI OR MALIGNANCY IDENTIFIED 5. Esophagogastric junction, biopsy - SQUAMOCOLUMNAR JUNCTION WITH CHRONIC INFLAMMATION - NO INTESTINAL  METAPLASIA, DYSPLASIA OR MALIGNANCY IDENTIFIED 6.  Rectum, biopsy, and transverse - MULTIPLE FRAGMENTS OF HYPERPLASTIC POLYP(S) - NO HIGH-GRADE DYSPLASIA OR MALIGNANCY IDENTIFIED 7. Ascending Colon Biopsy, Mass - POORLY DIFFERENTIATED ADENOCARCINOMA - SEE COMMENT  Microscopic Comment 7. By immunohistochemistry, the neoplastic cells are positive for CDX2 (patchy, weak) but negative for p63, p40, CD56, chromogranin and synaptophysin. This immunoprofile is consistent with a poorly differentiated adenocarcinoma.  3. H. pylori immunohistochemistry is POSITIVE for RARE microorganisms.   07/30/2021 Imaging   EXAM: CT CHEST, ABDOMEN, AND PELVIS WITH CONTRAST  IMPRESSION: 1. Masslike region in the ascending colon extending to the level of the hepatic flexure in the right lower quadrant, concerning for malignancy. Associated pericolonic fat stranding and a few prominent lymph nodes are seen adjacent to this segment of colon, suspicious for local infiltration. No distant metastasis is seen. 2. Left inguinal hernia containing nonobstructed colon. 3. Nonspecific small ground-glass opacity in the in the right lower lobe. Attention on follow-up is recommended. 4. Small hiatal hernia. 5. Aortic atherosclerosis and coronary artery calcifications   08/06/2021 Initial Diagnosis   Malignant neoplasm of ascending colon [facility])   Primary adenocarcinoma of ascending colon ([place])  07/23/2021 Procedure   Colonoscopy by Dr. Lorenso Courier - A frond-like/villous, fungating and ulcerated partially obstructing large mass was found in the ascending colon. There was a point at which the mass could not be bypassed, even with use of an ultraslim colonoscope. It is suspected that this mass encompasses the entire cecum and ascending colon. The mass was circumferential. The mass measured fifteen cm in length. Oozing was present.    07/23/2021 Initial Biopsy   3. H. pylori immunohistochemistry is POSITIVE for RARE microorganisms.  FINAL DIAGNOSIS Diagnosis 1. Duodenum,  Biopsy - BENIGN DUODENAL MUCOSA - NO ACUTE INFLAMMATION, VILLOUS BLUNTING OR INCREASED INTRAEPITHELIAL LYMPHOCYTES IDENTIFIED 2. Duodenum, Biopsy, Nodule - PEPTIC DUODENITIS - NO DYSPLASIA OR MALIGNANCY IDENTIFIED 3. Stomach, biopsy - MILD CHRONIC GASTRITIS WITHOUT ACTIVITY - NO INTESTINAL METAPLASIA IDENTIFIED - SEE COMMENT 4. Stomach, polyp(s) - HYPERPLASTIC GASTRIC POLYP(S) WITH INTESTINAL METAPLASIA - NO H. PYLORI OR MALIGNANCY IDENTIFIED 5. Esophagogastric junction, biopsy - SQUAMOCOLUMNAR JUNCTION WITH CHRONIC INFLAMMATION - NO INTESTINAL METAPLASIA, DYSPLASIA OR MALIGNANCY IDENTIFIED 6. Rectum, biopsy, and transverse - MULTIPLE FRAGMENTS OF HYPERPLASTIC POLYP(S) - NO HIGH-GRADE DYSPLASIA OR MALIGNANCY IDENTIFIED 7. Ascending Colon Biopsy, Mass - POORLY DIFFERENTIATED ADENOCARCINOMA - SEE COMMENT   07/30/2021 Imaging   CT CAP IMPRESSION: 1. Masslike region in the ascending colon extending to the level of the hepatic flexure in the right lower quadrant, concerning for malignancy. Associated pericolonic fat stranding and a few prominent lymph nodes are seen adjacent to this segment of colon, suspicious for local infiltration. No distant metastasis is seen. 2. Left inguinal hernia containing nonobstructed colon. 3. Nonspecific small ground-glass opacity in the in the right lower lobe. Attention on follow-up is recommended. 4. Small hiatal hernia. 5. Aortic atherosclerosis and coronary artery calcifications   09/14/2021 Surgery   PROCEDURE:  Laparoscopic converted to open right hemicolectomy Repair of small bowel serosa x 2     09/14/2021 Pathology Results   A. COLON, RIGHT, HEMI COLECTOMY:  Poorly differentiated adenocarcinoma in the ascending colon with clear margins of resection.  Tumor Size: Circumferential measuring 9.0 cm X 8.0 cm. X 2.5 cm.  Macroscopic Tumor Perforation: Not identified  Histologic Type: Adenocarcinoma.  Histologic Grade: Grade 3, poorly  differentiated.  Multiple Primary Sites: Not applicable  Tumor Extension: Extends through the muscular wall into the pericolonic adipose tissue.  Lymphovascular Invasion: Not identified.  Perineural Invasion: Not identified.  Treatment Effect: No known presurgical therapy.  Margins:       Margin Status for Invasive Carcinoma: All margins are negative for invasive carcinoma       Distance from Invasive Carcinoma to proximal margin: 11 cm.       Distance from invasive carcinoma to distal margin: 12 cm.  Regional Lymph Nodes:       Number of Lymph Nodes with Tumor: None.       Number of Lymph Nodes Examined: Twenty-two (22)  Tumor Deposits: Present (slide A11)  Distant Metastasis:       Distant Site(s) Involved: None known.  Pathologic Stage Classification (pTNM, AJCC 8th Edition): pT3 pN1c  Comments: Omentum is free of neoplastic invasion.                      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LAMN)    09/14/2021 Cancer Staging   Staging form: Colon and Rectum, AJCC 8th Edition - Pathologic stage from 09/14/2021: Stage IIIB (pT3, pN1c, cM0) - Signed by Alla Feeling, NP on 10/08/2021 Stage prefix: Initial diagnosis Total positive nodes: 0 Histologic grading system: 4 grade system Histologic grade (G): G3 Laterality: Right Lymph-vascular invasion (LVI): LVI not present (absent)/not identified Tumor deposits (TD): Present Perineural invasion (PNI): Absent Microsatellite instability (MSI): Unstable high BRAF mutation: Positive   10/08/2021 Initial Diagnosis   Primary adenocarcinoma of ascending colon ([place])   10/22/2021 - 11/07/2021 Chemotherapy   Patient is on Treatment Plan : COLORECTAL FOLFOX q14d x 3 months     10/22/2021 -  Chemotherapy   Patient is on Treatment Plan : COLORECTAL FOLFOX q14d x 3 months        INTERVAL HISTORY:  Joseph Pennington is here for a follow up of  colon cancer He was last seen by me on 01/29/2022 He presents to the clinic  with son. Pt states he  has pain in stomach. Experience diarrhea before Thanksgiving.pt states he took Imodium. Pt started the Chemo Pill 02/15/2022.take three in the AM and three in the PM.Pt states he use to walk a mile and a half.  All other systems were reviewed with the patient and are negative.  MEDICAL HISTORY:  Past Medical History:  Diagnosis Date   Acute cholecystitis 02/19/2018   Anemia    Chronic kidney disease    Colon cancer ([place])    Diabetes mellitus without complication (HCC)    diet controlled   GERD (gastroesophageal reflux disease)    History of blood transfusion 08/2021   History of hiatal hernia    Hypertension    Hypothyroidism    Iron deficiency anemia    Peripheral neuropathy    bilateral feet    SURGICAL HISTORY: Past Surgical History:  Procedure Laterality Date   CHOLECYSTECTOMY N/A 02/20/2018   Procedure: LAPAROSCOPIC CHOLECYSTECTOMY;  Surgeon: Excell Seltzer, MD;  Location: [facility];  Service: General;  Laterality: N/A;   COLONOSCOPY  2023   LAPAROSCOPIC RIGHT HEMI COLECTOMY Right 09/14/2021   Procedure: LAPAROSCOPIC TO OPEN RIGHT HEMI COLECTOMY;  Surgeon: Ileana Roup, MD;  Location: [facility];  Service: General;  Laterality: Right;   PORTACATH PLACEMENT N/A 10/15/2021   Procedure: INSERTION PORT-A-CATH;  Surgeon: Ileana Roup, MD;  Location: [facility];  Service: General;  Laterality: N/A;  with ultrasound and flouroscopic guidance    I have reviewed the social history and family history with the patient and they are unchanged  from previous note.  ALLERGIES:  has No Known Allergies.  MEDICATIONS:  Current Outpatient Medications  Medication Sig Dispense Refill   acetaminophen (TYLENOL) 500 MG tablet Take 500-1,000 mg by mouth every 6 (six) hours as needed for moderate pain.     ARTIFICIAL TEAR SOLUTION OP Place 1 drop into both eyes daily as needed (dry eyes).     capecitabine (XELODA) 500 MG tablet Take 3 tablets (1500 mg) by mouth every 12 hours. Take  within 30 minutes after meals. Take only on days of radiation, Monday through Friday. 90 tablet 1   cyanocobalamin (,VITAMIN B-12,) 1000 MCG/ML injection Inject 1,000 mcg into the muscle every 30 (thirty) days.   0   Iron, Ferrous Sulfate, 325 (65 Fe) MG TABS Take 325 mg by mouth daily.     levothyroxine (SYNTHROID) 75 MCG tablet Take 75 mcg by mouth daily before breakfast.     lisinopril (PRINIVIL,ZESTRIL) 20 MG tablet Take 20 mg by mouth daily.  3   ondansetron (ZOFRAN) 8 MG tablet Take 1 tablet (8 mg total) by mouth every 8 (eight) hours as needed for nausea or vomiting. 90 tablet 0   polyethylene glycol powder (GLYCOLAX/MIRALAX) 17 GM/SCOOP powder Take 17 g by mouth as needed for mild constipation. (Patient not taking: Reported on 01/07/2022)     potassium chloride SA (KLOR-CON M) 20 MEQ tablet One po bid x 3 days, then one po once a day 20 tablet 0   vitamin C (ASCORBIC ACID) 500 MG tablet Take 500 mg by mouth daily.     No current facility-administered medications for this visit.    PHYSICAL EXAMINATION: ECOG PERFORMANCE STATUS: {CHL ONC ECOG NA:[id number]}  Vitals:   02/16/22 1349  BP: 117/74  Pulse: 86  Resp: 18  Temp: 97.8 F (36.6 C)  SpO2: 100%   Wt Readings from Last 3 Encounters:  02/16/22 204 lb 11.2 oz (92.9 kg)  01/29/22 200 lb 9.6 oz (91 kg)  01/13/22 209 lb 3.2 oz (94.9 kg)     GENERAL:alert, no distress and comfortable SKIN: skin color normal, no rashes or significant lesions EYES: normal, Conjunctiva are pink and non-injected, sclera clear  NEURO: alert & oriented x 3 with fluent speech   LABORATORY DATA:  I have reviewed the data as listed    Latest Ref Rng & Units 02/16/2022    1:29 PM 01/29/2022    8:56 AM 01/21/2022   11:03 AM  CBC  WBC 4.0 - 10.5 K/uL 4.6  3.4  4.4   Hemoglobin 13.0 - 17.0 g/dL 9.9  10.9  11.4   Hematocrit 39.0 - 52.0 % 28.1  30.9  31.5   Platelets 150 - 400 K/uL 146  154  159         Latest Ref Rng & Units 01/29/2022     8:56 AM 01/21/2022    1:18 PM 01/13/2022    8:20 AM  CMP  Glucose 70 - 99 mg/dL 150  145  189   BUN 8 - 23 mg/dL _0 Creatinine 0.61 - 1.24 mg/dL 1.32  1.25  1.04   Sodium 135 - 145 mmol/L 135  137  136   Potassium 3.5 - 5.1 mmol/L 4.7  3.1  4.1   Chloride 98 - 111 mmol/L 105  111  102   CO2 22 - 32 mmol/L _1 Calcium 8.9 - 10.3 mg/dL 9.5  7.9  9.7   Total Protein  6.5 - 8.1 g/dL 7.0  6.0  6.6   Total Bilirubin 0.3 - 1.2 mg/dL 1.0  1.2  0.9   Alkaline Phos 38 - 126 U/L 87  72  105   AST 15 - 41 U/L _0 ALT 0 - 44 U/L _1 RADIOGRAPHIC STUDIES: I have personally reviewed the radiological images as listed and agreed with the findings in the report. No results found.    No orders of the defined types were placed in this encounter.  All questions were answered. The patient knows to call the clinic with any problems, questions or concerns. No barriers to learning was detected. The total time spent in the appointment was {CHL ONC TIME VISIT - [id number]}.     Baldemar Friday, CMA 02/16/2022   I, Audry Riles, CMA, am acting as scribe for Truitt Merle, MD.   {Add scribe attestation statement}

## 2022-02-17 ENCOUNTER — Other Ambulatory Visit: Payer: Self-pay

## 2022-02-17 ENCOUNTER — Encounter: Payer: Self-pay | Admitting: Nurse Practitioner

## 2022-02-17 ENCOUNTER — Encounter: Payer: Self-pay | Admitting: Hematology

## 2022-02-17 ENCOUNTER — Ambulatory Visit
Admission: RE | Admit: 2022-02-17 | Discharge: 2022-02-17 | Disposition: A | Discharge status: Home / Self Care | Payer: Medicare Other | Source: Ambulatory Visit | Attending: Radiation Oncology | Admitting: Radiation Oncology

## 2022-02-17 DIAGNOSIS — C182 Malignant neoplasm of ascending colon: Secondary | ICD-10-CM | POA: Diagnosis not present

## 2022-02-17 DIAGNOSIS — Z510 Encounter for antineoplastic radiation therapy: Secondary | ICD-10-CM | POA: Diagnosis not present

## 2022-02-17 LAB — RAD ONC ARIA SESSION SUMMARY
Course Elapsed Days: 2
Plan Fractions Treated to Date: 3
Plan Prescribed Dose Per Fraction: 1.8 Gy
Plan Total Fractions Prescribed: 25
Plan Total Prescribed Dose: 45 Gy
Reference Point Dosage Given to Date: 5.4 Gy
Reference Point Session Dosage Given: 1.8 Gy
Session Number: 3

## 2022-02-18 ENCOUNTER — Ambulatory Visit
Admission: RE | Admit: 2022-02-18 | Discharge: 2022-02-18 | Disposition: A | Discharge status: Home / Self Care | Payer: Medicare Other | Source: Ambulatory Visit | Attending: Radiation Oncology | Admitting: Radiation Oncology

## 2022-02-18 ENCOUNTER — Other Ambulatory Visit: Payer: Self-pay

## 2022-02-18 DIAGNOSIS — C182 Malignant neoplasm of ascending colon: Secondary | ICD-10-CM | POA: Diagnosis not present

## 2022-02-18 DIAGNOSIS — Z510 Encounter for antineoplastic radiation therapy: Secondary | ICD-10-CM | POA: Diagnosis not present

## 2022-02-18 LAB — RAD ONC ARIA SESSION SUMMARY
Course Elapsed Days: 3
Plan Fractions Treated to Date: 4
Plan Prescribed Dose Per Fraction: 1.8 Gy
Plan Total Fractions Prescribed: 25
Plan Total Prescribed Dose: 45 Gy
Reference Point Dosage Given to Date: 7.2 Gy
Reference Point Session Dosage Given: 1.8 Gy
Session Number: 4

## 2022-02-19 ENCOUNTER — Ambulatory Visit
Admission: RE | Admit: 2022-02-19 | Discharge: 2022-02-19 | Disposition: A | Discharge status: Home / Self Care | Payer: Medicare Other | Source: Ambulatory Visit | Attending: Radiation Oncology | Admitting: Radiation Oncology

## 2022-02-19 ENCOUNTER — Other Ambulatory Visit: Payer: Self-pay

## 2022-02-19 DIAGNOSIS — D500 Iron deficiency anemia secondary to blood loss (chronic): Secondary | ICD-10-CM | POA: Insufficient documentation

## 2022-02-19 DIAGNOSIS — E860 Dehydration: Secondary | ICD-10-CM | POA: Insufficient documentation

## 2022-02-19 DIAGNOSIS — C182 Malignant neoplasm of ascending colon: Secondary | ICD-10-CM | POA: Insufficient documentation

## 2022-02-19 DIAGNOSIS — Z510 Encounter for antineoplastic radiation therapy: Secondary | ICD-10-CM | POA: Diagnosis not present

## 2022-02-19 LAB — RAD ONC ARIA SESSION SUMMARY
Course Elapsed Days: 4
Plan Fractions Treated to Date: 5
Plan Prescribed Dose Per Fraction: 1.8 Gy
Plan Total Fractions Prescribed: 25
Plan Total Prescribed Dose: 45 Gy
Reference Point Dosage Given to Date: 9 Gy
Reference Point Session Dosage Given: 1.8 Gy
Session Number: 5

## 2022-02-19 MED ORDER — SONAFINE EX EMUL
1.0000 | Freq: Once | CUTANEOUS | Status: AC
  Administered 2022-02-19: 1 via TOPICAL

## 2022-02-22 ENCOUNTER — Ambulatory Visit
Admission: RE | Admit: 2022-02-22 | Discharge: 2022-02-22 | Disposition: A | Discharge status: Home / Self Care | Payer: Medicare Other | Source: Ambulatory Visit | Attending: Radiation Oncology | Admitting: Radiation Oncology

## 2022-02-22 ENCOUNTER — Other Ambulatory Visit: Payer: Self-pay

## 2022-02-22 DIAGNOSIS — C182 Malignant neoplasm of ascending colon: Secondary | ICD-10-CM | POA: Diagnosis not present

## 2022-02-22 DIAGNOSIS — Z510 Encounter for antineoplastic radiation therapy: Secondary | ICD-10-CM | POA: Diagnosis not present

## 2022-02-22 DIAGNOSIS — E860 Dehydration: Secondary | ICD-10-CM | POA: Diagnosis not present

## 2022-02-22 DIAGNOSIS — D500 Iron deficiency anemia secondary to blood loss (chronic): Secondary | ICD-10-CM | POA: Diagnosis not present

## 2022-02-22 LAB — RAD ONC ARIA SESSION SUMMARY
Course Elapsed Days: 7
Plan Fractions Treated to Date: 6
Plan Prescribed Dose Per Fraction: 1.8 Gy
Plan Total Fractions Prescribed: 25
Plan Total Prescribed Dose: 45 Gy
Reference Point Dosage Given to Date: 10.8 Gy
Reference Point Session Dosage Given: 1.8 Gy
Session Number: 6

## 2022-02-23 ENCOUNTER — Inpatient Hospital Stay: Payer: Medicare Other

## 2022-02-23 ENCOUNTER — Ambulatory Visit
Admission: RE | Admit: 2022-02-23 | Discharge: 2022-02-23 | Disposition: A | Discharge status: Home / Self Care | Payer: Medicare Other | Source: Ambulatory Visit | Attending: Radiation Oncology | Admitting: Radiation Oncology

## 2022-02-23 ENCOUNTER — Other Ambulatory Visit: Payer: Self-pay

## 2022-02-23 DIAGNOSIS — D509 Iron deficiency anemia, unspecified: Secondary | ICD-10-CM | POA: Insufficient documentation

## 2022-02-23 DIAGNOSIS — E860 Dehydration: Secondary | ICD-10-CM | POA: Diagnosis not present

## 2022-02-23 DIAGNOSIS — D500 Iron deficiency anemia secondary to blood loss (chronic): Secondary | ICD-10-CM | POA: Diagnosis not present

## 2022-02-23 DIAGNOSIS — C182 Malignant neoplasm of ascending colon: Secondary | ICD-10-CM

## 2022-02-23 DIAGNOSIS — Z510 Encounter for antineoplastic radiation therapy: Secondary | ICD-10-CM | POA: Diagnosis not present

## 2022-02-23 LAB — RAD ONC ARIA SESSION SUMMARY
Course Elapsed Days: 8
Plan Fractions Treated to Date: 7
Plan Prescribed Dose Per Fraction: 1.8 Gy
Plan Total Fractions Prescribed: 25
Plan Total Prescribed Dose: 45 Gy
Reference Point Dosage Given to Date: 12.6 Gy
Reference Point Session Dosage Given: 1.8 Gy
Session Number: 7

## 2022-02-23 LAB — CBC WITH DIFFERENTIAL (CANCER CENTER ONLY)
Abs Immature Granulocytes: 0.01 10*3/uL (ref 0.00–0.07)
Basophils Absolute: 0 10*3/uL (ref 0.0–0.1)
Basophils Relative: 0 %
Eosinophils Absolute: 0.1 10*3/uL (ref 0.0–0.5)
Eosinophils Relative: 3 %
HCT: 28.6 % — ABNORMAL LOW (ref 39.0–52.0)
Hemoglobin: 9.9 g/dL — ABNORMAL LOW (ref 13.0–17.0)
Immature Granulocytes: 0 %
Lymphocytes Relative: 12 %
Lymphs Abs: 0.4 10*3/uL — ABNORMAL LOW (ref 0.7–4.0)
MCH: 34.9 pg — ABNORMAL HIGH (ref 26.0–34.0)
MCHC: 34.6 g/dL (ref 30.0–36.0)
MCV: 100.7 fL — ABNORMAL HIGH (ref 80.0–100.0)
Monocytes Absolute: 0.3 10*3/uL (ref 0.1–1.0)
Monocytes Relative: 9 %
Neutro Abs: 2.8 10*3/uL (ref 1.7–7.7)
Neutrophils Relative %: 76 %
Platelet Count: 181 10*3/uL (ref 150–400)
RBC: 2.84 MIL/uL — ABNORMAL LOW (ref 4.22–5.81)
RDW: 13.6 % (ref 11.5–15.5)
WBC Count: 3.7 10*3/uL — ABNORMAL LOW (ref 4.0–10.5)
nRBC: 0 % (ref 0.0–0.2)

## 2022-02-23 LAB — CMP (CANCER CENTER ONLY)
ALT: 8 U/L (ref 0–44)
AST: 14 U/L — ABNORMAL LOW (ref 15–41)
Albumin: 3.5 g/dL (ref 3.5–5.0)
Alkaline Phosphatase: 90 U/L (ref 38–126)
Anion gap: 7 (ref 5–15)
BUN: 20 mg/dL (ref 8–23)
CO2: 25 mmol/L (ref 22–32)
Calcium: 9.4 mg/dL (ref 8.9–10.3)
Chloride: 106 mmol/L (ref 98–111)
Creatinine: 0.84 mg/dL (ref 0.61–1.24)
GFR, Estimated: >60 mL/min (ref >60)
Glucose, Bld: 166 mg/dL — ABNORMAL HIGH (ref 70–99)
Potassium: 3.9 mmol/L (ref 3.5–5.1)
Sodium: 138 mmol/L (ref 135–145)
Total Bilirubin: 0.6 mg/dL (ref 0.3–1.2)
Total Protein: 6.8 g/dL (ref 6.5–8.1)

## 2022-02-24 ENCOUNTER — Other Ambulatory Visit: Payer: Self-pay

## 2022-02-24 ENCOUNTER — Ambulatory Visit
Admission: RE | Admit: 2022-02-24 | Discharge: 2022-02-24 | Disposition: A | Discharge status: Home / Self Care | Payer: Medicare Other | Source: Ambulatory Visit | Attending: Radiation Oncology | Admitting: Radiation Oncology

## 2022-02-24 DIAGNOSIS — D500 Iron deficiency anemia secondary to blood loss (chronic): Secondary | ICD-10-CM | POA: Diagnosis not present

## 2022-02-24 DIAGNOSIS — C182 Malignant neoplasm of ascending colon: Secondary | ICD-10-CM | POA: Diagnosis not present

## 2022-02-24 DIAGNOSIS — E860 Dehydration: Secondary | ICD-10-CM | POA: Diagnosis not present

## 2022-02-24 DIAGNOSIS — Z510 Encounter for antineoplastic radiation therapy: Secondary | ICD-10-CM | POA: Diagnosis not present

## 2022-02-24 LAB — RAD ONC ARIA SESSION SUMMARY
Course Elapsed Days: 9
Plan Fractions Treated to Date: 8
Plan Prescribed Dose Per Fraction: 1.8 Gy
Plan Total Fractions Prescribed: 25
Plan Total Prescribed Dose: 45 Gy
Reference Point Dosage Given to Date: 14.4 Gy
Reference Point Session Dosage Given: 1.8 Gy
Session Number: 8

## 2022-02-25 ENCOUNTER — Other Ambulatory Visit: Payer: Self-pay

## 2022-02-25 ENCOUNTER — Other Ambulatory Visit (HOSPITAL_COMMUNITY): Payer: Self-pay

## 2022-02-25 ENCOUNTER — Ambulatory Visit
Admission: RE | Admit: 2022-02-25 | Discharge: 2022-02-25 | Disposition: A | Discharge status: Home / Self Care | Payer: Medicare Other | Source: Ambulatory Visit | Attending: Radiation Oncology | Admitting: Radiation Oncology

## 2022-02-25 DIAGNOSIS — Z510 Encounter for antineoplastic radiation therapy: Secondary | ICD-10-CM | POA: Diagnosis not present

## 2022-02-25 DIAGNOSIS — C182 Malignant neoplasm of ascending colon: Secondary | ICD-10-CM | POA: Diagnosis not present

## 2022-02-25 DIAGNOSIS — D500 Iron deficiency anemia secondary to blood loss (chronic): Secondary | ICD-10-CM | POA: Diagnosis not present

## 2022-02-25 DIAGNOSIS — E860 Dehydration: Secondary | ICD-10-CM | POA: Diagnosis not present

## 2022-02-25 LAB — RAD ONC ARIA SESSION SUMMARY
Course Elapsed Days: 10
Plan Fractions Treated to Date: 9
Plan Prescribed Dose Per Fraction: 1.8 Gy
Plan Total Fractions Prescribed: 25
Plan Total Prescribed Dose: 45 Gy
Reference Point Dosage Given to Date: 16.2 Gy
Reference Point Session Dosage Given: 1.8 Gy
Session Number: 9

## 2022-02-26 ENCOUNTER — Other Ambulatory Visit: Payer: Self-pay

## 2022-02-26 ENCOUNTER — Ambulatory Visit
Admission: RE | Admit: 2022-02-26 | Discharge: 2022-02-26 | Disposition: A | Discharge status: Home / Self Care | Payer: Medicare Other | Source: Ambulatory Visit | Attending: Radiation Oncology | Admitting: Radiation Oncology

## 2022-02-26 DIAGNOSIS — Z510 Encounter for antineoplastic radiation therapy: Secondary | ICD-10-CM | POA: Diagnosis not present

## 2022-02-26 DIAGNOSIS — D500 Iron deficiency anemia secondary to blood loss (chronic): Secondary | ICD-10-CM | POA: Diagnosis not present

## 2022-02-26 DIAGNOSIS — C182 Malignant neoplasm of ascending colon: Secondary | ICD-10-CM | POA: Diagnosis not present

## 2022-02-26 DIAGNOSIS — E860 Dehydration: Secondary | ICD-10-CM | POA: Diagnosis not present

## 2022-02-26 LAB — RAD ONC ARIA SESSION SUMMARY
Course Elapsed Days: 11
Plan Fractions Treated to Date: 10
Plan Prescribed Dose Per Fraction: 1.8 Gy
Plan Total Fractions Prescribed: 25
Plan Total Prescribed Dose: 45 Gy
Reference Point Dosage Given to Date: 18 Gy
Reference Point Session Dosage Given: 1.8 Gy
Session Number: 10

## 2022-03-01 ENCOUNTER — Other Ambulatory Visit (HOSPITAL_COMMUNITY): Payer: Self-pay

## 2022-03-01 ENCOUNTER — Other Ambulatory Visit: Payer: Self-pay

## 2022-03-01 ENCOUNTER — Ambulatory Visit: Payer: Medicare Other

## 2022-03-01 ENCOUNTER — Ambulatory Visit
Admission: RE | Admit: 2022-03-01 | Discharge: 2022-03-01 | Disposition: A | Discharge status: Home / Self Care | Payer: Medicare Other | Source: Ambulatory Visit | Attending: Radiation Oncology | Admitting: Radiation Oncology

## 2022-03-01 DIAGNOSIS — C182 Malignant neoplasm of ascending colon: Secondary | ICD-10-CM | POA: Diagnosis not present

## 2022-03-01 DIAGNOSIS — E860 Dehydration: Secondary | ICD-10-CM | POA: Diagnosis not present

## 2022-03-01 DIAGNOSIS — Z510 Encounter for antineoplastic radiation therapy: Secondary | ICD-10-CM | POA: Diagnosis not present

## 2022-03-01 DIAGNOSIS — D500 Iron deficiency anemia secondary to blood loss (chronic): Secondary | ICD-10-CM | POA: Diagnosis not present

## 2022-03-01 LAB — RAD ONC ARIA SESSION SUMMARY
Course Elapsed Days: 14
Plan Fractions Treated to Date: 11
Plan Prescribed Dose Per Fraction: 1.8 Gy
Plan Total Fractions Prescribed: 25
Plan Total Prescribed Dose: 45 Gy
Reference Point Dosage Given to Date: 19.8 Gy
Reference Point Session Dosage Given: 1.8 Gy
Session Number: 11

## 2022-03-02 ENCOUNTER — Other Ambulatory Visit: Payer: Self-pay

## 2022-03-02 ENCOUNTER — Ambulatory Visit
Admission: RE | Admit: 2022-03-02 | Discharge: 2022-03-02 | Disposition: A | Discharge status: Home / Self Care | Payer: Medicare Other | Source: Ambulatory Visit | Attending: Radiation Oncology | Admitting: Radiation Oncology

## 2022-03-02 ENCOUNTER — Ambulatory Visit: Payer: Medicare Other

## 2022-03-02 DIAGNOSIS — C189 Malignant neoplasm of colon, unspecified: Secondary | ICD-10-CM | POA: Diagnosis not present

## 2022-03-02 DIAGNOSIS — E860 Dehydration: Secondary | ICD-10-CM | POA: Diagnosis not present

## 2022-03-02 DIAGNOSIS — D500 Iron deficiency anemia secondary to blood loss (chronic): Secondary | ICD-10-CM | POA: Diagnosis not present

## 2022-03-02 DIAGNOSIS — E538 Deficiency of other specified B group vitamins: Secondary | ICD-10-CM | POA: Diagnosis not present

## 2022-03-02 DIAGNOSIS — Z23 Encounter for immunization: Secondary | ICD-10-CM | POA: Diagnosis not present

## 2022-03-02 DIAGNOSIS — Z0000 Encounter for general adult medical examination without abnormal findings: Secondary | ICD-10-CM | POA: Diagnosis not present

## 2022-03-02 DIAGNOSIS — N183 Chronic kidney disease, stage 3 unspecified: Secondary | ICD-10-CM | POA: Diagnosis not present

## 2022-03-02 DIAGNOSIS — Z510 Encounter for antineoplastic radiation therapy: Secondary | ICD-10-CM | POA: Diagnosis not present

## 2022-03-02 DIAGNOSIS — E119 Type 2 diabetes mellitus without complications: Secondary | ICD-10-CM | POA: Diagnosis not present

## 2022-03-02 DIAGNOSIS — C182 Malignant neoplasm of ascending colon: Secondary | ICD-10-CM | POA: Diagnosis not present

## 2022-03-02 DIAGNOSIS — I10 Essential (primary) hypertension: Secondary | ICD-10-CM | POA: Diagnosis not present

## 2022-03-02 DIAGNOSIS — E039 Hypothyroidism, unspecified: Secondary | ICD-10-CM | POA: Diagnosis not present

## 2022-03-02 LAB — RAD ONC ARIA SESSION SUMMARY
Course Elapsed Days: 15
Plan Fractions Treated to Date: 12
Plan Prescribed Dose Per Fraction: 1.8 Gy
Plan Total Fractions Prescribed: 25
Plan Total Prescribed Dose: 45 Gy
Reference Point Dosage Given to Date: 21.6 Gy
Reference Point Session Dosage Given: 1.8 Gy
Session Number: 12

## 2022-03-02 NOTE — Progress Notes (Deleted)
Joseph Pennington   [phone] [phone]) [phone]   Clinic Follow up Note   Patient Care Team: Shirline Frees, MD as PCP - General (Family Medicine) Truitt Merle, MD as Consulting Physician (Hematology and Oncology)  Date of Service:  03/02/2022  CHIEF COMPLAINT: f/u of colon cancer   CURRENT THERAPY:  Adjuvant FOLFOX, q14d, starting 10/22/21   ASSESSMENT: *** Joseph Pennington is a 88 y.o. male with   No problem-specific Assessment & Plan notes found for this encounter.  ***   PLAN:   SUMMARY OF ONCOLOGIC HISTORY: Oncology History  Ascending colon cancer s/p lap colectomy 09/14/2021  07/23/2021 Procedure   Upper GI Endoscopy/Colonoscopy; Dr. Lorenso Courier  Colonoscopy Impression: - Likely malignant partially obstructing tumor in the ascending colon. Biopsied. - Six 3 to 8 mm polyps in the rectum and in the transverse colon, removed with a cold snare. Resected and retrieved. - Non-bleeding internal hemorrhoids.  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 Biopsied. - Multiple gastric polyps. Resected and retrieved. - Erythematous mucosa in the antrum. Biopsied. - Mucosal nodule found in the duodenum. Biopsied. - Dilated lacteals were found in the duodenum. Biopsied. - Two non-bleeding angioectasias in the duodenum.   07/23/2021 Initial Biopsy   Diagnosis 1. Duodenum, Biopsy - BENIGN DUODENAL MUCOSA - NO ACUTE INFLAMMATION, VILLOUS BLUNTING OR INCREASED INTRAEPITHELIAL LYMPHOCYTES IDENTIFIED 2. Duodenum, Biopsy, Nodule - PEPTIC DUODENITIS - NO DYSPLASIA OR MALIGNANCY IDENTIFIED 3. Stomach, biopsy - MILD CHRONIC GASTRITIS WITHOUT ACTIVITY - NO INTESTINAL METAPLASIA IDENTIFIED - SEE COMMENT 4. Stomach, polyp(s) - HYPERPLASTIC GASTRIC POLYP(S) WITH INTESTINAL METAPLASIA - NO H. PYLORI OR MALIGNANCY IDENTIFIED 5. Esophagogastric junction, biopsy - SQUAMOCOLUMNAR JUNCTION WITH CHRONIC  INFLAMMATION - NO INTESTINAL METAPLASIA, DYSPLASIA OR MALIGNANCY IDENTIFIED 6. Rectum, biopsy, and transverse - MULTIPLE FRAGMENTS OF HYPERPLASTIC POLYP(S) - NO HIGH-GRADE DYSPLASIA OR MALIGNANCY IDENTIFIED 7. Ascending Colon Biopsy, Mass - POORLY DIFFERENTIATED ADENOCARCINOMA - SEE COMMENT  Microscopic Comment 7. By immunohistochemistry, the neoplastic cells are positive for CDX2 (patchy, weak) but negative for p63, p40, CD56, chromogranin and synaptophysin. This immunoprofile is consistent with a poorly differentiated adenocarcinoma.  3. H. pylori immunohistochemistry is POSITIVE for RARE microorganisms.   07/30/2021 Imaging   EXAM: CT CHEST, ABDOMEN, AND PELVIS WITH CONTRAST  IMPRESSION: 1. Masslike region in the ascending colon extending to the level of the hepatic flexure in the right lower quadrant, concerning for malignancy. Associated pericolonic fat stranding and a few prominent lymph nodes are seen adjacent to this segment of colon, suspicious for local infiltration. No distant metastasis is seen. 2. Left inguinal hernia containing nonobstructed colon. 3. Nonspecific small ground-glass opacity in the in the right lower lobe. Attention on follow-up is recommended. 4. Small hiatal hernia. 5. Aortic atherosclerosis and coronary artery calcifications   08/06/2021 Initial Diagnosis   Malignant neoplasm of ascending colon [facility])   Primary adenocarcinoma of ascending colon ([place])  07/23/2021 Procedure   Colonoscopy by Dr. Lorenso Courier - A frond-like/villous, fungating and ulcerated partially obstructing large mass was found in the ascending colon. There was a point at which the mass could not be bypassed, even with use of an ultraslim colonoscope. It is suspected that this mass encompasses the entire cecum and ascending colon. The mass was circumferential. The mass measured fifteen cm in length. Oozing was present.    07/23/2021 Initial Biopsy   3. H. pylori immunohistochemistry is  POSITIVE for RARE microorganisms.  FINAL DIAGNOSIS Diagnosis 1. Duodenum, Biopsy - BENIGN DUODENAL MUCOSA -  NO ACUTE INFLAMMATION, VILLOUS BLUNTING OR INCREASED INTRAEPITHELIAL LYMPHOCYTES IDENTIFIED 2. Duodenum, Biopsy, Nodule - PEPTIC DUODENITIS - NO DYSPLASIA OR MALIGNANCY IDENTIFIED 3. Stomach, biopsy - MILD CHRONIC GASTRITIS WITHOUT ACTIVITY - NO INTESTINAL METAPLASIA IDENTIFIED - SEE COMMENT 4. Stomach, polyp(s) - HYPERPLASTIC GASTRIC POLYP(S) WITH INTESTINAL METAPLASIA - NO H. PYLORI OR MALIGNANCY IDENTIFIED 5. Esophagogastric junction, biopsy - SQUAMOCOLUMNAR JUNCTION WITH CHRONIC INFLAMMATION - NO INTESTINAL METAPLASIA, DYSPLASIA OR MALIGNANCY IDENTIFIED 6. Rectum, biopsy, and transverse - MULTIPLE FRAGMENTS OF HYPERPLASTIC POLYP(S) - NO HIGH-GRADE DYSPLASIA OR MALIGNANCY IDENTIFIED 7. Ascending Colon Biopsy, Mass - POORLY DIFFERENTIATED ADENOCARCINOMA - SEE COMMENT   07/30/2021 Imaging   CT CAP IMPRESSION: 1. Masslike region in the ascending colon extending to the level of the hepatic flexure in the right lower quadrant, concerning for malignancy. Associated pericolonic fat stranding and a few prominent lymph nodes are seen adjacent to this segment of colon, suspicious for local infiltration. No distant metastasis is seen. 2. Left inguinal hernia containing nonobstructed colon. 3. Nonspecific small ground-glass opacity in the in the right lower lobe. Attention on follow-up is recommended. 4. Small hiatal hernia. 5. Aortic atherosclerosis and coronary artery calcifications   09/14/2021 Surgery   PROCEDURE:  Laparoscopic converted to open right hemicolectomy Repair of small bowel serosa x 2     09/14/2021 Pathology Results   A. COLON, RIGHT, HEMI COLECTOMY:  Poorly differentiated adenocarcinoma in the ascending colon with clear margins of resection.  Tumor Size: Circumferential measuring 9.0 cm X 8.0 cm. X 2.5 cm.  Macroscopic Tumor Perforation: Not identified   Histologic Type: Adenocarcinoma.  Histologic Grade: Grade 3, poorly differentiated.  Multiple Primary Sites: Not applicable  Tumor Extension: Extends through the muscular wall into the pericolonic adipose tissue.  Lymphovascular Invasion: Not identified.  Perineural Invasion: Not identified.  Treatment Effect: No known presurgical therapy.  Margins:       Margin Status for Invasive Carcinoma: All margins are negative for invasive carcinoma       Distance from Invasive Carcinoma to proximal margin: 11 cm.       Distance from invasive carcinoma to distal margin: 12 cm.  Regional Lymph Nodes:       Number of Lymph Nodes with Tumor: None.       Number of Lymph Nodes Examined: Twenty-two (22)  Tumor Deposits: Present (slide A11)  Distant Metastasis:       Distant Site(s) Involved: None known.  Pathologic Stage Classification (pTNM, AJCC 8th Edition): pT3 pN1c  Comments: Omentum is free of neoplastic invasion.                      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LAMN)    09/14/2021 Cancer Staging   Staging form: Colon and Rectum, AJCC 8th Edition - Pathologic stage from 09/14/2021: Stage IIIB (pT3, pN1c, cM0) - Signed by Alla Feeling, NP on 10/08/2021 Stage prefix: Initial diagnosis Total positive nodes: 0 Histologic grading system: 4 grade system Histologic grade (G): G3 Laterality: Right Lymph-vascular invasion (LVI): LVI not present (absent)/not identified Tumor deposits (TD): Present Perineural invasion (PNI): Absent Microsatellite instability (MSI): Unstable high BRAF mutation: Positive   10/08/2021 Initial Diagnosis   Primary adenocarcinoma of ascending colon ([place])   10/22/2021 - 11/07/2021 Chemotherapy   Patient is on Treatment Plan : COLORECTAL FOLFOX q14d x 3 months     10/22/2021 -  Chemotherapy   Patient is on Treatment Plan : COLORECTAL FOLFOX q14d x 3 months        INTERVAL  HISTORY: *** Joseph Pennington is here for a follow up of  colon cancer  He was last  seen by me on 02/16/2022 He presents to the clinic alone/accompanied by.    All other systems were reviewed with the patient and are negative.  MEDICAL HISTORY:  Past Medical History:  Diagnosis Date   Acute cholecystitis 02/19/2018   Anemia    Chronic kidney disease    Colon cancer ([place])    Diabetes mellitus without complication (HCC)    diet controlled   GERD (gastroesophageal reflux disease)    History of blood transfusion 08/2021   History of hiatal hernia    Hypertension    Hypothyroidism    Iron deficiency anemia    Peripheral neuropathy    bilateral feet    SURGICAL HISTORY: Past Surgical History:  Procedure Laterality Date   CHOLECYSTECTOMY N/A 02/20/2018   Procedure: LAPAROSCOPIC CHOLECYSTECTOMY;  Surgeon: Excell Seltzer, MD;  Location: [facility];  Service: General;  Laterality: N/A;   COLONOSCOPY  2023   LAPAROSCOPIC RIGHT HEMI COLECTOMY Right 09/14/2021   Procedure: LAPAROSCOPIC TO OPEN RIGHT HEMI COLECTOMY;  Surgeon: Ileana Roup, MD;  Location: [facility];  Service: General;  Laterality: Right;   PORTACATH PLACEMENT N/A 10/15/2021   Procedure: INSERTION PORT-A-CATH;  Surgeon: Ileana Roup, MD;  Location: [facility];  Service: General;  Laterality: N/A;  with ultrasound and flouroscopic guidance    I have reviewed the social history and family history with the patient and they are unchanged from previous note.  ALLERGIES:  has No Known Allergies.  MEDICATIONS:  Current Outpatient Medications  Medication Sig Dispense Refill   acetaminophen (TYLENOL) 500 MG tablet Take 500-1,000 mg by mouth every 6 (six) hours as needed for moderate pain.     ARTIFICIAL TEAR SOLUTION OP Place 1 drop into both eyes daily as needed (dry eyes).     capecitabine (XELODA) 500 MG tablet Take 3 tablets (1500 mg) by mouth every 12 hours. Take within 30 minutes after meals. Take only on days of radiation, Monday through Friday. 90 tablet 1   cyanocobalamin (,VITAMIN B-12,) 1000  MCG/ML injection Inject 1,000 mcg into the muscle every 30 (thirty) days.   0   Iron, Ferrous Sulfate, 325 (65 Fe) MG TABS Take 325 mg by mouth daily.     levothyroxine (SYNTHROID) 75 MCG tablet Take 75 mcg by mouth daily before breakfast.     lisinopril (PRINIVIL,ZESTRIL) 20 MG tablet Take 20 mg by mouth daily.  3   ondansetron (ZOFRAN) 8 MG tablet Take 1 tablet (8 mg total) by mouth every 8 (eight) hours as needed for nausea or vomiting. 90 tablet 0   polyethylene glycol powder (GLYCOLAX/MIRALAX) 17 GM/SCOOP powder Take 17 g by mouth as needed for mild constipation. (Patient not taking: Reported on 01/07/2022)     potassium chloride SA (KLOR-CON M) 20 MEQ tablet One po bid x 3 days, then one po once a day 20 tablet 0   vitamin C (ASCORBIC ACID) 500 MG tablet Take 500 mg by mouth daily.     No current facility-administered medications for this visit.    PHYSICAL EXAMINATION: ECOG PERFORMANCE STATUS: {CHL ONC ECOG [phone]}  There were no vitals filed for this visit. Wt Readings from Last 3 Encounters:  02/16/22 204 lb 11.2 oz (92.9 kg)  01/29/22 200 lb 9.6 oz (91 kg)  01/13/22 209 lb 3.2 oz (94.9 kg)    {Only keep what was examined. If exam not performed, can  use .CEXAM } GENERAL:alert, no distress and comfortable SKIN: skin color, texture, turgor are normal, no rashes or significant lesions EYES: normal, Conjunctiva are pink and non-injected, sclera clear {OROPHARYNX:no exudate, no erythema and lips, buccal mucosa, and tongue normal}  NECK: supple, thyroid normal size, non-tender, without nodularity LYMPH:  no palpable lymphadenopathy in the cervical, axillary {or inguinal} LUNGS: clear to auscultation and percussion with normal breathing effort HEART: regular rate & rhythm and no murmurs and no lower extremity edema ABDOMEN:abdomen soft, non-tender and normal bowel sounds Musculoskeletal:no cyanosis of digits and no clubbing  NEURO: alert & oriented x 3 with fluent speech, no  focal motor/sensory deficits  LABORATORY DATA:  I have reviewed the data as listed    Latest Ref Rng & Units 02/23/2022    2:24 PM 02/16/2022    1:29 PM 01/29/2022    8:56 AM  CBC  WBC 4.0 - 10.5 K/uL 3.7  4.6  3.4   Hemoglobin 13.0 - 17.0 g/dL 9.9  9.9  10.9   Hematocrit 39.0 - 52.0 % 28.6  28.1  30.9   Platelets 150 - 400 K/uL 181  146  154         Latest Ref Rng & Units 02/23/2022    2:24 PM 02/16/2022    1:29 PM 01/29/2022    8:56 AM  CMP  Glucose 70 - 99 mg/dL 166  175  150   BUN 8 - 23 mg/dL _0 Creatinine 0.61 - 1.24 mg/dL 0.84  0.98  1.32   Sodium 135 - 145 mmol/L 138  139  135   Potassium 3.5 - 5.1 mmol/L 3.9  3.5  4.7   Chloride 98 - 111 mmol/L 106  106  105   CO2 22 - 32 mmol/L _1 Calcium 8.9 - 10.3 mg/dL 9.4  9.2  9.5   Total Protein 6.5 - 8.1 g/dL 6.8  6.9  7.0   Total Bilirubin 0.3 - 1.2 mg/dL 0.6  0.7  1.0   Alkaline Phos 38 - 126 U/L 90  91  87   AST 15 - 41 U/L _2 ALT 0 - 44 U/L _3 RADIOGRAPHIC STUDIES: I have personally reviewed the radiological images as listed and agreed with the findings in the report. No results found.    No orders of the defined types were placed in this encounter.  All questions were answered. The patient knows to call the clinic with any problems, questions or concerns. No barriers to learning was detected. The total time spent in the appointment was {CHL ONC TIME VISIT - ZDGUY:[id number]}.     Baldemar Friday, CMA 03/02/2022   I, Audry Riles, CMA, am acting as scribe for Truitt Merle, MD.   {Add scribe attestation statement}

## 2022-03-03 ENCOUNTER — Inpatient Hospital Stay: Payer: Medicare Other

## 2022-03-03 ENCOUNTER — Ambulatory Visit (HOSPITAL_BASED_OUTPATIENT_CLINIC_OR_DEPARTMENT_OTHER): Payer: Medicare Other | Admitting: Hematology

## 2022-03-03 ENCOUNTER — Other Ambulatory Visit: Payer: Self-pay

## 2022-03-03 ENCOUNTER — Ambulatory Visit
Admission: RE | Admit: 2022-03-03 | Discharge: 2022-03-03 | Disposition: A | Discharge status: Home / Self Care | Payer: Medicare Other | Source: Ambulatory Visit | Attending: Radiation Oncology | Admitting: Radiation Oncology

## 2022-03-03 ENCOUNTER — Inpatient Hospital Stay: Payer: Medicare Other | Admitting: Hematology

## 2022-03-03 ENCOUNTER — Encounter: Payer: Self-pay | Admitting: Hematology

## 2022-03-03 ENCOUNTER — Ambulatory Visit: Payer: Medicare Other

## 2022-03-03 DIAGNOSIS — R319 Hematuria, unspecified: Secondary | ICD-10-CM

## 2022-03-03 DIAGNOSIS — D500 Iron deficiency anemia secondary to blood loss (chronic): Secondary | ICD-10-CM

## 2022-03-03 DIAGNOSIS — Z510 Encounter for antineoplastic radiation therapy: Secondary | ICD-10-CM | POA: Diagnosis not present

## 2022-03-03 DIAGNOSIS — E860 Dehydration: Secondary | ICD-10-CM | POA: Diagnosis not present

## 2022-03-03 DIAGNOSIS — C182 Malignant neoplasm of ascending colon: Secondary | ICD-10-CM

## 2022-03-03 LAB — CBC WITH DIFFERENTIAL (CANCER CENTER ONLY)
Abs Immature Granulocytes: 0.01 10*3/uL (ref 0.00–0.07)
Basophils Absolute: 0 10*3/uL (ref 0.0–0.1)
Basophils Relative: 0 %
Eosinophils Absolute: 0.1 10*3/uL (ref 0.0–0.5)
Eosinophils Relative: 2 %
HCT: 27.8 % — ABNORMAL LOW (ref 39.0–52.0)
Hemoglobin: 9.9 g/dL — ABNORMAL LOW (ref 13.0–17.0)
Immature Granulocytes: 0 %
Lymphocytes Relative: 9 %
Lymphs Abs: 0.3 10*3/uL — ABNORMAL LOW (ref 0.7–4.0)
MCH: 35.4 pg — ABNORMAL HIGH (ref 26.0–34.0)
MCHC: 35.6 g/dL (ref 30.0–36.0)
MCV: 99.3 fL (ref 80.0–100.0)
Monocytes Absolute: 0.3 10*3/uL (ref 0.1–1.0)
Monocytes Relative: 8 %
Neutro Abs: 3 10*3/uL (ref 1.7–7.7)
Neutrophils Relative %: 81 %
Platelet Count: 106 10*3/uL — ABNORMAL LOW (ref 150–400)
RBC: 2.8 MIL/uL — ABNORMAL LOW (ref 4.22–5.81)
RDW: 14.1 % (ref 11.5–15.5)
WBC Count: 3.8 10*3/uL — ABNORMAL LOW (ref 4.0–10.5)
nRBC: 0 % (ref 0.0–0.2)

## 2022-03-03 LAB — CMP (CANCER CENTER ONLY)
ALT: 12 U/L (ref 0–44)
AST: 19 U/L (ref 15–41)
Albumin: 3.3 g/dL — ABNORMAL LOW (ref 3.5–5.0)
Alkaline Phosphatase: 74 U/L (ref 38–126)
Anion gap: 8 (ref 5–15)
BUN: 15 mg/dL (ref 8–23)
CO2: 24 mmol/L (ref 22–32)
Calcium: 9.1 mg/dL (ref 8.9–10.3)
Chloride: 108 mmol/L (ref 98–111)
Creatinine: 0.85 mg/dL (ref 0.61–1.24)
GFR, Estimated: >60 mL/min (ref >60)
Glucose, Bld: 152 mg/dL — ABNORMAL HIGH (ref 70–99)
Potassium: 3.5 mmol/L (ref 3.5–5.1)
Sodium: 140 mmol/L (ref 135–145)
Total Bilirubin: 0.9 mg/dL (ref 0.3–1.2)
Total Protein: 6.6 g/dL (ref 6.5–8.1)

## 2022-03-03 LAB — RAD ONC ARIA SESSION SUMMARY
Course Elapsed Days: 16
Plan Fractions Treated to Date: 13
Plan Prescribed Dose Per Fraction: 1.8 Gy
Plan Total Fractions Prescribed: 25
Plan Total Prescribed Dose: 45 Gy
Reference Point Dosage Given to Date: 23.4 Gy
Reference Point Session Dosage Given: 1.8 Gy
Session Number: 13

## 2022-03-03 MED ORDER — SODIUM CHLORIDE 0.9% FLUSH
10.0000 mL | Freq: Once | INTRAVENOUS | Status: AC
  Administered 2022-03-03: 10 mL

## 2022-03-03 MED ORDER — HEPARIN SOD (PORK) LOCK FLUSH 100 UNIT/ML IV SOLN
500.0000 [IU] | Freq: Once | INTRAVENOUS | Status: AC
  Administered 2022-03-03: 500 [IU]

## 2022-03-03 NOTE — Progress Notes (Signed)
[place]   [phone] [phone]   Clinic Follow up Note   Patient Care Team: Shirline Frees, MD as PCP - General (Family Medicine) Truitt Merle, MD as Consulting Physician (Hematology and Oncology)  Date of Service:  03/03/2022  I connected with Joseph Pennington on 03/03/22 at  2:00 PM EST by telephone and verified that I am speaking with the correct person using two identifiers.   I discussed the limitations, risks, security and privacy concerns of performing an evaluation and management service by telephone and the availability of in person appointments. I also discussed with the patient that there may be a patient responsible charge related to this service. The patient expressed understanding and agreed to proceed.   Patient's location:  Home  Provider's location:  Home   CHIEF COMPLAINT: f/u of colon cancer   CURRENT THERAPY:   Adjuvant FOLFOX, q14d, starting 10/22/21     ASSESSMENT:  Joseph Pennington is a 88 y.o. male with    1. 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 Dema Severin 09/14/21 -he completed 6 cycles adjuvant FOLFOX 10/22/21 - 01/13/22.  -he is on concurrent chemoRT with Xeloda now. He is tolerating moderately well.  2.  Hematuria -He noticed mild hematuria once yesterday, no dysuria or urinary frequency. -His UA and urine culture tomorrow   3. 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  -He received IV Feraheme, 1 unit of blood, and vit B12 while hospitalized for surgery -he was given IV Feraheme with first two cycles FOLFOX.     PLAN: -Labs reviewed, mild thrombocytopenia -He will continue Xeloda and radiation  -lab qweek f/u every 2wks -UA and urine culture tomorrow      SUMMARY OF ONCOLOGIC HISTORY: Oncology History  Ascending colon cancer s/p lap colectomy 09/14/2021  07/23/2021 Procedure   Upper GI Endoscopy/Colonoscopy; Dr. Lorenso Courier  Colonoscopy Impression: - Likely malignant partially obstructing tumor in the ascending colon. Biopsied. - Six 3 to 8 mm polyps in the rectum and in the transverse colon, removed with a cold snare. Resected and retrieved. - Non-bleeding internal hemorrhoids.  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 Biopsied. - Multiple gastric polyps. Resected and retrieved. - Erythematous mucosa in the antrum. Biopsied. - Mucosal nodule found in the duodenum. Biopsied. - Dilated lacteals were found in the duodenum. Biopsied. - Two non-bleeding angioectasias in the duodenum.   07/23/2021 Initial Biopsy   Diagnosis 1. Duodenum, Biopsy - BENIGN DUODENAL MUCOSA - NO ACUTE INFLAMMATION, VILLOUS BLUNTING OR INCREASED INTRAEPITHELIAL LYMPHOCYTES IDENTIFIED 2. Duodenum, Biopsy, Nodule - PEPTIC DUODENITIS - NO DYSPLASIA OR MALIGNANCY IDENTIFIED 3. Stomach, biopsy - MILD CHRONIC GASTRITIS WITHOUT ACTIVITY - NO INTESTINAL METAPLASIA IDENTIFIED - SEE COMMENT 4. Stomach, polyp(s) - HYPERPLASTIC GASTRIC POLYP(S) WITH INTESTINAL METAPLASIA - NO H. PYLORI OR MALIGNANCY IDENTIFIED 5. Esophagogastric junction, biopsy - SQUAMOCOLUMNAR JUNCTION WITH CHRONIC INFLAMMATION - NO INTESTINAL METAPLASIA, DYSPLASIA OR MALIGNANCY IDENTIFIED 6. Rectum, biopsy, and transverse - MULTIPLE FRAGMENTS OF HYPERPLASTIC POLYP(S) - NO HIGH-GRADE DYSPLASIA OR MALIGNANCY IDENTIFIED 7. Ascending Colon Biopsy, Mass - POORLY DIFFERENTIATED ADENOCARCINOMA - SEE COMMENT  Microscopic Comment 7. By immunohistochemistry, the neoplastic cells are positive for CDX2 (patchy, weak) but negative for p63, p40, CD56, chromogranin and synaptophysin. This immunoprofile  is consistent with a poorly differentiated  adenocarcinoma.  3. H. pylori immunohistochemistry is POSITIVE for RARE microorganisms.   07/30/2021 Imaging   EXAM: CT CHEST, ABDOMEN, AND PELVIS WITH CONTRAST  IMPRESSION: 1. Masslike region in the ascending colon extending to the level of the hepatic flexure in the right lower quadrant, concerning for malignancy. Associated pericolonic fat stranding and a few prominent lymph nodes are seen adjacent to this segment of colon, suspicious for local infiltration. No distant metastasis is seen. 2. Left inguinal hernia containing nonobstructed colon. 3. Nonspecific small ground-glass opacity in the in the right lower lobe. Attention on follow-up is recommended. 4. Small hiatal hernia. 5. Aortic atherosclerosis and coronary artery calcifications   08/06/2021 Initial Diagnosis   Malignant neoplasm of ascending colon [facility])   Primary adenocarcinoma of ascending colon ([place])  07/23/2021 Procedure   Colonoscopy by Dr. Lorenso Courier - A frond-like/villous, fungating and ulcerated partially obstructing large mass was found in the ascending colon. There was a point at which the mass could not be bypassed, even with use of an ultraslim colonoscope. It is suspected that this mass encompasses the entire cecum and ascending colon. The mass was circumferential. The mass measured fifteen cm in length. Oozing was present.    07/23/2021 Initial Biopsy   3. H. pylori immunohistochemistry is POSITIVE for RARE microorganisms.  FINAL DIAGNOSIS Diagnosis 1. Duodenum, Biopsy - BENIGN DUODENAL MUCOSA - NO ACUTE INFLAMMATION, VILLOUS BLUNTING OR INCREASED INTRAEPITHELIAL LYMPHOCYTES IDENTIFIED 2. Duodenum, Biopsy, Nodule - PEPTIC DUODENITIS - NO DYSPLASIA OR MALIGNANCY IDENTIFIED 3. Stomach, biopsy - MILD CHRONIC GASTRITIS WITHOUT ACTIVITY - NO INTESTINAL METAPLASIA IDENTIFIED - SEE COMMENT 4. Stomach, polyp(s) - HYPERPLASTIC GASTRIC POLYP(S) WITH INTESTINAL METAPLASIA - NO H. PYLORI OR MALIGNANCY IDENTIFIED 5.  Esophagogastric junction, biopsy - SQUAMOCOLUMNAR JUNCTION WITH CHRONIC INFLAMMATION - NO INTESTINAL METAPLASIA, DYSPLASIA OR MALIGNANCY IDENTIFIED 6. Rectum, biopsy, and transverse - MULTIPLE FRAGMENTS OF HYPERPLASTIC POLYP(S) - NO HIGH-GRADE DYSPLASIA OR MALIGNANCY IDENTIFIED 7. Ascending Colon Biopsy, Mass - POORLY DIFFERENTIATED ADENOCARCINOMA - SEE COMMENT   07/30/2021 Imaging   CT CAP IMPRESSION: 1. Masslike region in the ascending colon extending to the level of the hepatic flexure in the right lower quadrant, concerning for malignancy. Associated pericolonic fat stranding and a few prominent lymph nodes are seen adjacent to this segment of colon, suspicious for local infiltration. No distant metastasis is seen. 2. Left inguinal hernia containing nonobstructed colon. 3. Nonspecific small ground-glass opacity in the in the right lower lobe. Attention on follow-up is recommended. 4. Small hiatal hernia. 5. Aortic atherosclerosis and coronary artery calcifications   09/14/2021 Surgery   PROCEDURE:  Laparoscopic converted to open right hemicolectomy Repair of small bowel serosa x 2     09/14/2021 Pathology Results   A. COLON, RIGHT, HEMI COLECTOMY:  Poorly differentiated adenocarcinoma in the ascending colon with clear margins of resection.  Tumor Size: Circumferential measuring 9.0 cm X 8.0 cm. X 2.5 cm.  Macroscopic Tumor Perforation: Not identified  Histologic Type: Adenocarcinoma.  Histologic Grade: Grade 3, poorly differentiated.  Multiple Primary Sites: Not applicable  Tumor Extension: Extends through the muscular wall into the pericolonic adipose tissue.  Lymphovascular Invasion: Not identified.  Perineural Invasion: Not identified.  Treatment Effect: No known presurgical therapy.  Margins:       Margin Status for Invasive Carcinoma: All margins are negative for invasive carcinoma       Distance from Invasive Carcinoma to proximal margin: 11 cm.       Distance from  invasive carcinoma  to distal margin: 12 cm.  Regional Lymph Nodes:       Number of Lymph Nodes with Tumor: None.       Number of Lymph Nodes Examined: Twenty-two (22)  Tumor Deposits: Present (slide A11)  Distant Metastasis:       Distant Site(s) Involved: None known.  Pathologic Stage Classification (pTNM, AJCC 8th Edition): pT3 pN1c  Comments: Omentum is free of neoplastic invasion.                      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LAMN)    09/14/2021 Cancer Staging   Staging form: Colon and Rectum, AJCC 8th Edition - Pathologic stage from 09/14/2021: Stage IIIB (pT3, pN1c, cM0) - Signed by Alla Feeling, NP on 10/08/2021 Stage prefix: Initial diagnosis Total positive nodes: 0 Histologic grading system: 4 grade system Histologic grade (G): G3 Laterality: Right Lymph-vascular invasion (LVI): LVI not present (absent)/not identified Tumor deposits (TD): Present Perineural invasion (PNI): Absent Microsatellite instability (MSI): Unstable high BRAF mutation: Positive   10/08/2021 Initial Diagnosis   Primary adenocarcinoma of ascending colon ([place])   10/22/2021 - 11/07/2021 Chemotherapy   Patient is on Treatment Plan : COLORECTAL FOLFOX q14d x 3 months     10/22/2021 -  Chemotherapy   Patient is on Treatment Plan : COLORECTAL FOLFOX q14d x 3 months        INTERVAL HISTORY:  Joseph Pennington is here for a follow up of  colon cancer.  This is scheduled as a phone visit, I have verified his identity.  He is also a week chemoradiation, overall tolerating well.  He noticed an episode of hematuria (he saw blood staining in his underwear, no blood in the toilet) yesterday, no dysuria or urinary frequency.  He denies history of kidney stone.  He has mild fatigue, no significant rectal pain or skin patient from radiation.  No other complaints.  All other systems were reviewed with the patient and are negative.  MEDICAL HISTORY:  Past Medical History:  Diagnosis Date   Acute  cholecystitis 02/19/2018   Anemia    Chronic kidney disease    Colon cancer ([place])    Diabetes mellitus without complication (H[facility])    diet controlled   GERD (gastroesophageal reflux disease)    History of blood transfusion 08/2021   History of hiatal hernia    Hypertension    Hypothyroidism    Iron deficiency anemia    Peripheral neuropathy    bilateral feet    SURGICAL HISTORY: Past Surgical History:  Procedure Laterality Date   CHOLECYSTECTOMY N/A 02/20/2018   Procedure: LAPAROSCOPIC CHOLECYSTECTOMY;  Surgeon: Excell Seltzer, MD;  Location: [facility];  Service: General;  Laterality: N/A;   COLONOSCOPY  2023   LAPAROSCOPIC RIGHT HEMI COLECTOMY Right 09/14/2021   Procedure: LAPAROSCOPIC TO OPEN RIGHT HEMI COLECTOMY;  Surgeon: Ileana Roup, MD;  Location: [facility];  Service: General;  Laterality: Right;   PORTACATH PLACEMENT N/A 10/15/2021   Procedure: INSERTION PORT-A-CATH;  Surgeon: Ileana Roup, MD;  Location: [facility];  Service: General;  Laterality: N/A;  with ultrasound and flouroscopic guidance    I have reviewed the social history and family history with the patient and they are unchanged from previous note.  ALLERGIES:  has No Known Allergies.  MEDICATIONS:  Current Outpatient Medications  Medication Sig Dispense Refill   acetaminophen (TYLENOL) 500 MG tablet Take 500-1,000 mg by mouth every 6 (six) hours as needed for moderate pain.  ARTIFICIAL TEAR SOLUTION OP Place 1 drop into both eyes daily as needed (dry eyes).     capecitabine (XELODA) 500 MG tablet Take 3 tablets (1500 mg) by mouth every 12 hours. Take within 30 minutes after meals. Take only on days of radiation, Monday through Friday. 90 tablet 1   cyanocobalamin (,VITAMIN B-12,) 1000 MCG/ML injection Inject 1,000 mcg into the muscle every 30 (thirty) days.   0   Iron, Ferrous Sulfate, 325 (65 Fe) MG TABS Take 325 mg by mouth daily.     levothyroxine (SYNTHROID) 75 MCG tablet Take 75 mcg by  mouth daily before breakfast.     lisinopril (PRINIVIL,ZESTRIL) 20 MG tablet Take 20 mg by mouth daily.  3   ondansetron (ZOFRAN) 8 MG tablet Take 1 tablet (8 mg total) by mouth every 8 (eight) hours as needed for nausea or vomiting. 90 tablet 0   polyethylene glycol powder (GLYCOLAX/MIRALAX) 17 GM/SCOOP powder Take 17 g by mouth as needed for mild constipation. (Patient not taking: Reported on 01/07/2022)     potassium chloride SA (KLOR-CON M) 20 MEQ tablet One po bid x 3 days, then one po once a day 20 tablet 0   vitamin C (ASCORBIC ACID) 500 MG tablet Take 500 mg by mouth daily.     No current facility-administered medications for this visit.    PHYSICAL EXAMINATION: ECOG PERFORMANCE STATUS: 1 - Symptomatic but completely ambulatory  There were no vitals filed for this visit.  Wt Readings from Last 3 Encounters:  02/16/22 204 lb 11.2 oz (92.9 kg)  01/29/22 200 lb 9.6 oz (91 kg)  01/13/22 209 lb 3.2 oz (94.9 kg)     GENERAL:alert, no distress and comfortable SKIN: skin color normal, no rashes or significant lesions EYES: normal, Conjunctiva are pink and non-injected, sclera clear  NEURO: alert & oriented x 3 with fluent speech   LABORATORY DATA:  I have reviewed the data as listed    Latest Ref Rng & Units 03/03/2022    2:59 PM 02/23/2022    2:24 PM 02/16/2022    1:29 PM  CBC  WBC 4.0 - 10.5 K/uL 3.8  3.7  4.6   Hemoglobin 13.0 - 17.0 g/dL 9.9  9.9  9.9   Hematocrit 39.0 - 52.0 % 27.8  28.6  28.1   Platelets 150 - 400 K/uL 106  181  146         Latest Ref Rng & Units 03/03/2022    2:59 PM 02/23/2022    2:24 PM 02/16/2022    1:29 PM  CMP  Glucose 70 - 99 mg/dL 152  166  175   BUN 8 - 23 mg/dL _0 Creatinine 0.61 - 1.24 mg/dL 0.85  0.84  0.98   Sodium 135 - 145 mmol/L 140  138  139   Potassium 3.5 - 5.1 mmol/L 3.5  3.9  3.5   Chloride 98 - 111 mmol/L 108  106  106   CO2 22 - 32 mmol/L _1 Calcium 8.9 - 10.3 mg/dL 9.1  9.4  9.2   Total Protein  6.5 - 8.1 g/dL 6.6  6.8  6.9   Total Bilirubin 0.3 - 1.2 mg/dL 0.9  0.6  0.7   Alkaline Phos 38 - 126 U/L 74  90  91   AST 15 - 41 U/L _2 ALT 0 - 44 U/L 12  8  7  RADIOGRAPHIC STUDIES: I have personally reviewed the radiological images as listed and agreed with the findings in the report. No results found.    Orders Placed This Encounter  Procedures   Urine Culture    Standing Status:   Future    Standing Expiration Date:   03/04/2023   Urinalysis, Complete w Microscopic    Standing Status:   Future    Standing Expiration Date:   03/04/2023   I discussed the assessment and treatment plan with the patient. The patient was provided an opportunity to ask questions and all were answered. The patient agreed with the plan and demonstrated an understanding of the instructions.   The patient was advised to call back or seek an in-person evaluation if the symptoms worsen or if the condition fails to improve as anticipated.  I provided 11 minutes of non face-to-face telephone visit time during this encounter, and > 50% was spent counseling as documented under my assessment & plan.   Truitt Merle, MD 03/03/2022

## 2022-03-04 ENCOUNTER — Ambulatory Visit: Payer: Medicare Other

## 2022-03-04 ENCOUNTER — Other Ambulatory Visit: Payer: Self-pay

## 2022-03-04 ENCOUNTER — Ambulatory Visit
Admission: RE | Admit: 2022-03-04 | Discharge: 2022-03-04 | Disposition: A | Discharge status: Home / Self Care | Payer: Medicare Other | Source: Ambulatory Visit | Attending: Radiation Oncology | Admitting: Radiation Oncology

## 2022-03-04 DIAGNOSIS — D500 Iron deficiency anemia secondary to blood loss (chronic): Secondary | ICD-10-CM | POA: Diagnosis not present

## 2022-03-04 DIAGNOSIS — E860 Dehydration: Secondary | ICD-10-CM | POA: Diagnosis not present

## 2022-03-04 DIAGNOSIS — Z510 Encounter for antineoplastic radiation therapy: Secondary | ICD-10-CM | POA: Diagnosis not present

## 2022-03-04 DIAGNOSIS — C182 Malignant neoplasm of ascending colon: Secondary | ICD-10-CM | POA: Diagnosis not present

## 2022-03-04 LAB — RAD ONC ARIA SESSION SUMMARY
Course Elapsed Days: 17
Plan Fractions Treated to Date: 14
Plan Prescribed Dose Per Fraction: 1.8 Gy
Plan Total Fractions Prescribed: 25
Plan Total Prescribed Dose: 45 Gy
Reference Point Dosage Given to Date: 25.2 Gy
Reference Point Session Dosage Given: 1.8 Gy
Session Number: 14

## 2022-03-05 ENCOUNTER — Ambulatory Visit
Admission: RE | Admit: 2022-03-05 | Discharge: 2022-03-05 | Disposition: A | Discharge status: Home / Self Care | Payer: Medicare Other | Source: Ambulatory Visit | Attending: Radiation Oncology | Admitting: Radiation Oncology

## 2022-03-05 ENCOUNTER — Other Ambulatory Visit: Payer: Self-pay

## 2022-03-05 DIAGNOSIS — Z510 Encounter for antineoplastic radiation therapy: Secondary | ICD-10-CM | POA: Diagnosis not present

## 2022-03-05 DIAGNOSIS — E860 Dehydration: Secondary | ICD-10-CM | POA: Diagnosis not present

## 2022-03-05 DIAGNOSIS — D500 Iron deficiency anemia secondary to blood loss (chronic): Secondary | ICD-10-CM | POA: Diagnosis not present

## 2022-03-05 DIAGNOSIS — C182 Malignant neoplasm of ascending colon: Secondary | ICD-10-CM | POA: Diagnosis not present

## 2022-03-05 LAB — RAD ONC ARIA SESSION SUMMARY
Course Elapsed Days: 18
Plan Fractions Treated to Date: 15
Plan Prescribed Dose Per Fraction: 1.8 Gy
Plan Total Fractions Prescribed: 25
Plan Total Prescribed Dose: 45 Gy
Reference Point Dosage Given to Date: 27 Gy
Reference Point Session Dosage Given: 1.8 Gy
Session Number: 15

## 2022-03-08 ENCOUNTER — Ambulatory Visit
Admission: RE | Admit: 2022-03-08 | Discharge: 2022-03-08 | Disposition: A | Discharge status: Home / Self Care | Payer: Medicare Other | Source: Ambulatory Visit | Attending: Radiation Oncology | Admitting: Radiation Oncology

## 2022-03-08 ENCOUNTER — Other Ambulatory Visit: Payer: Self-pay

## 2022-03-08 DIAGNOSIS — Z510 Encounter for antineoplastic radiation therapy: Secondary | ICD-10-CM | POA: Diagnosis not present

## 2022-03-08 DIAGNOSIS — E860 Dehydration: Secondary | ICD-10-CM | POA: Diagnosis not present

## 2022-03-08 DIAGNOSIS — C182 Malignant neoplasm of ascending colon: Secondary | ICD-10-CM | POA: Diagnosis not present

## 2022-03-08 DIAGNOSIS — D500 Iron deficiency anemia secondary to blood loss (chronic): Secondary | ICD-10-CM | POA: Diagnosis not present

## 2022-03-08 LAB — RAD ONC ARIA SESSION SUMMARY
Course Elapsed Days: 21
Plan Fractions Treated to Date: 16
Plan Prescribed Dose Per Fraction: 1.8 Gy
Plan Total Fractions Prescribed: 25
Plan Total Prescribed Dose: 45 Gy
Reference Point Dosage Given to Date: 28.8 Gy
Reference Point Session Dosage Given: 1.8 Gy
Session Number: 16

## 2022-03-09 ENCOUNTER — Ambulatory Visit
Admission: RE | Admit: 2022-03-09 | Discharge: 2022-03-09 | Disposition: A | Discharge status: Home / Self Care | Payer: Medicare Other | Source: Ambulatory Visit | Attending: Radiation Oncology | Admitting: Radiation Oncology

## 2022-03-09 ENCOUNTER — Other Ambulatory Visit: Payer: Self-pay

## 2022-03-09 DIAGNOSIS — E860 Dehydration: Secondary | ICD-10-CM | POA: Diagnosis not present

## 2022-03-09 DIAGNOSIS — C182 Malignant neoplasm of ascending colon: Secondary | ICD-10-CM | POA: Diagnosis not present

## 2022-03-09 DIAGNOSIS — Z510 Encounter for antineoplastic radiation therapy: Secondary | ICD-10-CM | POA: Diagnosis not present

## 2022-03-09 DIAGNOSIS — D500 Iron deficiency anemia secondary to blood loss (chronic): Secondary | ICD-10-CM | POA: Diagnosis not present

## 2022-03-09 LAB — RAD ONC ARIA SESSION SUMMARY
Course Elapsed Days: 22
Plan Fractions Treated to Date: 17
Plan Prescribed Dose Per Fraction: 1.8 Gy
Plan Total Fractions Prescribed: 25
Plan Total Prescribed Dose: 45 Gy
Reference Point Dosage Given to Date: 30.6 Gy
Reference Point Session Dosage Given: 1.8 Gy
Session Number: 17

## 2022-03-10 ENCOUNTER — Other Ambulatory Visit: Payer: Self-pay

## 2022-03-10 ENCOUNTER — Ambulatory Visit
Admission: RE | Admit: 2022-03-10 | Discharge: 2022-03-10 | Disposition: A | Discharge status: Home / Self Care | Payer: Medicare Other | Source: Ambulatory Visit | Attending: Radiation Oncology | Admitting: Radiation Oncology

## 2022-03-10 ENCOUNTER — Inpatient Hospital Stay: Payer: Medicare Other

## 2022-03-10 ENCOUNTER — Ambulatory Visit: Payer: Medicare Other | Admitting: Radiation Oncology

## 2022-03-10 DIAGNOSIS — C182 Malignant neoplasm of ascending colon: Secondary | ICD-10-CM | POA: Diagnosis not present

## 2022-03-10 DIAGNOSIS — D500 Iron deficiency anemia secondary to blood loss (chronic): Secondary | ICD-10-CM | POA: Diagnosis not present

## 2022-03-10 DIAGNOSIS — Z510 Encounter for antineoplastic radiation therapy: Secondary | ICD-10-CM | POA: Diagnosis not present

## 2022-03-10 DIAGNOSIS — E860 Dehydration: Secondary | ICD-10-CM | POA: Diagnosis not present

## 2022-03-10 LAB — CMP (CANCER CENTER ONLY)
ALT: 11 U/L (ref 0–44)
AST: 18 U/L (ref 15–41)
Albumin: 3.3 g/dL — ABNORMAL LOW (ref 3.5–5.0)
Alkaline Phosphatase: 68 U/L (ref 38–126)
Anion gap: 7 (ref 5–15)
BUN: 19 mg/dL (ref 8–23)
CO2: 22 mmol/L (ref 22–32)
Calcium: 8.5 mg/dL — ABNORMAL LOW (ref 8.9–10.3)
Chloride: 106 mmol/L (ref 98–111)
Creatinine: 1.11 mg/dL (ref 0.61–1.24)
GFR, Estimated: >60 mL/min (ref >60)
Glucose, Bld: 172 mg/dL — ABNORMAL HIGH (ref 70–99)
Potassium: 3 mmol/L — ABNORMAL LOW (ref 3.5–5.1)
Sodium: 135 mmol/L (ref 135–145)
Total Bilirubin: 1.3 mg/dL — ABNORMAL HIGH (ref 0.3–1.2)
Total Protein: 6.4 g/dL — ABNORMAL LOW (ref 6.5–8.1)

## 2022-03-10 LAB — CBC WITH DIFFERENTIAL (CANCER CENTER ONLY)
Abs Immature Granulocytes: 0.01 10*3/uL (ref 0.00–0.07)
Basophils Absolute: 0 10*3/uL (ref 0.0–0.1)
Basophils Relative: 0 %
Eosinophils Absolute: 0 10*3/uL (ref 0.0–0.5)
Eosinophils Relative: 1 %
HCT: 25.9 % — ABNORMAL LOW (ref 39.0–52.0)
Hemoglobin: 9.3 g/dL — ABNORMAL LOW (ref 13.0–17.0)
Immature Granulocytes: 0 %
Lymphocytes Relative: 6 %
Lymphs Abs: 0.2 10*3/uL — ABNORMAL LOW (ref 0.7–4.0)
MCH: 35.5 pg — ABNORMAL HIGH (ref 26.0–34.0)
MCHC: 35.9 g/dL (ref 30.0–36.0)
MCV: 98.9 fL (ref 80.0–100.0)
Monocytes Absolute: 0.5 10*3/uL (ref 0.1–1.0)
Monocytes Relative: 14 %
Neutro Abs: 2.9 10*3/uL (ref 1.7–7.7)
Neutrophils Relative %: 79 %
Platelet Count: 114 10*3/uL — ABNORMAL LOW (ref 150–400)
RBC: 2.62 MIL/uL — ABNORMAL LOW (ref 4.22–5.81)
RDW: 14.9 % (ref 11.5–15.5)
WBC Count: 3.7 10*3/uL — ABNORMAL LOW (ref 4.0–10.5)
nRBC: 0 % (ref 0.0–0.2)

## 2022-03-10 LAB — RAD ONC ARIA SESSION SUMMARY
Course Elapsed Days: 23
Plan Fractions Treated to Date: 18
Plan Prescribed Dose Per Fraction: 1.8 Gy
Plan Total Fractions Prescribed: 25
Plan Total Prescribed Dose: 45 Gy
Reference Point Dosage Given to Date: 32.4 Gy
Reference Point Session Dosage Given: 1.8 Gy
Session Number: 18

## 2022-03-10 MED ORDER — SODIUM CHLORIDE 0.9% FLUSH
10.0000 mL | Freq: Once | INTRAVENOUS | Status: AC
  Administered 2022-03-10: 10 mL

## 2022-03-10 MED ORDER — HEPARIN SOD (PORK) LOCK FLUSH 100 UNIT/ML IV SOLN
500.0000 [IU] | Freq: Once | INTRAVENOUS | Status: AC
  Administered 2022-03-10: 500 [IU]

## 2022-03-10 NOTE — Patient Instructions (Signed)

## 2022-03-11 ENCOUNTER — Other Ambulatory Visit: Payer: Self-pay

## 2022-03-11 ENCOUNTER — Other Ambulatory Visit: Payer: Self-pay | Admitting: Nurse Practitioner

## 2022-03-11 ENCOUNTER — Telehealth: Payer: Self-pay

## 2022-03-11 ENCOUNTER — Ambulatory Visit
Admission: RE | Admit: 2022-03-11 | Discharge: 2022-03-11 | Disposition: A | Discharge status: Home / Self Care | Payer: Medicare Other | Source: Ambulatory Visit | Attending: Radiation Oncology | Admitting: Radiation Oncology

## 2022-03-11 ENCOUNTER — Ambulatory Visit: Payer: Medicare Other

## 2022-03-11 DIAGNOSIS — D500 Iron deficiency anemia secondary to blood loss (chronic): Secondary | ICD-10-CM | POA: Diagnosis not present

## 2022-03-11 DIAGNOSIS — E860 Dehydration: Secondary | ICD-10-CM | POA: Diagnosis not present

## 2022-03-11 DIAGNOSIS — Z510 Encounter for antineoplastic radiation therapy: Secondary | ICD-10-CM | POA: Diagnosis not present

## 2022-03-11 DIAGNOSIS — C182 Malignant neoplasm of ascending colon: Secondary | ICD-10-CM | POA: Diagnosis not present

## 2022-03-11 LAB — RAD ONC ARIA SESSION SUMMARY
Course Elapsed Days: 24
Plan Fractions Treated to Date: 19
Plan Prescribed Dose Per Fraction: 1.8 Gy
Plan Total Fractions Prescribed: 25
Plan Total Prescribed Dose: 45 Gy
Reference Point Dosage Given to Date: 34.2 Gy
Reference Point Session Dosage Given: 1.8 Gy
Session Number: 19

## 2022-03-11 MED ORDER — POTASSIUM CHLORIDE CRYS ER 20 MEQ PO TBCR: 20.0000 meq | EXTENDED_RELEASE_TABLET | Freq: Two times a day (BID) | ORAL | 0 refills | Status: DC

## 2022-03-11 NOTE — Telephone Encounter (Signed)
90 day supply is not needed at this time

## 2022-03-11 NOTE — Telephone Encounter (Addendum)
Called patient and relay message below. Patient voiced full understanding     ----- Message from Alla Feeling, NP sent at 03/11/2022  9:28 AM EST ----- Please call patient, let him know K is low 3.0, I refilled oral potassium for him to take 1 tab twice daily. Also bili slightly high, likely from Xeloda. Please hydrate and keep lab appt as scheduled next week.  Thanks, Regan Rakers, NP

## 2022-03-12 ENCOUNTER — Ambulatory Visit: Payer: Medicare Other

## 2022-03-12 ENCOUNTER — Other Ambulatory Visit: Payer: Self-pay

## 2022-03-12 ENCOUNTER — Ambulatory Visit
Admission: RE | Admit: 2022-03-12 | Discharge: 2022-03-12 | Disposition: A | Discharge status: Home / Self Care | Payer: Medicare Other | Source: Ambulatory Visit | Attending: Radiation Oncology | Admitting: Radiation Oncology

## 2022-03-12 DIAGNOSIS — E860 Dehydration: Secondary | ICD-10-CM | POA: Diagnosis not present

## 2022-03-12 DIAGNOSIS — D500 Iron deficiency anemia secondary to blood loss (chronic): Secondary | ICD-10-CM | POA: Diagnosis not present

## 2022-03-12 DIAGNOSIS — Z510 Encounter for antineoplastic radiation therapy: Secondary | ICD-10-CM | POA: Diagnosis not present

## 2022-03-12 DIAGNOSIS — C182 Malignant neoplasm of ascending colon: Secondary | ICD-10-CM | POA: Diagnosis not present

## 2022-03-12 LAB — RAD ONC ARIA SESSION SUMMARY
Course Elapsed Days: 25
Plan Fractions Treated to Date: 20
Plan Prescribed Dose Per Fraction: 1.8 Gy
Plan Total Fractions Prescribed: 25
Plan Total Prescribed Dose: 45 Gy
Reference Point Dosage Given to Date: 36 Gy
Reference Point Session Dosage Given: 1.8 Gy
Session Number: 20

## 2022-03-15 ENCOUNTER — Ambulatory Visit: Payer: Medicare Other

## 2022-03-16 ENCOUNTER — Ambulatory Visit
Admission: RE | Admit: 2022-03-16 | Discharge: 2022-03-16 | Disposition: A | Discharge status: Home / Self Care | Payer: Medicare Other | Source: Ambulatory Visit | Attending: Radiation Oncology | Admitting: Radiation Oncology

## 2022-03-16 ENCOUNTER — Other Ambulatory Visit: Payer: Self-pay

## 2022-03-16 DIAGNOSIS — Z510 Encounter for antineoplastic radiation therapy: Secondary | ICD-10-CM | POA: Diagnosis not present

## 2022-03-16 DIAGNOSIS — D500 Iron deficiency anemia secondary to blood loss (chronic): Secondary | ICD-10-CM | POA: Diagnosis not present

## 2022-03-16 DIAGNOSIS — E860 Dehydration: Secondary | ICD-10-CM | POA: Diagnosis not present

## 2022-03-16 DIAGNOSIS — C182 Malignant neoplasm of ascending colon: Secondary | ICD-10-CM | POA: Diagnosis not present

## 2022-03-16 LAB — RAD ONC ARIA SESSION SUMMARY
Course Elapsed Days: 29
Plan Fractions Treated to Date: 21
Plan Prescribed Dose Per Fraction: 1.8 Gy
Plan Total Fractions Prescribed: 25
Plan Total Prescribed Dose: 45 Gy
Reference Point Dosage Given to Date: 37.8 Gy
Reference Point Session Dosage Given: 1.8 Gy
Session Number: 21

## 2022-03-17 ENCOUNTER — Other Ambulatory Visit: Payer: Self-pay

## 2022-03-17 ENCOUNTER — Inpatient Hospital Stay: Payer: Medicare Other

## 2022-03-17 ENCOUNTER — Ambulatory Visit
Admission: RE | Admit: 2022-03-17 | Discharge: 2022-03-17 | Disposition: A | Discharge status: Home / Self Care | Payer: Medicare Other | Source: Ambulatory Visit | Attending: Radiation Oncology | Admitting: Radiation Oncology

## 2022-03-17 DIAGNOSIS — C182 Malignant neoplasm of ascending colon: Secondary | ICD-10-CM

## 2022-03-17 DIAGNOSIS — E860 Dehydration: Secondary | ICD-10-CM

## 2022-03-17 DIAGNOSIS — D500 Iron deficiency anemia secondary to blood loss (chronic): Secondary | ICD-10-CM

## 2022-03-17 DIAGNOSIS — Z510 Encounter for antineoplastic radiation therapy: Secondary | ICD-10-CM | POA: Diagnosis not present

## 2022-03-17 LAB — CBC WITH DIFFERENTIAL (CANCER CENTER ONLY)
Abs Immature Granulocytes: 0.03 K/uL (ref 0.00–0.07)
Basophils Absolute: 0 K/uL (ref 0.0–0.1)
Basophils Relative: 0 %
Eosinophils Absolute: 0 K/uL (ref 0.0–0.5)
Eosinophils Relative: 1 %
HCT: 24.2 % — ABNORMAL LOW (ref 39.0–52.0)
Hemoglobin: 8.8 g/dL — ABNORMAL LOW (ref 13.0–17.0)
Immature Granulocytes: 1 %
Lymphocytes Relative: 5 %
Lymphs Abs: 0.2 K/uL — ABNORMAL LOW (ref 0.7–4.0)
MCH: 35.8 pg — ABNORMAL HIGH (ref 26.0–34.0)
MCHC: 36.4 g/dL — ABNORMAL HIGH (ref 30.0–36.0)
MCV: 98.4 fL (ref 80.0–100.0)
Monocytes Absolute: 0.6 K/uL (ref 0.1–1.0)
Monocytes Relative: 11 %
Neutro Abs: 4.4 K/uL (ref 1.7–7.7)
Neutrophils Relative %: 82 %
Platelet Count: 141 K/uL — ABNORMAL LOW (ref 150–400)
RBC: 2.46 MIL/uL — ABNORMAL LOW (ref 4.22–5.81)
RDW: 15.1 % (ref 11.5–15.5)
WBC Count: 5.3 K/uL (ref 4.0–10.5)
nRBC: 0 % (ref 0.0–0.2)

## 2022-03-17 LAB — RAD ONC ARIA SESSION SUMMARY
Course Elapsed Days: 30
Plan Fractions Treated to Date: 22
Plan Prescribed Dose Per Fraction: 1.8 Gy
Plan Total Fractions Prescribed: 25
Plan Total Prescribed Dose: 45 Gy
Reference Point Dosage Given to Date: 39.6 Gy
Reference Point Session Dosage Given: 1.8 Gy
Session Number: 22

## 2022-03-17 LAB — CMP (CANCER CENTER ONLY)
ALT: 9 U/L (ref 0–44)
AST: 15 U/L (ref 15–41)
Albumin: 2.7 g/dL — ABNORMAL LOW (ref 3.5–5.0)
Alkaline Phosphatase: 66 U/L (ref 38–126)
Anion gap: 5 (ref 5–15)
BUN: 17 mg/dL (ref 8–23)
CO2: 24 mmol/L (ref 22–32)
Calcium: 8.6 mg/dL — ABNORMAL LOW (ref 8.9–10.3)
Chloride: 107 mmol/L (ref 98–111)
Creatinine: 0.96 mg/dL (ref 0.61–1.24)
GFR, Estimated: >60 mL/min (ref >60)
Glucose, Bld: 130 mg/dL — ABNORMAL HIGH (ref 70–99)
Potassium: 3.2 mmol/L — ABNORMAL LOW (ref 3.5–5.1)
Sodium: 136 mmol/L (ref 135–145)
Total Bilirubin: 1 mg/dL (ref 0.3–1.2)
Total Protein: 5.9 g/dL — ABNORMAL LOW (ref 6.5–8.1)

## 2022-03-17 MED ORDER — SODIUM CHLORIDE 0.9% FLUSH
10.0000 mL | Freq: Once | INTRAVENOUS | Status: AC
  Administered 2022-03-17: 10 mL

## 2022-03-17 MED ORDER — HEPARIN SOD (PORK) LOCK FLUSH 100 UNIT/ML IV SOLN
500.0000 [IU] | Freq: Once | INTRAVENOUS | Status: AC
  Administered 2022-03-17: 500 [IU]

## 2022-03-18 ENCOUNTER — Other Ambulatory Visit: Payer: Self-pay

## 2022-03-18 ENCOUNTER — Ambulatory Visit
Admission: RE | Admit: 2022-03-18 | Discharge: 2022-03-18 | Disposition: A | Discharge status: Home / Self Care | Payer: Medicare Other | Source: Ambulatory Visit | Attending: Radiation Oncology | Admitting: Radiation Oncology

## 2022-03-18 ENCOUNTER — Other Ambulatory Visit (HOSPITAL_COMMUNITY): Payer: Self-pay

## 2022-03-18 ENCOUNTER — Other Ambulatory Visit: Payer: Self-pay | Admitting: Hematology

## 2022-03-18 ENCOUNTER — Telehealth: Payer: Self-pay

## 2022-03-18 DIAGNOSIS — C182 Malignant neoplasm of ascending colon: Secondary | ICD-10-CM | POA: Diagnosis not present

## 2022-03-18 DIAGNOSIS — D500 Iron deficiency anemia secondary to blood loss (chronic): Secondary | ICD-10-CM | POA: Diagnosis not present

## 2022-03-18 DIAGNOSIS — E860 Dehydration: Secondary | ICD-10-CM | POA: Diagnosis not present

## 2022-03-18 DIAGNOSIS — Z510 Encounter for antineoplastic radiation therapy: Secondary | ICD-10-CM | POA: Diagnosis not present

## 2022-03-18 LAB — RAD ONC ARIA SESSION SUMMARY
Course Elapsed Days: 31
Plan Fractions Treated to Date: 23
Plan Prescribed Dose Per Fraction: 1.8 Gy
Plan Total Fractions Prescribed: 25
Plan Total Prescribed Dose: 45 Gy
Reference Point Dosage Given to Date: 41.4 Gy
Reference Point Session Dosage Given: 1.8 Gy
Session Number: 23

## 2022-03-18 NOTE — Telephone Encounter (Signed)
Spoke with pt via telephone regarding how many pills of Xeloda he has on hand at home.  Pt is getting ChemoRT and will finish radiation on 03/26/2022.  Pt said he would count the number of his pills and let this RN know tomorrow when he comes in for Radiation.  This RN notified Marlette and spoke with Tiffany regarding status of refill request sent to Dr. Ernestina Penna office.  Informed Tiffany that once pt notifies Dr. Ernestina Penna office of the number of pills on hand, Dr. Burr Medico will send in a new prescription for the remaining balance needed to complete radiation.  This RN denied refill request at this time.  Tiffany stated she would make a note in the pt's chart regarding the Capecitabine.

## 2022-03-19 ENCOUNTER — Telehealth: Payer: Self-pay

## 2022-03-19 ENCOUNTER — Other Ambulatory Visit: Payer: Self-pay

## 2022-03-19 ENCOUNTER — Ambulatory Visit
Admission: RE | Admit: 2022-03-19 | Discharge: 2022-03-19 | Disposition: A | Discharge status: Home / Self Care | Payer: Medicare Other | Source: Ambulatory Visit | Attending: Radiation Oncology | Admitting: Radiation Oncology

## 2022-03-19 ENCOUNTER — Other Ambulatory Visit (HOSPITAL_COMMUNITY): Payer: Self-pay

## 2022-03-19 DIAGNOSIS — E860 Dehydration: Secondary | ICD-10-CM | POA: Diagnosis not present

## 2022-03-19 DIAGNOSIS — C182 Malignant neoplasm of ascending colon: Secondary | ICD-10-CM | POA: Diagnosis not present

## 2022-03-19 DIAGNOSIS — D500 Iron deficiency anemia secondary to blood loss (chronic): Secondary | ICD-10-CM | POA: Diagnosis not present

## 2022-03-19 DIAGNOSIS — Z510 Encounter for antineoplastic radiation therapy: Secondary | ICD-10-CM | POA: Diagnosis not present

## 2022-03-19 LAB — RAD ONC ARIA SESSION SUMMARY
Course Elapsed Days: 32
Plan Fractions Treated to Date: 24
Plan Prescribed Dose Per Fraction: 1.8 Gy
Plan Total Fractions Prescribed: 25
Plan Total Prescribed Dose: 45 Gy
Reference Point Dosage Given to Date: 43.2 Gy
Reference Point Session Dosage Given: 1.8 Gy
Session Number: 24

## 2022-03-19 NOTE — Telephone Encounter (Signed)
Pt left note with registration stating that he has 45 pills of Xeloda (Capecitabine).  Pt's last day of radiation is 03/26/2022.  Pt will have more than enough pills to cover the remainder of his treatment.  No refill is needed for pt's Capecitabine.

## 2022-03-19 NOTE — Telephone Encounter (Addendum)
Relayed message to patient, patient voiced full understanding.    ----- Message from Alla Feeling, NP sent at 03/18/2022  3:55 PM EST ----- Please have patient take oral K TID. Please review K-rich diet and diarrhea management, if he is having any.  Thanks, Regan Rakers, NP

## 2022-03-22 ENCOUNTER — Ambulatory Visit: Payer: Medicare Other

## 2022-03-22 ENCOUNTER — Ambulatory Visit: Payer: Medicare Other | Admitting: Radiation Oncology

## 2022-03-23 ENCOUNTER — Other Ambulatory Visit: Payer: Self-pay

## 2022-03-23 ENCOUNTER — Ambulatory Visit: Payer: Medicare Other | Admitting: Radiation Oncology

## 2022-03-23 ENCOUNTER — Ambulatory Visit
Admission: RE | Admit: 2022-03-23 | Discharge: 2022-03-23 | Disposition: A | Discharge status: Home / Self Care | Payer: Medicare Other | Source: Ambulatory Visit | Attending: Radiation Oncology | Admitting: Radiation Oncology

## 2022-03-23 ENCOUNTER — Ambulatory Visit: Payer: Medicare Other

## 2022-03-23 DIAGNOSIS — Z510 Encounter for antineoplastic radiation therapy: Secondary | ICD-10-CM | POA: Insufficient documentation

## 2022-03-23 DIAGNOSIS — E860 Dehydration: Secondary | ICD-10-CM | POA: Diagnosis not present

## 2022-03-23 DIAGNOSIS — D6481 Anemia due to antineoplastic chemotherapy: Secondary | ICD-10-CM | POA: Diagnosis not present

## 2022-03-23 DIAGNOSIS — C182 Malignant neoplasm of ascending colon: Secondary | ICD-10-CM | POA: Insufficient documentation

## 2022-03-23 DIAGNOSIS — D500 Iron deficiency anemia secondary to blood loss (chronic): Secondary | ICD-10-CM | POA: Insufficient documentation

## 2022-03-23 LAB — RAD ONC ARIA SESSION SUMMARY
Course Elapsed Days: 36
Plan Fractions Treated to Date: 25
Plan Prescribed Dose Per Fraction: 1.8 Gy
Plan Total Fractions Prescribed: 25
Plan Total Prescribed Dose: 45 Gy
Reference Point Dosage Given to Date: 45 Gy
Reference Point Session Dosage Given: 1.8 Gy
Session Number: 25

## 2022-03-23 NOTE — Progress Notes (Unsigned)
Joseph Pennington   [phone] [phone]   Clinic Follow up Note   Patient Care Team: Shirline Frees, MD as PCP - General (Family Medicine) Truitt Merle, MD as Consulting Physician (Hematology and Oncology)  Date of Service:  03/24/2022  CHIEF COMPLAINT: f/u of colon cancer   CURRENT THERAPY:  concurrent chemoRT with Xeloda  ASSESSMENT:  Joseph Pennington is a 89 y.o. 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 Dema Severin 09/14/21 -he completed 6 cycles adjuvant FOLFOX 10/22/21 - 01/13/22.  -he will complete concurrent chemoRT with Xeloda this Friday  -I discussed the risk of cancer recurrence in the future. I discussed the surveillance plan, which is a physical exam and lab test (including CBC, CMP and CEA) every 3 months for the first 2 years, then every 6-12 months, colonoscopy in one year, and surveilliance CT scan every 6-12 month for up to 5 year.   Diarrhea, fatigue and dyspnea on exertion  -related to chemoRT  -we reviewed management    Anemia -worsening, secondary to oral chemo -will hold blood transfusion for now, but he knows to call me if more symptomatic   PLAN: -lab reviewed -Discuss symptom management, I called in lomotil  -lab,f/u in 3wks   SUMMARY OF ONCOLOGIC HISTORY: Oncology History  Ascending colon cancer s/p lap colectomy 09/14/2021  07/23/2021 Procedure   Upper GI Endoscopy/Colonoscopy; Dr. Lorenso Courier  Colonoscopy Impression: - Likely malignant partially obstructing tumor in the ascending colon. Biopsied. - Six 3 to 8 mm polyps in the rectum and in the transverse colon, removed with a cold snare. Resected and retrieved. - Non-bleeding internal hemorrhoids.  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 Biopsied. - Multiple  gastric polyps. Resected and retrieved. - Erythematous mucosa in the antrum. Biopsied. - Mucosal nodule found in the duodenum. Biopsied. - Dilated lacteals were found in the duodenum. Biopsied. - Two non-bleeding angioectasias in the duodenum.   07/23/2021 Initial Biopsy   Diagnosis 1. Duodenum, Biopsy - BENIGN DUODENAL MUCOSA - NO ACUTE INFLAMMATION, VILLOUS BLUNTING OR INCREASED INTRAEPITHELIAL LYMPHOCYTES IDENTIFIED 2. Duodenum, Biopsy, Nodule - PEPTIC DUODENITIS - NO DYSPLASIA OR MALIGNANCY IDENTIFIED 3. Stomach, biopsy - MILD CHRONIC GASTRITIS WITHOUT ACTIVITY - NO INTESTINAL METAPLASIA IDENTIFIED - SEE COMMENT 4. Stomach, polyp(s) - HYPERPLASTIC GASTRIC POLYP(S) WITH INTESTINAL METAPLASIA - NO H. PYLORI OR MALIGNANCY IDENTIFIED 5. Esophagogastric junction, biopsy - SQUAMOCOLUMNAR JUNCTION WITH CHRONIC INFLAMMATION - NO INTESTINAL METAPLASIA, DYSPLASIA OR MALIGNANCY IDENTIFIED 6. Rectum, biopsy, and transverse - MULTIPLE FRAGMENTS OF HYPERPLASTIC POLYP(S) - NO HIGH-GRADE DYSPLASIA OR MALIGNANCY IDENTIFIED 7. Ascending Colon Biopsy, Mass - POORLY DIFFERENTIATED ADENOCARCINOMA - SEE COMMENT  Microscopic Comment 7. By immunohistochemistry, the neoplastic cells are positive for CDX2 (patchy, weak) but negative for p63, p40, CD56, chromogranin and synaptophysin. This immunoprofile is consistent with a poorly differentiated adenocarcinoma.  3. H. pylori immunohistochemistry is POSITIVE for RARE microorganisms.   07/30/2021 Imaging   EXAM: CT CHEST, ABDOMEN, AND PELVIS WITH CONTRAST  IMPRESSION: 1. Masslike region in the ascending colon extending to the level of the hepatic flexure in the right lower quadrant, concerning for malignancy. Associated pericolonic fat stranding and a few prominent lymph nodes are seen adjacent to this segment of colon, suspicious for local infiltration. No distant metastasis is seen. 2. Left inguinal hernia containing nonobstructed colon. 3.  Nonspecific small ground-glass  opacity in the in the right lower lobe. Attention on follow-up is recommended. 4. Small hiatal hernia. 5. Aortic atherosclerosis and coronary artery calcifications   08/06/2021 Initial Diagnosis   Malignant neoplasm of ascending colon [facility])   Primary adenocarcinoma of ascending colon ([place])  07/23/2021 Procedure   Colonoscopy by Dr. Lorenso Courier - A frond-like/villous, fungating and ulcerated partially obstructing large mass was found in the ascending colon. There was a point at which the mass could not be bypassed, even with use of an ultraslim colonoscope. It is suspected that this mass encompasses the entire cecum and ascending colon. The mass was circumferential. The mass measured fifteen cm in length. Oozing was present.    07/23/2021 Initial Biopsy   3. H. pylori immunohistochemistry is POSITIVE for RARE microorganisms.  FINAL DIAGNOSIS Diagnosis 1. Duodenum, Biopsy - BENIGN DUODENAL MUCOSA - NO ACUTE INFLAMMATION, VILLOUS BLUNTING OR INCREASED INTRAEPITHELIAL LYMPHOCYTES IDENTIFIED 2. Duodenum, Biopsy, Nodule - PEPTIC DUODENITIS - NO DYSPLASIA OR MALIGNANCY IDENTIFIED 3. Stomach, biopsy - MILD CHRONIC GASTRITIS WITHOUT ACTIVITY - NO INTESTINAL METAPLASIA IDENTIFIED - SEE COMMENT 4. Stomach, polyp(s) - HYPERPLASTIC GASTRIC POLYP(S) WITH INTESTINAL METAPLASIA - NO H. PYLORI OR MALIGNANCY IDENTIFIED 5. Esophagogastric junction, biopsy - SQUAMOCOLUMNAR JUNCTION WITH CHRONIC INFLAMMATION - NO INTESTINAL METAPLASIA, DYSPLASIA OR MALIGNANCY IDENTIFIED 6. Rectum, biopsy, and transverse - MULTIPLE FRAGMENTS OF HYPERPLASTIC POLYP(S) - NO HIGH-GRADE DYSPLASIA OR MALIGNANCY IDENTIFIED 7. Ascending Colon Biopsy, Mass - POORLY DIFFERENTIATED ADENOCARCINOMA - SEE COMMENT   07/30/2021 Imaging   CT CAP IMPRESSION: 1. Masslike region in the ascending colon extending to the level of the hepatic flexure in the right lower quadrant, concerning for malignancy.  Associated pericolonic fat stranding and a few prominent lymph nodes are seen adjacent to this segment of colon, suspicious for local infiltration. No distant metastasis is seen. 2. Left inguinal hernia containing nonobstructed colon. 3. Nonspecific small ground-glass opacity in the in the right lower lobe. Attention on follow-up is recommended. 4. Small hiatal hernia. 5. Aortic atherosclerosis and coronary artery calcifications   09/14/2021 Surgery   PROCEDURE:  Laparoscopic converted to open right hemicolectomy Repair of small bowel serosa x 2     09/14/2021 Pathology Results   A. COLON, RIGHT, HEMI COLECTOMY:  Poorly differentiated adenocarcinoma in the ascending colon with clear margins of resection.  Tumor Size: Circumferential measuring 9.0 cm X 8.0 cm. X 2.5 cm.  Macroscopic Tumor Perforation: Not identified  Histologic Type: Adenocarcinoma.  Histologic Grade: Grade 3, poorly differentiated.  Multiple Primary Sites: Not applicable  Tumor Extension: Extends through the muscular wall into the pericolonic adipose tissue.  Lymphovascular Invasion: Not identified.  Perineural Invasion: Not identified.  Treatment Effect: No known presurgical therapy.  Margins:       Margin Status for Invasive Carcinoma: All margins are negative for invasive carcinoma       Distance from Invasive Carcinoma to proximal margin: 11 cm.       Distance from invasive carcinoma to distal margin: 12 cm.  Regional Lymph Nodes:       Number of Lymph Nodes with Tumor: None.       Number of Lymph Nodes Examined: Twenty-two (22)  Tumor Deposits: Present (slide A11)  Distant Metastasis:       Distant Site(s) Involved: None known.  Pathologic Stage Classification (pTNM, AJCC 8th Edition): pT3 pN1c  Comments: Omentum is free of neoplastic invasion.                      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[facility])    09/14/2021 Cancer Staging   Staging form: Colon and Rectum, AJCC 8th Edition - Pathologic stage  from 09/14/2021: Stage IIIB (pT3, pN1c, cM0) - Signed by Alla Feeling, NP on 10/08/2021 Stage prefix: Initial diagnosis Total positive nodes: 0 Histologic grading system: 4 grade system Histologic grade (G): G3 Laterality: Right Lymph-vascular invasion (LVI): LVI not present (absent)/not identified Tumor deposits (TD): Present Perineural invasion (PNI): Absent Microsatellite instability (MSI): Unstable high BRAF mutation: Positive   10/08/2021 Initial Diagnosis   Primary adenocarcinoma of ascending colon ([place])   10/22/2021 - 11/07/2021 Chemotherapy   Patient is on Treatment Plan : COLORECTAL FOLFOX q14d x 3 months     10/22/2021 -  Chemotherapy   Patient is on Treatment Plan : COLORECTAL FOLFOX q14d x 3 months        INTERVAL HISTORY:  Joseph Pennington is here for a follow up of colon cancer  He was last seen by me on 03/03/22 He presents to the clinic by family member. Pt states he is having diarrhea, about 5- 6 times a day. He takes the imodium takes about 6 tablets a day. Pt denies having blood in stools. Pt reports of numbness /swelling In feet. Pt also said he is having mild fatigue. He is able to to walk around the house but he gets tired and some SOB.   All other systems were reviewed with the patient and are negative.  MEDICAL HISTORY:  Past Medical History:  Diagnosis Date   Acute cholecystitis 02/19/2018   Anemia    Chronic kidney disease    Colon cancer ([place])    Diabetes mellitus without complication (HCC)    diet controlled   GERD (gastroesophageal reflux disease)    History of blood transfusion 08/2021   History of hiatal hernia    Hypertension    Hypothyroidism    Iron deficiency anemia    Peripheral neuropathy    bilateral feet    SURGICAL HISTORY: Past Surgical History:  Procedure Laterality Date   CHOLECYSTECTOMY N/A 02/20/2018   Procedure: LAPAROSCOPIC CHOLECYSTECTOMY;  Surgeon: Excell Seltzer, MD;  Location: [facility];  Service: General;   Laterality: N/A;   COLONOSCOPY  2023   LAPAROSCOPIC RIGHT HEMI COLECTOMY Right 09/14/2021   Procedure: LAPAROSCOPIC TO OPEN RIGHT HEMI COLECTOMY;  Surgeon: Ileana Roup, MD;  Location: [facility];  Service: General;  Laterality: Right;   PORTACATH PLACEMENT N/A 10/15/2021   Procedure: INSERTION PORT-A-CATH;  Surgeon: Ileana Roup, MD;  Location: [facility];  Service: General;  Laterality: N/A;  with ultrasound and flouroscopic guidance    I have reviewed the social history and family history with the patient and they are unchanged from previous note.  ALLERGIES:  has No Known Allergies.  MEDICATIONS:  Current Outpatient Medications  Medication Sig Dispense Refill   diphenoxylate-atropine (LOMOTIL) 2.5-0.025 MG tablet Take 1-2 tablets by mouth 4 (four) times daily as needed for diarrhea or loose stools. 30 tablet 0   acetaminophen (TYLENOL) 500 MG tablet Take 500-1,000 mg by mouth every 6 (six) hours as needed for moderate pain.     ARTIFICIAL TEAR SOLUTION OP Place 1 drop into both eyes daily as needed (dry eyes).     capecitabine (XELODA) 500 MG tablet Take 3 tablets (1500 mg) by mouth every 12 hours. Take within 30 minutes after meals. Take only on days of radiation, Monday through Friday. 90 tablet 1   cyanocobalamin (,VITAMIN B-12,) 1000 MCG/ML injection Inject 1,000 mcg into  the muscle every 30 (thirty) days.   0   Iron, Ferrous Sulfate, 325 (65 Fe) MG TABS Take 325 mg by mouth daily.     levothyroxine (SYNTHROID) 75 MCG tablet Take 75 mcg by mouth daily before breakfast.     lisinopril (PRINIVIL,ZESTRIL) 20 MG tablet Take 20 mg by mouth daily.  3   ondansetron (ZOFRAN) 8 MG tablet Take 1 tablet (8 mg total) by mouth every 8 (eight) hours as needed for nausea or vomiting. 90 tablet 0   polyethylene glycol powder (GLYCOLAX/MIRALAX) 17 GM/SCOOP powder Take 17 g by mouth as needed for mild constipation. (Patient not taking: Reported on 01/07/2022)     potassium chloride SA (KLOR-CON  M) 20 MEQ tablet Take 1 tablet (20 mEq total) by mouth 2 (two) times daily. One po bid x 3 days, then one po once a day 60 tablet 0   vitamin C (ASCORBIC ACID) 500 MG tablet Take 500 mg by mouth daily.     No current facility-administered medications for this visit.    PHYSICAL EXAMINATION: ECOG PERFORMANCE STATUS: 2 - Symptomatic, <50% confined to bed  Vitals:   03/24/22 1413  BP: 115/64  Pulse: 96  Resp: 18  Temp: 97.6 F (36.4 C)   Wt Readings from Last 3 Encounters:  03/24/22 196 lb 11.2 oz (89.2 kg)  02/16/22 204 lb 11.2 oz (92.9 kg)  01/29/22 200 lb 9.6 oz (91 kg)     GENERAL:alert, no distress and comfortable SKIN: skin color normal, no rashes or significant lesions EYES: normal, Conjunctiva are pink and non-injected, sclera clear  NEURO: alert & oriented x 3 with fluent speech  LABORATORY DATA:  I have reviewed the data as listed    Latest Ref Rng & Units 03/24/2022    1:56 PM 03/17/2022    2:15 PM 03/10/2022    2:19 PM  CBC  WBC 4.0 - 10.5 K/uL 4.4  5.3  3.7   Hemoglobin 13.0 - 17.0 g/dL 8.3  8.8  9.3   Hematocrit 39.0 - 52.0 % 22.7  24.2  25.9   Platelets 150 - 400 K/uL 157  141  114         Latest Ref Rng & Units 03/24/2022    1:56 PM 03/17/2022    2:15 PM 03/10/2022    2:19 PM  CMP  Glucose 70 - 99 mg/dL 127  130  172   BUN 8 - 23 mg/dL _0 Creatinine 0.61 - 1.24 mg/dL 0.98  0.96  1.11   Sodium 135 - 145 mmol/L 134  136  135   Potassium 3.5 - 5.1 mmol/L 3.6  3.2  3.0   Chloride 98 - 111 mmol/L 103  107  106   CO2 22 - 32 mmol/L _1 Calcium 8.9 - 10.3 mg/dL 8.2  8.6  8.5   Total Protein 6.5 - 8.1 g/dL 5.9  5.9  6.4   Total Bilirubin 0.3 - 1.2 mg/dL 0.8  1.0  1.3   Alkaline Phos 38 - 126 U/L 66  66  68   AST 15 - 41 U/L _2 ALT 0 - 44 U/L _3 RADIOGRAPHIC STUDIES: I have personally reviewed the radiological images as listed and agreed with the findings in the report. No results found.    No orders of  the defined types were placed  in this encounter.  All questions were answered. The patient knows to call the clinic with any problems, questions or concerns. No barriers to learning was detected. The total time spent in the appointment was 30 minutes.     Truitt Merle, MD 03/24/2022   Felicity Coyer, CMA, am acting as scribe for Truitt Merle, MD.   I have reviewed the above documentation for accuracy and completeness, and I agree with the above.

## 2022-03-23 NOTE — Assessment & Plan Note (Signed)
pT3pN1cM0 stage IIIB, loss of MLH1 and PMS2, BRAF V600E+, MLH1 hypermethylation  -diagnosed 07/2021  -s/p open right colectomy by Dr. Dema Severin 09/14/21 -he completed 6 cycles adjuvant FOLFOX 10/22/21 - 01/13/22.  -he will complete concurrent chemoRT with Xeloda this Friday  -I discussed the risk of cancer recurrence in the future. I discussed the surveillance plan, which is a physical exam and lab test (including CBC, CMP and CEA) every 3 months for the first 2 years, then every 6-12 months, colonoscopy in one year, and surveilliance CT scan every 6-12 month for up to 5 year.

## 2022-03-24 ENCOUNTER — Inpatient Hospital Stay: Payer: Medicare Other

## 2022-03-24 ENCOUNTER — Encounter: Payer: Self-pay | Admitting: Hematology

## 2022-03-24 ENCOUNTER — Ambulatory Visit
Admission: RE | Admit: 2022-03-24 | Discharge: 2022-03-24 | Disposition: A | Discharge status: Home / Self Care | Payer: Medicare Other | Source: Ambulatory Visit | Attending: Radiation Oncology | Admitting: Radiation Oncology

## 2022-03-24 ENCOUNTER — Ambulatory Visit: Payer: Medicare Other

## 2022-03-24 ENCOUNTER — Other Ambulatory Visit: Payer: Self-pay

## 2022-03-24 ENCOUNTER — Inpatient Hospital Stay: Payer: Medicare Other | Attending: Nurse Practitioner | Admitting: Hematology

## 2022-03-24 VITALS — BP 115/64 | HR 96 | Temp 97.6°F | Resp 18 | Ht 73.0 in | Wt 196.7 lb

## 2022-03-24 DIAGNOSIS — E039 Hypothyroidism, unspecified: Secondary | ICD-10-CM | POA: Insufficient documentation

## 2022-03-24 DIAGNOSIS — R0609 Other forms of dyspnea: Secondary | ICD-10-CM | POA: Insufficient documentation

## 2022-03-24 DIAGNOSIS — E860 Dehydration: Secondary | ICD-10-CM

## 2022-03-24 DIAGNOSIS — Z452 Encounter for adjustment and management of vascular access device: Secondary | ICD-10-CM | POA: Insufficient documentation

## 2022-03-24 DIAGNOSIS — Z9221 Personal history of antineoplastic chemotherapy: Secondary | ICD-10-CM | POA: Insufficient documentation

## 2022-03-24 DIAGNOSIS — D500 Iron deficiency anemia secondary to blood loss (chronic): Secondary | ICD-10-CM

## 2022-03-24 DIAGNOSIS — C182 Malignant neoplasm of ascending colon: Secondary | ICD-10-CM

## 2022-03-24 DIAGNOSIS — R5383 Other fatigue: Secondary | ICD-10-CM | POA: Insufficient documentation

## 2022-03-24 DIAGNOSIS — D6481 Anemia due to antineoplastic chemotherapy: Secondary | ICD-10-CM | POA: Diagnosis not present

## 2022-03-24 DIAGNOSIS — R197 Diarrhea, unspecified: Secondary | ICD-10-CM | POA: Insufficient documentation

## 2022-03-24 DIAGNOSIS — Z9049 Acquired absence of other specified parts of digestive tract: Secondary | ICD-10-CM | POA: Insufficient documentation

## 2022-03-24 DIAGNOSIS — Z510 Encounter for antineoplastic radiation therapy: Secondary | ICD-10-CM | POA: Diagnosis not present

## 2022-03-24 DIAGNOSIS — Z79899 Other long term (current) drug therapy: Secondary | ICD-10-CM | POA: Insufficient documentation

## 2022-03-24 DIAGNOSIS — D649 Anemia, unspecified: Secondary | ICD-10-CM | POA: Insufficient documentation

## 2022-03-24 DIAGNOSIS — Z923 Personal history of irradiation: Secondary | ICD-10-CM | POA: Insufficient documentation

## 2022-03-24 LAB — CMP (CANCER CENTER ONLY)
ALT: 10 U/L (ref 0–44)
AST: 21 U/L (ref 15–41)
Albumin: 2.6 g/dL — ABNORMAL LOW (ref 3.5–5.0)
Alkaline Phosphatase: 66 U/L (ref 38–126)
Anion gap: 8 (ref 5–15)
BUN: 17 mg/dL (ref 8–23)
CO2: 23 mmol/L (ref 22–32)
Calcium: 8.2 mg/dL — ABNORMAL LOW (ref 8.9–10.3)
Chloride: 103 mmol/L (ref 98–111)
Creatinine: 0.98 mg/dL (ref 0.61–1.24)
GFR, Estimated: >60 mL/min (ref >60)
Glucose, Bld: 127 mg/dL — ABNORMAL HIGH (ref 70–99)
Potassium: 3.6 mmol/L (ref 3.5–5.1)
Sodium: 134 mmol/L — ABNORMAL LOW (ref 135–145)
Total Bilirubin: 0.8 mg/dL (ref 0.3–1.2)
Total Protein: 5.9 g/dL — ABNORMAL LOW (ref 6.5–8.1)

## 2022-03-24 LAB — RAD ONC ARIA SESSION SUMMARY
Course Elapsed Days: 37
Plan Fractions Treated to Date: 1
Plan Prescribed Dose Per Fraction: 1.8 Gy
Plan Total Fractions Prescribed: 3
Plan Total Prescribed Dose: 5.4 Gy
Reference Point Dosage Given to Date: 1.8 Gy
Reference Point Session Dosage Given: 1.8 Gy
Session Number: 26

## 2022-03-24 LAB — CBC WITH DIFFERENTIAL (CANCER CENTER ONLY)
Abs Immature Granulocytes: 0.02 10*3/uL (ref 0.00–0.07)
Basophils Absolute: 0 10*3/uL (ref 0.0–0.1)
Basophils Relative: 0 %
Eosinophils Absolute: 0 10*3/uL (ref 0.0–0.5)
Eosinophils Relative: 1 %
HCT: 22.7 % — ABNORMAL LOW (ref 39.0–52.0)
Hemoglobin: 8.3 g/dL — ABNORMAL LOW (ref 13.0–17.0)
Immature Granulocytes: 1 %
Lymphocytes Relative: 5 %
Lymphs Abs: 0.2 10*3/uL — ABNORMAL LOW (ref 0.7–4.0)
MCH: 36.1 pg — ABNORMAL HIGH (ref 26.0–34.0)
MCHC: 36.6 g/dL — ABNORMAL HIGH (ref 30.0–36.0)
MCV: 98.7 fL (ref 80.0–100.0)
Monocytes Absolute: 0.5 10*3/uL (ref 0.1–1.0)
Monocytes Relative: 11 %
Neutro Abs: 3.7 10*3/uL (ref 1.7–7.7)
Neutrophils Relative %: 82 %
Platelet Count: 157 10*3/uL (ref 150–400)
RBC: 2.3 MIL/uL — ABNORMAL LOW (ref 4.22–5.81)
RDW: 15.3 % (ref 11.5–15.5)
WBC Count: 4.4 10*3/uL (ref 4.0–10.5)
nRBC: 0 % (ref 0.0–0.2)

## 2022-03-24 MED ORDER — HEPARIN SOD (PORK) LOCK FLUSH 100 UNIT/ML IV SOLN
500.0000 [IU] | Freq: Once | INTRAVENOUS | Status: AC
  Administered 2022-03-24: 500 [IU]

## 2022-03-24 MED ORDER — SODIUM CHLORIDE 0.9% FLUSH
10.0000 mL | Freq: Once | INTRAVENOUS | Status: AC
  Administered 2022-03-24: 10 mL

## 2022-03-24 MED ORDER — DIPHENOXYLATE-ATROPINE 2.5-0.025 MG PO TABS: 1.0000 | ORAL_TABLET | Freq: Four times a day (QID) | ORAL | 0 refills | Status: DC | PRN

## 2022-03-25 ENCOUNTER — Telehealth: Payer: Self-pay | Admitting: Hematology

## 2022-03-25 ENCOUNTER — Ambulatory Visit: Payer: Medicare Other

## 2022-03-25 ENCOUNTER — Ambulatory Visit
Admission: RE | Admit: 2022-03-25 | Discharge: 2022-03-25 | Disposition: A | Discharge status: Home / Self Care | Payer: Medicare Other | Source: Ambulatory Visit | Attending: Radiation Oncology | Admitting: Radiation Oncology

## 2022-03-25 ENCOUNTER — Other Ambulatory Visit: Payer: Self-pay

## 2022-03-25 DIAGNOSIS — C182 Malignant neoplasm of ascending colon: Secondary | ICD-10-CM | POA: Diagnosis not present

## 2022-03-25 DIAGNOSIS — D6481 Anemia due to antineoplastic chemotherapy: Secondary | ICD-10-CM | POA: Diagnosis not present

## 2022-03-25 DIAGNOSIS — Z510 Encounter for antineoplastic radiation therapy: Secondary | ICD-10-CM | POA: Diagnosis not present

## 2022-03-25 DIAGNOSIS — E860 Dehydration: Secondary | ICD-10-CM | POA: Diagnosis not present

## 2022-03-25 LAB — RAD ONC ARIA SESSION SUMMARY
Course Elapsed Days: 38
Plan Fractions Treated to Date: 2
Plan Prescribed Dose Per Fraction: 1.8 Gy
Plan Total Fractions Prescribed: 3
Plan Total Prescribed Dose: 5.4 Gy
Reference Point Dosage Given to Date: 3.6 Gy
Reference Point Session Dosage Given: 1.8 Gy
Session Number: 27

## 2022-03-25 NOTE — Telephone Encounter (Signed)
Spoke with patient wife confirming upcoming appointments

## 2022-03-26 ENCOUNTER — Ambulatory Visit
Admission: RE | Admit: 2022-03-26 | Discharge: 2022-03-26 | Disposition: A | Discharge status: Home / Self Care | Payer: Medicare Other | Source: Ambulatory Visit | Attending: Radiation Oncology | Admitting: Radiation Oncology

## 2022-03-26 ENCOUNTER — Other Ambulatory Visit: Payer: Self-pay

## 2022-03-26 ENCOUNTER — Other Ambulatory Visit (HOSPITAL_COMMUNITY): Payer: Self-pay

## 2022-03-26 ENCOUNTER — Encounter: Payer: Self-pay | Admitting: Radiation Oncology

## 2022-03-26 DIAGNOSIS — D6481 Anemia due to antineoplastic chemotherapy: Secondary | ICD-10-CM | POA: Diagnosis not present

## 2022-03-26 DIAGNOSIS — Z510 Encounter for antineoplastic radiation therapy: Secondary | ICD-10-CM | POA: Diagnosis not present

## 2022-03-26 DIAGNOSIS — C182 Malignant neoplasm of ascending colon: Secondary | ICD-10-CM | POA: Diagnosis not present

## 2022-03-26 DIAGNOSIS — E860 Dehydration: Secondary | ICD-10-CM | POA: Diagnosis not present

## 2022-03-26 LAB — RAD ONC ARIA SESSION SUMMARY
Course Elapsed Days: 39
Plan Fractions Treated to Date: 3
Plan Prescribed Dose Per Fraction: 1.8 Gy
Plan Total Fractions Prescribed: 3
Plan Total Prescribed Dose: 5.4 Gy
Reference Point Dosage Given to Date: 5.4 Gy
Reference Point Session Dosage Given: 1.8 Gy
Session Number: 28

## 2022-03-30 NOTE — Progress Notes (Signed)
                                                                                                                                                             Patient Name: Joseph Pennington MRN: [id number] DOB: [birth year] Referring Physician: Ileana Roup Date of Service: 03/26/2022 [facility],                                                         End Of Treatment Note  Diagnoses: C18.2-Malignant neoplasm of ascending colon  Cancer Staging:  pT3N1cM0, poorly differentiated adenocarcinoma of the ascending colon with local involvement at the time of surgery along the right kidney and duodenum   Intent: Curative  Radiation Treatment Dates: 02/15/2022 through 03/26/2022 Site Technique Total Dose (Gy) Dose per Fx (Gy) Completed Fx Beam Energies  Abdomen: Abd 3D 45/45 1.8 25/25 6X, 10X, 15X  Abdomen: Abd_Bst 3D 5.4/5.4 1.8 3/3 6X, 10X, 15X   Narrative: The patient tolerated radiation therapy relatively well. He did develop some abdominal discomfort and loose stool during therapy.  Plan: The patient will receive a call in about one month from the radiation oncology department. He will continue follow up with Dr. Burr Medico as well as with Dr. Dema Severin in colorectal surgery.   ________________________________________________    Carola Rhine, PAC

## 2022-04-02 ENCOUNTER — Other Ambulatory Visit (HOSPITAL_COMMUNITY): Payer: Self-pay

## 2022-04-07 DIAGNOSIS — C182 Malignant neoplasm of ascending colon: Secondary | ICD-10-CM | POA: Diagnosis not present

## 2022-04-16 ENCOUNTER — Encounter: Payer: Self-pay | Admitting: Hematology

## 2022-04-16 ENCOUNTER — Inpatient Hospital Stay: Payer: Medicare Other

## 2022-04-16 ENCOUNTER — Inpatient Hospital Stay (HOSPITAL_BASED_OUTPATIENT_CLINIC_OR_DEPARTMENT_OTHER): Payer: Medicare Other | Admitting: Hematology

## 2022-04-16 ENCOUNTER — Other Ambulatory Visit: Payer: Self-pay

## 2022-04-16 ENCOUNTER — Telehealth: Payer: Self-pay | Admitting: Hematology

## 2022-04-16 VITALS — BP 119/66 | HR 86 | Temp 97.8°F | Resp 20 | Wt 192.8 lb

## 2022-04-16 DIAGNOSIS — Z9049 Acquired absence of other specified parts of digestive tract: Secondary | ICD-10-CM | POA: Diagnosis not present

## 2022-04-16 DIAGNOSIS — R5383 Other fatigue: Secondary | ICD-10-CM | POA: Insufficient documentation

## 2022-04-16 DIAGNOSIS — Z452 Encounter for adjustment and management of vascular access device: Secondary | ICD-10-CM | POA: Diagnosis not present

## 2022-04-16 DIAGNOSIS — D649 Anemia, unspecified: Secondary | ICD-10-CM | POA: Diagnosis not present

## 2022-04-16 DIAGNOSIS — Z923 Personal history of irradiation: Secondary | ICD-10-CM | POA: Diagnosis not present

## 2022-04-16 DIAGNOSIS — E039 Hypothyroidism, unspecified: Secondary | ICD-10-CM | POA: Insufficient documentation

## 2022-04-16 DIAGNOSIS — C182 Malignant neoplasm of ascending colon: Secondary | ICD-10-CM | POA: Insufficient documentation

## 2022-04-16 DIAGNOSIS — Z79899 Other long term (current) drug therapy: Secondary | ICD-10-CM | POA: Diagnosis not present

## 2022-04-16 DIAGNOSIS — R0609 Other forms of dyspnea: Secondary | ICD-10-CM | POA: Diagnosis not present

## 2022-04-16 DIAGNOSIS — Z9221 Personal history of antineoplastic chemotherapy: Secondary | ICD-10-CM | POA: Diagnosis not present

## 2022-04-16 DIAGNOSIS — Z95828 Presence of other vascular implants and grafts: Secondary | ICD-10-CM

## 2022-04-16 DIAGNOSIS — R197 Diarrhea, unspecified: Secondary | ICD-10-CM | POA: Diagnosis not present

## 2022-04-16 LAB — CBC WITH DIFFERENTIAL (CANCER CENTER ONLY)
Abs Immature Granulocytes: 0.01 10*3/uL (ref 0.00–0.07)
Basophils Absolute: 0 10*3/uL (ref 0.0–0.1)
Basophils Relative: 0 %
Eosinophils Absolute: 0 10*3/uL (ref 0.0–0.5)
Eosinophils Relative: 1 %
HCT: 22.8 % — ABNORMAL LOW (ref 39.0–52.0)
Hemoglobin: 7.9 g/dL — ABNORMAL LOW (ref 13.0–17.0)
Immature Granulocytes: 0 %
Lymphocytes Relative: 5 %
Lymphs Abs: 0.3 10*3/uL — ABNORMAL LOW (ref 0.7–4.0)
MCH: 35.4 pg — ABNORMAL HIGH (ref 26.0–34.0)
MCHC: 34.6 g/dL (ref 30.0–36.0)
MCV: 102.2 fL — ABNORMAL HIGH (ref 80.0–100.0)
Monocytes Absolute: 0.3 10*3/uL (ref 0.1–1.0)
Monocytes Relative: 7 %
Neutro Abs: 4.3 10*3/uL (ref 1.7–7.7)
Neutrophils Relative %: 87 %
Platelet Count: 172 10*3/uL (ref 150–400)
RBC: 2.23 MIL/uL — ABNORMAL LOW (ref 4.22–5.81)
RDW: 15.2 % (ref 11.5–15.5)
WBC Count: 4.9 10*3/uL (ref 4.0–10.5)
nRBC: 0 % (ref 0.0–0.2)

## 2022-04-16 LAB — CMP (CANCER CENTER ONLY)
ALT: 9 U/L (ref 0–44)
AST: 15 U/L (ref 15–41)
Albumin: 2.7 g/dL — ABNORMAL LOW (ref 3.5–5.0)
Alkaline Phosphatase: 84 U/L (ref 38–126)
Anion gap: 4 — ABNORMAL LOW (ref 5–15)
BUN: 19 mg/dL (ref 8–23)
CO2: 27 mmol/L (ref 22–32)
Calcium: 8.6 mg/dL — ABNORMAL LOW (ref 8.9–10.3)
Chloride: 105 mmol/L (ref 98–111)
Creatinine: 1.02 mg/dL (ref 0.61–1.24)
GFR, Estimated: >60 mL/min (ref >60)
Glucose, Bld: 130 mg/dL — ABNORMAL HIGH (ref 70–99)
Potassium: 3.8 mmol/L (ref 3.5–5.1)
Sodium: 136 mmol/L (ref 135–145)
Total Bilirubin: 0.4 mg/dL (ref 0.3–1.2)
Total Protein: 5.5 g/dL — ABNORMAL LOW (ref 6.5–8.1)

## 2022-04-16 LAB — IRON AND IRON BINDING CAPACITY (CC-WL,HP ONLY)
Iron: 79 ug/dL (ref 45–182)
Saturation Ratios: 36 % (ref 17.9–39.5)
TIBC: 221 ug/dL — ABNORMAL LOW (ref 250–450)
UIBC: 142 ug/dL (ref 117–376)

## 2022-04-16 LAB — CEA (IN HOUSE-CHCC): CEA (CHCC-In House): 1.94 ng/mL (ref 0.00–5.00)

## 2022-04-16 LAB — FERRITIN: Ferritin: 228 ng/mL (ref 24–336)

## 2022-04-16 MED ORDER — HEPARIN SOD (PORK) LOCK FLUSH 100 UNIT/ML IV SOLN
500.0000 [IU] | Freq: Once | INTRAVENOUS | Status: AC
  Administered 2022-04-16: 500 [IU] via INTRAVENOUS

## 2022-04-16 MED ORDER — SODIUM CHLORIDE 0.9% FLUSH
10.0000 mL | INTRAVENOUS | Status: DC | PRN
  Administered 2022-04-16: 10 mL via INTRAVENOUS

## 2022-04-16 NOTE — Patient Instructions (Signed)

## 2022-04-16 NOTE — Assessment & Plan Note (Signed)
pT3pN1cM0 stage IIIB, loss of MLH1 and PMS2, BRAF V600E+, MLH1 hypermethylation  -diagnosed 07/2021  -s/p open right colectomy by Dr. Dema Severin 09/14/21 -he completed 6 cycles adjuvant FOLFOX 10/22/21 - 01/13/22.  -he completed chemoRT with Xeloda on 03/26/2022 -continue, physical exam and lab test (including CBC, CMP and CEA) every 3 months for the first 2 years, then every 6-12 months, colonoscopy in one year, and surveilliance CT scan every 6-12 month for up to 5 year.

## 2022-04-16 NOTE — Telephone Encounter (Signed)
Pt had standing order for stool sample. Pt stated he was not able to give a sample.

## 2022-04-16 NOTE — Progress Notes (Signed)
[facility]   [phone] [phone]   Clinic Follow up Note   Patient Care Team: Shirline Frees, MD as PCP - General (Family Medicine) Truitt Merle, MD as Consulting Physician (Hematology and Oncology)  Date of Service:  04/16/2022  CHIEF COMPLAINT: f/u of colon cancer   CURRENT THERAPY:  Surveillance  ASSESSMENT:  Joseph Pennington is a 89 y.o. 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 Dema Severin 09/14/21 -he completed 6 cycles adjuvant FOLFOX 10/22/21 - 01/13/22.  -he completed chemoRT with Xeloda on 03/26/2022 -continue, physical exam and lab test (including CBC, CMP and CEA) every 3 months for the first 2 years, then every 6-12 months, colonoscopy in one year, and surveilliance CT scan every 6-12 month for up to 5 year.  -He is recovering from chemoradiation slowly, overall as expected due to his advanced age. Lab reviewed, no new concerns.   PLAN: - CT scan ordered for 6 weeks - increase protein intake - pt need to weigh himself every week - if SBP below 100 hold BP meds -f/u in 6 weeks with lab, flush and CT a few days before      SUMMARY OF ONCOLOGIC HISTORY: Oncology History  Ascending colon cancer s/p lap colectomy 09/14/2021  07/23/2021 Procedure   Upper GI Endoscopy/Colonoscopy; Dr. Lorenso Courier  Colonoscopy Impression: - Likely malignant partially obstructing tumor in the ascending colon. Biopsied. - Six 3 to 8 mm polyps in the rectum and in the transverse colon, removed with a cold snare. Resected and retrieved. - Non-bleeding internal hemorrhoids.  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 Biopsied. - Multiple gastric polyps. Resected and retrieved. - Erythematous mucosa in the antrum. Biopsied. - Mucosal nodule found in the  duodenum. Biopsied. - Dilated lacteals were found in the duodenum. Biopsied. - Two non-bleeding angioectasias in the duodenum.   07/23/2021 Initial Biopsy   Diagnosis 1. Duodenum, Biopsy - BENIGN DUODENAL MUCOSA - NO ACUTE INFLAMMATION, VILLOUS BLUNTING OR INCREASED INTRAEPITHELIAL LYMPHOCYTES IDENTIFIED 2. Duodenum, Biopsy, Nodule - PEPTIC DUODENITIS - NO DYSPLASIA OR MALIGNANCY IDENTIFIED 3. Stomach, biopsy - MILD CHRONIC GASTRITIS WITHOUT ACTIVITY - NO INTESTINAL METAPLASIA IDENTIFIED - SEE COMMENT 4. Stomach, polyp(s) - HYPERPLASTIC GASTRIC POLYP(S) WITH INTESTINAL METAPLASIA - NO H. PYLORI OR MALIGNANCY IDENTIFIED 5. Esophagogastric junction, biopsy - SQUAMOCOLUMNAR JUNCTION WITH CHRONIC INFLAMMATION - NO INTESTINAL METAPLASIA, DYSPLASIA OR MALIGNANCY IDENTIFIED 6. Rectum, biopsy, and transverse - MULTIPLE FRAGMENTS OF HYPERPLASTIC POLYP(S) - NO HIGH-GRADE DYSPLASIA OR MALIGNANCY IDENTIFIED 7. Ascending Colon Biopsy, Mass - POORLY DIFFERENTIATED ADENOCARCINOMA - SEE COMMENT  Microscopic Comment 7. By immunohistochemistry, the neoplastic cells are positive for CDX2 (patchy, weak) but negative for p63, p40, CD56, chromogranin and synaptophysin. This immunoprofile is consistent with a poorly differentiated adenocarcinoma.  3. H. pylori immunohistochemistry is POSITIVE for RARE microorganisms.   07/30/2021 Imaging   EXAM: CT CHEST, ABDOMEN, AND PELVIS WITH CONTRAST  IMPRESSION: 1. Masslike region in the ascending colon extending to the level of the hepatic flexure in the right lower quadrant, concerning for malignancy. Associated pericolonic fat stranding and a few prominent lymph nodes are seen adjacent to this segment of colon, suspicious for local infiltration. No distant metastasis is seen. 2. Left inguinal hernia containing nonobstructed colon. 3. Nonspecific small ground-glass opacity in the in the right lower lobe. Attention on follow-up is recommended. 4.  Small  hiatal hernia. 5. Aortic atherosclerosis and coronary artery calcifications   08/06/2021 Initial Diagnosis   Malignant neoplasm of ascending colon [facility])   Primary adenocarcinoma of ascending colon ([place])  07/23/2021 Procedure   Colonoscopy by Dr. Lorenso Courier - A frond-like/villous, fungating and ulcerated partially obstructing large mass was found in the ascending colon. There was a point at which the mass could not be bypassed, even with use of an ultraslim colonoscope. It is suspected that this mass encompasses the entire cecum and ascending colon. The mass was circumferential. The mass measured fifteen cm in length. Oozing was present.    07/23/2021 Initial Biopsy   3. H. pylori immunohistochemistry is POSITIVE for RARE microorganisms.  FINAL DIAGNOSIS Diagnosis 1. Duodenum, Biopsy - BENIGN DUODENAL MUCOSA - NO ACUTE INFLAMMATION, VILLOUS BLUNTING OR INCREASED INTRAEPITHELIAL LYMPHOCYTES IDENTIFIED 2. Duodenum, Biopsy, Nodule - PEPTIC DUODENITIS - NO DYSPLASIA OR MALIGNANCY IDENTIFIED 3. Stomach, biopsy - MILD CHRONIC GASTRITIS WITHOUT ACTIVITY - NO INTESTINAL METAPLASIA IDENTIFIED - SEE COMMENT 4. Stomach, polyp(s) - HYPERPLASTIC GASTRIC POLYP(S) WITH INTESTINAL METAPLASIA - NO H. PYLORI OR MALIGNANCY IDENTIFIED 5. Esophagogastric junction, biopsy - SQUAMOCOLUMNAR JUNCTION WITH CHRONIC INFLAMMATION - NO INTESTINAL METAPLASIA, DYSPLASIA OR MALIGNANCY IDENTIFIED 6. Rectum, biopsy, and transverse - MULTIPLE FRAGMENTS OF HYPERPLASTIC POLYP(S) - NO HIGH-GRADE DYSPLASIA OR MALIGNANCY IDENTIFIED 7. Ascending Colon Biopsy, Mass - POORLY DIFFERENTIATED ADENOCARCINOMA - SEE COMMENT   07/30/2021 Imaging   CT CAP IMPRESSION: 1. Masslike region in the ascending colon extending to the level of the hepatic flexure in the right lower quadrant, concerning for malignancy. Associated pericolonic fat stranding and a few prominent lymph nodes are seen adjacent to this segment of colon, suspicious for  local infiltration. No distant metastasis is seen. 2. Left inguinal hernia containing nonobstructed colon. 3. Nonspecific small ground-glass opacity in the in the right lower lobe. Attention on follow-up is recommended. 4. Small hiatal hernia. 5. Aortic atherosclerosis and coronary artery calcifications   09/14/2021 Surgery   PROCEDURE:  Laparoscopic converted to open right hemicolectomy Repair of small bowel serosa x 2     09/14/2021 Pathology Results   A. COLON, RIGHT, HEMI COLECTOMY:  Poorly differentiated adenocarcinoma in the ascending colon with clear margins of resection.  Tumor Size: Circumferential measuring 9.0 cm X 8.0 cm. X 2.5 cm.  Macroscopic Tumor Perforation: Not identified  Histologic Type: Adenocarcinoma.  Histologic Grade: Grade 3, poorly differentiated.  Multiple Primary Sites: Not applicable  Tumor Extension: Extends through the muscular wall into the pericolonic adipose tissue.  Lymphovascular Invasion: Not identified.  Perineural Invasion: Not identified.  Treatment Effect: No known presurgical therapy.  Margins:       Margin Status for Invasive Carcinoma: All margins are negative for invasive carcinoma       Distance from Invasive Carcinoma to proximal margin: 11 cm.       Distance from invasive carcinoma to distal margin: 12 cm.  Regional Lymph Nodes:       Number of Lymph Nodes with Tumor: None.       Number of Lymph Nodes Examined: Twenty-two (22)  Tumor Deposits: Present (slide A11)  Distant Metastasis:       Distant Site(s) Involved: None known.  Pathologic Stage Classification (pTNM, AJCC 8th Edition): pT3 pN1c  Comments: Omentum is free of neoplastic invasion.                      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LAMN)    09/14/2021 Cancer Staging   Staging form:  Colon and Rectum, AJCC 8th Edition - Pathologic stage from 09/14/2021: Stage IIIB (pT3, pN1c, cM0) - Signed by Alla Feeling, NP on 10/08/2021 Stage prefix: Initial  diagnosis Total positive nodes: 0 Histologic grading system: 4 grade system Histologic grade (G): G3 Laterality: Right Lymph-vascular invasion (LVI): LVI not present (absent)/not identified Tumor deposits (TD): Present Perineural invasion (PNI): Absent Microsatellite instability (MSI): Unstable high BRAF mutation: Positive   10/08/2021 Initial Diagnosis   Primary adenocarcinoma of ascending colon ([place])   10/22/2021 - 11/07/2021 Chemotherapy   Patient is on Treatment Plan : COLORECTAL FOLFOX q14d x 3 months     10/22/2021 -  Chemotherapy   Patient is on Treatment Plan : COLORECTAL FOLFOX q14d x 3 months        INTERVAL HISTORY:  Joseph Pennington is here for a follow up of colon cancer.  He was last seen by Dr. Truitt Merle on 03/24/2022 He presents to the clinic alone. Patient finished chemo last week felt very weak didn't want to get out of bed, energy has improved but still weak, pt states he still has neuropathy in feet, pt is having issues with diarrhea but it is improving.   All other systems were reviewed with the patient and are negative.  MEDICAL HISTORY:  Past Medical History:  Diagnosis Date   Acute cholecystitis 02/19/2018   Anemia    Chronic kidney disease    Colon cancer ([place])    Diabetes mellitus without complication (H[facility])    diet controlled   GERD (gastroesophageal reflux disease)    History of blood transfusion 08/2021   History of hiatal hernia    Hypertension    Hypothyroidism    Iron deficiency anemia    Peripheral neuropathy    bilateral feet    SURGICAL HISTORY: Past Surgical History:  Procedure Laterality Date   CHOLECYSTECTOMY N/A 02/20/2018   Procedure: LAPAROSCOPIC CHOLECYSTECTOMY;  Surgeon: Excell Seltzer, MD;  Location: [facility];  Service: General;  Laterality: N/A;   COLONOSCOPY  2023   LAPAROSCOPIC RIGHT HEMI COLECTOMY Right 09/14/2021   Procedure: LAPAROSCOPIC TO OPEN RIGHT HEMI COLECTOMY;  Surgeon: Ileana Roup, MD;  Location: [facility]  [facility];  Service: General;  Laterality: Right;   PORTACATH PLACEMENT N/A 10/15/2021   Procedure: INSERTION PORT-A-CATH;  Surgeon: Ileana Roup, MD;  Location: [facility];  Service: General;  Laterality: N/A;  with ultrasound and flouroscopic guidance    I have reviewed the social history and family history with the patient and they are unchanged from previous note.  ALLERGIES:  has No Known Allergies.  MEDICATIONS:  Current Outpatient Medications  Medication Sig Dispense Refill   acetaminophen (TYLENOL) 500 MG tablet Take 500-1,000 mg by mouth every 6 (six) hours as needed for moderate pain.     ARTIFICIAL TEAR SOLUTION OP Place 1 drop into both eyes daily as needed (dry eyes).     cyanocobalamin (,VITAMIN B-12,) 1000 MCG/ML injection Inject 1,000 mcg into the muscle every 30 (thirty) days.   0   diphenoxylate-atropine (LOMOTIL) 2.5-0.025 MG tablet Take 1-2 tablets by mouth 4 (four) times daily as needed for diarrhea or loose stools. 30 tablet 0   Iron, Ferrous Sulfate, 325 (65 Fe) MG TABS Take 325 mg by mouth daily.     levothyroxine (SYNTHROID) 75 MCG tablet Take 75 mcg by mouth daily before breakfast.     lisinopril (PRINIVIL,ZESTRIL) 20 MG tablet Take 20 mg by mouth daily.  3   ondansetron (ZOFRAN) 8 MG tablet Take 1  tablet (8 mg total) by mouth every 8 (eight) hours as needed for nausea or vomiting. 90 tablet 0   polyethylene glycol powder (GLYCOLAX/MIRALAX) 17 GM/SCOOP powder Take 17 g by mouth as needed for mild constipation. (Patient not taking: Reported on 01/07/2022)     potassium chloride SA (KLOR-CON M) 20 MEQ tablet Take 1 tablet (20 mEq total) by mouth 2 (two) times daily. One po bid x 3 days, then one po once a day 60 tablet 0   vitamin C (ASCORBIC ACID) 500 MG tablet Take 500 mg by mouth daily.     No current facility-administered medications for this visit.    PHYSICAL EXAMINATION: ECOG PERFORMANCE STATUS: 2 - Symptomatic, <50% confined to bed  Vitals:   04/16/22 1421   BP: 119/66  Pulse: 86  Resp: 20  Temp: 97.8 F (36.6 C)  SpO2: 100%   Wt Readings from Last 3 Encounters:  04/16/22 192 lb 12.8 oz (87.5 kg)  03/24/22 196 lb 11.2 oz (89.2 kg)  02/16/22 204 lb 11.2 oz (92.9 kg)     GENERAL:alert, no distress and comfortable SKIN: skin color, texture, turgor are normal, no rashes or significant lesions EYES: normal, Conjunctiva are pink and non-injected, sclera clear NECK: supple, thyroid normal size, non-tender, without nodularity LYMPH:  no palpable lymphadenopathy in the cervical, axillary  LUNGS: clear to auscultation and percussion with normal breathing effort HEART: regular rate & rhythm and no murmurs and no lower extremity edema ABDOMEN:abdomen soft, non-tender and normal bowel sounds Musculoskeletal:no cyanosis of digits and no clubbing  NEURO: alert & oriented x 3 with fluent speech, no focal motor/sensory deficits  LABORATORY DATA:  I have reviewed the data as listed    Latest Ref Rng & Units 04/16/2022    2:02 PM 03/24/2022    1:56 PM 03/17/2022    2:15 PM  CBC  WBC 4.0 - 10.5 K/uL 4.9  4.4  5.3   Hemoglobin 13.0 - 17.0 g/dL 7.9  8.3  8.8   Hematocrit 39.0 - 52.0 % 22.8  22.7  24.2   Platelets 150 - 400 K/uL 172  157  141         Latest Ref Rng & Units 04/16/2022    2:02 PM 03/24/2022    1:56 PM 03/17/2022    2:15 PM  CMP  Glucose 70 - 99 mg/dL 130  127  130   BUN 8 - 23 mg/dL '19  17  17   '$ Creatinine 0.61 - 1.24 mg/dL 1.02  0.98  0.96   Sodium 135 - 145 mmol/L 136  134  136   Potassium 3.5 - 5.1 mmol/L 3.8  3.6  3.2   Chloride 98 - 111 mmol/L 105  103  107   CO2 22 - 32 mmol/L '27  23  24   '$ Calcium 8.9 - 10.3 mg/dL 8.6  8.2  8.6   Total Protein 6.5 - 8.1 g/dL 5.5  5.9  5.9   Total Bilirubin 0.3 - 1.2 mg/dL 0.4  0.8  1.0   Alkaline Phos 38 - 126 U/L 84  66  66   AST 15 - 41 U/L '15  21  15   '$ ALT 0 - 44 U/L '9  10  9       '$ RADIOGRAPHIC STUDIES: I have personally reviewed the radiological images as listed and agreed  with the findings in the report. No results found.    Orders Placed This Encounter  Procedures   CT CHEST ABDOMEN  PELVIS W CONTRAST    Standing Status:   Future    Standing Expiration Date:   04/17/2023    Order Specific Question:   Preferred imaging location?    Answer:   [facility]    Order Specific Question:   Release to patient    Answer:   Immediate    Order Specific Question:   Is Oral Contrast requested for this exam?    Answer:   Yes, Per Radiology protocol   All questions were answered. The patient knows to call the clinic with any problems, questions or concerns. No barriers to learning was detected. The total time spent in the appointment was 25 minutes.     Truitt Merle, MD 04/16/2022   Felicity Coyer, CMA, am acting as scribe for Truitt Merle, MD.   I have reviewed the above documentation for accuracy and completeness, and I agree with the above.

## 2022-04-18 ENCOUNTER — Other Ambulatory Visit: Payer: Self-pay

## 2022-05-03 DIAGNOSIS — H35033 Hypertensive retinopathy, bilateral: Secondary | ICD-10-CM | POA: Diagnosis not present

## 2022-05-03 DIAGNOSIS — H40023 Open angle with borderline findings, high risk, bilateral: Secondary | ICD-10-CM | POA: Diagnosis not present

## 2022-05-03 DIAGNOSIS — E119 Type 2 diabetes mellitus without complications: Secondary | ICD-10-CM | POA: Diagnosis not present

## 2022-05-03 DIAGNOSIS — H04123 Dry eye syndrome of bilateral lacrimal glands: Secondary | ICD-10-CM | POA: Diagnosis not present

## 2022-05-04 DIAGNOSIS — C182 Malignant neoplasm of ascending colon: Secondary | ICD-10-CM | POA: Diagnosis not present

## 2022-05-10 ENCOUNTER — Ambulatory Visit
Admission: RE | Admit: 2022-05-10 | Discharge: 2022-05-10 | Disposition: A | Discharge status: Home / Self Care | Payer: Medicare Other | Source: Ambulatory Visit | Attending: Nurse Practitioner | Admitting: Nurse Practitioner

## 2022-05-10 NOTE — Progress Notes (Addendum)
  Radiation Oncology         [phone] ________________________________  Name: Joseph Pennington MRN: [id number]  Date of Service: 05/10/2022  DOB: January 19, 1932  Post Treatment Telephone Note  Diagnosis:  pT3N1cM0, poorly differentiated adenocarcinoma of the ascending colon with local involvement at the time of surgery along the right kidney and duodenum   Intent: Curative  Radiation Treatment Dates: 02/15/2022 through 03/26/2022 Site Technique Total Dose (Gy) Dose per Fx (Gy) Completed Fx Beam Energies  Abdomen: Abd 3D 45/45 1.8 25/25 6X, 10X, 15X  Abdomen: Abd_Bst 3D 5.4/5.4 1.8 3/3 6X, 10X, 15X   (as documented in provider EOT note)   The patient was not available for call today. Detailed voicemail left.   The patient has scheduled follow up with his medical oncologist Dr. Burr Medico for ongoing surveillance, and was encouraged to call if he develops concerns or questions regarding radiation.   Leandra Kern, LPN

## 2022-05-24 ENCOUNTER — Inpatient Hospital Stay: Payer: Medicare Other | Attending: Nurse Practitioner

## 2022-05-24 ENCOUNTER — Encounter (HOSPITAL_COMMUNITY): Payer: Self-pay

## 2022-05-24 ENCOUNTER — Ambulatory Visit (HOSPITAL_COMMUNITY)
Admission: RE | Admit: 2022-05-24 | Discharge: 2022-05-24 | Disposition: A | Discharge status: Home / Self Care | Payer: Medicare Other | Source: Ambulatory Visit | Attending: Hematology | Admitting: Hematology

## 2022-05-24 ENCOUNTER — Other Ambulatory Visit: Payer: Self-pay

## 2022-05-24 DIAGNOSIS — C182 Malignant neoplasm of ascending colon: Secondary | ICD-10-CM | POA: Diagnosis not present

## 2022-05-24 DIAGNOSIS — K6389 Other specified diseases of intestine: Secondary | ICD-10-CM | POA: Diagnosis not present

## 2022-05-24 DIAGNOSIS — Z9221 Personal history of antineoplastic chemotherapy: Secondary | ICD-10-CM | POA: Insufficient documentation

## 2022-05-24 DIAGNOSIS — E860 Dehydration: Secondary | ICD-10-CM

## 2022-05-24 DIAGNOSIS — Z923 Personal history of irradiation: Secondary | ICD-10-CM | POA: Insufficient documentation

## 2022-05-24 DIAGNOSIS — D6481 Anemia due to antineoplastic chemotherapy: Secondary | ICD-10-CM | POA: Insufficient documentation

## 2022-05-24 DIAGNOSIS — D500 Iron deficiency anemia secondary to blood loss (chronic): Secondary | ICD-10-CM

## 2022-05-24 DIAGNOSIS — R600 Localized edema: Secondary | ICD-10-CM | POA: Insufficient documentation

## 2022-05-24 DIAGNOSIS — Z85038 Personal history of other malignant neoplasm of large intestine: Secondary | ICD-10-CM | POA: Insufficient documentation

## 2022-05-24 DIAGNOSIS — J90 Pleural effusion, not elsewhere classified: Secondary | ICD-10-CM | POA: Diagnosis not present

## 2022-05-24 LAB — CBC WITH DIFFERENTIAL (CANCER CENTER ONLY)
Abs Immature Granulocytes: 0.02 10*3/uL (ref 0.00–0.07)
Basophils Absolute: 0 10*3/uL (ref 0.0–0.1)
Basophils Relative: 0 %
Eosinophils Absolute: 0 10*3/uL (ref 0.0–0.5)
Eosinophils Relative: 1 %
HCT: 26.7 % — ABNORMAL LOW (ref 39.0–52.0)
Hemoglobin: 9.2 g/dL — ABNORMAL LOW (ref 13.0–17.0)
Immature Granulocytes: 1 %
Lymphocytes Relative: 9 %
Lymphs Abs: 0.3 10*3/uL — ABNORMAL LOW (ref 0.7–4.0)
MCH: 35.4 pg — ABNORMAL HIGH (ref 26.0–34.0)
MCHC: 34.5 g/dL (ref 30.0–36.0)
MCV: 102.7 fL — ABNORMAL HIGH (ref 80.0–100.0)
Monocytes Absolute: 0.3 10*3/uL (ref 0.1–1.0)
Monocytes Relative: 8 %
Neutro Abs: 3.3 10*3/uL (ref 1.7–7.7)
Neutrophils Relative %: 81 %
Platelet Count: 150 10*3/uL (ref 150–400)
RBC: 2.6 MIL/uL — ABNORMAL LOW (ref 4.22–5.81)
RDW: 12.5 % (ref 11.5–15.5)
WBC Count: 4 10*3/uL (ref 4.0–10.5)
nRBC: 0 % (ref 0.0–0.2)

## 2022-05-24 LAB — CMP (CANCER CENTER ONLY)
ALT: 8 U/L (ref 0–44)
AST: 15 U/L (ref 15–41)
Albumin: 3.4 g/dL — ABNORMAL LOW (ref 3.5–5.0)
Alkaline Phosphatase: 92 U/L (ref 38–126)
Anion gap: 5 (ref 5–15)
BUN: 17 mg/dL (ref 8–23)
CO2: 26 mmol/L (ref 22–32)
Calcium: 8.9 mg/dL (ref 8.9–10.3)
Chloride: 109 mmol/L (ref 98–111)
Creatinine: 1.06 mg/dL (ref 0.61–1.24)
GFR, Estimated: >60 mL/min (ref >60)
Glucose, Bld: 149 mg/dL — ABNORMAL HIGH (ref 70–99)
Potassium: 3.9 mmol/L (ref 3.5–5.1)
Sodium: 140 mmol/L (ref 135–145)
Total Bilirubin: 0.6 mg/dL (ref 0.3–1.2)
Total Protein: 6.1 g/dL — ABNORMAL LOW (ref 6.5–8.1)

## 2022-05-24 MED ORDER — HEPARIN SOD (PORK) LOCK FLUSH 100 UNIT/ML IV SOLN
500.0000 [IU] | Freq: Once | INTRAVENOUS | Status: AC
  Administered 2022-05-24: 500 [IU] via INTRAVENOUS

## 2022-05-24 MED ORDER — SODIUM CHLORIDE 0.9% FLUSH
10.0000 mL | Freq: Once | INTRAVENOUS | Status: AC
  Administered 2022-05-24: 10 mL

## 2022-05-24 MED ORDER — SODIUM CHLORIDE (PF) 0.9 % IJ SOLN
INTRAMUSCULAR | Status: AC
  Filled 2022-05-24: qty 50

## 2022-05-24 MED ORDER — IOHEXOL 300 MG/ML  SOLN
100.0000 mL | Freq: Once | INTRAMUSCULAR | Status: AC | PRN
  Administered 2022-05-24: 100 mL via INTRAVENOUS

## 2022-05-24 MED ORDER — IOHEXOL 9 MG/ML PO SOLN
ORAL | Status: AC
  Filled 2022-05-24: qty 1000

## 2022-05-27 ENCOUNTER — Other Ambulatory Visit: Payer: Self-pay

## 2022-05-27 ENCOUNTER — Inpatient Hospital Stay (HOSPITAL_BASED_OUTPATIENT_CLINIC_OR_DEPARTMENT_OTHER): Payer: Medicare Other | Admitting: Hematology

## 2022-05-27 ENCOUNTER — Encounter: Payer: Self-pay | Admitting: Hematology

## 2022-05-27 VITALS — BP 146/79 | HR 77 | Temp 98.1°F | Resp 15 | Ht 73.0 in | Wt 211.0 lb

## 2022-05-27 DIAGNOSIS — Z85038 Personal history of other malignant neoplasm of large intestine: Secondary | ICD-10-CM | POA: Diagnosis not present

## 2022-05-27 DIAGNOSIS — Z923 Personal history of irradiation: Secondary | ICD-10-CM | POA: Diagnosis not present

## 2022-05-27 DIAGNOSIS — C182 Malignant neoplasm of ascending colon: Secondary | ICD-10-CM | POA: Diagnosis not present

## 2022-05-27 DIAGNOSIS — D6481 Anemia due to antineoplastic chemotherapy: Secondary | ICD-10-CM | POA: Diagnosis not present

## 2022-05-27 DIAGNOSIS — Z9221 Personal history of antineoplastic chemotherapy: Secondary | ICD-10-CM | POA: Diagnosis not present

## 2022-05-27 DIAGNOSIS — R600 Localized edema: Secondary | ICD-10-CM | POA: Diagnosis not present

## 2022-05-27 DIAGNOSIS — J90 Pleural effusion, not elsewhere classified: Secondary | ICD-10-CM | POA: Diagnosis not present

## 2022-05-27 NOTE — Assessment & Plan Note (Signed)
pT3pN1cM0 stage IIIB, loss of MLH1 and PMS2, BRAF V600E+, MLH1 hypermethylation  -diagnosed 07/2021  -s/p open right colectomy by Dr. Dema Severin 09/14/21 -he completed 6 cycles adjuvant FOLFOX 10/22/21 - 01/13/22.  -he completed chemoRT with Xeloda on 03/26/2022 -continue, physical exam and lab test (including CBC, CMP and CEA) every 3 months for the first 2 years, then every 6-12 months, colonoscopy in one year, and surveilliance CT scan every 6-12 month for up to 5 year.  -surveillance CT from 05/24/22 showed NED

## 2022-05-27 NOTE — Progress Notes (Signed)
[place]   Telephone:([phone]) [phone] Fax:(336) [phone]   Clinic Follow up Note   Patient Care Team: Shirline Frees, MD as PCP - General (Family Medicine) Truitt Merle, MD as Consulting Physician (Hematology and Oncology)  Date of Service:  05/27/2022  CHIEF COMPLAINT: f/u of  colon cancer    CURRENT THERAPY:  Surveillance   ASSESSMENT:  Joseph Pennington is a 89 y.o. 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 Dema Severin 09/14/21 -he completed 6 cycles adjuvant FOLFOX 10/22/21 - 01/13/22.  -he completed chemoRT with Xeloda on 03/26/2022 -continue, physical exam and lab test (including CBC, CMP and CEA) every 3 months for the first 2 years, then every 6-12 months, colonoscopy in one year, and surveilliance CT scan every 6-12 month for up to 5 year.  -surveillance CT from 05/24/22 showed NED, new small bilateral pleural effusion, right more than left.  He also has mild lower extremity edema.  Not sure about the etiology of his pleural effusion, will watch it for now, he is asymptomatic. -Continue colon cancer surveillance.  He is due for colonoscopy in June 2024. -Lab and follow-up in 3 months, will repeat a CT scan in 6 months.  Anemia -Secondary to previous chemotherapy -Improving, no need of blood transfusion  PLAN: -Lab and CT scan from reviewed, NED -Lab and port flush in 6 and 12 weeks -Follow-up in 12 weeks   SUMMARY OF ONCOLOGIC HISTORY: Oncology History Overview Note   Cancer Staging  Primary adenocarcinoma of ascending colon ([place]) Staging form: Colon and Rectum, AJCC 8th Edition - Pathologic stage from 09/14/2021: Stage IIIB (pT3, pN1c, cM0) - Signed by Alla Feeling, NP on 10/08/2021 Stage prefix: Initial diagnosis Total positive nodes: 0 Histologic grading system: 4 grade system Histologic grade (G):  G3 Laterality: Right Lymph-vascular invasion (LVI): LVI not present (absent)/not identified Tumor deposits (TD): Present Perineural invasion (PNI): Absent Microsatellite instability (MSI): Unstable high BRAF mutation: Positive     Ascending colon cancer s/p lap colectomy 09/14/2021  07/23/2021 Procedure   Upper GI Endoscopy/Colonoscopy; Dr. Lorenso Courier  Colonoscopy Impression: - Likely malignant partially obstructing tumor in the ascending colon. Biopsied. - Six 3 to 8 mm polyps in the rectum and in the transverse colon, removed with a cold snare. Resected and retrieved. - Non-bleeding internal hemorrhoids.  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 Biopsied. - Multiple gastric polyps. Resected and retrieved. - Erythematous mucosa in the antrum. Biopsied. - Mucosal nodule found in the duodenum. Biopsied. - Dilated lacteals were found in the duodenum. Biopsied. - Two non-bleeding angioectasias in the duodenum.   07/23/2021 Initial Biopsy   Diagnosis 1. Duodenum, Biopsy - BENIGN DUODENAL MUCOSA - NO ACUTE INFLAMMATION, VILLOUS BLUNTING OR INCREASED INTRAEPITHELIAL LYMPHOCYTES IDENTIFIED 2. Duodenum, Biopsy, Nodule - PEPTIC DUODENITIS - NO DYSPLASIA OR MALIGNANCY IDENTIFIED 3. Stomach, biopsy - MILD CHRONIC GASTRITIS WITHOUT ACTIVITY - NO INTESTINAL METAPLASIA IDENTIFIED - SEE COMMENT 4. Stomach, polyp(s) - HYPERPLASTIC GASTRIC POLYP(S) WITH INTESTINAL METAPLASIA - NO H. PYLORI OR MALIGNANCY IDENTIFIED 5. Esophagogastric junction, biopsy - SQUAMOCOLUMNAR JUNCTION WITH CHRONIC INFLAMMATION - NO INTESTINAL METAPLASIA, DYSPLASIA OR MALIGNANCY IDENTIFIED 6. Rectum, biopsy, and transverse - MULTIPLE FRAGMENTS OF HYPERPLASTIC POLYP(S) - NO HIGH-GRADE DYSPLASIA OR MALIGNANCY IDENTIFIED 7. Ascending Colon Biopsy, Mass - POORLY DIFFERENTIATED ADENOCARCINOMA - SEE COMMENT  Microscopic Comment 7. By immunohistochemistry, the  neoplastic cells are positive for CDX2 (patchy, weak) but negative for p63, p40, CD56, chromogranin and synaptophysin. This immunoprofile is consistent with a poorly differentiated adenocarcinoma.  3. H. pylori immunohistochemistry is POSITIVE for RARE microorganisms.   07/30/2021 Imaging   EXAM: CT CHEST, ABDOMEN, AND PELVIS WITH CONTRAST  IMPRESSION: 1. Masslike region in the ascending colon extending to the level of the hepatic flexure in the right lower quadrant, concerning for malignancy. Associated pericolonic fat stranding and a few prominent lymph nodes are seen adjacent to this segment of colon, suspicious for local infiltration. No distant metastasis is seen. 2. Left inguinal hernia containing nonobstructed colon. 3. Nonspecific small ground-glass opacity in the in the right lower lobe. Attention on follow-up is recommended. 4. Small hiatal hernia. 5. Aortic atherosclerosis and coronary artery calcifications   08/06/2021 Initial Diagnosis   Malignant neoplasm of ascending colon [facility])   Primary adenocarcinoma of ascending colon ([place])  07/23/2021 Procedure   Colonoscopy by Dr. Lorenso Courier - A frond-like/villous, fungating and ulcerated partially obstructing large mass was found in the ascending colon. There was a point at which the mass could not be bypassed, even with use of an ultraslim colonoscope. It is suspected that this mass encompasses the entire cecum and ascending colon. The mass was circumferential. The mass measured fifteen cm in length. Oozing was present.    07/23/2021 Initial Biopsy   3. H. pylori immunohistochemistry is POSITIVE for RARE microorganisms.  FINAL DIAGNOSIS Diagnosis 1. Duodenum, Biopsy - BENIGN DUODENAL MUCOSA - NO ACUTE INFLAMMATION, VILLOUS BLUNTING OR INCREASED INTRAEPITHELIAL LYMPHOCYTES IDENTIFIED 2. Duodenum, Biopsy, Nodule - PEPTIC DUODENITIS - NO DYSPLASIA OR MALIGNANCY IDENTIFIED 3. Stomach, biopsy - MILD CHRONIC GASTRITIS WITHOUT  ACTIVITY - NO INTESTINAL METAPLASIA IDENTIFIED - SEE COMMENT 4. Stomach, polyp(s) - HYPERPLASTIC GASTRIC POLYP(S) WITH INTESTINAL METAPLASIA - NO H. PYLORI OR MALIGNANCY IDENTIFIED 5. Esophagogastric junction, biopsy - SQUAMOCOLUMNAR JUNCTION WITH CHRONIC INFLAMMATION - NO INTESTINAL METAPLASIA, DYSPLASIA OR MALIGNANCY IDENTIFIED 6. Rectum, biopsy, and transverse - MULTIPLE FRAGMENTS OF HYPERPLASTIC POLYP(S) - NO HIGH-GRADE DYSPLASIA OR MALIGNANCY IDENTIFIED 7. Ascending Colon Biopsy, Mass - POORLY DIFFERENTIATED ADENOCARCINOMA - SEE COMMENT   07/30/2021 Imaging   CT CAP IMPRESSION: 1. Masslike region in the ascending colon extending to the level of the hepatic flexure in the right lower quadrant, concerning for malignancy. Associated pericolonic fat stranding and a few prominent lymph nodes are seen adjacent to this segment of colon, suspicious for local infiltration. No distant metastasis is seen. 2. Left inguinal hernia containing nonobstructed colon. 3. Nonspecific small ground-glass opacity in the in the right lower lobe. Attention on follow-up is recommended. 4. Small hiatal hernia. 5. Aortic atherosclerosis and coronary artery calcifications   09/14/2021 Surgery   PROCEDURE:  Laparoscopic converted to open right hemicolectomy Repair of small bowel serosa x 2     09/14/2021 Pathology Results   A. COLON, RIGHT, HEMI COLECTOMY:  Poorly differentiated adenocarcinoma in the ascending colon with clear margins of resection.  Tumor Size: Circumferential measuring 9.0 cm X 8.0 cm. X 2.5 cm.  Macroscopic Tumor Perforation: Not identified  Histologic Type: Adenocarcinoma.  Histologic Grade: Grade 3, poorly differentiated.  Multiple Primary Sites: Not applicable  Tumor Extension: Extends through the muscular wall into the pericolonic adipose tissue.  Lymphovascular Invasion: Not identified.  Perineural Invasion: Not identified.  Treatment Effect: No known presurgical therapy.   Margins:       Margin Status for Invasive Carcinoma: All margins are negative for invasive carcinoma       Distance  from Invasive Carcinoma to proximal margin: 11 cm.       Distance from invasive carcinoma to distal margin: 12 cm.  Regional Lymph Nodes:       Number of Lymph Nodes with Tumor: None.       Number of Lymph Nodes Examined: Twenty-two (22)  Tumor Deposits: Present (slide A11)  Distant Metastasis:       Distant Site(s) Involved: None known.  Pathologic Stage Classification (pTNM, AJCC 8th Edition): pT3 pN1c  Comments: Omentum is free of neoplastic invasion.                      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[facility])    09/14/2021 Cancer Staging   Staging form: Colon and Rectum, AJCC 8th Edition - Pathologic stage from 09/14/2021: Stage IIIB (pT3, pN1c, cM0) - Signed by Alla Feeling, NP on 10/08/2021 Stage prefix: Initial diagnosis Total positive nodes: 0 Histologic grading system: 4 grade system Histologic grade (G): G3 Laterality: Right Lymph-vascular invasion (LVI): LVI not present (absent)/not identified Tumor deposits (TD): Present Perineural invasion (PNI): Absent Microsatellite instability (MSI): Unstable high BRAF mutation: Positive   10/08/2021 Initial Diagnosis   Primary adenocarcinoma of ascending colon ([place])   10/22/2021 - 11/07/2021 Chemotherapy   Patient is on Treatment Plan : COLORECTAL FOLFOX q14d x 3 months     10/22/2021 -  Chemotherapy   Patient is on Treatment Plan : COLORECTAL FOLFOX q14d x 3 months        INTERVAL HISTORY:  COLSTON EINCK is here for a follow up of  colon cancer   He was last seen by me on 04/16/2022 He presents to the clinic with his daughter-in-law.  He is slowly recovering from chemoradiation, energy has improved, is about 50% of his normal energy level.  He is able to walk more, and climb stairs without stopping.  No chest pain, shortness of breath, or other new complaints.  He still has numbness in his fingers and  toes, and around his ankle.  He is getting B12 injection monthly.  Appetite is normal, weight is stable.  No abdominal pain or other concerns.   All other systems were reviewed with the patient and are negative.  MEDICAL HISTORY:  Past Medical History:  Diagnosis Date   Acute cholecystitis 02/19/2018   Anemia    Chronic kidney disease    Colon cancer ([place])    Diabetes mellitus without complication ([facility])    diet controlled   GERD (gastroesophageal reflux disease)    History of blood transfusion 08/2021   History of hiatal hernia    Hypertension    Hypothyroidism    Iron deficiency anemia    Peripheral neuropathy    bilateral feet    SURGICAL HISTORY: Past Surgical History:  Procedure Laterality Date   CHOLECYSTECTOMY N/A 02/20/2018   Procedure: LAPAROSCOPIC CHOLECYSTECTOMY;  Surgeon: Excell Seltzer, MD;  Location: [facility];  Service: General;  Laterality: N/A;   COLONOSCOPY  2023   LAPAROSCOPIC RIGHT HEMI COLECTOMY Right 09/14/2021   Procedure: LAPAROSCOPIC TO OPEN RIGHT HEMI COLECTOMY;  Surgeon: Ileana Roup, MD;  Location: [facility];  Service: General;  Laterality: Right;   PORTACATH PLACEMENT N/A 10/15/2021   Procedure: INSERTION PORT-A-CATH;  Surgeon: Ileana Roup, MD;  Location: [facility];  Service: General;  Laterality: N/A;  with ultrasound and flouroscopic guidance    I have reviewed the social history and family history with the patient and they are unchanged from previous note.  ALLERGIES:  has No Known Allergies.  MEDICATIONS:  Current Outpatient Medications  Medication Sig Dispense Refill   acetaminophen (TYLENOL) 500 MG tablet Take 500-1,000 mg by mouth every 6 (six) hours as needed for moderate pain.     ARTIFICIAL TEAR SOLUTION OP Place 1 drop into both eyes daily as needed (dry eyes).     cyanocobalamin (,VITAMIN B-12,) 1000 MCG/ML injection Inject 1,000 mcg into the muscle every 30 (thirty) days.   0   diphenoxylate-atropine (LOMOTIL) 2.5-0.025  MG tablet Take 1-2 tablets by mouth 4 (four) times daily as needed for diarrhea or loose stools. 30 tablet 0   Iron, Ferrous Sulfate, 325 (65 Fe) MG TABS Take 325 mg by mouth daily.     levothyroxine (SYNTHROID) 75 MCG tablet Take 75 mcg by mouth daily before breakfast.     lisinopril (PRINIVIL,ZESTRIL) 20 MG tablet Take 20 mg by mouth daily.  3   ondansetron (ZOFRAN) 8 MG tablet Take 1 tablet (8 mg total) by mouth every 8 (eight) hours as needed for nausea or vomiting. 90 tablet 0   polyethylene glycol powder (GLYCOLAX/MIRALAX) 17 GM/SCOOP powder Take 17 g by mouth as needed for mild constipation. (Patient not taking: Reported on 01/07/2022)     potassium chloride SA (KLOR-CON M) 20 MEQ tablet Take 1 tablet (20 mEq total) by mouth 2 (two) times daily. One po bid x 3 days, then one po once a day 60 tablet 0   vitamin C (ASCORBIC ACID) 500 MG tablet Take 500 mg by mouth daily.     No current facility-administered medications for this visit.    PHYSICAL EXAMINATION: ECOG PERFORMANCE STATUS: 1 - Symptomatic but completely ambulatory  Vitals:   05/27/22 1203  BP: (!) 146/79  Pulse: 77  Resp: 15  Temp: 98.1 F (36.7 C)  SpO2: 100%   Wt Readings from Last 3 Encounters:  05/27/22 211 lb (95.7 kg)  04/16/22 192 lb 12.8 oz (87.5 kg)  03/24/22 196 lb 11.2 oz (89.2 kg)     GENERAL:alert, no distress and comfortable SKIN: skin color, texture, turgor are normal, no rashes or significant lesions EYES: normal, Conjunctiva are pink and non-injected, sclera clear NECK: supple, thyroid normal size, non-tender, without nodularity LYMPH:  no palpable lymphadenopathy in the cervical, axillary  LUNGS: clear to auscultation and percussion with normal breathing effort HEART: regular rate & rhythm and no murmurs and no lower extremity edema ABDOMEN:abdomen soft, non-tender and normal bowel sounds Musculoskeletal:no cyanosis of digits and no clubbing  NEURO: alert & oriented x 3 with fluent speech, no  focal motor/sensory deficits  LABORATORY DATA:  I have reviewed the data as listed    Latest Ref Rng & Units 05/24/2022   12:18 PM 04/16/2022    2:02 PM 03/24/2022    1:56 PM  CBC  WBC 4.0 - 10.5 K/uL 4.0  4.9  4.4   Hemoglobin 13.0 - 17.0 g/dL 9.2  7.9  8.3   Hematocrit 39.0 - 52.0 % 26.7  22.8  22.7   Platelets 150 - 400 K/uL 150  172  157         Latest Ref Rng & Units 05/24/2022   12:18 PM 04/16/2022    2:02 PM 03/24/2022    1:56 PM  CMP  Glucose 70 - 99 mg/dL 149  130  127   BUN 8 - 23 mg/dL '17  19  17   '$ Creatinine 0.61 - 1.24 mg/dL 1.06  1.02  0.98   Sodium 135 -  145 mmol/L 140  136  134   Potassium 3.5 - 5.1 mmol/L 3.9  3.8  3.6   Chloride 98 - 111 mmol/L 109  105  103   CO2 22 - 32 mmol/L '26  27  23   '$ Calcium 8.9 - 10.3 mg/dL 8.9  8.6  8.2   Total Protein 6.5 - 8.1 g/dL 6.1  5.5  5.9   Total Bilirubin 0.3 - 1.2 mg/dL 0.6  0.4  0.8   Alkaline Phos 38 - 126 U/L 92  84  66   AST 15 - 41 U/L '15  15  21   '$ ALT 0 - 44 U/L '8  9  10       '$ RADIOGRAPHIC STUDIES: I have personally reviewed the radiological images as listed and agreed with the findings in the report. No results found.    No orders of the defined types were placed in this encounter.  All questions were answered. The patient knows to call the clinic with any problems, questions or concerns. No barriers to learning was detected. The total time spent in the appointment was 25 minutes.     Truitt Merle, MD 05/27/2022   Felicity Coyer, CMA, am acting as scribe for Truitt Merle, MD.   I have reviewed the above documentation for accuracy and completeness, and I agree with the above.

## 2022-06-22 ENCOUNTER — Other Ambulatory Visit: Payer: Self-pay

## 2022-06-24 ENCOUNTER — Other Ambulatory Visit: Payer: Self-pay

## 2022-07-08 ENCOUNTER — Inpatient Hospital Stay: Payer: Medicare Other | Attending: Nurse Practitioner

## 2022-07-08 ENCOUNTER — Other Ambulatory Visit: Payer: Self-pay

## 2022-07-08 DIAGNOSIS — Z85038 Personal history of other malignant neoplasm of large intestine: Secondary | ICD-10-CM | POA: Diagnosis not present

## 2022-07-08 DIAGNOSIS — T451X5S Adverse effect of antineoplastic and immunosuppressive drugs, sequela: Secondary | ICD-10-CM | POA: Insufficient documentation

## 2022-07-08 DIAGNOSIS — E860 Dehydration: Secondary | ICD-10-CM

## 2022-07-08 DIAGNOSIS — D6481 Anemia due to antineoplastic chemotherapy: Secondary | ICD-10-CM | POA: Insufficient documentation

## 2022-07-08 DIAGNOSIS — D500 Iron deficiency anemia secondary to blood loss (chronic): Secondary | ICD-10-CM

## 2022-07-08 DIAGNOSIS — C182 Malignant neoplasm of ascending colon: Secondary | ICD-10-CM

## 2022-07-08 DIAGNOSIS — Z452 Encounter for adjustment and management of vascular access device: Secondary | ICD-10-CM | POA: Insufficient documentation

## 2022-07-08 LAB — CMP (CANCER CENTER ONLY)
ALT: 16 U/L (ref 0–44)
AST: 20 U/L (ref 15–41)
Albumin: 3.7 g/dL (ref 3.5–5.0)
Alkaline Phosphatase: 103 U/L (ref 38–126)
Anion gap: 5 (ref 5–15)
BUN: 24 mg/dL — ABNORMAL HIGH (ref 8–23)
CO2: 27 mmol/L (ref 22–32)
Calcium: 9.5 mg/dL (ref 8.9–10.3)
Chloride: 107 mmol/L (ref 98–111)
Creatinine: 1.06 mg/dL (ref 0.61–1.24)
GFR, Estimated: >60 mL/min (ref >60)
Glucose, Bld: 122 mg/dL — ABNORMAL HIGH (ref 70–99)
Potassium: 3.9 mmol/L (ref 3.5–5.1)
Sodium: 139 mmol/L (ref 135–145)
Total Bilirubin: 1 mg/dL (ref 0.3–1.2)
Total Protein: 6.6 g/dL (ref 6.5–8.1)

## 2022-07-08 LAB — FERRITIN: Ferritin: 96 ng/mL (ref 24–336)

## 2022-07-08 LAB — CBC WITH DIFFERENTIAL (CANCER CENTER ONLY)
Abs Immature Granulocytes: 0.01 10*3/uL (ref 0.00–0.07)
Basophils Absolute: 0 10*3/uL (ref 0.0–0.1)
Basophils Relative: 0 %
Eosinophils Absolute: 0.1 10*3/uL (ref 0.0–0.5)
Eosinophils Relative: 1 %
HCT: 28.5 % — ABNORMAL LOW (ref 39.0–52.0)
Hemoglobin: 9.9 g/dL — ABNORMAL LOW (ref 13.0–17.0)
Immature Granulocytes: 0 %
Lymphocytes Relative: 11 %
Lymphs Abs: 0.4 10*3/uL — ABNORMAL LOW (ref 0.7–4.0)
MCH: 33.7 pg (ref 26.0–34.0)
MCHC: 34.7 g/dL (ref 30.0–36.0)
MCV: 96.9 fL (ref 80.0–100.0)
Monocytes Absolute: 0.4 10*3/uL (ref 0.1–1.0)
Monocytes Relative: 10 %
Neutro Abs: 3.2 10*3/uL (ref 1.7–7.7)
Neutrophils Relative %: 78 %
Platelet Count: 158 10*3/uL (ref 150–400)
RBC: 2.94 MIL/uL — ABNORMAL LOW (ref 4.22–5.81)
RDW: 12.5 % (ref 11.5–15.5)
WBC Count: 4.2 10*3/uL (ref 4.0–10.5)
nRBC: 0 % (ref 0.0–0.2)

## 2022-07-08 LAB — IRON AND IRON BINDING CAPACITY (CC-WL,HP ONLY)
Iron: 91 ug/dL (ref 45–182)
Saturation Ratios: 33 % (ref 17.9–39.5)
TIBC: 277 ug/dL (ref 250–450)
UIBC: 186 ug/dL (ref 117–376)

## 2022-07-08 LAB — CEA (IN HOUSE-CHCC): CEA (CHCC-In House): <1 ng/mL (ref 0.00–5.00)

## 2022-07-08 MED ORDER — SODIUM CHLORIDE 0.9% FLUSH
10.0000 mL | Freq: Once | INTRAVENOUS | Status: AC
  Administered 2022-07-08: 10 mL

## 2022-07-08 MED ORDER — HEPARIN SOD (PORK) LOCK FLUSH 100 UNIT/ML IV SOLN
500.0000 [IU] | Freq: Once | INTRAVENOUS | Status: AC
  Administered 2022-07-08: 500 [IU]

## 2022-07-23 ENCOUNTER — Other Ambulatory Visit: Payer: Self-pay

## 2022-08-25 ENCOUNTER — Other Ambulatory Visit: Payer: Self-pay

## 2022-08-25 DIAGNOSIS — C182 Malignant neoplasm of ascending colon: Secondary | ICD-10-CM

## 2022-08-25 DIAGNOSIS — D500 Iron deficiency anemia secondary to blood loss (chronic): Secondary | ICD-10-CM

## 2022-08-26 ENCOUNTER — Inpatient Hospital Stay: Payer: Medicare Other | Attending: Nurse Practitioner | Admitting: Hematology

## 2022-08-26 ENCOUNTER — Inpatient Hospital Stay: Payer: Medicare Other

## 2022-08-26 ENCOUNTER — Encounter: Payer: Self-pay | Admitting: Hematology

## 2022-08-26 ENCOUNTER — Other Ambulatory Visit: Payer: Self-pay

## 2022-08-26 VITALS — BP 126/53 | HR 72 | Temp 98.1°F | Resp 15 | Ht 73.0 in | Wt 205.8 lb

## 2022-08-26 DIAGNOSIS — D500 Iron deficiency anemia secondary to blood loss (chronic): Secondary | ICD-10-CM | POA: Diagnosis not present

## 2022-08-26 DIAGNOSIS — Z85038 Personal history of other malignant neoplasm of large intestine: Secondary | ICD-10-CM | POA: Insufficient documentation

## 2022-08-26 DIAGNOSIS — E860 Dehydration: Secondary | ICD-10-CM

## 2022-08-26 DIAGNOSIS — Z9221 Personal history of antineoplastic chemotherapy: Secondary | ICD-10-CM | POA: Diagnosis not present

## 2022-08-26 DIAGNOSIS — C182 Malignant neoplasm of ascending colon: Secondary | ICD-10-CM

## 2022-08-26 DIAGNOSIS — Z9049 Acquired absence of other specified parts of digestive tract: Secondary | ICD-10-CM | POA: Insufficient documentation

## 2022-08-26 DIAGNOSIS — G620 Drug-induced polyneuropathy: Secondary | ICD-10-CM | POA: Diagnosis not present

## 2022-08-26 LAB — CBC WITH DIFFERENTIAL (CANCER CENTER ONLY)
Abs Immature Granulocytes: 0.01 10*3/uL (ref 0.00–0.07)
Basophils Absolute: 0 10*3/uL (ref 0.0–0.1)
Basophils Relative: 1 %
Eosinophils Absolute: 0.1 10*3/uL (ref 0.0–0.5)
Eosinophils Relative: 1 %
HCT: 27.2 % — ABNORMAL LOW (ref 39.0–52.0)
Hemoglobin: 9.4 g/dL — ABNORMAL LOW (ref 13.0–17.0)
Immature Granulocytes: 0 %
Lymphocytes Relative: 11 %
Lymphs Abs: 0.4 10*3/uL — ABNORMAL LOW (ref 0.7–4.0)
MCH: 33.3 pg (ref 26.0–34.0)
MCHC: 34.6 g/dL (ref 30.0–36.0)
MCV: 96.5 fL (ref 80.0–100.0)
Monocytes Absolute: 0.3 10*3/uL (ref 0.1–1.0)
Monocytes Relative: 9 %
Neutro Abs: 2.8 10*3/uL (ref 1.7–7.7)
Neutrophils Relative %: 78 %
Platelet Count: 149 10*3/uL — ABNORMAL LOW (ref 150–400)
RBC: 2.82 MIL/uL — ABNORMAL LOW (ref 4.22–5.81)
RDW: 13.2 % (ref 11.5–15.5)
WBC Count: 3.5 10*3/uL — ABNORMAL LOW (ref 4.0–10.5)
nRBC: 0 % (ref 0.0–0.2)

## 2022-08-26 LAB — FERRITIN: Ferritin: 84 ng/mL (ref 24–336)

## 2022-08-26 LAB — CMP (CANCER CENTER ONLY)
ALT: 13 U/L (ref 0–44)
AST: 17 U/L (ref 15–41)
Albumin: 3.6 g/dL (ref 3.5–5.0)
Alkaline Phosphatase: 90 U/L (ref 38–126)
Anion gap: 4 — ABNORMAL LOW (ref 5–15)
BUN: 23 mg/dL (ref 8–23)
CO2: 27 mmol/L (ref 22–32)
Calcium: 9 mg/dL (ref 8.9–10.3)
Chloride: 109 mmol/L (ref 98–111)
Creatinine: 1.17 mg/dL (ref 0.61–1.24)
GFR, Estimated: 60 mL/min — ABNORMAL LOW (ref >60)
Glucose, Bld: 160 mg/dL — ABNORMAL HIGH (ref 70–99)
Potassium: 4 mmol/L (ref 3.5–5.1)
Sodium: 140 mmol/L (ref 135–145)
Total Bilirubin: 0.9 mg/dL (ref 0.3–1.2)
Total Protein: 6.1 g/dL — ABNORMAL LOW (ref 6.5–8.1)

## 2022-08-26 LAB — CEA (ACCESS): CEA (CHCC): <1 ng/mL (ref 0.00–5.00)

## 2022-08-26 MED ORDER — SODIUM CHLORIDE 0.9% FLUSH
10.0000 mL | Freq: Once | INTRAVENOUS | Status: AC
  Administered 2022-08-26: 10 mL

## 2022-08-26 MED ORDER — HEPARIN SOD (PORK) LOCK FLUSH 100 UNIT/ML IV SOLN
500.0000 [IU] | Freq: Once | INTRAVENOUS | Status: AC
  Administered 2022-08-26: 500 [IU]

## 2022-08-26 NOTE — Progress Notes (Signed)
[facility]   [phone] Fax:([phone]) [phone]   Clinic Follow up Note   Patient Care Team: Johny Blamer, MD as PCP - General (Family Medicine) Malachy Mood, MD as Consulting Physician (Hematology and Oncology)  Date of Service:  08/26/2022  CHIEF COMPLAINT: f/u of colon cancer    CURRENT THERAPY:  Surveillance   ASSESSMENT:  Joseph Pennington is a 89 y.o. 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 Cliffton Asters 09/14/21 -he completed 6 cycles adjuvant FOLFOX 10/22/21 - 01/13/22.  -he completed chemoRT with Xeloda on 03/26/2022 -continue, physical exam and lab test (including CBC, CMP and CEA) every 3 months for the first 2 years, then every 6-12 months, colonoscopy in one year, and surveilliance CT scan every 6-12 month for up to 5 year.  -surveillance CT from 05/24/22 showed NED, new small bilateral pleural effusion, right more than left.  He also has mild lower extremity edema.  Not sure about the etiology of his pleural effusion, will watch it for now, he is asymptomatic. -Continue colon cancer surveillance.  He is due for colonoscopy in June 2024. -Lab and follow-up in 3 months, will repeat a CT scan in 3 months. -He is clinically doing well, and has recovered well from chemotherapy except mild neuropathy in his feet.  Lab reviewed, exam was unremarkable, no clinical concern for recurrence.    PLAN: -lab reviewed -hg 9.4 -I order CT CAP in 3 months -lab/flush in 6 weeks  -Lab, flush, CT CAP and f/u in 3 months -he is due for repeated colonoscopy, he has appointment with GI in a month   SUMMARY OF ONCOLOGIC HISTORY: Oncology History Overview Note   Cancer Staging  Primary adenocarcinoma of ascending colon (HCC) Staging form: Colon and Rectum, AJCC 8th Edition - Pathologic stage from 09/14/2021: Stage IIIB (pT3,  pN1c, cM0) - Signed by Pollyann Samples, NP on 10/08/2021 Stage prefix: Initial diagnosis Total positive nodes: 0 Histologic grading system: 4 grade system Histologic grade (G): G3 Laterality: Right Lymph-vascular invasion (LVI): LVI not present (absent)/not identified Tumor deposits (TD): Present Perineural invasion (PNI): Absent Microsatellite instability (MSI): Unstable high BRAF mutation: Positive     Ascending colon cancer s/p lap colectomy 09/14/2021  07/23/2021 Procedure   Upper GI Endoscopy/Colonoscopy; Dr. Leonides Schanz  Colonoscopy Impression: - Likely malignant partially obstructing tumor in the ascending colon. Biopsied. - Six 3 to 8 mm polyps in the rectum and in the transverse colon, removed with a cold snare. Resected and retrieved. - Non-bleeding internal hemorrhoids.  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 Biopsied. - Multiple gastric polyps. Resected and retrieved. - Erythematous mucosa in the antrum. Biopsied. - Mucosal nodule found in the duodenum. Biopsied. - Dilated lacteals were found in the duodenum. Biopsied. - Two non-bleeding angioectasias in the duodenum.   07/23/2021 Initial Biopsy   Diagnosis 1. Duodenum, Biopsy - BENIGN DUODENAL MUCOSA - NO ACUTE INFLAMMATION, VILLOUS BLUNTING OR INCREASED INTRAEPITHELIAL LYMPHOCYTES IDENTIFIED 2. Duodenum, Biopsy, Nodule - PEPTIC DUODENITIS - NO DYSPLASIA OR MALIGNANCY IDENTIFIED 3. Stomach, biopsy - MILD CHRONIC GASTRITIS WITHOUT ACTIVITY - NO INTESTINAL METAPLASIA IDENTIFIED - SEE COMMENT 4. Stomach, polyp(s) - HYPERPLASTIC GASTRIC POLYP(S) WITH INTESTINAL METAPLASIA - NO H. PYLORI OR MALIGNANCY IDENTIFIED 5. Esophagogastric junction, biopsy - SQUAMOCOLUMNAR JUNCTION WITH CHRONIC INFLAMMATION - NO INTESTINAL METAPLASIA, DYSPLASIA OR MALIGNANCY IDENTIFIED 6.  Rectum, biopsy, and transverse - MULTIPLE FRAGMENTS OF HYPERPLASTIC POLYP(S) - NO HIGH-GRADE  DYSPLASIA OR MALIGNANCY IDENTIFIED 7. Ascending Colon Biopsy, Mass - POORLY DIFFERENTIATED ADENOCARCINOMA - SEE COMMENT  Microscopic Comment 7. By immunohistochemistry, the neoplastic cells are positive for CDX2 (patchy, weak) but negative for p63, p40, CD56, chromogranin and synaptophysin. This immunoprofile is consistent with a poorly differentiated adenocarcinoma.  3. H. pylori immunohistochemistry is POSITIVE for RARE microorganisms.   07/30/2021 Imaging   EXAM: CT CHEST, ABDOMEN, AND PELVIS WITH CONTRAST  IMPRESSION: 1. Masslike region in the ascending colon extending to the level of the hepatic flexure in the right lower quadrant, concerning for malignancy. Associated pericolonic fat stranding and a few prominent lymph nodes are seen adjacent to this segment of colon, suspicious for local infiltration. No distant metastasis is seen. 2. Left inguinal hernia containing nonobstructed colon. 3. Nonspecific small ground-glass opacity in the in the right lower lobe. Attention on follow-up is recommended. 4. Small hiatal hernia. 5. Aortic atherosclerosis and coronary artery calcifications   08/06/2021 Initial Diagnosis   Malignant neoplasm of ascending colon [facility])   Primary adenocarcinoma of ascending colon (HCC)  07/23/2021 Procedure   Colonoscopy by Dr. Leonides Schanz - A frond-like/villous, fungating and ulcerated partially obstructing large mass was found in the ascending colon. There was a point at which the mass could not be bypassed, even with use of an ultraslim colonoscope. It is suspected that this mass encompasses the entire cecum and ascending colon. The mass was circumferential. The mass measured fifteen cm in length. Oozing was present.    07/23/2021 Initial Biopsy   3. H. pylori immunohistochemistry is POSITIVE for RARE microorganisms.  FINAL DIAGNOSIS Diagnosis 1. Duodenum, Biopsy - BENIGN DUODENAL MUCOSA - NO ACUTE INFLAMMATION, VILLOUS BLUNTING OR INCREASED  INTRAEPITHELIAL LYMPHOCYTES IDENTIFIED 2. Duodenum, Biopsy, Nodule - PEPTIC DUODENITIS - NO DYSPLASIA OR MALIGNANCY IDENTIFIED 3. Stomach, biopsy - MILD CHRONIC GASTRITIS WITHOUT ACTIVITY - NO INTESTINAL METAPLASIA IDENTIFIED - SEE COMMENT 4. Stomach, polyp(s) - HYPERPLASTIC GASTRIC POLYP(S) WITH INTESTINAL METAPLASIA - NO H. PYLORI OR MALIGNANCY IDENTIFIED 5. Esophagogastric junction, biopsy - SQUAMOCOLUMNAR JUNCTION WITH CHRONIC INFLAMMATION - NO INTESTINAL METAPLASIA, DYSPLASIA OR MALIGNANCY IDENTIFIED 6. Rectum, biopsy, and transverse - MULTIPLE FRAGMENTS OF HYPERPLASTIC POLYP(S) - NO HIGH-GRADE DYSPLASIA OR MALIGNANCY IDENTIFIED 7. Ascending Colon Biopsy, Mass - POORLY DIFFERENTIATED ADENOCARCINOMA - SEE COMMENT   07/30/2021 Imaging   CT CAP IMPRESSION: 1. Masslike region in the ascending colon extending to the level of the hepatic flexure in the right lower quadrant, concerning for malignancy. Associated pericolonic fat stranding and a few prominent lymph nodes are seen adjacent to this segment of colon, suspicious for local infiltration. No distant metastasis is seen. 2. Left inguinal hernia containing nonobstructed colon. 3. Nonspecific small ground-glass opacity in the in the right lower lobe. Attention on follow-up is recommended. 4. Small hiatal hernia. 5. Aortic atherosclerosis and coronary artery calcifications   09/14/2021 Surgery   PROCEDURE:  Laparoscopic converted to open right hemicolectomy Repair of small bowel serosa x 2     09/14/2021 Pathology Results   A. COLON, RIGHT, HEMI COLECTOMY:  Poorly differentiated adenocarcinoma in the ascending colon with clear margins of resection.  Tumor Size: Circumferential measuring 9.0 cm X 8.0 cm. X 2.5 cm.  Macroscopic Tumor Perforation: Not identified  Histologic Type: Adenocarcinoma.  Histologic Grade: Grade 3, poorly differentiated.  Multiple Primary Sites: Not applicable  Tumor Extension: Extends through the  muscular wall into the pericolonic adipose tissue.  Lymphovascular Invasion: Not identified.  Perineural  Invasion: Not identified.  Treatment Effect: No known presurgical therapy.  Margins:       Margin Status for Invasive Carcinoma: All margins are negative for invasive carcinoma       Distance from Invasive Carcinoma to proximal margin: 11 cm.       Distance from invasive carcinoma to distal margin: 12 cm.  Regional Lymph Nodes:       Number of Lymph Nodes with Tumor: None.       Number of Lymph Nodes Examined: Twenty-two (22)  Tumor Deposits: Present (slide A11)  Distant Metastasis:       Distant Site(s) Involved: None known.  Pathologic Stage Classification (pTNM, AJCC 8th Edition): pT3 pN1c  Comments: Omentum is free of neoplastic invasion.                      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LAMN)    09/14/2021 Cancer Staging   Staging form: Colon and Rectum, AJCC 8th Edition - Pathologic stage from 09/14/2021: Stage IIIB (pT3, pN1c, cM0) - Signed by Pollyann Samples, NP on 10/08/2021 Stage prefix: Initial diagnosis Total positive nodes: 0 Histologic grading system: 4 grade system Histologic grade (G): G3 Laterality: Right Lymph-vascular invasion (LVI): LVI not present (absent)/not identified Tumor deposits (TD): Present Perineural invasion (PNI): Absent Microsatellite instability (MSI): Unstable high BRAF mutation: Positive   10/08/2021 Initial Diagnosis   Primary adenocarcinoma of ascending colon (HCC)   10/22/2021 - 11/07/2021 Chemotherapy   Patient is on Treatment Plan : COLORECTAL FOLFOX q14d x 3 months     10/22/2021 -  Chemotherapy   Patient is on Treatment Plan : COLORECTAL FOLFOX q14d x 3 months        INTERVAL HISTORY:  Joseph Pennington is here for a follow up of colon cancer . He was last seen by me on 05/27/2022. He presents to the clinic alone. Pt state that his neuropathy has improve but it still there. Pt state that he has some memory loss. 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 Pt is clinically doing well.   All other systems were reviewed with the patient and are negative.  MEDICAL HISTORY:  Past Medical History:  Diagnosis Date   Acute cholecystitis 02/19/2018   Anemia    Chronic kidney disease    Colon cancer (HCC)    Diabetes mellitus without complication (HCC)    diet controlled   GERD (gastroesophageal reflux disease)    History of blood transfusion 08/2021   History of hiatal hernia    Hypertension    Hypothyroidism    Iron deficiency anemia    Peripheral neuropathy    bilateral feet    SURGICAL HISTORY: Past Surgical History:  Procedure Laterality Date   CHOLECYSTECTOMY N/A 02/20/2018   Procedure: LAPAROSCOPIC CHOLECYSTECTOMY;  Surgeon: Glenna Fellows, MD;  Location: [facility];  Service: General;  Laterality: N/A;   COLONOSCOPY  2023   LAPAROSCOPIC RIGHT HEMI COLECTOMY Right 09/14/2021   Procedure: LAPAROSCOPIC TO OPEN RIGHT HEMI COLECTOMY;  Surgeon: Andria Meuse, MD;  Location: [facility];  Service: General;  Laterality: Right;   PORTACATH PLACEMENT N/A 10/15/2021   Procedure: INSERTION PORT-A-CATH;  Surgeon: Andria Meuse, MD;  Location: [facility];  Service: General;  Laterality: N/A;  with ultrasound and flouroscopic guidance    I have reviewed the social history and family history with the patient and they are unchanged from previous note.  ALLERGIES:  has No Known Allergies.  MEDICATIONS:  Current  Outpatient Medications  Medication Sig Dispense Refill   acetaminophen (TYLENOL) 500 MG tablet Take 500-1,000 mg by mouth every 6 (six) hours as needed for moderate pain.     ARTIFICIAL TEAR SOLUTION OP Place 1 drop into both eyes daily as needed (dry eyes).     cyanocobalamin (,VITAMIN B-12,) 1000 MCG/ML injection Inject 1,000 mcg into the muscle every 30 (thirty) days.   0   diphenoxylate-atropine (LOMOTIL) 2.5-0.025 MG tablet Take 1-2 tablets by mouth 4 (four) times daily as needed for diarrhea or  loose stools. 30 tablet 0   Iron, Ferrous Sulfate, 325 (65 Fe) MG TABS Take 325 mg by mouth daily.     levothyroxine (SYNTHROID) 75 MCG tablet Take 75 mcg by mouth daily before breakfast.     lisinopril (PRINIVIL,ZESTRIL) 20 MG tablet Take 20 mg by mouth daily.  3   ondansetron (ZOFRAN) 8 MG tablet Take 1 tablet (8 mg total) by mouth every 8 (eight) hours as needed for nausea or vomiting. 90 tablet 0   polyethylene glycol powder (GLYCOLAX/MIRALAX) 17 GM/SCOOP powder Take 17 g by mouth as needed for mild constipation. (Patient not taking: Reported on 01/07/2022)     potassium chloride SA (KLOR-CON M) 20 MEQ tablet Take 1 tablet (20 mEq total) by mouth 2 (two) times daily. One po bid x 3 days, then one po once a day 60 tablet 0   vitamin C (ASCORBIC ACID) 500 MG tablet Take 500 mg by mouth daily.     No current facility-administered medications for this visit.    PHYSICAL EXAMINATION: ECOG PERFORMANCE STATUS: 1 - Symptomatic but completely ambulatory  Vitals:   08/26/22 1157  BP: (!) 126/53  Pulse: 72  Resp: 15  Temp: 98.1 F (36.7 C)  SpO2: 100%   Wt Readings from Last 3 Encounters:  08/26/22 205 lb 12.8 oz (93.4 kg)  05/27/22 211 lb (95.7 kg)  04/16/22 192 lb 12.8 oz (87.5 kg)     GENERAL:alert, no distress and comfortable SKIN: skin color normal, no rashes or significant lesions EYES: normal, Conjunctiva are pink and non-injected, sclera clear  NEURO: alert & oriented x 3 with fluent speech NECK:(-) supple, thyroid normal size, non-tender, without nodularity LYMPH: (-) no palpable lymphadenopathy in the cervical, axillary  LUNGS:(-) clear to auscultation and percussion with normal breathing effort HEART:(-) regular rate & rhythm and no murmurs and (-) no lower extremity edema ABDOMEN:(-)abdomen soft, (-)non-tender and normal bowel sounds  LABORATORY DATA:  I have reviewed the data as listed    Latest Ref Rng & Units 08/26/2022   11:32 AM 07/08/2022   10:44 AM 05/24/2022    12:18 PM  CBC  WBC 4.0 - 10.5 K/uL 3.5  4.2  4.0   Hemoglobin 13.0 - 17.0 g/dL 9.4  9.9  9.2   Hematocrit 39.0 - 52.0 % 27.2  28.5  26.7   Platelets 150 - 400 K/uL 149  158  150         Latest Ref Rng & Units 08/26/2022   11:32 AM 07/08/2022   10:44 AM 05/24/2022   12:18 PM  CMP  Glucose 70 - 99 mg/dL 782  956  213   BUN 8 - 23 mg/dL 23  24  17    Creatinine 0.61 - 1.24 mg/dL 0.86  5.78  4.69   Sodium 135 - 145 mmol/L 140  139  140   Potassium 3.5 - 5.1 mmol/L 4.0  3.9  3.9   Chloride 98 - 111 mmol/L  109  107  109   CO2 22 - 32 mmol/L 27  27  26    Calcium 8.9 - 10.3 mg/dL 9.0  9.5  8.9   Total Protein 6.5 - 8.1 g/dL 6.1  6.6  6.1   Total Bilirubin 0.3 - 1.2 mg/dL 0.9  1.0  0.6   Alkaline Phos 38 - 126 U/L 90  103  92   AST 15 - 41 U/L 17  20  15    ALT 0 - 44 U/L 13  16  8        RADIOGRAPHIC STUDIES: I have personally reviewed the radiological images as listed and agreed with the findings in the report. No results found.    Orders Placed This Encounter  Procedures   CT CHEST ABDOMEN PELVIS W CONTRAST    Standing Status:   Future    Standing Expiration Date:   08/26/2023    Order Specific Question:   If indicated for the ordered procedure, I authorize the administration of contrast media per Radiology protocol    Answer:   Yes    Order Specific Question:   Does the patient have a contrast media/X-ray dye allergy?    Answer:   No    Order Specific Question:   Preferred imaging location?    Answer:   [facility]    Order Specific Question:   Release to patient    Answer:   Immediate    Order Specific Question:   If indicated for the ordered procedure, I authorize the administration of oral contrast media per Radiology protocol    Answer:   Yes   All questions were answered. The patient knows to call the clinic with any problems, questions or concerns. No barriers to learning was detected. The total time spent in the appointment was 25 minutes.     Malachy Mood,  MD 08/26/2022   Carolin Coy, CMA, am acting as scribe for Malachy Mood, MD.   I have reviewed the above documentation for accuracy and completeness, and I agree with the above.

## 2022-08-26 NOTE — Assessment & Plan Note (Signed)
pT3pN1cM0 stage IIIB, loss of MLH1 and PMS2, BRAF V600E+, MLH1 hypermethylation  -diagnosed 07/2021  -s/p open right colectomy by Dr. Cliffton Asters 09/14/21 -he completed 6 cycles adjuvant FOLFOX 10/22/21 - 01/13/22.  -he completed chemoRT with Xeloda on 03/26/2022 -continue, physical exam and lab test (including CBC, CMP and CEA) every 3 months for the first 2 years, then every 6-12 months, colonoscopy in one year, and surveilliance CT scan every 6-12 month for up to 5 year.  -surveillance CT from 05/24/22 showed NED, new small bilateral pleural effusion, right more than left.  He also has mild lower extremity edema.  Not sure about the etiology of his pleural effusion, will watch it for now, he is asymptomatic. -Continue colon cancer surveillance.  He is due for colonoscopy in June 2024. -Lab and follow-up in 3 months, will repeat a CT scan in 3 months.

## 2022-08-27 ENCOUNTER — Other Ambulatory Visit: Payer: Self-pay

## 2022-08-27 ENCOUNTER — Telehealth: Payer: Self-pay | Admitting: Hematology

## 2022-08-27 LAB — IRON AND IRON BINDING CAPACITY (CC-WL,HP ONLY)
Iron: 74 ug/dL (ref 45–182)
Saturation Ratios: 29 % (ref 17.9–39.5)
TIBC: 255 ug/dL (ref 250–450)
UIBC: 181 ug/dL (ref 117–376)

## 2022-08-27 NOTE — Telephone Encounter (Signed)
Patient is aware of upcoming appointments. °

## 2022-08-28 ENCOUNTER — Other Ambulatory Visit: Payer: Self-pay

## 2022-09-06 DIAGNOSIS — Z136 Encounter for screening for cardiovascular disorders: Secondary | ICD-10-CM | POA: Diagnosis not present

## 2022-09-06 DIAGNOSIS — I10 Essential (primary) hypertension: Secondary | ICD-10-CM | POA: Diagnosis not present

## 2022-09-06 DIAGNOSIS — E119 Type 2 diabetes mellitus without complications: Secondary | ICD-10-CM | POA: Diagnosis not present

## 2022-09-06 DIAGNOSIS — E538 Deficiency of other specified B group vitamins: Secondary | ICD-10-CM | POA: Diagnosis not present

## 2022-09-06 DIAGNOSIS — Z85038 Personal history of other malignant neoplasm of large intestine: Secondary | ICD-10-CM | POA: Diagnosis not present

## 2022-09-06 DIAGNOSIS — N183 Chronic kidney disease, stage 3 unspecified: Secondary | ICD-10-CM | POA: Diagnosis not present

## 2022-09-06 DIAGNOSIS — E039 Hypothyroidism, unspecified: Secondary | ICD-10-CM | POA: Diagnosis not present

## 2022-09-06 DIAGNOSIS — D500 Iron deficiency anemia secondary to blood loss (chronic): Secondary | ICD-10-CM | POA: Diagnosis not present

## 2022-09-28 ENCOUNTER — Other Ambulatory Visit (INDEPENDENT_AMBULATORY_CARE_PROVIDER_SITE_OTHER): Payer: Medicare Other

## 2022-09-28 ENCOUNTER — Encounter: Payer: Self-pay | Admitting: Nurse Practitioner

## 2022-09-28 ENCOUNTER — Ambulatory Visit (INDEPENDENT_AMBULATORY_CARE_PROVIDER_SITE_OTHER): Payer: Medicare Other | Admitting: Nurse Practitioner

## 2022-09-28 VITALS — BP 122/58 | HR 65 | Ht 73.0 in | Wt 202.0 lb

## 2022-09-28 DIAGNOSIS — Z85038 Personal history of other malignant neoplasm of large intestine: Secondary | ICD-10-CM

## 2022-09-28 DIAGNOSIS — D649 Anemia, unspecified: Secondary | ICD-10-CM

## 2022-09-28 DIAGNOSIS — A048 Other specified bacterial intestinal infections: Secondary | ICD-10-CM | POA: Diagnosis not present

## 2022-09-28 DIAGNOSIS — Z86010 Personal history of colonic polyps: Secondary | ICD-10-CM

## 2022-09-28 LAB — CBC
HCT: 32 % — ABNORMAL LOW (ref 39.0–52.0)
Hemoglobin: 11.1 g/dL — ABNORMAL LOW (ref 13.0–17.0)
MCHC: 34.7 g/dL (ref 30.0–36.0)
MCV: 97 fl (ref 78.0–100.0)
Platelets: 169 10*3/uL (ref 150.0–400.0)
RBC: 3.3 Mil/uL — ABNORMAL LOW (ref 4.22–5.81)
RDW: 13.6 % (ref 11.5–15.5)
WBC: 4.3 10*3/uL (ref 4.0–10.5)

## 2022-09-28 MED ORDER — BISMUTH/METRONIDAZ/TETRACYCLIN 140-125-125 MG PO CAPS: 3.0000 | ORAL_CAPSULE | Freq: Three times a day (TID) | ORAL | 0 refills | Status: DC

## 2022-09-28 MED ORDER — NA SULFATE-K SULFATE-MG SULF 17.5-3.13-1.6 GM/177ML PO SOLN: ORAL | 0 refills | Status: DC

## 2022-09-28 MED ORDER — OMEPRAZOLE 20 MG PO CPDR: 20.0000 mg | DELAYED_RELEASE_CAPSULE | Freq: Two times a day (BID) | ORAL | 0 refills | Status: DC

## 2022-09-28 NOTE — Progress Notes (Signed)
I agree with the assessment and plan as outlined by Ms. Kennedy-Smith. 

## 2022-09-28 NOTE — Patient Instructions (Addendum)
You have been scheduled for a colonoscopy. Please follow written instructions given to you at your visit today.   Please pick up your prep supplies at the pharmacy within the next 1-3 days.  If you use inhalers (even only as needed), please bring them with you on the day of your procedure.  DO NOT TAKE 7 DAYS PRIOR TO TEST- Trulicity (dulaglutide) Ozempic, Wegovy (semaglutide) Mounjaro (tirzepatide) Bydureon Bcise (exanatide extended release)  DO NOT TAKE 1 DAY PRIOR TO YOUR TEST Rybelsus (semaglutide) Adlyxin (lixisenatide) Victoza (liraglutide) Byetta (exanatide) _____________________________________________________  Your provider has requested that you go to the basement level for lab work before leaving today. Press "B" on the elevator. The lab is located at the first door on the left as you exit the elevator.  Due to recent changes in healthcare laws, you may see the results of your imaging and laboratory studies on MyChart before your provider has had a chance to review them.  We understand that in some cases there may be results that are confusing or concerning to you. Not all laboratory results come back in the same time frame and the provider may be waiting for multiple results in order to interpret others.  Please give [place] 48 hours in order for your provider to thoroughly review all the results before contacting the office for clarification of your results.   Thank you for trusting me with your gastrointestinal care!   Alcide Evener, CRNP

## 2022-09-28 NOTE — Progress Notes (Signed)
09/28/2022 Joseph Pennington [id number] [birth year]   Chief Complaint: Schedule a colonoscopy   History of Present Illness: Joseph Pennington is an 89 year old male with a past medical history of hypertension, CKD, diabetes mellitus type 2, hypothyroidism and colon cancer.  He was initially seen in office by Dr. Leonides Schanz 05/13/2021 secondary to having melena and red bloody stools.  He underwent an EGD and colonoscopy 07/23/2021. The EGD identified chronic gastritis (H. pylori immunohistochemistry was positive for rare microorganisms, not treated), hyperplastic gastric polyps and peptic duodenitis. The colonoscopy identified a partially obstructing tumor in the ascending colon, path report confirmed poorly differentiated adenocarcinoma and 6 hyperplastic polyps were removed from the rectum and transverse colon. He was referred to general surgery and oncology.  He underwent a right colectomy by Dr. Cliffton Asters 09/14/2021 and completed 6 cycles of adjuvant FOLFOX 8/3 - 01/13/2022, completed chemo RT with Xeloda 03/26/2022.  He is followed by oncologist Dr. Malachy Mood. He presents today to schedule a surveillance colonoscopy.  He denies having any abdominal pain.  He typically passes 3 nonbloody muddy stools daily.  No melena.  No heartburn.  He describes feeling as if food sometimes is slower to pass down his esophagus but food does not get stuck and is somewhat more noticeable since he completed his chemotherapy for colon cancer.  He drinks 1 to 2 cups of coffee daily.  No alcohol use. No weight loss.  He remains fairly active and he is the primary caretaker for his wife who has Parkinson's disease.      Latest Ref Rng & Units 08/26/2022   11:32 AM 07/08/2022   10:44 AM 05/24/2022   12:18 PM  CBC  WBC 4.0 - 10.5 K/uL 3.5  4.2  4.0   Hemoglobin 13.0 - 17.0 g/dL 9.4  9.9  9.2   Hematocrit 39.0 - 52.0 % 27.2  28.5  26.7   Platelets 150 - 400 K/uL 149  158  150        Latest Ref Rng & Units 08/26/2022   11:32 AM  07/08/2022   10:44 AM 05/24/2022   12:18 PM  CMP  Glucose 70 - 99 mg/dL 409  811  914   BUN 8 - 23 mg/dL 23  24  17    Creatinine 0.61 - 1.24 mg/dL 7.82  9.56  2.13   Sodium 135 - 145 mmol/L 140  139  140   Potassium 3.5 - 5.1 mmol/L 4.0  3.9  3.9   Chloride 98 - 111 mmol/L 109  107  109   CO2 22 - 32 mmol/L 27  27  26    Calcium 8.9 - 10.3 mg/dL 9.0  9.5  8.9   Total Protein 6.5 - 8.1 g/dL 6.1  6.6  6.1   Total Bilirubin 0.3 - 1.2 mg/dL 0.9  1.0  0.6   Alkaline Phos 38 - 126 U/L 90  103  92   AST 15 - 41 U/L 17  20  15    ALT 0 - 44 U/L 13  16  8       Chest/abdominal/pelvic surveillance CT 05/24/2022. FINDINGS: CT CHEST FINDINGS   Cardiovascular: Accessed right chest Port-A-Cath tip in the right atrium. Aortic atherosclerosis. Coronary artery calcifications. Normal size heart. No significant pericardial effusion/thickening   Mediastinum/Nodes: No suspicious thyroid nodule. No pathologically enlarged mediastinal, hilar or axillary lymph nodes. The esophagus is grossly unremarkable.   Lungs/Pleura: New moderate right and small left pleural effusion with adjacent atelectasis.  No suspicious pulmonary nodules or masses.   Musculoskeletal: No aggressive lytic or blastic lesion of bone. Diffuse demineralization of bone. Bridging vertebral osteophytes.   CT ABDOMEN PELVIS FINDINGS   Hepatobiliary: No suspicious hepatic lesion. Gallbladder surgically absent. No biliary ductal dilation.   Pancreas: No pancreatic ductal dilation or evidence of acute inflammation.   Spleen: No splenomegaly.   Adrenals/Urinary Tract: Bilateral adrenal glands appear normal. No hydronephrosis. Kidneys demonstrate symmetric enhancement and excretion of contrast material. Urinary bladder is unremarkable for degree of distension.   Stomach/Bowel: Radiopaque enteric contrast material traverses distal loops of small bowel.   Surgical changes of right hemicolectomy with ileocolic anastomotic sutures in  the right upper quadrant. Mild wall thickening of the neo terminal ileum and proximal colon about the anastomosis. Tiny nodules in the adjacent ileocolic mesentery measure up to 5 mm on image 92/2.   No pathologic dilation of small or large bowel.   Vascular/Lymphatic: Aortic atherosclerosis. Smooth IVC contours. No pathologically enlarged abdominal or pelvic lymph nodes.   Reproductive: Prostate is unremarkable.   Other: Trace free fluid in the right pericolic gutter and pelvis. Left inguinal hernia contains fat and nonobstructed portion of sigmoid colon.   Musculoskeletal: No aggressive lytic or blastic lesion of bone. Diffuse demineralization of bone. Multilevel degenerative changes spine. Degenerative change of the bilateral hips and SI joints.   IMPRESSION: 1. Surgical changes of right hemicolectomy with ileocolic anastomotic sutures in the right upper quadrant. Mild wall thickening of the neo terminal ileum and proximal colon about the anastomosis with tiny nodules in the adjacent ileocolic mesentery and trace free fluid, nonspecific and possibly reflecting postsurgical change. Attention on short-term interval follow-up imaging is suggested. 2. No convincing evidence of distant metastatic disease within the chest, abdomen or pelvis. 3. New moderate right and small left pleural effusions with adjacent atelectasis. 4. Left inguinal hernia contains fat and nonobstructed portion of sigmoid colon. 5.  Aortic Atherosclerosis (ICD10-I70.0).   PAST GI PROCEDURES:  EGD 07/23/2021: - Salmon-colored mucosa classified as Barrett's stage C1-M1 per [place] criteria. Biopsied.  - Multiple gastric polyps. Resected and retrieved. - Erythematous mucosa in the antrum. Biopsied.  - Mucosal nodule found in the duodenum. Biopsied.  - Dilated lacteals were found in the duodenum. Biopsied.  - Two non-bleeding angioectasias in the duodenum.  Colonoscopy 07/23/2021: - Likely malignant  partially obstructing tumor in the ascending colon. Biopsied.  - Six 3 to 8 mm polyps in the rectum and in the transverse colon, removed with a cold snare. Resected and retrieved.  - Non-bleeding internal hemorrhoids   1. Duodenum, Biopsy - BENIGN DUODENAL MUCOSA - NO ACUTE INFLAMMATION, VILLOUS BLUNTING OR INCREASED INTRAEPITHELIAL LYMPHOCYTES IDENTIFIED 2. Duodenum, Biopsy, Nodule - PEPTIC DUODENITIS - NO DYSPLASIA OR MALIGNANCY IDENTIFIED 3. Stomach, biopsy - MILD CHRONIC GASTRITIS WITHOUT ACTIVITY - NO INTESTINAL METAPLASIA IDENTIFIED - SEE COMMENT:  Addendum:  H. pylori immunohistochemistry is POSITIVE for RARE microorganisms. 4. Stomach, polyp(s) - HYPERPLASTIC GASTRIC POLYP(S) WITH INTESTINAL METAPLASIA - NO H. PYLORI OR MALIGNANCY IDENTIFIED 5. Esophagogastric junction, biopsy - SQUAMOCOLUMNAR JUNCTION WITH CHRONIC INFLAMMATION - NO INTESTINAL METAPLASIA, DYSPLASIA OR MALIGNANCY IDENTIFIED 6. Rectum, biopsy, and transverse - MULTIPLE FRAGMENTS OF HYPERPLASTIC POLYP(S) 7. Ascending Colon Biopsy, Mass - POORLY DIFFERENTIATED ADENOCARCINOMA  Current Outpatient Medications on File Prior to Visit  Medication Sig Dispense Refill   acetaminophen (TYLENOL) 500 MG tablet Take 500-1,000 mg by mouth every 6 (six) hours as needed for moderate pain.     ARTIFICIAL TEAR SOLUTION OP Place 1  drop into both eyes daily as needed (dry eyes).     cyanocobalamin (,VITAMIN B-12,) 1000 MCG/ML injection Inject 1,000 mcg into the muscle every 30 (thirty) days.   0   levothyroxine (SYNTHROID) 75 MCG tablet Take 75 mcg by mouth daily before breakfast.     lisinopril (PRINIVIL,ZESTRIL) 20 MG tablet Take 20 mg by mouth daily.  3   loperamide (IMODIUM) 2 MG capsule Take by mouth as needed for diarrhea or loose stools.     Prenatal Vit-Fe Fumarate-FA (PRENATAL MULTIVITAMIN) TABS tablet Take 1 tablet by mouth daily at 12 noon.     vitamin C (ASCORBIC ACID) 500 MG tablet Take 500 mg by mouth daily.      [DISCONTINUED] prochlorperazine (COMPAZINE) 10 MG tablet Take 1 tablet (10 mg total) by mouth every 6 (six) hours as needed (Nausea or vomiting). 30 tablet 1   No current facility-administered medications on file prior to visit.   No Known Allergies  Current Medications, Allergies, Past Medical History, Past Surgical History, Family History and Social History were reviewed in [employer] record.  Review of Systems:   Constitutional: Negative for fever, sweats, chills or weight loss.  Respiratory: Negative for shortness of breath.   Cardiovascular: Negative for chest pain, palpitations and leg swelling.  Gastrointestinal: See HPI.  Musculoskeletal: + Knee pain. Neurological: + Balance issues, neuropathy.  No dizziness or headaches.  Physical Exam: BP (!) 122/58   Pulse 65   Ht 6\' 1"  (1.854 m)   Wt 202 lb (91.6 kg)   SpO2 98%   BMI 26.65 kg/m   Wt Readings from Last 3 Encounters:  09/28/22 202 lb (91.6 kg)  08/26/22 205 lb 12.8 oz (93.4 kg)  05/27/22 211 lb (95.7 kg)    General: 89 year old male in no acute distress. Head: Normocephalic and atraumatic. Eyes: No scleral icterus. Conjunctiva pink . Ears: Normal auditory acuity. Mouth: Dentition intact. No ulcers or lesions.  Lungs: Clear throughout to auscultation. Heart: Regular rate and rhythm, no murmur. Abdomen: Soft, nontender and nondistended. No masses or hepatomegaly. Normal bowel sounds x 4 quadrants. Abdominal scar intact. Rectal: Deferred. Musculoskeletal: Symmetrical with no gross deformities. Extremities: Bilateral lower extremities with stasis dermatitis, mild edema to the LLE.  Neurological: Alert oriented x 4.  Speech is clear.  Moves all extremities equally. Psychological: Alert and cooperative. 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  Due for 1 year surveillance colonoscopy. -Colonoscopy benefits and risks discussed including risk with sedation, risk of bleeding, perforation and infection.  I discussed with the patient due to his age he is at higher risk for these complications. -Further recommendations to be determined after colonoscopy completed  H. Pylori gastritis per EGD 07/2021. H. Pylori was not treated at that time as his diagnosis of colon cancer which required a colectomy, chemo and radiation was priority.  -Reviewed EGD biopsy results with Dr. Leonides Schanz, will treat at this time with Pylera 3 tabs po qid x 14 days with Omeprazole 20mg  po bid x 14 days -Check [facility] H. Pylori antigen test 4 weeks after H. Pylori treatment completed -Patient was instructed to contact our office if he is unable to tolerate Pylera treatment  Esophageal dysmotility suspected, patient describes food sometimes passes slower down the esophagus but does not get stuck. He does not wish to pursue a barium swallow study at this juncture. EGD  07/2021 showed chronic inflammation at the EG junction, path report negative for Barrett's esophagus. 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 CTAP 05/2022 showed a normal liver and spleen. -CBC today to ensure blood counts stable for colonoscopy  -Continue follow-up with Dr. Delfino Lovett

## 2022-09-29 ENCOUNTER — Other Ambulatory Visit (HOSPITAL_COMMUNITY): Payer: Self-pay

## 2022-09-30 ENCOUNTER — Telehealth: Payer: Self-pay

## 2022-09-30 ENCOUNTER — Other Ambulatory Visit (HOSPITAL_COMMUNITY): Payer: Self-pay

## 2022-09-30 NOTE — Telephone Encounter (Signed)
*  Gastro  PA request received for Bismuth/Metronidaz/Tetracyclin 140-125-125MG  capsules  PA not submitted due to question of :   The following alternatives are the preferred alternatives: Concomitant use of AMOXICILLIN with CLARITHROMYCIN with one of the following [ESOMEPRAZOLE MAGNESIUM CPDR; LANSOPRAZOLE; OMEPRAZOLE; PANTOPRAZOLE SODIUM TBEC]. Would you like to switch to the provided preferred alternatives?  Key: [id number]

## 2022-09-30 NOTE — Telephone Encounter (Signed)
Glendora Score, ok to cancel order for Pylera. Ok to send in RX for Amoxicillin 500mg  two capsule po bid x 14 days, # 56, no refills. Clarithromycin 500mg  one tab po bid x 14 days # 28, no refills and Omeprazole 20mg  one capsule po bid x 14 days # 28, no refills.  Pls provide patient with a [facility] H. Pylori order/kit to be completed 4 week after he completes the above H. Pylori treatment. THX.

## 2022-10-01 ENCOUNTER — Other Ambulatory Visit: Payer: Self-pay

## 2022-10-01 DIAGNOSIS — A048 Other specified bacterial intestinal infections: Secondary | ICD-10-CM

## 2022-10-01 DIAGNOSIS — Z86010 Personal history of colonic polyps: Secondary | ICD-10-CM

## 2022-10-01 DIAGNOSIS — D649 Anemia, unspecified: Secondary | ICD-10-CM

## 2022-10-01 NOTE — Telephone Encounter (Signed)
Unable to leave message, line rings then disconnects.

## 2022-10-05 ENCOUNTER — Other Ambulatory Visit: Payer: Self-pay

## 2022-10-05 DIAGNOSIS — A048 Other specified bacterial intestinal infections: Secondary | ICD-10-CM

## 2022-10-05 MED ORDER — CLARITHROMYCIN 500 MG PO TABS: 500.0000 mg | ORAL_TABLET | Freq: Two times a day (BID) | ORAL | 0 refills | Status: AC

## 2022-10-05 MED ORDER — AMOXICILLIN 500 MG PO TABS
1000.0000 mg | ORAL_TABLET | Freq: Two times a day (BID) | ORAL | 0 refills | Status: DC
Start: 2022-10-05 — End: 2022-11-29

## 2022-10-05 NOTE — Telephone Encounter (Signed)
Pt made aware of Alcide Evener NP recommendations: Pt notified that prescriptions have been sent to pharmacy to take for 14 days and 1 month after completion of treatment to return to our office for a stool specimen kit to retest for the H Pylori.  Pt verbalized understanding with all questions answered.

## 2022-10-05 NOTE — Telephone Encounter (Signed)
Patient called back regarding medication please call him at [phone] [place] he would be back in  [place] tomorrow afternoon.

## 2022-10-07 ENCOUNTER — Other Ambulatory Visit: Payer: Self-pay

## 2022-10-07 ENCOUNTER — Inpatient Hospital Stay: Payer: Medicare Other | Attending: Nurse Practitioner

## 2022-10-07 DIAGNOSIS — D500 Iron deficiency anemia secondary to blood loss (chronic): Secondary | ICD-10-CM

## 2022-10-07 DIAGNOSIS — E860 Dehydration: Secondary | ICD-10-CM

## 2022-10-07 DIAGNOSIS — Z452 Encounter for adjustment and management of vascular access device: Secondary | ICD-10-CM | POA: Diagnosis not present

## 2022-10-07 DIAGNOSIS — C182 Malignant neoplasm of ascending colon: Secondary | ICD-10-CM | POA: Insufficient documentation

## 2022-10-07 MED ORDER — HEPARIN SOD (PORK) LOCK FLUSH 100 UNIT/ML IV SOLN
500.0000 [IU] | Freq: Once | INTRAVENOUS | Status: AC
  Administered 2022-10-07: 500 [IU]

## 2022-10-07 MED ORDER — SODIUM CHLORIDE 0.9% FLUSH
10.0000 mL | Freq: Once | INTRAVENOUS | Status: AC
  Administered 2022-10-07: 10 mL

## 2022-10-07 NOTE — Telephone Encounter (Signed)
Joseph Pennington, Amoxicillin/Clarithromycin with ppi bid was ordered because Pylera was not covered by his insurance. Pt to start Amox/Clarithromycin and Omeprazole regimen. I think it is ok for him to start this treatment now and proceed with his colonoscopy as scheduled. I will add Dr. Leonides Schanz to this message so she can provide her recommendations. THX.

## 2022-10-07 NOTE — Telephone Encounter (Signed)
Joseph Pennington is calling to get an update on medication regiman. Please advise.

## 2022-10-08 ENCOUNTER — Other Ambulatory Visit (HOSPITAL_COMMUNITY): Payer: Self-pay

## 2022-10-14 ENCOUNTER — Ambulatory Visit (AMBULATORY_SURGERY_CENTER): Payer: Medicare Other | Admitting: Internal Medicine

## 2022-10-14 ENCOUNTER — Encounter: Payer: Self-pay | Admitting: Internal Medicine

## 2022-10-14 VITALS — BP 118/70 | HR 63 | Temp 96.9°F | Resp 10 | Ht 73.0 in | Wt 202.0 lb

## 2022-10-14 DIAGNOSIS — D649 Anemia, unspecified: Secondary | ICD-10-CM

## 2022-10-14 DIAGNOSIS — E039 Hypothyroidism, unspecified: Secondary | ICD-10-CM | POA: Diagnosis not present

## 2022-10-14 DIAGNOSIS — Z860100 Personal history of colon polyps, unspecified: Secondary | ICD-10-CM

## 2022-10-14 DIAGNOSIS — K289 Gastrojejunal ulcer, unspecified as acute or chronic, without hemorrhage or perforation: Secondary | ICD-10-CM

## 2022-10-14 DIAGNOSIS — K633 Ulcer of intestine: Secondary | ICD-10-CM | POA: Diagnosis not present

## 2022-10-14 DIAGNOSIS — L98499 Non-pressure chronic ulcer of skin of other sites with unspecified severity: Secondary | ICD-10-CM

## 2022-10-14 DIAGNOSIS — K529 Noninfective gastroenteritis and colitis, unspecified: Secondary | ICD-10-CM

## 2022-10-14 DIAGNOSIS — Z08 Encounter for follow-up examination after completed treatment for malignant neoplasm: Secondary | ICD-10-CM | POA: Diagnosis not present

## 2022-10-14 DIAGNOSIS — Z85038 Personal history of other malignant neoplasm of large intestine: Secondary | ICD-10-CM | POA: Diagnosis not present

## 2022-10-14 DIAGNOSIS — E119 Type 2 diabetes mellitus without complications: Secondary | ICD-10-CM | POA: Diagnosis not present

## 2022-10-14 DIAGNOSIS — I10 Essential (primary) hypertension: Secondary | ICD-10-CM | POA: Diagnosis not present

## 2022-10-14 MED ORDER — SODIUM CHLORIDE 0.9 % IV SOLN: 500.0000 mL | Freq: Once | INTRAVENOUS | Status: DC

## 2022-10-14 NOTE — Op Note (Signed)
[facility] Patient Name: Joseph Pennington Procedure Date: 10/14/2022 9:50 AM MRN: [id number] Endoscopist: Madelyn Brunner Harlan , , [id number] Age: 89 Referring MD:  Date of Birth: 28-Apr-1931 Gender: Male Account #: [id number] Procedure:                Colonoscopy Indications:              High risk colon cancer surveillance: Personal                            history of colon cancer Medicines:                Monitored Anesthesia Care Procedure:                Pre-Anesthesia Assessment:                           - Prior to the procedure, a History and Physical                            was performed, and patient medications and                            allergies were reviewed. The patient's tolerance of                            previous anesthesia was also reviewed. The risks                            and benefits of the procedure and the sedation                            options and risks were discussed with the patient.                            All questions were answered, and informed consent                            was obtained. Prior Anticoagulants: The patient has                            taken no anticoagulant or antiplatelet agents. ASA                            Grade Assessment: II - A patient with mild systemic                            disease. After reviewing the risks and benefits,                            the patient was deemed in satisfactory condition to                            undergo the procedure.  After obtaining informed consent, the colonoscope                            was passed under direct vision. Throughout the                            procedure, the patient's blood pressure, pulse, and                            oxygen saturations were monitored continuously. The                            Olympus Scope SN: [id number] was introduced through                            the anus and advanced to the the  terminal ileum.                            The [id number]-[id number] Slim SN [id number] was introduced                            through the anus and advanced to the the terminal                            ileum. The colonoscopy was performed without                            difficulty. The patient tolerated the procedure                            well. The quality of the bowel preparation was                            good. The ileocolonic anatomosis, distal ileum, and                            rectum were photographed. Scope In: 9:58:02 AM Scope Out: 10:27:27 AM Scope Withdrawal Time: 0 hours 25 minutes 32 seconds  Total Procedure Duration: 0 hours 29 minutes 25 seconds  Findings:                 Localized mild inflammation characterized by                            congestion (edema), erythema and shallow                            ulcerations was found in the distal ileum. Biopsies                            were taken with a cold forceps for histology.                           There was evidence of a prior end-to-side  ileo-colonic anastomosis in the transverse colon.                            This was patent and was characterized by edema,                            erythema, ulceration and an intact staple line. The                            anastomosis was traversed. Part of the end of the                            colon had an area of stenosis that was visualized                            with the ultraslim colonoscope. There was some                            stenosis into the distal ileum, which was traversed                            with the ultraslim colonoscope. This inflammation                            at the anastomosis was biopsied with a cold forceps                            for histology.                           Non-bleeding internal hemorrhoids were found during                            retroflexion. Complications:            No  immediate complications. Estimated Blood Loss:     Estimated blood loss was minimal. Impression:               - Mild inflammation was found in the ileum                            secondary to ileitis. Biopsied.                           - Patent end-to-side ileo-colonic anastomosis,                            characterized by edema, erythema, ulceration and an                            intact staple line. Biopsied.                           - Non-bleeding internal hemorrhoids. Recommendation:           - Discharge patient to home (with  escort).                           - Await pathology results.                           - The findings and recommendations were discussed                            with the patient. Dr Particia Lather "[place]" [place],  10/14/2022 10:40:29 AM

## 2022-10-14 NOTE — Progress Notes (Signed)
GASTROENTEROLOGY PROCEDURE H&P NOTE   Primary Care Physician: Noberto Retort, MD    Reason for Procedure:  History of colon cancer s/p right hemicolectomy  Plan:    Colonoscopy  Patient is appropriate for endoscopic procedure(s) in the ambulatory ([facility]) setting.  The nature of the procedure, as well as the risks, benefits, and alternatives were carefully and thoroughly reviewed with the patient. Ample time for discussion and questions allowed. The patient understood, was satisfied, and agreed to proceed.     HPI: Joseph Pennington is a 89 y.o. male who presents for colonoscopy for evaluation of history of colon cancer s/p right hemicolectomy .  Patient was most recently seen in the Gastroenterology Clinic on 09/28/22.  No interval change in medical history since that appointment. Please refer to that note for full details regarding GI history and clinical presentation.   Past Medical History:  Diagnosis Date   Acute cholecystitis 02/19/2018   Anemia    Chronic kidney disease    Colon cancer (HCC)    Diabetes mellitus without complication (HCC)    diet controlled   GERD (gastroesophageal reflux disease)    History of blood transfusion 08/2021   History of hiatal hernia    Hypertension    Hypothyroidism    Iron deficiency anemia    Peripheral neuropathy    bilateral feet    Past Surgical History:  Procedure Laterality Date   CHOLECYSTECTOMY N/A 02/20/2018   Procedure: LAPAROSCOPIC CHOLECYSTECTOMY;  Surgeon: Glenna Fellows, MD;  Location: [facility];  Service: General;  Laterality: N/A;   COLONOSCOPY  2023   LAPAROSCOPIC RIGHT HEMI COLECTOMY Right 09/14/2021   Procedure: LAPAROSCOPIC TO OPEN RIGHT HEMI COLECTOMY;  Surgeon: Andria Meuse, MD;  Location: [facility];  Service: General;  Laterality: Right;   PORTACATH PLACEMENT N/A 10/15/2021   Procedure: INSERTION PORT-A-CATH;  Surgeon: Andria Meuse, MD;  Location: [facility];  Service: General;  Laterality: N/A;   with ultrasound and flouroscopic guidance    Prior to Admission medications   Medication Sig Start Date End Date Taking? Authorizing Provider  levothyroxine (SYNTHROID) 75 MCG tablet Take 75 mcg by mouth daily before breakfast. 04/18/21  Yes [provider]  lisinopril (PRINIVIL,ZESTRIL) 20 MG tablet Take 20 mg by mouth daily. 12/20/17  Yes [provider]  OMEPRAZOLE PO Take by mouth daily.   Yes [provider]  Prenatal Vit-Fe Fumarate-FA (PRENATAL MULTIVITAMIN) TABS tablet Take 1 tablet by mouth daily at 12 noon.   Yes [provider]  acetaminophen (TYLENOL) 500 MG tablet Take 500-1,000 mg by mouth every 6 (six) hours as needed for moderate pain.    [provider]  acetaminophen (TYLENOL) 500 MG tablet as needed.    [provider]  amoxicillin (AMOXIL) 500 MG tablet Take 2 tablets (1,000 mg total) by mouth 2 (two) times daily. 10/05/22   Arnaldo Natal, NP  ARTIFICIAL TEAR SOLUTION OP Place 1 drop into both eyes daily as needed (dry eyes).    [provider]  clarithromycin (BIAXIN) 500 MG tablet Take 1 tablet (500 mg total) by mouth 2 (two) times daily for 14 days. 10/05/22 10/19/22  Arnaldo Natal, NP  cyanocobalamin (,VITAMIN B-12,) 1000 MCG/ML injection Inject 1,000 mcg into the muscle every 30 (thirty) days.  12/22/17   [provider]  loperamide (IMODIUM) 2 MG capsule Take by mouth as needed for diarrhea or loose stools.    [provider]  omeprazole (PRILOSEC) 20 MG capsule Take  1 capsule (20 mg total) by mouth 2 (two) times daily for 14 days. 09/28/22 10/12/22  Arnaldo Natal, NP  vitamin C (ASCORBIC ACID) 500 MG tablet Take 500 mg by mouth daily. Patient not taking: Reported on 10/14/2022    [provider]  prochlorperazine (COMPAZINE) 10 MG tablet Take 1 tablet (10 mg total) by mouth every 6 (six) hours as needed (Nausea or vomiting). 10/08/21 11/19/21  Malachy Mood, MD     Current Outpatient Medications  Medication Sig Dispense Refill   levothyroxine (SYNTHROID) 75 MCG tablet Take 75 mcg by mouth daily before breakfast.     lisinopril (PRINIVIL,ZESTRIL) 20 MG tablet Take 20 mg by mouth daily.  3   OMEPRAZOLE PO Take by mouth daily.     Prenatal Vit-Fe Fumarate-FA (PRENATAL MULTIVITAMIN) TABS tablet Take 1 tablet by mouth daily at 12 noon.     acetaminophen (TYLENOL) 500 MG tablet Take 500-1,000 mg by mouth every 6 (six) hours as needed for moderate pain.     acetaminophen (TYLENOL) 500 MG tablet as needed.     amoxicillin (AMOXIL) 500 MG tablet Take 2 tablets (1,000 mg total) by mouth 2 (two) times daily. 56 tablet 0   ARTIFICIAL TEAR SOLUTION OP Place 1 drop into both eyes daily as needed (dry eyes).     clarithromycin (BIAXIN) 500 MG tablet Take 1 tablet (500 mg total) by mouth 2 (two) times daily for 14 days. 28 tablet 0   cyanocobalamin (,VITAMIN B-12,) 1000 MCG/ML injection Inject 1,000 mcg into the muscle every 30 (thirty) days.   0   loperamide (IMODIUM) 2 MG capsule Take by mouth as needed for diarrhea or loose stools.     omeprazole (PRILOSEC) 20 MG capsule Take 1 capsule (20 mg total) by mouth 2 (two) times daily for 14 days. 28 capsule 0   vitamin C (ASCORBIC ACID) 500 MG tablet Take 500 mg by mouth daily. (Patient not taking: Reported on 10/14/2022)     Current Facility-Administered Medications  Medication Dose Route Frequency Provider Last Rate Last Admin   0.9 %  sodium chloride infusion  500 mL Intravenous Once Imogene Burn, MD        Allergies as of 10/14/2022   (No Known Allergies)    Family History  Problem Relation Age of Onset   Rectal cancer Mother    Heart attack Father    Stomach cancer Neg Hx    Colon cancer Neg Hx    Esophageal cancer Neg Hx    Pancreatic cancer Neg Hx     Social History   Socioeconomic History   Marital status: Married    Spouse name: Not on file   Number of children: Not on file   Years of  education: Not on file   Highest education level: Not on file  Occupational History   Not on file  Tobacco Use   Smoking status: Former    Current packs/day: 0.00    Average packs/day: 1 pack/day for 20.0 years (20.0 ttl pk-yrs)    Types: Cigarettes    Start date: 60    Quit date: 89    Years since quitting: 44.5   Smokeless tobacco: Never   Tobacco comments:    Smoked cigarettes x20 years, </= 1 PPD. Quit 45 years ago  Vaping Use   Vaping status: Never Used  Substance and Sexual Activity   Alcohol use: Yes   Drug use: Not Currently   Sexual activity: Not on file  Other  Topics Concern   Not on file  Social History Narrative   Not on file   Social Determinants of Health   Financial Resource Strain: Not on file  Food Insecurity: Not on file  Transportation Needs: Not on file  Physical Activity: Not on file  Stress: Not on file  Social Connections: Not on file  Intimate Partner Violence: Not on file    Physical Exam: Vital signs in last 24 hours: BP (!) 157/89   Pulse 71   Temp (!) 96.9 F (36.1 C)   Ht 6\' 1"  (1.854 m)   Wt 202 lb (91.6 kg)   SpO2 99%   BMI 26.65 kg/m  GEN: NAD EYE: Sclerae anicteric ENT: MMM CV: Non-tachycardic Pulm: No increased WOB GI: Soft NEURO:  Alert & Oriented   Eulah Pont, MD [facility]   10/14/2022 9:14 AM

## 2022-10-14 NOTE — Progress Notes (Signed)
Pt has scabbed left forearm area- from a [place] tree

## 2022-10-14 NOTE — Patient Instructions (Signed)
Await pathology results from biopsies taken today Resume previous diet and continue present medications   YOU HAD AN ENDOSCOPIC PROCEDURE TODAY AT THE [facility]:   Refer to the procedure report that was given to you for any specific questions about what was found during the examination.  If the procedure report does not answer your questions, please call your gastroenterologist to clarify.  If you requested that your care partner not be given the details of your procedure findings, then the procedure report has been included in a sealed envelope for you to review at your convenience later.  YOU SHOULD EXPECT: Some feelings of bloating in the abdomen. Passage of more gas than usual.  Walking can help get rid of the air that was put into your GI tract during the procedure and reduce the bloating. If you had a lower endoscopy (such as a colonoscopy or flexible sigmoidoscopy) you may notice spotting of blood in your stool or on the toilet paper. If you underwent a bowel prep for your procedure, you may not have a normal bowel movement for a few days.  Please Note:  You might notice some irritation and congestion in your nose or some drainage.  This is from the oxygen used during your procedure.  There is no need for concern and it should clear up in a day or so.  SYMPTOMS TO REPORT IMMEDIATELY:  Following lower endoscopy (colonoscopy or flexible sigmoidoscopy):  Excessive amounts of blood in the stool  Significant tenderness or worsening of abdominal pains  Swelling of the abdomen that is new, acute  Fever of 100F or higher  For urgent or emergent issues, a gastroenterologist can be reached at any hour by calling [phone]. Do not use MyChart messaging for urgent concerns.    DIET:  We do recommend a small meal at first, but then you may proceed to your regular diet.  Drink plenty of fluids but you should avoid alcoholic beverages for 24 hours.  ACTIVITY:  You should plan to  take it easy for the rest of today and you should NOT DRIVE or use heavy machinery until tomorrow (because of the sedation medicines used during the test).    FOLLOW UP: Our staff will call the number listed on your records the next business day following your procedure.  We will call around 7:15- 8:00 am to check on you and address any questions or concerns that you may have regarding the information given to you following your procedure. If we do not reach you, we will leave a message.     If any biopsies were taken you will be contacted by phone or by letter within the next 1-3 weeks.  Please call us at [phone] if you have not heard about the biopsies in 3 weeks.    SIGNATURES/CONFIDENTIALITY: You and/or your care partner have signed paperwork which will be entered into your electronic medical record.  These signatures attest to the fact that that the information above on your After Visit Summary has been reviewed and is understood.  Full responsibility of the confidentiality of this discharge information lies with you and/or your care-partner.

## 2022-10-14 NOTE — Progress Notes (Signed)
Called to room to assist during endoscopic procedure.  Patient ID and intended procedure confirmed with present staff. Received instructions for my participation in the procedure from the performing physician.  

## 2022-10-14 NOTE — Progress Notes (Signed)
Vss nas trans to pacu

## 2022-10-15 ENCOUNTER — Telehealth: Payer: Self-pay | Admitting: *Deleted

## 2022-10-15 NOTE — Telephone Encounter (Signed)
  Follow up Call-     10/14/2022    9:04 AM 07/23/2021    2:22 PM  Call back number  Post procedure Call Back phone  # [phone]  Permission to leave phone message Yes Yes     Patient questions:  Do you have a fever, pain , or abdominal swelling? No. Pain Score  0 *  Have you tolerated food without any problems? Yes.    Have you been able to return to your normal activities? Yes.    Do you have any questions about your discharge instructions: Diet   No. Medications  No. Follow up visit  No.  Do you have questions or concerns about your Care? No.  Actions: * If pain score is 4 or above: No action needed, pain <4.

## 2022-10-19 ENCOUNTER — Encounter: Payer: Self-pay | Admitting: Internal Medicine

## 2022-10-19 ENCOUNTER — Telehealth: Payer: Self-pay

## 2022-10-19 NOTE — Telephone Encounter (Signed)
Tried to contact the patient by phone. No answer. No voicemail. I have sent a message to him through his My Chart.

## 2022-10-19 NOTE — Telephone Encounter (Signed)
-----   Message from Imogene Burn sent at 10/14/2022 11:02 AM EDT ----- Elio Forget, I didn't get a chance to talk to him about this today during his colonoscopy, but please call the patient and ask him to start his H pylori treatment if he has not already done so. We will plan to check a Diatherix stool test about a month after his H pylori treatment to see if his H pylori infection is gone. Please check to see if he already has this stool test. If not, he will need to come in to pick it up. Thanks.

## 2022-10-20 NOTE — Telephone Encounter (Signed)
Patient has seen his message in My Chart to start the H Pylori treatment if he has not already done so.

## 2022-10-25 DIAGNOSIS — H40023 Open angle with borderline findings, high risk, bilateral: Secondary | ICD-10-CM | POA: Diagnosis not present

## 2022-10-26 ENCOUNTER — Other Ambulatory Visit: Payer: Self-pay | Admitting: Nurse Practitioner

## 2022-10-26 ENCOUNTER — Other Ambulatory Visit: Payer: Self-pay | Admitting: Hematology

## 2022-10-26 DIAGNOSIS — C182 Malignant neoplasm of ascending colon: Secondary | ICD-10-CM

## 2022-10-26 NOTE — Telephone Encounter (Signed)
Discontinued by Dr. Mosetta Putt 11/19/2021

## 2022-10-27 ENCOUNTER — Other Ambulatory Visit: Payer: Self-pay | Admitting: Nurse Practitioner

## 2022-11-23 ENCOUNTER — Inpatient Hospital Stay: Payer: Medicare Other | Attending: Nurse Practitioner

## 2022-11-23 ENCOUNTER — Ambulatory Visit (HOSPITAL_COMMUNITY)
Admission: RE | Admit: 2022-11-23 | Discharge: 2022-11-23 | Disposition: A | Discharge status: Home / Self Care | Payer: Medicare Other | Source: Ambulatory Visit | Attending: Hematology | Admitting: Hematology

## 2022-11-23 DIAGNOSIS — Z9221 Personal history of antineoplastic chemotherapy: Secondary | ICD-10-CM | POA: Insufficient documentation

## 2022-11-23 DIAGNOSIS — I700 Atherosclerosis of aorta: Secondary | ICD-10-CM | POA: Diagnosis not present

## 2022-11-23 DIAGNOSIS — I2510 Atherosclerotic heart disease of native coronary artery without angina pectoris: Secondary | ICD-10-CM | POA: Diagnosis not present

## 2022-11-23 DIAGNOSIS — C182 Malignant neoplasm of ascending colon: Secondary | ICD-10-CM

## 2022-11-23 DIAGNOSIS — D500 Iron deficiency anemia secondary to blood loss (chronic): Secondary | ICD-10-CM

## 2022-11-23 DIAGNOSIS — Z923 Personal history of irradiation: Secondary | ICD-10-CM | POA: Insufficient documentation

## 2022-11-23 DIAGNOSIS — J90 Pleural effusion, not elsewhere classified: Secondary | ICD-10-CM | POA: Diagnosis not present

## 2022-11-23 DIAGNOSIS — E860 Dehydration: Secondary | ICD-10-CM

## 2022-11-23 DIAGNOSIS — Z85038 Personal history of other malignant neoplasm of large intestine: Secondary | ICD-10-CM | POA: Insufficient documentation

## 2022-11-23 DIAGNOSIS — Z1211 Encounter for screening for malignant neoplasm of colon: Secondary | ICD-10-CM | POA: Diagnosis not present

## 2022-11-23 LAB — CBC WITH DIFFERENTIAL (CANCER CENTER ONLY)
Abs Immature Granulocytes: 0.01 10*3/uL (ref 0.00–0.07)
Basophils Absolute: 0 10*3/uL (ref 0.0–0.1)
Basophils Relative: 0 %
Eosinophils Absolute: 0.1 10*3/uL (ref 0.0–0.5)
Eosinophils Relative: 2 %
HCT: 29.3 % — ABNORMAL LOW (ref 39.0–52.0)
Hemoglobin: 9.9 g/dL — ABNORMAL LOW (ref 13.0–17.0)
Immature Granulocytes: 0 %
Lymphocytes Relative: 13 %
Lymphs Abs: 0.4 10*3/uL — ABNORMAL LOW (ref 0.7–4.0)
MCH: 32.4 pg (ref 26.0–34.0)
MCHC: 33.8 g/dL (ref 30.0–36.0)
MCV: 95.8 fL (ref 80.0–100.0)
Monocytes Absolute: 0.3 10*3/uL (ref 0.1–1.0)
Monocytes Relative: 9 %
Neutro Abs: 2.6 10*3/uL (ref 1.7–7.7)
Neutrophils Relative %: 76 %
Platelet Count: 134 10*3/uL — ABNORMAL LOW (ref 150–400)
RBC: 3.06 MIL/uL — ABNORMAL LOW (ref 4.22–5.81)
RDW: 12.9 % (ref 11.5–15.5)
WBC Count: 3.4 10*3/uL — ABNORMAL LOW (ref 4.0–10.5)
nRBC: 0 % (ref 0.0–0.2)

## 2022-11-23 LAB — IRON AND IRON BINDING CAPACITY (CC-WL,HP ONLY)
Iron: 111 ug/dL (ref 45–182)
Saturation Ratios: 37 % (ref 17.9–39.5)
TIBC: 298 ug/dL (ref 250–450)
UIBC: 187 ug/dL (ref 117–376)

## 2022-11-23 LAB — CMP (CANCER CENTER ONLY)
ALT: 22 U/L (ref 0–44)
AST: 23 U/L (ref 15–41)
Albumin: 3.8 g/dL (ref 3.5–5.0)
Alkaline Phosphatase: 92 U/L (ref 38–126)
Anion gap: 5 (ref 5–15)
BUN: 26 mg/dL — ABNORMAL HIGH (ref 8–23)
CO2: 27 mmol/L (ref 22–32)
Calcium: 9.2 mg/dL (ref 8.9–10.3)
Chloride: 108 mmol/L (ref 98–111)
Creatinine: 1.15 mg/dL (ref 0.61–1.24)
GFR, Estimated: >60 mL/min (ref >60)
Glucose, Bld: 107 mg/dL — ABNORMAL HIGH (ref 70–99)
Potassium: 4.1 mmol/L (ref 3.5–5.1)
Sodium: 140 mmol/L (ref 135–145)
Total Bilirubin: 1.1 mg/dL (ref 0.3–1.2)
Total Protein: 6.3 g/dL — ABNORMAL LOW (ref 6.5–8.1)

## 2022-11-23 LAB — FERRITIN: Ferritin: 50 ng/mL (ref 24–336)

## 2022-11-23 LAB — CEA (ACCESS): CEA (CHCC): <1 ng/mL (ref 0.00–5.00)

## 2022-11-23 MED ORDER — SODIUM CHLORIDE (PF) 0.9 % IJ SOLN
INTRAMUSCULAR | Status: AC
  Filled 2022-11-23: qty 50

## 2022-11-23 MED ORDER — IOHEXOL 300 MG/ML  SOLN
100.0000 mL | Freq: Once | INTRAMUSCULAR | Status: AC | PRN
  Administered 2022-11-23: 100 mL via INTRAVENOUS

## 2022-11-23 MED ORDER — IOHEXOL 9 MG/ML PO SOLN
ORAL | Status: AC
  Filled 2022-11-23: qty 1000

## 2022-11-23 MED ORDER — HEPARIN SOD (PORK) LOCK FLUSH 100 UNIT/ML IV SOLN
INTRAVENOUS | Status: AC
  Filled 2022-11-23: qty 5

## 2022-11-23 MED ORDER — IOHEXOL 9 MG/ML PO SOLN
1000.0000 mL | ORAL | Status: AC
  Administered 2022-11-23: 1000 mL via ORAL

## 2022-11-23 MED ORDER — HEPARIN SOD (PORK) LOCK FLUSH 100 UNIT/ML IV SOLN
500.0000 [IU] | Freq: Once | INTRAVENOUS | Status: AC
  Administered 2022-11-23: 500 [IU] via INTRAVENOUS

## 2022-11-23 MED ORDER — SODIUM CHLORIDE 0.9% FLUSH
10.0000 mL | Freq: Once | INTRAVENOUS | Status: AC
  Administered 2022-11-23: 10 mL

## 2022-11-26 NOTE — Progress Notes (Unsigned)
[name]   Telephone:([phone] Fax:([phone]) [phone]   Clinic Follow up Note   Patient Care Team: Noberto Retort, MD as PCP - General (Family Medicine) Malachy Mood, MD as Consulting Physician (Hematology and Oncology)  Date of Service:  11/29/2022  CHIEF COMPLAINT: f/u of  colon cancer    CURRENT THERAPY:  Surveillance  ASSESSMENT:  Joseph Pennington is a 89 y.o. 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 Cliffton Asters 09/14/21 -he completed 6 cycles adjuvant FOLFOX 10/22/21 - 01/13/22.  -he completed chemoRT with Xeloda on 03/26/2022 -continue, physical exam and lab test (including CBC, CMP and CEA) every 3 months for the first 2 years, then every 6-12 months, colonoscopy in one year, and surveilliance CT scan every 6-12 month for up to 5 year.  -surveillance CT from 05/24/22 showed NED, new small bilateral pleural effusion, right more than left.  He also has mild lower extremity edema.  Not sure about the etiology of his pleural effusion, will watch it for now, he is asymptomatic. -Continue colon cancer surveillance.  He is due for colonoscopy in June 2024. -Surveillance CT scan from November 23, 2022 showed no evidence of recurrence.  I discussed the finding of persistent enteritis and terminal ileum and colon.  His colonoscopy on October 14, 2022 did not show inflammation in terminal ileum and anastomosis, biopsy were negative for malignant cells. -he is clinically doing well, lab reviewed, no clinical concern for recurrence. -Will continue cancer surveillance    PLAN: -I reviewed CT SCAN w/ pt -Continue Surveillance -Continue prenatal vitamin -lab  reviewed -CMP reviewed -repeat CT SCAN in 6 months, will order next visit -repeat lab/flush in 6 weeks -lab/flush and f/u in 3 months  SUMMARY OF ONCOLOGIC HISTORY: Oncology  History Overview Note   Cancer Staging  Primary adenocarcinoma of ascending colon (HCC) Staging form: Colon and Rectum, AJCC 8th Edition - Pathologic stage from 09/14/2021: Stage IIIB (pT3, pN1c, cM0) - Signed by Pollyann Samples, NP on 10/08/2021 Stage prefix: Initial diagnosis Total positive nodes: 0 Histologic grading system: 4 grade system Histologic grade (G): G3 Laterality: Right Lymph-vascular invasion (LVI): LVI not present (absent)/not identified Tumor deposits (TD): Present Perineural invasion (PNI): Absent Microsatellite instability (MSI): Unstable high BRAF mutation: Positive     Ascending colon cancer s/p lap colectomy 09/14/2021  07/23/2021 Procedure   Upper GI Endoscopy/Colonoscopy; Dr. Leonides Schanz  Colonoscopy Impression: - Likely malignant partially obstructing tumor in the ascending colon. Biopsied. - Six 3 to 8 mm polyps in the rectum and in the transverse colon, removed with a cold snare. Resected and retrieved. - Non-bleeding internal hemorrhoids.  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 Biopsied. - Multiple gastric polyps. Resected and retrieved. - Erythematous mucosa in the antrum. Biopsied. - Mucosal nodule found in the duodenum. Biopsied. - Dilated lacteals were found in the duodenum. Biopsied. - Two non-bleeding angioectasias in the duodenum.   07/23/2021 Initial Biopsy   Diagnosis 1. Duodenum, Biopsy - BENIGN DUODENAL MUCOSA - NO ACUTE INFLAMMATION, VILLOUS BLUNTING OR INCREASED INTRAEPITHELIAL LYMPHOCYTES IDENTIFIED 2. Duodenum, Biopsy, Nodule - PEPTIC DUODENITIS - NO DYSPLASIA OR MALIGNANCY IDENTIFIED 3. Stomach, biopsy - MILD CHRONIC GASTRITIS WITHOUT ACTIVITY - NO INTESTINAL METAPLASIA IDENTIFIED - SEE COMMENT 4. Stomach, polyp(s) - HYPERPLASTIC GASTRIC POLYP(S) WITH INTESTINAL METAPLASIA - NO H. PYLORI OR MALIGNANCY IDENTIFIED  5. Esophagogastric junction, biopsy - SQUAMOCOLUMNAR  JUNCTION WITH CHRONIC INFLAMMATION - NO INTESTINAL METAPLASIA, DYSPLASIA OR MALIGNANCY IDENTIFIED 6. Rectum, biopsy, and transverse - MULTIPLE FRAGMENTS OF HYPERPLASTIC POLYP(S) - NO HIGH-GRADE DYSPLASIA OR MALIGNANCY IDENTIFIED 7. Ascending Colon Biopsy, Mass - POORLY DIFFERENTIATED ADENOCARCINOMA - SEE COMMENT  Microscopic Comment 7. By immunohistochemistry, the neoplastic cells are positive for CDX2 (patchy, weak) but negative for p63, p40, CD56, chromogranin and synaptophysin. This immunoprofile is consistent with a poorly differentiated adenocarcinoma.  3. H. pylori immunohistochemistry is POSITIVE for RARE microorganisms.   07/30/2021 Imaging   EXAM: CT CHEST, ABDOMEN, AND PELVIS WITH CONTRAST  IMPRESSION: 1. Masslike region in the ascending colon extending to the level of the hepatic flexure in the right lower quadrant, concerning for malignancy. Associated pericolonic fat stranding and a few prominent lymph nodes are seen adjacent to this segment of colon, suspicious for local infiltration. No distant metastasis is seen. 2. Left inguinal hernia containing nonobstructed colon. 3. Nonspecific small ground-glass opacity in the in the right lower lobe. Attention on follow-up is recommended. 4. Small hiatal hernia. 5. Aortic atherosclerosis and coronary artery calcifications   08/06/2021 Initial Diagnosis   Malignant neoplasm of ascending colon [facility])   Primary adenocarcinoma of ascending colon (HCC)  07/23/2021 Procedure   Colonoscopy by Dr. Leonides Schanz - A frond-like/villous, fungating and ulcerated partially obstructing large mass was found in the ascending colon. There was a point at which the mass could not be bypassed, even with use of an ultraslim colonoscope. It is suspected that this mass encompasses the entire cecum and ascending colon. The mass was circumferential. The mass measured fifteen cm in length. Oozing was present.    07/23/2021 Initial Biopsy   3. H. pylori  immunohistochemistry is POSITIVE for RARE microorganisms.  FINAL DIAGNOSIS Diagnosis 1. Duodenum, Biopsy - BENIGN DUODENAL MUCOSA - NO ACUTE INFLAMMATION, VILLOUS BLUNTING OR INCREASED INTRAEPITHELIAL LYMPHOCYTES IDENTIFIED 2. Duodenum, Biopsy, Nodule - PEPTIC DUODENITIS - NO DYSPLASIA OR MALIGNANCY IDENTIFIED 3. Stomach, biopsy - MILD CHRONIC GASTRITIS WITHOUT ACTIVITY - NO INTESTINAL METAPLASIA IDENTIFIED - SEE COMMENT 4. Stomach, polyp(s) - HYPERPLASTIC GASTRIC POLYP(S) WITH INTESTINAL METAPLASIA - NO H. PYLORI OR MALIGNANCY IDENTIFIED 5. Esophagogastric junction, biopsy - SQUAMOCOLUMNAR JUNCTION WITH CHRONIC INFLAMMATION - NO INTESTINAL METAPLASIA, DYSPLASIA OR MALIGNANCY IDENTIFIED 6. Rectum, biopsy, and transverse - MULTIPLE FRAGMENTS OF HYPERPLASTIC POLYP(S) - NO HIGH-GRADE DYSPLASIA OR MALIGNANCY IDENTIFIED 7. Ascending Colon Biopsy, Mass - POORLY DIFFERENTIATED ADENOCARCINOMA - SEE COMMENT   07/30/2021 Imaging   CT CAP IMPRESSION: 1. Masslike region in the ascending colon extending to the level of the hepatic flexure in the right lower quadrant, concerning for malignancy. Associated pericolonic fat stranding and a few prominent lymph nodes are seen adjacent to this segment of colon, suspicious for local infiltration. No distant metastasis is seen. 2. Left inguinal hernia containing nonobstructed colon. 3. Nonspecific small ground-glass opacity in the in the right lower lobe. Attention on follow-up is recommended. 4. Small hiatal hernia. 5. Aortic atherosclerosis and coronary artery calcifications   09/14/2021 Surgery   PROCEDURE:  Laparoscopic converted to open right hemicolectomy Repair of small bowel serosa x 2     09/14/2021 Pathology Results   A. COLON, RIGHT, HEMI COLECTOMY:  Poorly differentiated adenocarcinoma in the ascending colon with clear margins of resection.  Tumor Size: Circumferential measuring 9.0 cm X 8.0 cm. X 2.5 cm.  Macroscopic Tumor  Perforation: Not identified  Histologic Type: Adenocarcinoma.  Histologic Grade: Grade 3, poorly differentiated.  Multiple Primary Sites: Not applicable  Tumor Extension: Extends through the muscular wall into the pericolonic adipose tissue.  Lymphovascular Invasion: Not identified.  Perineural Invasion: Not identified.  Treatment Effect: No known presurgical therapy.  Margins:       Margin Status for Invasive Carcinoma: All margins are negative for invasive carcinoma       Distance from Invasive Carcinoma to proximal margin: 11 cm.       Distance from invasive carcinoma to distal margin: 12 cm.  Regional Lymph Nodes:       Number of Lymph Nodes with Tumor: None.       Number of Lymph Nodes Examined: Twenty-two (22)  Tumor Deposits: Present (slide A11)  Distant Metastasis:       Distant Site(s) Involved: None known.  Pathologic Stage Classification (pTNM, AJCC 8th Edition): pT3 pN1c  Comments: Omentum is free of neoplastic invasion.                      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LAMN)    09/14/2021 Cancer Staging   Staging form: Colon and Rectum, AJCC 8th Edition - Pathologic stage from 09/14/2021: Stage IIIB (pT3, pN1c, cM0) - Signed by Pollyann Samples, NP on 10/08/2021 Stage prefix: Initial diagnosis Total positive nodes: 0 Histologic grading system: 4 grade system Histologic grade (G): G3 Laterality: Right Lymph-vascular invasion (LVI): LVI not present (absent)/not identified Tumor deposits (TD): Present Perineural invasion (PNI): Absent Microsatellite instability (MSI): Unstable high BRAF mutation: Positive   10/08/2021 Initial Diagnosis   Primary adenocarcinoma of ascending colon (HCC)   10/22/2021 - 11/07/2021 Chemotherapy   Patient is on Treatment Plan : COLORECTAL FOLFOX q14d x 3 months     10/22/2021 -  Chemotherapy   Patient is on Treatment Plan : COLORECTAL FOLFOX q14d x 3 months     11/23/2022 Imaging   CT CHEST ABDOMEN PELVIS W  CONTRAST   IMPRESSION: 1. Status post right hemicolectomy and reanastomosis. No evidence of recurrent or metastatic disease in the chest, abdomen, or pelvis. 2. Diffuse, long segment wall thickening and mucosal hyperenhancement involving the remnant terminal ileum and colon, particularly of the remnant ascending and transverse colon. Inflammatory findings are increased compared to prior examination, particularly those involving the colon. Although findings consistent with nonspecific infectious, inflammatory, or ischemic enterocolitis, appearance and chronicity is in general highly suggestive of Crohn's ileocolitis. 3. Small right pleural effusion, diminished in volume, with resolution of a previously seen small left pleural effusion. 4. Coronary artery disease.      INTERVAL HISTORY:  Joseph Pennington is here for a follow up of  colon cancer. He was last seen by me on 08/26/2022. He presents to the clinic accompanied by wife. Pt state that he feels good. His energy level he state is good and he's able to do things he wants do. Pt state that his diarrhea is getting better. He was on an antibiotic two weeks ago and had some diarrhea.   All other systems were reviewed with the patient and are negative.  MEDICAL HISTORY:  Past Medical History:  Diagnosis Date   Acute cholecystitis 02/19/2018   Anemia    Chronic kidney disease    Colon cancer (HCC)    Diabetes mellitus without complication (HCC)    diet controlled   GERD (gastroesophageal reflux disease)    History of blood transfusion 08/2021   History of hiatal hernia    Hypertension    Hypothyroidism    Iron deficiency anemia    Peripheral neuropathy  bilateral feet    SURGICAL HISTORY: Past Surgical History:  Procedure Laterality Date   CATARACT EXTRACTION Bilateral    CHOLECYSTECTOMY N/A 02/20/2018   Procedure: LAPAROSCOPIC CHOLECYSTECTOMY;  Surgeon: Glenna Fellows, MD;  Location: [facility];  Service: General;   Laterality: N/A;   COLONOSCOPY  2023   LAPAROSCOPIC RIGHT HEMI COLECTOMY Right 09/14/2021   Procedure: LAPAROSCOPIC TO OPEN RIGHT HEMI COLECTOMY;  Surgeon: Andria Meuse, MD;  Location: [facility];  Service: General;  Laterality: Right;   PORTACATH PLACEMENT N/A 10/15/2021   Procedure: INSERTION PORT-A-CATH;  Surgeon: Andria Meuse, MD;  Location: [facility];  Service: General;  Laterality: N/A;  with ultrasound and flouroscopic guidance   UPPER GASTROINTESTINAL ENDOSCOPY      I have reviewed the social history and family history with the patient and they are unchanged from previous note.  ALLERGIES:  has No Known Allergies.  MEDICATIONS:  Current Outpatient Medications  Medication Sig Dispense Refill   acetaminophen (TYLENOL) 500 MG tablet Take 500-1,000 mg by mouth every 6 (six) hours as needed for moderate pain.     acetaminophen (TYLENOL) 500 MG tablet as needed.     ARTIFICIAL TEAR SOLUTION OP Place 1 drop into both eyes daily as needed (dry eyes).     cyanocobalamin (,VITAMIN B-12,) 1000 MCG/ML injection Inject 1,000 mcg into the muscle every 30 (thirty) days.   0   levothyroxine (SYNTHROID) 75 MCG tablet Take 75 mcg by mouth daily before breakfast.     lisinopril (PRINIVIL,ZESTRIL) 20 MG tablet Take 20 mg by mouth daily.  3   loperamide (IMODIUM) 2 MG capsule Take by mouth as needed for diarrhea or loose stools.     omeprazole (PRILOSEC) 20 MG capsule TAKE 1 CAPSULE(20 MG) BY MOUTH TWICE DAILY FOR 14 DAYS 28 capsule 0   OMEPRAZOLE PO Take by mouth daily.     Prenatal Vit-Fe Fumarate-FA (PRENATAL MULTIVITAMIN) TABS tablet Take 1 tablet by mouth daily at 12 noon.     vitamin C (ASCORBIC ACID) 500 MG tablet Take 500 mg by mouth daily. (Patient not taking: Reported on 10/14/2022)     No current facility-administered medications for this visit.    PHYSICAL EXAMINATION: ECOG PERFORMANCE STATUS: 1 - Symptomatic but completely ambulatory  Vitals:   11/29/22 1407  BP: (!)  149/70  Pulse: 60  Resp: 18  Temp: (!) 97.5 F (36.4 C)  SpO2: 100%   Wt Readings from Last 3 Encounters:  11/29/22 208 lb 9.6 oz (94.6 kg)  10/14/22 202 lb (91.6 kg)  09/28/22 202 lb (91.6 kg)     GENERAL:alert, no distress and comfortable SKIN: skin color normal, no rashes or significant lesions EYES: normal, Conjunctiva are pink and non-injected, sclera clear  NEURO: alert & oriented x 3 with fluent speech LABORATORY DATA:  I have reviewed the data as listed    Latest Ref Rng & Units 11/23/2022    9:26 AM 09/28/2022   11:45 AM 08/26/2022   11:32 AM  CBC  WBC 4.0 - 10.5 K/uL 3.4  4.3  3.5   Hemoglobin 13.0 - 17.0 g/dL 9.9  65.7  9.4   Hematocrit 39.0 - 52.0 % 29.3  32.0  27.2   Platelets 150 - 400 K/uL 134  169.0  149         Latest Ref Rng & Units 11/23/2022    9:26 AM 08/26/2022   11:32 AM 07/08/2022   10:44 AM  CMP  Glucose 70 - 99 mg/dL 846  160  122   BUN 8 - 23 mg/dL 26  23  24    Creatinine 0.61 - 1.24 mg/dL 1.61  0.96  0.45   Sodium 135 - 145 mmol/L 140  140  139   Potassium 3.5 - 5.1 mmol/L 4.1  4.0  3.9   Chloride 98 - 111 mmol/L 108  109  107   CO2 22 - 32 mmol/L 27  27  27    Calcium 8.9 - 10.3 mg/dL 9.2  9.0  9.5   Total Protein 6.5 - 8.1 g/dL 6.3  6.1  6.6   Total Bilirubin 0.3 - 1.2 mg/dL 1.1  0.9  1.0   Alkaline Phos 38 - 126 U/L 92  90  103   AST 15 - 41 U/L 23  17  20    ALT 0 - 44 U/L 22  13  16        RADIOGRAPHIC STUDIES: I have personally reviewed the radiological images as listed and agreed with the findings in the report. No results found.    No orders of the defined types were placed in this encounter.  All questions were answered. The patient knows to call the clinic with any problems, questions or concerns. No barriers to learning was detected. The total time spent in the appointment was 25 minutes.     Malachy Mood, MD 11/29/2022   Carolin Coy, CMA, am acting as scribe for Malachy Mood, MD.   I have reviewed the above documentation  for accuracy and completeness, and I agree with the above.

## 2022-11-28 NOTE — Assessment & Plan Note (Signed)
pT3pN1cM0 stage IIIB, loss of MLH1 and PMS2, BRAF V600E+, MLH1 hypermethylation  -diagnosed 07/2021  -s/p open right colectomy by Dr. Cliffton Asters 09/14/21 -he completed 6 cycles adjuvant FOLFOX 10/22/21 - 01/13/22.  -he completed chemoRT with Xeloda on 03/26/2022 -continue, physical exam and lab test (including CBC, CMP and CEA) every 3 months for the first 2 years, then every 6-12 months, colonoscopy in one year, and surveilliance CT scan every 6-12 month for up to 5 year.  -surveillance CT from 05/24/22 showed NED, new small bilateral pleural effusion, right more than left.  He also has mild lower extremity edema.  Not sure about the etiology of his pleural effusion, will watch it for now, he is asymptomatic. -Continue colon cancer surveillance.  He is due for colonoscopy in June 2024. -Surveillance CT scan from November 23, 2022 showed no evidence of recurrence.  I discussed the finding of persistent enteritis and terminal ileum and colon.  His colonoscopy on October 14, 2022 did not show inflammation in terminal ileum and anastomosis, biopsy were negative for malignant cells.

## 2022-11-29 ENCOUNTER — Inpatient Hospital Stay (HOSPITAL_BASED_OUTPATIENT_CLINIC_OR_DEPARTMENT_OTHER): Payer: Medicare Other | Admitting: Hematology

## 2022-11-29 ENCOUNTER — Encounter: Payer: Self-pay | Admitting: Hematology

## 2022-11-29 VITALS — BP 149/70 | HR 60 | Temp 97.5°F | Resp 18 | Ht 73.0 in | Wt 208.6 lb

## 2022-11-29 DIAGNOSIS — C182 Malignant neoplasm of ascending colon: Secondary | ICD-10-CM | POA: Diagnosis not present

## 2022-11-29 DIAGNOSIS — Z85038 Personal history of other malignant neoplasm of large intestine: Secondary | ICD-10-CM | POA: Diagnosis not present

## 2022-11-29 DIAGNOSIS — Z923 Personal history of irradiation: Secondary | ICD-10-CM | POA: Diagnosis not present

## 2022-11-29 DIAGNOSIS — Z9221 Personal history of antineoplastic chemotherapy: Secondary | ICD-10-CM | POA: Diagnosis not present

## 2022-12-01 ENCOUNTER — Other Ambulatory Visit: Payer: Self-pay

## 2022-12-05 DIAGNOSIS — R112 Nausea with vomiting, unspecified: Secondary | ICD-10-CM | POA: Diagnosis not present

## 2022-12-05 DIAGNOSIS — Z049 Encounter for examination and observation for unspecified reason: Secondary | ICD-10-CM | POA: Diagnosis not present

## 2022-12-05 DIAGNOSIS — S0990XA Unspecified injury of head, initial encounter: Secondary | ICD-10-CM | POA: Diagnosis not present

## 2022-12-05 DIAGNOSIS — R197 Diarrhea, unspecified: Secondary | ICD-10-CM | POA: Diagnosis not present

## 2022-12-06 ENCOUNTER — Emergency Department (HOSPITAL_COMMUNITY): Payer: Medicare Other

## 2022-12-06 ENCOUNTER — Other Ambulatory Visit: Payer: Self-pay

## 2022-12-06 ENCOUNTER — Emergency Department (HOSPITAL_COMMUNITY)
Admission: EM | Admit: 2022-12-06 | Discharge: 2022-12-06 | Disposition: A | Discharge status: Home / Self Care | Payer: Medicare Other | Attending: Emergency Medicine | Admitting: Emergency Medicine

## 2022-12-06 DIAGNOSIS — S0990XA Unspecified injury of head, initial encounter: Secondary | ICD-10-CM | POA: Diagnosis present

## 2022-12-06 DIAGNOSIS — W228XXA Striking against or struck by other objects, initial encounter: Secondary | ICD-10-CM | POA: Diagnosis not present

## 2022-12-06 DIAGNOSIS — E860 Dehydration: Secondary | ICD-10-CM | POA: Diagnosis not present

## 2022-12-06 DIAGNOSIS — S0003XA Contusion of scalp, initial encounter: Secondary | ICD-10-CM | POA: Diagnosis not present

## 2022-12-06 DIAGNOSIS — R112 Nausea with vomiting, unspecified: Secondary | ICD-10-CM

## 2022-12-06 DIAGNOSIS — S01112A Laceration without foreign body of left eyelid and periocular area, initial encounter: Secondary | ICD-10-CM | POA: Diagnosis not present

## 2022-12-06 DIAGNOSIS — Z79899 Other long term (current) drug therapy: Secondary | ICD-10-CM | POA: Diagnosis not present

## 2022-12-06 DIAGNOSIS — R55 Syncope and collapse: Secondary | ICD-10-CM | POA: Insufficient documentation

## 2022-12-06 DIAGNOSIS — S82401A Unspecified fracture of shaft of right fibula, initial encounter for closed fracture: Secondary | ICD-10-CM | POA: Diagnosis not present

## 2022-12-06 DIAGNOSIS — Z043 Encounter for examination and observation following other accident: Secondary | ICD-10-CM | POA: Diagnosis not present

## 2022-12-06 DIAGNOSIS — I10 Essential (primary) hypertension: Secondary | ICD-10-CM | POA: Insufficient documentation

## 2022-12-06 DIAGNOSIS — I6523 Occlusion and stenosis of bilateral carotid arteries: Secondary | ICD-10-CM | POA: Diagnosis not present

## 2022-12-06 DIAGNOSIS — M7652 Patellar tendinitis, left knee: Secondary | ICD-10-CM | POA: Diagnosis not present

## 2022-12-06 DIAGNOSIS — S0181XA Laceration without foreign body of other part of head, initial encounter: Secondary | ICD-10-CM

## 2022-12-06 LAB — CBC WITH DIFFERENTIAL/PLATELET
Abs Immature Granulocytes: 0.03 10*3/uL (ref 0.00–0.07)
Basophils Absolute: 0 10*3/uL (ref 0.0–0.1)
Basophils Relative: 0 %
Eosinophils Absolute: 0 10*3/uL (ref 0.0–0.5)
Eosinophils Relative: 0 %
HCT: 39.6 % (ref 39.0–52.0)
Hemoglobin: 12.7 g/dL — ABNORMAL LOW (ref 13.0–17.0)
Immature Granulocytes: 0 %
Lymphocytes Relative: 3 %
Lymphs Abs: 0.2 10*3/uL — ABNORMAL LOW (ref 0.7–4.0)
MCH: 32.8 pg (ref 26.0–34.0)
MCHC: 32.1 g/dL (ref 30.0–36.0)
MCV: 102.3 fL — ABNORMAL HIGH (ref 80.0–100.0)
Monocytes Absolute: 0.7 10*3/uL (ref 0.1–1.0)
Monocytes Relative: 8 %
Neutro Abs: 8.2 10*3/uL — ABNORMAL HIGH (ref 1.7–7.7)
Neutrophils Relative %: 89 %
Platelets: 149 10*3/uL — ABNORMAL LOW (ref 150–400)
RBC: 3.87 MIL/uL — ABNORMAL LOW (ref 4.22–5.81)
RDW: 13 % (ref 11.5–15.5)
WBC: 9.2 10*3/uL (ref 4.0–10.5)
nRBC: 0 % (ref 0.0–0.2)

## 2022-12-06 LAB — COMPREHENSIVE METABOLIC PANEL
ALT: 19 U/L (ref 0–44)
AST: 26 U/L (ref 15–41)
Albumin: 4.5 g/dL (ref 3.5–5.0)
Alkaline Phosphatase: 84 U/L (ref 38–126)
Anion gap: 12 (ref 5–15)
BUN: 32 mg/dL — ABNORMAL HIGH (ref 8–23)
CO2: 20 mmol/L — ABNORMAL LOW (ref 22–32)
Calcium: 9.6 mg/dL (ref 8.9–10.3)
Chloride: 105 mmol/L (ref 98–111)
Creatinine, Ser: 1.33 mg/dL — ABNORMAL HIGH (ref 0.61–1.24)
GFR, Estimated: 51 mL/min — ABNORMAL LOW (ref >60)
Glucose, Bld: 151 mg/dL — ABNORMAL HIGH (ref 70–99)
Potassium: 4.5 mmol/L (ref 3.5–5.1)
Sodium: 137 mmol/L (ref 135–145)
Total Bilirubin: 1.5 mg/dL — ABNORMAL HIGH (ref 0.3–1.2)
Total Protein: 7.9 g/dL (ref 6.5–8.1)

## 2022-12-06 LAB — CBG MONITORING, ED: Glucose-Capillary: 135 mg/dL — ABNORMAL HIGH (ref 70–99)

## 2022-12-06 MED ORDER — SODIUM CHLORIDE 0.9 % IV BOLUS
500.0000 mL | Freq: Once | INTRAVENOUS | Status: AC
  Administered 2022-12-06: 500 mL via INTRAVENOUS

## 2022-12-06 MED ORDER — BACITRACIN ZINC 500 UNIT/GM EX OINT
TOPICAL_OINTMENT | Freq: Two times a day (BID) | CUTANEOUS | Status: DC
  Administered 2022-12-06: 1 via TOPICAL
  Filled 2022-12-06: qty 0.9

## 2022-12-06 MED ORDER — LIDOCAINE-EPINEPHRINE-TETRACAINE (LET) TOPICAL GEL
3.0000 mL | Freq: Once | TOPICAL | Status: AC
  Administered 2022-12-06: 3 mL via TOPICAL
  Filled 2022-12-06: qty 3

## 2022-12-06 MED ORDER — BACITRACIN ZINC 500 UNIT/GM EX OINT: TOPICAL_OINTMENT | Freq: Two times a day (BID) | CUTANEOUS | Status: DC

## 2022-12-06 MED ORDER — ACETAMINOPHEN 325 MG PO TABS
650.0000 mg | ORAL_TABLET | Freq: Once | ORAL | Status: AC
  Administered 2022-12-06: 650 mg via ORAL
  Filled 2022-12-06: qty 2

## 2022-12-06 NOTE — ED Triage Notes (Signed)
Pt BIB [facility] from home following syncopal + fall. Sts having NVD after dinner yesterday around 4 pm. Went to get up to take of his boots off subsequently had an syncopal episode. Pt presents with facial injuries and skin tear left wrist.. Per EMS, wife at home has mobility issues, suspected pt was approx down for 1 hour. Refused c-collar in route. Denies CP/SOB/Abd pain. Given 4 zofran IM. No blood thinner.

## 2022-12-06 NOTE — Discharge Instructions (Addendum)
You had 1 dissolvable suture placed in your left eyebrow.  The sutures should dissolve in the next week.  If it does not dissolve on its own you may go to urgent care, your family doctor's office for the emergency department to have this removed.   Get rechecked if you have any new or concerning symptoms.

## 2022-12-06 NOTE — ED Provider Notes (Signed)
[place] EMERGENCY DEPARTMENT AT [facility] Provider Note   CSN: [id number] Arrival date & time: 12/06/22  0012     History  Chief Complaint  Patient presents with   Loss of Consciousness   Fall    Joseph Pennington is a 89 y.o. male.  The history is provided by the patient and medical records.  Loss of Consciousness Fall  Joseph Pennington is a 89 y.o. male who presents to the Emergency Department complaining of syncope.  He presents to the emergency department by EMS following a syncopal episode.  He states that he ate dinner and shortly thereafter he developed 1 episode of emesis and 4 episodes of diarrhea, nonbloody.  He was attempting to undo his boot when he had a syncopal episode in the bathroom and struck his head.  Unclear how long he passed out or how long he was on the floor.  EMS states potentially 1 hour, patient feels like it was likely less than this.  He has no complaints of fevers, chest pain, abdominal pain, extremity pain.  He denies any headache but states he can feel abrasions on his face.  He has a history of stage III adenocarcinoma, status postresection and chemotherapy currently on surveillance, hypertension.    Home Medications Prior to Admission medications   Medication Sig Start Date End Date Taking? Authorizing Provider  ARTIFICIAL TEAR SOLUTION OP Place 1 drop into both eyes daily as needed (dry eyes).   Yes [provider]  cyanocobalamin (,VITAMIN B-12,) 1000 MCG/ML injection Inject 1,000 mcg into the muscle every 30 (thirty) days.  12/22/17  Yes [provider]  levothyroxine (SYNTHROID) 75 MCG tablet Take 75 mcg by mouth daily before breakfast. 04/18/21  Yes [provider]  lisinopril (PRINIVIL,ZESTRIL) 20 MG tablet Take 20 mg by mouth daily. 12/20/17  Yes [provider]  loperamide (IMODIUM) 2 MG capsule Take 2 mg by mouth daily as needed for diarrhea or loose stools.   Yes [provider]   Prenatal Vit-Fe Fumarate-FA (PRENATAL MULTIVITAMIN) TABS tablet Take 1 tablet by mouth daily at 12 noon.   Yes [provider]  omeprazole (PRILOSEC) 20 MG capsule TAKE 1 CAPSULE(20 MG) BY MOUTH TWICE DAILY FOR 14 DAYS Patient not taking: Reported on 12/06/2022 10/28/22   Arnaldo Natal, NP  prochlorperazine (COMPAZINE) 10 MG tablet Take 1 tablet (10 mg total) by mouth every 6 (six) hours as needed (Nausea or vomiting). 10/08/21 11/19/21  Malachy Mood, MD      Allergies    Patient has no known allergies.    Review of Systems   Review of Systems  Cardiovascular:  Positive for syncope.  All other systems reviewed and are negative.   Physical Exam Updated Vital Signs BP (!) 108/54   Pulse 66   Temp 97.7 F (36.5 C) (Oral)   Resp (!) 9   SpO2 97%  Physical Exam Vitals and nursing note reviewed.  Constitutional:      Appearance: He is well-developed.  HENT:     Head: Normocephalic.     Comments: Abrasion to the left eyebrow.  There is dried blood in the nares with swelling and tenderness over the nasal bridge, no septal hematoma.  There is swelling over the left upper and left lower lip without any dental instability. Cardiovascular:     Rate and Rhythm: Normal rate and regular rhythm.     Heart sounds: No murmur heard. Pulmonary:     Effort: Pulmonary effort  is normal. No respiratory distress.     Breath sounds: Normal breath sounds.  Abdominal:     Palpations: Abdomen is soft.     Tenderness: There is no abdominal tenderness. There is no guarding or rebound.  Musculoskeletal:     Comments: Skin tear over the left dorsal wrist without local tenderness, he is fully able to range the wrist.  He has an abrasion over the left knee without local tenderness.  No hip tenderness to palpation.  Skin:    General: Skin is warm and dry.  Neurological:     Mental Status: He is alert and oriented to person, place, and time.  Psychiatric:        Behavior: Behavior normal.      ED Results / Procedures / Treatments   Labs (all labs ordered are listed, but only abnormal results are displayed) Labs Reviewed  COMPREHENSIVE METABOLIC PANEL - Abnormal; Notable for the following components:      Result Value   CO2 20 (*)    Glucose, Bld 151 (*)    BUN 32 (*)    Creatinine, Ser 1.33 (*)    Total Bilirubin 1.5 (*)    GFR, Estimated 51 (*)    All other components within normal limits  CBC WITH DIFFERENTIAL/PLATELET - Abnormal; Notable for the following components:   RBC 3.87 (*)    Hemoglobin 12.7 (*)    MCV 102.3 (*)    Platelets 149 (*)    Neutro Abs 8.2 (*)    Lymphs Abs 0.2 (*)    All other components within normal limits  CBG MONITORING, ED - Abnormal; Notable for the following components:   Glucose-Capillary 135 (*)    All other components within normal limits    EKG EKG Interpretation Date/Time:  Monday December 06 2022 00:19:47 EDT Ventricular Rate:  79 PR Interval:  213 QRS Duration:  100 QT Interval:  405 QTC Calculation: 465 R Axis:   58  Text Interpretation: Sinus rhythm Confirmed by Tilden Fossa [phone]) on 12/06/2022 1:51:10 AM  Radiology DG Knee Complete 4 Views Left  Result Date: 12/06/2022 CLINICAL DATA:  Fall. EXAM: LEFT KNEE - COMPLETE 4 VIEW COMPARISON:  None Available. FINDINGS: No evidence of fracture, dislocation, or joint effusion. Patellar enthesophytes. No notable marginal spurring. Subjective osteopenia. IMPRESSION: No acute finding. Electronically Signed   By: Tiburcio Pea M.D.   On: 12/06/2022 05:21   DG Knee Complete 4 Views Right  Result Date: 12/06/2022 CLINICAL DATA:  Fall. EXAM: RIGHT KNEE - COMPLETE 4 VIEW COMPARISON:  None Available. FINDINGS: No evidence of fracture, dislocation, or joint effusion. Remote fracture at the upper fibular shaft, healed. Subjective osteopenia. Atherosclerosis. No notable degenerative change, especially for age. IMPRESSION: No acute finding. Electronically Signed   By: Tiburcio Pea M.D.   On: 12/06/2022 05:20   DG Pelvis 1-2 Views  Result Date: 12/06/2022 CLINICAL DATA:  Fall EXAM: PELVIS - 1-2 VIEW COMPARISON:  None Available. FINDINGS: There is no evidence of pelvic fracture or diastasis. No pelvic bone lesions are seen. IMPRESSION: Negative. Electronically Signed   By: Deatra Robinson M.D.   On: 12/06/2022 01:20   DG Chest Port 1 View  Result Date: 12/06/2022 CLINICAL DATA:  Syncope EXAM: PORTABLE CHEST 1 VIEW COMPARISON:  10/16/2021 FINDINGS: The heart size and mediastinal contours are within normal limits. Both lungs are clear. The visualized skeletal structures are unremarkable. There is a right chest wall power-injectable Port-A-Cath with tip in the proximal right atrium  via a right internal jugular vein approach. IMPRESSION: No active disease. Electronically Signed   By: Deatra Robinson M.D.   On: 12/06/2022 01:19   CT Maxillofacial WO CM  Result Date: 12/06/2022 CLINICAL DATA:  Syncope with fall EXAM: CT HEAD WITHOUT CONTRAST CT MAXILLOFACIAL WITHOUT CONTRAST CT CERVICAL SPINE WITHOUT CONTRAST TECHNIQUE: Multidetector CT imaging of the head, cervical spine, and maxillofacial structures were performed using the standard protocol without intravenous contrast. Multiplanar CT image reconstructions of the cervical spine and maxillofacial structures were also generated. RADIATION DOSE REDUCTION: This exam was performed according to the departmental dose-optimization program which includes automated exposure control, adjustment of the mA and/or kV according to patient size and/or use of iterative reconstruction technique. COMPARISON:  None Available. FINDINGS: CT HEAD FINDINGS Brain: There is no mass, hemorrhage or extra-axial collection. There is hypoattenuation of the supratentorial white matter. CSF spaces are normal. Vascular: There is atherosclerotic calcification of both internal carotid arteries at the skull base. Skull: Normal. Negative for fracture or focal lesion.  Other: None. CT MAXILLOFACIAL FINDINGS Osseous: No fracture or mandibular dislocation. No destructive process. Orbits: Negative. No traumatic or inflammatory finding. Sinuses: Minimal right maxillary sinus opacification Soft tissues: Small left frontal scalp hematoma. CT CERVICAL SPINE FINDINGS Alignment: Normal. Skull base and vertebrae: No acute fracture. No primary bone lesion or focal pathologic process. Soft tissues and spinal canal: No prevertebral fluid or swelling. No visible canal hematoma. Disc levels:  No spinal canal stenosis. Upper chest: Clear Other: None IMPRESSION: 1. No acute intracranial abnormality. 2. Small left frontal scalp hematoma without facial fracture. 3. No acute fracture or static subluxation of the cervical spine. Electronically Signed   By: Deatra Robinson M.D.   On: 12/06/2022 01:18   CT Head Wo Contrast  Result Date: 12/06/2022 CLINICAL DATA:  Syncope with fall EXAM: CT HEAD WITHOUT CONTRAST CT MAXILLOFACIAL WITHOUT CONTRAST CT CERVICAL SPINE WITHOUT CONTRAST TECHNIQUE: Multidetector CT imaging of the head, cervical spine, and maxillofacial structures were performed using the standard protocol without intravenous contrast. Multiplanar CT image reconstructions of the cervical spine and maxillofacial structures were also generated. RADIATION DOSE REDUCTION: This exam was performed according to the departmental dose-optimization program which includes automated exposure control, adjustment of the mA and/or kV according to patient size and/or use of iterative reconstruction technique. COMPARISON:  None Available. FINDINGS: CT HEAD FINDINGS Brain: There is no mass, hemorrhage or extra-axial collection. There is hypoattenuation of the supratentorial white matter. CSF spaces are normal. Vascular: There is atherosclerotic calcification of both internal carotid arteries at the skull base. Skull: Normal. Negative for fracture or focal lesion. Other: None. CT MAXILLOFACIAL FINDINGS Osseous:  No fracture or mandibular dislocation. No destructive process. Orbits: Negative. No traumatic or inflammatory finding. Sinuses: Minimal right maxillary sinus opacification Soft tissues: Small left frontal scalp hematoma. CT CERVICAL SPINE FINDINGS Alignment: Normal. Skull base and vertebrae: No acute fracture. No primary bone lesion or focal pathologic process. Soft tissues and spinal canal: No prevertebral fluid or swelling. No visible canal hematoma. Disc levels:  No spinal canal stenosis. Upper chest: Clear Other: None IMPRESSION: 1. No acute intracranial abnormality. 2. Small left frontal scalp hematoma without facial fracture. 3. No acute fracture or static subluxation of the cervical spine. Electronically Signed   By: Deatra Robinson M.D.   On: 12/06/2022 01:18   CT Cervical Spine Wo Contrast  Result Date: 12/06/2022 CLINICAL DATA:  Syncope with fall EXAM: CT HEAD WITHOUT CONTRAST CT MAXILLOFACIAL WITHOUT CONTRAST CT CERVICAL SPINE WITHOUT CONTRAST  TECHNIQUE: Multidetector CT imaging of the head, cervical spine, and maxillofacial structures were performed using the standard protocol without intravenous contrast. Multiplanar CT image reconstructions of the cervical spine and maxillofacial structures were also generated. RADIATION DOSE REDUCTION: This exam was performed according to the departmental dose-optimization program which includes automated exposure control, adjustment of the mA and/or kV according to patient size and/or use of iterative reconstruction technique. COMPARISON:  None Available. FINDINGS: CT HEAD FINDINGS Brain: There is no mass, hemorrhage or extra-axial collection. There is hypoattenuation of the supratentorial white matter. CSF spaces are normal. Vascular: There is atherosclerotic calcification of both internal carotid arteries at the skull base. Skull: Normal. Negative for fracture or focal lesion. Other: None. CT MAXILLOFACIAL FINDINGS Osseous: No fracture or mandibular dislocation.  No destructive process. Orbits: Negative. No traumatic or inflammatory finding. Sinuses: Minimal right maxillary sinus opacification Soft tissues: Small left frontal scalp hematoma. CT CERVICAL SPINE FINDINGS Alignment: Normal. Skull base and vertebrae: No acute fracture. No primary bone lesion or focal pathologic process. Soft tissues and spinal canal: No prevertebral fluid or swelling. No visible canal hematoma. Disc levels:  No spinal canal stenosis. Upper chest: Clear Other: None IMPRESSION: 1. No acute intracranial abnormality. 2. Small left frontal scalp hematoma without facial fracture. 3. No acute fracture or static subluxation of the cervical spine. Electronically Signed   By: Deatra Robinson M.D.   On: 12/06/2022 01:18    Procedures .Marland KitchenLaceration Repair  Date/Time: 12/06/2022 6:12 AM  Performed by: Tilden Fossa, MD Authorized by: Tilden Fossa, MD   Consent:    Consent obtained:  Verbal   Consent given by:  Patient   Risks discussed:  Infection, pain and poor cosmetic result Universal protocol:    Patient identity confirmed:  Verbally with patient Laceration details:    Location:  Face   Face location:  L eyebrow   Length (cm):  1 Exploration:    Hemostasis achieved with:  LET and direct pressure Treatment:    Area cleansed with:  Chlorhexidine   Amount of cleaning:  Standard   Irrigation solution:  Sterile saline   Debridement:  None Skin repair:    Repair method:  Sutures   Suture size:  4-0   Wound skin closure material used: vicryl.   Suture technique:  Simple interrupted   Number of sutures:  1 Approximation:    Approximation:  Close Repair type:    Repair type:  Simple Post-procedure details:    Dressing:  Antibiotic ointment   Procedure completion:  Tolerated well, no immediate complications     Medications Ordered in ED Medications  bacitracin ointment (1 Application Topical Given 12/06/22 0609)  sodium chloride 0.9 % bolus 500 mL (0 mLs Intravenous  Stopped 12/06/22 0433)  lidocaine-EPINEPHrine-tetracaine (LET) topical gel (3 mLs Topical Given 12/06/22 0450)  acetaminophen (TYLENOL) tablet 650 mg (650 mg Oral Given 12/06/22 0451)  sodium chloride 0.9 % bolus 500 mL (500 mLs Intravenous New Bag/Given 12/06/22 0608)    ED Course/ Medical Decision Making/ A&P                                 Medical Decision Making Amount and/or Complexity of Data Reviewed Labs: ordered. Radiology: ordered.  Risk OTC drugs.  Patient with history of stage III adenocarcinoma of the colon here for evaluation of facial injuries, syncopal event in setting of nausea, vomiting, diarrhea.  In terms of his vomiting and diarrhea this  is completely resolved.  He does have multiple contusions to his face, small laceration, which was repaired per note.  Labs with mild elevation in his BUN and creatinine compared to baseline, consistent with dehydration.  History with IV fluids.  After treatment in the emergency department he is feeling improved, tolerating p.o. and able to ambulate.  Feel he is stable for discharge home with outpatient follow-up and return precautions.  Current clinical picture is not consistent with PE, ACS, arrhythmia.         Final Clinical Impression(s) / ED Diagnoses Final diagnoses:  Nausea vomiting and diarrhea  Dehydration  Syncope and collapse  Facial laceration, initial encounter    Rx / DC Orders ED Discharge Orders     None         Tilden Fossa, MD 12/06/22 [phone]

## 2022-12-06 NOTE — ED Notes (Signed)
Pt tolerate PO fluids well.

## 2022-12-06 NOTE — ED Notes (Addendum)
Patient was ambulated and stated that he was feeling a light headache, and a sore feeling on his left knee.

## 2022-12-13 DIAGNOSIS — S50812A Abrasion of left forearm, initial encounter: Secondary | ICD-10-CM | POA: Diagnosis not present

## 2022-12-13 DIAGNOSIS — S60812A Abrasion of left wrist, initial encounter: Secondary | ICD-10-CM | POA: Diagnosis not present

## 2022-12-14 ENCOUNTER — Other Ambulatory Visit: Payer: Self-pay

## 2023-01-10 ENCOUNTER — Inpatient Hospital Stay: Payer: Medicare Other | Attending: Nurse Practitioner

## 2023-01-10 ENCOUNTER — Other Ambulatory Visit: Payer: Self-pay | Admitting: Hematology

## 2023-01-10 DIAGNOSIS — Z85038 Personal history of other malignant neoplasm of large intestine: Secondary | ICD-10-CM | POA: Diagnosis not present

## 2023-01-10 DIAGNOSIS — D500 Iron deficiency anemia secondary to blood loss (chronic): Secondary | ICD-10-CM

## 2023-01-10 DIAGNOSIS — C182 Malignant neoplasm of ascending colon: Secondary | ICD-10-CM

## 2023-01-10 DIAGNOSIS — Z452 Encounter for adjustment and management of vascular access device: Secondary | ICD-10-CM | POA: Insufficient documentation

## 2023-01-10 DIAGNOSIS — E860 Dehydration: Secondary | ICD-10-CM

## 2023-01-10 LAB — CBC WITH DIFFERENTIAL (CANCER CENTER ONLY)
Abs Immature Granulocytes: 0.01 10*3/uL (ref 0.00–0.07)
Basophils Absolute: 0 10*3/uL (ref 0.0–0.1)
Basophils Relative: 0 %
Eosinophils Absolute: 0.1 10*3/uL (ref 0.0–0.5)
Eosinophils Relative: 2 %
HCT: 29.6 % — ABNORMAL LOW (ref 39.0–52.0)
Hemoglobin: 10.2 g/dL — ABNORMAL LOW (ref 13.0–17.0)
Immature Granulocytes: 0 %
Lymphocytes Relative: 14 %
Lymphs Abs: 0.5 10*3/uL — ABNORMAL LOW (ref 0.7–4.0)
MCH: 33.2 pg (ref 26.0–34.0)
MCHC: 34.5 g/dL (ref 30.0–36.0)
MCV: 96.4 fL (ref 80.0–100.0)
Monocytes Absolute: 0.3 10*3/uL (ref 0.1–1.0)
Monocytes Relative: 9 %
Neutro Abs: 2.4 10*3/uL (ref 1.7–7.7)
Neutrophils Relative %: 75 %
Platelet Count: 128 10*3/uL — ABNORMAL LOW (ref 150–400)
RBC: 3.07 MIL/uL — ABNORMAL LOW (ref 4.22–5.81)
RDW: 13 % (ref 11.5–15.5)
WBC Count: 3.2 10*3/uL — ABNORMAL LOW (ref 4.0–10.5)
nRBC: 0 % (ref 0.0–0.2)

## 2023-01-10 LAB — CMP (CANCER CENTER ONLY)
ALT: 24 U/L (ref 0–44)
AST: 26 U/L (ref 15–41)
Albumin: 3.8 g/dL (ref 3.5–5.0)
Alkaline Phosphatase: 90 U/L (ref 38–126)
Anion gap: 5 (ref 5–15)
BUN: 23 mg/dL (ref 8–23)
CO2: 26 mmol/L (ref 22–32)
Calcium: 9.3 mg/dL (ref 8.9–10.3)
Chloride: 110 mmol/L (ref 98–111)
Creatinine: 1.13 mg/dL (ref 0.61–1.24)
GFR, Estimated: >60 mL/min (ref >60)
Glucose, Bld: 116 mg/dL — ABNORMAL HIGH (ref 70–99)
Potassium: 4.2 mmol/L (ref 3.5–5.1)
Sodium: 141 mmol/L (ref 135–145)
Total Bilirubin: 0.9 mg/dL (ref 0.3–1.2)
Total Protein: 6.2 g/dL — ABNORMAL LOW (ref 6.5–8.1)

## 2023-01-10 MED ORDER — SODIUM CHLORIDE 0.9% FLUSH
10.0000 mL | Freq: Once | INTRAVENOUS | Status: AC
  Administered 2023-01-10: 10 mL

## 2023-01-10 MED ORDER — HEPARIN SOD (PORK) LOCK FLUSH 100 UNIT/ML IV SOLN
500.0000 [IU] | Freq: Once | INTRAVENOUS | Status: AC
Start: 2023-01-10 — End: 2023-01-10
  Administered 2023-01-10: 500 [IU]

## 2023-01-19 ENCOUNTER — Telehealth: Payer: Self-pay | Admitting: Nurse Practitioner

## 2023-01-19 ENCOUNTER — Other Ambulatory Visit: Payer: Self-pay

## 2023-01-19 NOTE — Telephone Encounter (Signed)
Patient called stated he received a MyChart message that he is do pick up a stool sample kit but is not sure of who sent it or if the message is from our office. Please advise.

## 2023-01-19 NOTE — Telephone Encounter (Signed)
Chart reviewed. Spoke with Pt wife and notified her that we do not have any labs stool kits that are pending. Pt wife verbalized understanding with all questions answered.

## 2023-01-20 DIAGNOSIS — Z23 Encounter for immunization: Secondary | ICD-10-CM | POA: Diagnosis not present

## 2023-02-27 NOTE — Assessment & Plan Note (Signed)
pT3pN1cM0 stage IIIB, loss of MLH1 and PMS2, BRAF V600E+, MLH1 hypermethylation  -diagnosed 07/2021  -s/p open right colectomy by Dr. Cliffton Asters 09/14/21 -he completed 6 cycles adjuvant FOLFOX 10/22/21 - 01/13/22.  -he completed chemoRT with Xeloda on 03/26/2022 -continue, physical exam and lab test (including CBC, CMP and CEA) every 3 months for the first 2 years, then every 6-12 months, colonoscopy in one year, and surveilliance CT scan every 6-12 month for up to 5 year.  -surveillance CT from 05/24/22 showed NED, new small bilateral pleural effusion, right more than left.  He also has mild lower extremity edema.  Not sure about the etiology of his pleural effusion, will watch it for now, he is asymptomatic. -Continue colon cancer surveillance.  He is due for colonoscopy in June 2024. -Surveillance CT scan from November 23, 2022 showed no evidence of recurrence.  I discussed the finding of persistent enteritis and terminal ileum and colon.  His colonoscopy on October 14, 2022 did not show inflammation in terminal ileum and anastomosis, biopsy were negative for malignant cells.

## 2023-02-28 ENCOUNTER — Encounter: Payer: Self-pay | Admitting: Hematology

## 2023-02-28 ENCOUNTER — Inpatient Hospital Stay (HOSPITAL_BASED_OUTPATIENT_CLINIC_OR_DEPARTMENT_OTHER): Payer: Medicare Other | Admitting: Hematology

## 2023-02-28 ENCOUNTER — Inpatient Hospital Stay: Payer: Medicare Other | Attending: Nurse Practitioner

## 2023-02-28 VITALS — BP 132/65 | HR 60 | Temp 97.7°F | Resp 16 | Wt 209.4 lb

## 2023-02-28 DIAGNOSIS — D500 Iron deficiency anemia secondary to blood loss (chronic): Secondary | ICD-10-CM | POA: Diagnosis not present

## 2023-02-28 DIAGNOSIS — Z85038 Personal history of other malignant neoplasm of large intestine: Secondary | ICD-10-CM | POA: Diagnosis not present

## 2023-02-28 DIAGNOSIS — C182 Malignant neoplasm of ascending colon: Secondary | ICD-10-CM | POA: Diagnosis not present

## 2023-02-28 DIAGNOSIS — Z452 Encounter for adjustment and management of vascular access device: Secondary | ICD-10-CM | POA: Diagnosis not present

## 2023-02-28 DIAGNOSIS — R319 Hematuria, unspecified: Secondary | ICD-10-CM | POA: Diagnosis not present

## 2023-02-28 DIAGNOSIS — E860 Dehydration: Secondary | ICD-10-CM

## 2023-02-28 LAB — IRON AND IRON BINDING CAPACITY (CC-WL,HP ONLY)
Iron: 90 ug/dL (ref 45–182)
Saturation Ratios: 29 % (ref 17.9–39.5)
TIBC: 308 ug/dL (ref 250–450)
UIBC: 218 ug/dL (ref 117–376)

## 2023-02-28 LAB — CBC WITH DIFFERENTIAL (CANCER CENTER ONLY)
Abs Immature Granulocytes: 0.01 10*3/uL (ref 0.00–0.07)
Basophils Absolute: 0 10*3/uL (ref 0.0–0.1)
Basophils Relative: 0 %
Eosinophils Absolute: 0.1 10*3/uL (ref 0.0–0.5)
Eosinophils Relative: 2 %
HCT: 31.2 % — ABNORMAL LOW (ref 39.0–52.0)
Hemoglobin: 10.6 g/dL — ABNORMAL LOW (ref 13.0–17.0)
Immature Granulocytes: 0 %
Lymphocytes Relative: 14 %
Lymphs Abs: 0.4 10*3/uL — ABNORMAL LOW (ref 0.7–4.0)
MCH: 32.2 pg (ref 26.0–34.0)
MCHC: 34 g/dL (ref 30.0–36.0)
MCV: 94.8 fL (ref 80.0–100.0)
Monocytes Absolute: 0.3 10*3/uL (ref 0.1–1.0)
Monocytes Relative: 9 %
Neutro Abs: 2.4 10*3/uL (ref 1.7–7.7)
Neutrophils Relative %: 75 %
Platelet Count: 129 10*3/uL — ABNORMAL LOW (ref 150–400)
RBC: 3.29 MIL/uL — ABNORMAL LOW (ref 4.22–5.81)
RDW: 13 % (ref 11.5–15.5)
WBC Count: 3.2 10*3/uL — ABNORMAL LOW (ref 4.0–10.5)
nRBC: 0 % (ref 0.0–0.2)

## 2023-02-28 LAB — CMP (CANCER CENTER ONLY)
ALT: 25 U/L (ref 0–44)
AST: 22 U/L (ref 15–41)
Albumin: 3.8 g/dL (ref 3.5–5.0)
Alkaline Phosphatase: 117 U/L (ref 38–126)
Anion gap: 5 (ref 5–15)
BUN: 25 mg/dL — ABNORMAL HIGH (ref 8–23)
CO2: 25 mmol/L (ref 22–32)
Calcium: 9.3 mg/dL (ref 8.9–10.3)
Chloride: 110 mmol/L (ref 98–111)
Creatinine: 1.27 mg/dL — ABNORMAL HIGH (ref 0.61–1.24)
GFR, Estimated: 54 mL/min — ABNORMAL LOW (ref >60)
Glucose, Bld: 114 mg/dL — ABNORMAL HIGH (ref 70–99)
Potassium: 4.1 mmol/L (ref 3.5–5.1)
Sodium: 140 mmol/L (ref 135–145)
Total Bilirubin: 1 mg/dL (ref <1.2)
Total Protein: 6.6 g/dL (ref 6.5–8.1)

## 2023-02-28 LAB — URINALYSIS, COMPLETE (UACMP) WITH MICROSCOPIC
Bilirubin Urine: NEGATIVE
Glucose, UA: NEGATIVE mg/dL
Ketones, ur: NEGATIVE mg/dL
Leukocytes,Ua: NEGATIVE
Nitrite: NEGATIVE
Protein, ur: 30 mg/dL — AB
Specific Gravity, Urine: 1.017 (ref 1.005–1.030)
pH: 5 (ref 5.0–8.0)

## 2023-02-28 LAB — FERRITIN: Ferritin: 40 ng/mL (ref 24–336)

## 2023-02-28 LAB — CEA (ACCESS): CEA (CHCC): <1 ng/mL (ref 0.00–5.00)

## 2023-02-28 MED ORDER — HEPARIN SOD (PORK) LOCK FLUSH 100 UNIT/ML IV SOLN: 500.0000 [IU] | Freq: Once | INTRAVENOUS | Status: DC | PRN

## 2023-02-28 MED ORDER — SODIUM CHLORIDE 0.9% FLUSH: 10.0000 mL | Freq: Once | INTRAVENOUS | Status: DC | PRN

## 2023-02-28 NOTE — Progress Notes (Signed)
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 Cliffton Asters 09/14/21 -he completed 6 cycles adjuvant FOLFOX 10/22/21 - 01/13/22.  -he completed chemoRT with Xeloda on 03/26/2022 -continue, physical exam and lab test (including CBC, CMP and CEA) every 3 months for the first 2 years, then every 6-12 months, colonoscopy in one year, and surveilliance CT scan every 6-12 month for up to 5 year.  -surveillance CT from 05/24/22 showed NED, new small bilateral pleural effusion, right more than left.  He also has mild lower extremity edema.  Not sure about the etiology of his pleural effusion, will watch it for now, he is asymptomatic. -Continue colon cancer surveillance.  He is due for colonoscopy in June 2024. -Surveillance CT scan from November 23, 2022 showed no evidence of recurrence.  I discussed the finding of persistent enteritis and terminal ileum and colon.  His colonoscopy on October 14, 2022 did not show inflammation in terminal ileum and anastomosis, biopsy were negative for malignant cells.   Assessment and Plan    Colon Cancer Follow-up 89 year old with colon cancer diagnosed in May 2023. Currently asymptomatic with no new gastrointestinal complaints. Loose bowel movements post-surgery. Episode of dehydration and syncope in September due to dietary indiscretion. Hemoglobin 10.6, slightly low but improved from previous levels, likely related to prior chemotherapy. Mild anemia persists but does not require  further workup. Longer duration from initial diagnosis reduces recurrence risk. Next scan due in three months. - Schedule CT scan in early March 2025 - Call in mid-February 2025 if not contacted for CT scan scheduling - Continue prenatal vitamins - Flush port in January 2025 - Follow up in three months with lab work and port flush - Consider port removal if next scan is clear  Peripheral Neuropathy Mild numbness and tingling in fingers, likely residual from previous chemotherapy. Currently taking vitamin B12. - Continue monthly vitamin B12  General Health Maintenance Maintaining hydration with sports drinks and mixed drinks like Kool-Aid. No new medications or changes in current medications. No urinary tract discomfort. - Encourage continued hydration with sports drinks and mixed drinks  Follow-up - Schedule follow-up appointment in three months with lab and a CT chest, abdomen pelvis with contrast 1 week before -port flush every 6 weeks        SUMMARY OF ONCOLOGIC HISTORY: Oncology History Overview Note   Cancer Staging  Primary adenocarcinoma of ascending colon (HCC) Staging form: Colon and Rectum, AJCC 8th Edition - Pathologic stage from 09/14/2021: Stage IIIB (pT3, pN1c, cM0) - Signed by Pollyann Samples, NP on 10/08/2021 Stage prefix: Initial diagnosis Total positive nodes: 0 Histologic grading system: 4 grade system Histologic grade (G): G3 Laterality: Right Lymph-vascular invasion (LVI): LVI not present (absent)/not identified Tumor deposits (TD): Present Perineural invasion (PNI): Absent Microsatellite instability (MSI): Unstable high BRAF mutation: Positive     Ascending colon cancer s/p lap colectomy 09/14/2021  07/23/2021 Procedure   Upper GI Endoscopy/Colonoscopy; Dr. Leonides Schanz  Colonoscopy Impression: - Likely malignant partially obstructing tumor in the ascending colon. Biopsied. -  Six 3 to 8 mm polyps in the rectum and in the transverse colon, removed with a  cold snare. Resected and retrieved. - Non-bleeding internal hemorrhoids.  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 Biopsied. - Multiple gastric polyps. Resected and retrieved. - Erythematous mucosa in the antrum. Biopsied. - Mucosal nodule found in the duodenum. Biopsied. - Dilated lacteals were found in the duodenum. Biopsied. - Two non-bleeding angioectasias in the duodenum.   07/23/2021 Initial Biopsy   Diagnosis 1. Duodenum, Biopsy - BENIGN DUODENAL MUCOSA - NO ACUTE INFLAMMATION, VILLOUS BLUNTING OR INCREASED INTRAEPITHELIAL LYMPHOCYTES IDENTIFIED 2. Duodenum, Biopsy, Nodule - PEPTIC DUODENITIS - NO DYSPLASIA OR MALIGNANCY IDENTIFIED 3. Stomach, biopsy - MILD CHRONIC GASTRITIS WITHOUT ACTIVITY - NO INTESTINAL METAPLASIA IDENTIFIED - SEE COMMENT 4. Stomach, polyp(s) - HYPERPLASTIC GASTRIC POLYP(S) WITH INTESTINAL METAPLASIA - NO H. PYLORI OR MALIGNANCY IDENTIFIED 5. Esophagogastric junction, biopsy - SQUAMOCOLUMNAR JUNCTION WITH CHRONIC INFLAMMATION - NO INTESTINAL METAPLASIA, DYSPLASIA OR MALIGNANCY IDENTIFIED 6. Rectum, biopsy, and transverse - MULTIPLE FRAGMENTS OF HYPERPLASTIC POLYP(S) - NO HIGH-GRADE DYSPLASIA OR MALIGNANCY IDENTIFIED 7. Ascending Colon Biopsy, Mass - POORLY DIFFERENTIATED ADENOCARCINOMA - SEE COMMENT  Microscopic Comment 7. By immunohistochemistry, the neoplastic cells are positive for CDX2 (patchy, weak) but negative for p63, p40, CD56, chromogranin and synaptophysin. This immunoprofile is consistent with a poorly differentiated adenocarcinoma.  3. H. pylori immunohistochemistry is POSITIVE for RARE microorganisms.   07/30/2021 Imaging   EXAM: CT CHEST, ABDOMEN, AND PELVIS WITH CONTRAST  IMPRESSION: 1. Masslike region in the ascending colon extending to the level of the hepatic flexure in the right lower quadrant, concerning for malignancy. Associated pericolonic fat stranding and a  few prominent lymph nodes are seen adjacent to this segment of colon, suspicious for local infiltration. No distant metastasis is seen. 2. Left inguinal hernia containing nonobstructed colon. 3. Nonspecific small ground-glass opacity in the in the right lower lobe. Attention on follow-up is recommended. 4. Small hiatal hernia. 5. Aortic atherosclerosis and coronary artery calcifications   08/06/2021 Initial Diagnosis   Malignant neoplasm of ascending colon [facility])   Primary adenocarcinoma of ascending colon (HCC)  07/23/2021 Procedure   Colonoscopy by Dr. Leonides Schanz - A frond-like/villous, fungating and ulcerated partially obstructing large mass was found in the ascending colon. There was a point at which the mass could not be bypassed, even with use of an ultraslim colonoscope. It is suspected that this mass encompasses the entire cecum and ascending colon. The mass was circumferential. The mass measured fifteen cm in length. Oozing was present.    07/23/2021 Initial Biopsy   3. H. pylori immunohistochemistry is POSITIVE for RARE microorganisms.  FINAL DIAGNOSIS Diagnosis 1. Duodenum, Biopsy - BENIGN DUODENAL MUCOSA - NO ACUTE INFLAMMATION, VILLOUS BLUNTING OR INCREASED INTRAEPITHELIAL LYMPHOCYTES IDENTIFIED 2. Duodenum, Biopsy, Nodule - PEPTIC DUODENITIS - NO DYSPLASIA OR MALIGNANCY IDENTIFIED 3. Stomach, biopsy - MILD CHRONIC GASTRITIS WITHOUT ACTIVITY - NO INTESTINAL METAPLASIA IDENTIFIED - SEE COMMENT 4. Stomach, polyp(s) - HYPERPLASTIC GASTRIC POLYP(S) WITH INTESTINAL METAPLASIA - NO H. PYLORI OR MALIGNANCY IDENTIFIED 5. Esophagogastric junction, biopsy - SQUAMOCOLUMNAR JUNCTION WITH CHRONIC INFLAMMATION - NO INTESTINAL METAPLASIA, DYSPLASIA OR MALIGNANCY IDENTIFIED 6. Rectum, biopsy, and transverse - MULTIPLE FRAGMENTS OF HYPERPLASTIC POLYP(S) - NO HIGH-GRADE DYSPLASIA OR MALIGNANCY IDENTIFIED 7. Ascending Colon Biopsy, Mass - POORLY DIFFERENTIATED ADENOCARCINOMA - SEE  COMMENT   07/30/2021 Imaging   CT CAP IMPRESSION: 1. Masslike region in the ascending colon extending to the level of the hepatic flexure in the right  lower quadrant, concerning for malignancy. Associated pericolonic fat stranding and a few prominent lymph nodes are seen adjacent to this segment of colon, suspicious for local infiltration. No distant metastasis is seen. 2. Left inguinal hernia containing nonobstructed colon. 3. Nonspecific small ground-glass opacity in the in the right lower lobe. Attention on follow-up is recommended. 4. Small hiatal hernia. 5. Aortic atherosclerosis and coronary artery calcifications   09/14/2021 Surgery   PROCEDURE:  Laparoscopic converted to open right hemicolectomy Repair of small bowel serosa x 2     09/14/2021 Pathology Results   A. COLON, RIGHT, HEMI COLECTOMY:  Poorly differentiated adenocarcinoma in the ascending colon with clear margins of resection.  Tumor Size: Circumferential measuring 9.0 cm X 8.0 cm. X 2.5 cm.  Macroscopic Tumor Perforation: Not identified  Histologic Type: Adenocarcinoma.  Histologic Grade: Grade 3, poorly differentiated.  Multiple Primary Sites: Not applicable  Tumor Extension: Extends through the muscular wall into the pericolonic adipose tissue.  Lymphovascular Invasion: Not identified.  Perineural Invasion: Not identified.  Treatment Effect: No known presurgical therapy.  Margins:       Margin Status for Invasive Carcinoma: All margins are negative for invasive carcinoma       Distance from Invasive Carcinoma to proximal margin: 11 cm.       Distance from invasive carcinoma to distal margin: 12 cm.  Regional Lymph Nodes:       Number of Lymph Nodes with Tumor: None.       Number of Lymph Nodes Examined: Twenty-two (22)  Tumor Deposits: Present (slide A11)  Distant Metastasis:       Distant Site(s) Involved: None known.  Pathologic Stage Classification (pTNM, AJCC 8th Edition): pT3 pN1c  Comments: Omentum is  free of neoplastic invasion.                      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LAMN)    09/14/2021 Cancer Staging   Staging form: Colon and Rectum, AJCC 8th Edition - Pathologic stage from 09/14/2021: Stage IIIB (pT3, pN1c, cM0) - Signed by Pollyann Samples, NP on 10/08/2021 Stage prefix: Initial diagnosis Total positive nodes: 0 Histologic grading system: 4 grade system Histologic grade (G): G3 Laterality: Right Lymph-vascular invasion (LVI): LVI not present (absent)/not identified Tumor deposits (TD): Present Perineural invasion (PNI): Absent Microsatellite instability (MSI): Unstable high BRAF mutation: Positive   10/08/2021 Initial Diagnosis   Primary adenocarcinoma of ascending colon (HCC)   10/22/2021 - 11/07/2021 Chemotherapy   Patient is on Treatment Plan : COLORECTAL FOLFOX q14d x 3 months     10/22/2021 -  Chemotherapy   Patient is on Treatment Plan : COLORECTAL FOLFOX q14d x 3 months     11/23/2022 Imaging   CT CHEST ABDOMEN PELVIS W CONTRAST   IMPRESSION: 1. Status post right hemicolectomy and reanastomosis. No evidence of recurrent or metastatic disease in the chest, abdomen, or pelvis. 2. Diffuse, long segment wall thickening and mucosal hyperenhancement involving the remnant terminal ileum and colon, particularly of the remnant ascending and transverse colon. Inflammatory findings are increased compared to prior examination, particularly those involving the colon. Although findings consistent with nonspecific infectious, inflammatory, or ischemic enterocolitis, appearance and chronicity is in general highly suggestive of Crohn's ileocolitis. 3. Small right pleural effusion, diminished in volume, with resolution of a previously seen small left pleural effusion. 4. Coronary artery disease.      Discussed the use of AI scribe software for clinical note transcription with the patient, who gave verbal consent  to proceed.  History of Present Illness   The  patient, an 89 year old gentleman with a history of colon cancer diagnosed in May 2023, presents for a routine follow-up. He reports an incident in September where he overate, leading to dehydration and a brief hospitalization. Since then, he has been mindful of his hydration and diet, avoiding greasy foods. He reports loose bowel movements, which he understands is common post-colon surgery, and goes to the bathroom three to four times a day on average. He maintains hydration by drinking sports drinks, coffee, and mixed drinks like Kool-Aid throughout the day.  The patient has stopped taking omeprazole and Compazine, but continues to take prenatal vitamins and B12. He plans to discontinue the prenatal vitamins once his current supply runs out. He occasionally takes Imodium, particularly before attending church. He also reports a slight numbness and tingling in his fingers, a residual effect from previous chemotherapy, which he manages with vitamin B12.         All other systems were reviewed with the patient and are negative.  MEDICAL HISTORY:  Past Medical History:  Diagnosis Date   Acute cholecystitis 02/19/2018   Anemia    Chronic kidney disease    Colon cancer (HCC)    Diabetes mellitus without complication (HCC)    diet controlled   GERD (gastroesophageal reflux disease)    History of blood transfusion 08/2021   History of hiatal hernia    Hypertension    Hypothyroidism    Iron deficiency anemia    Peripheral neuropathy    bilateral feet    SURGICAL HISTORY: Past Surgical History:  Procedure Laterality Date   CATARACT EXTRACTION Bilateral    CHOLECYSTECTOMY N/A 02/20/2018   Procedure: LAPAROSCOPIC CHOLECYSTECTOMY;  Surgeon: Glenna Fellows, MD;  Location: [facility];  Service: General;  Laterality: N/A;   COLONOSCOPY  2023   LAPAROSCOPIC RIGHT HEMI COLECTOMY Right 09/14/2021   Procedure: LAPAROSCOPIC TO OPEN RIGHT HEMI COLECTOMY;  Surgeon: Andria Meuse, MD;  Location:  [facility];  Service: General;  Laterality: Right;   PORTACATH PLACEMENT N/A 10/15/2021   Procedure: INSERTION PORT-A-CATH;  Surgeon: Andria Meuse, MD;  Location: [facility];  Service: General;  Laterality: N/A;  with ultrasound and flouroscopic guidance   UPPER GASTROINTESTINAL ENDOSCOPY      I have reviewed the social history and family history with the patient and they are unchanged from previous note.  ALLERGIES:  has No Known Allergies.  MEDICATIONS:  Current Outpatient Medications  Medication Sig Dispense Refill   ARTIFICIAL TEAR SOLUTION OP Place 1 drop into both eyes daily as needed (dry eyes).     cyanocobalamin (,VITAMIN B-12,) 1000 MCG/ML injection Inject 1,000 mcg into the muscle every 30 (thirty) days.   0   levothyroxine (SYNTHROID) 75 MCG tablet Take 75 mcg by mouth daily before breakfast.     lisinopril (PRINIVIL,ZESTRIL) 20 MG tablet Take 20 mg by mouth daily.  3   loperamide (IMODIUM) 2 MG capsule Take 2 mg by mouth daily as needed for diarrhea or loose stools.     Prenatal Vit-Fe Fumarate-FA (PRENATAL MULTIVITAMIN) TABS tablet Take 1 tablet by mouth daily at 12 noon.     No current facility-administered medications for this visit.    PHYSICAL EXAMINATION: ECOG PERFORMANCE STATUS: 0 - Asymptomatic  Vitals:   02/28/23 0859  BP: 132/65  Pulse: 60  Resp: 16  Temp: 97.7 F (36.5 C)  SpO2: 98%   Wt Readings from Last 3 Encounters:  02/28/23 209 lb  6.4 oz (95 kg)  11/29/22 208 lb 9.6 oz (94.6 kg)  10/14/22 202 lb (91.6 kg)     GENERAL:alert, no distress and comfortable SKIN: skin color, texture, turgor are normal, no rashes or significant lesions EYES: normal, Conjunctiva are pink and non-injected, sclera clear NECK: supple, thyroid normal size, non-tender, without nodularity LYMPH:  no palpable lymphadenopathy in the cervical, axillary  LUNGS: clear to auscultation and percussion with normal breathing effort HEART: regular rate & rhythm and no murmurs and  no lower extremity edema ABDOMEN:abdomen soft, non-tender and normal bowel sounds Musculoskeletal:no cyanosis of digits and no clubbing  NEURO: alert & oriented x 3 with fluent speech, no focal motor/sensory deficits        LABORATORY DATA:  I have reviewed the data as listed    Latest Ref Rng & Units 02/28/2023    8:37 AM 01/10/2023    9:45 AM 12/06/2022   12:27 AM  CBC  WBC 4.0 - 10.5 K/uL 3.2  3.2  9.2   Hemoglobin 13.0 - 17.0 g/dL 84.6  96.2  95.2   Hematocrit 39.0 - 52.0 % 31.2  29.6  39.6   Platelets 150 - 400 K/uL 129  128  149         Latest Ref Rng & Units 01/10/2023    9:45 AM 12/06/2022   12:27 AM 11/23/2022    9:26 AM  CMP  Glucose 70 - 99 mg/dL 841  324  401   BUN 8 - 23 mg/dL 23  32  26   Creatinine 0.61 - 1.24 mg/dL 0.27  2.53  6.64   Sodium 135 - 145 mmol/L 141  137  140   Potassium 3.5 - 5.1 mmol/L 4.2  4.5  4.1   Chloride 98 - 111 mmol/L 110  105  108   CO2 22 - 32 mmol/L 26  20  27    Calcium 8.9 - 10.3 mg/dL 9.3  9.6  9.2   Total Protein 6.5 - 8.1 g/dL 6.2  7.9  6.3   Total Bilirubin 0.3 - 1.2 mg/dL 0.9  1.5  1.1   Alkaline Phos 38 - 126 U/L 90  84  92   AST 15 - 41 U/L 26  26  23    ALT 0 - 44 U/L 24  19  22        RADIOGRAPHIC STUDIES: I have personally reviewed the radiological images as listed and agreed with the findings in the report. No results found.    Orders Placed This Encounter  Procedures   CT CHEST ABDOMEN PELVIS W CONTRAST    Standing Status:   Future    Standing Expiration Date:   02/28/2024    Order Specific Question:   If indicated for the ordered procedure, I authorize the administration of contrast media per Radiology protocol    Answer:   Yes    Order Specific Question:   Does the patient have a contrast media/X-ray dye allergy?    Answer:   No    Order Specific Question:   Preferred imaging location?    Answer:   [facility]    Order Specific Question:   If indicated for the ordered procedure, I authorize the  administration of oral contrast media per Radiology protocol    Answer:   Yes   All questions were answered. The patient knows to call the clinic with any problems, questions or concerns. No barriers to learning was detected. The total time spent in the  appointment was 25 minutes.     Malachy Mood, MD 02/28/2023

## 2023-03-01 ENCOUNTER — Other Ambulatory Visit: Payer: Self-pay

## 2023-03-01 LAB — URINE CULTURE: Culture: NO GROWTH

## 2023-04-08 DIAGNOSIS — E538 Deficiency of other specified B group vitamins: Secondary | ICD-10-CM | POA: Diagnosis not present

## 2023-04-08 DIAGNOSIS — N183 Chronic kidney disease, stage 3 unspecified: Secondary | ICD-10-CM | POA: Diagnosis not present

## 2023-04-08 DIAGNOSIS — E039 Hypothyroidism, unspecified: Secondary | ICD-10-CM | POA: Diagnosis not present

## 2023-04-08 DIAGNOSIS — E119 Type 2 diabetes mellitus without complications: Secondary | ICD-10-CM | POA: Diagnosis not present

## 2023-04-08 DIAGNOSIS — Z85038 Personal history of other malignant neoplasm of large intestine: Secondary | ICD-10-CM | POA: Diagnosis not present

## 2023-04-08 DIAGNOSIS — Z0000 Encounter for general adult medical examination without abnormal findings: Secondary | ICD-10-CM | POA: Diagnosis not present

## 2023-04-08 DIAGNOSIS — D500 Iron deficiency anemia secondary to blood loss (chronic): Secondary | ICD-10-CM | POA: Diagnosis not present

## 2023-04-08 DIAGNOSIS — I10 Essential (primary) hypertension: Secondary | ICD-10-CM | POA: Diagnosis not present

## 2023-04-12 ENCOUNTER — Inpatient Hospital Stay: Payer: Medicare Other | Attending: Nurse Practitioner

## 2023-04-12 VITALS — BP 132/61 | HR 62 | Temp 97.8°F | Resp 18

## 2023-04-12 DIAGNOSIS — Z85038 Personal history of other malignant neoplasm of large intestine: Secondary | ICD-10-CM | POA: Diagnosis not present

## 2023-04-12 DIAGNOSIS — D500 Iron deficiency anemia secondary to blood loss (chronic): Secondary | ICD-10-CM

## 2023-04-12 DIAGNOSIS — E860 Dehydration: Secondary | ICD-10-CM

## 2023-04-12 DIAGNOSIS — Z452 Encounter for adjustment and management of vascular access device: Secondary | ICD-10-CM | POA: Diagnosis not present

## 2023-04-12 MED ORDER — HEPARIN SOD (PORK) LOCK FLUSH 100 UNIT/ML IV SOLN
500.0000 [IU] | Freq: Once | INTRAVENOUS | Status: AC
  Administered 2023-04-12: 500 [IU]

## 2023-04-12 MED ORDER — SODIUM CHLORIDE 0.9% FLUSH
10.0000 mL | Freq: Once | INTRAVENOUS | Status: AC
  Administered 2023-04-12: 10 mL

## 2023-04-27 DIAGNOSIS — H40023 Open angle with borderline findings, high risk, bilateral: Secondary | ICD-10-CM | POA: Diagnosis not present

## 2023-04-27 DIAGNOSIS — H04123 Dry eye syndrome of bilateral lacrimal glands: Secondary | ICD-10-CM | POA: Diagnosis not present

## 2023-04-27 DIAGNOSIS — H26493 Other secondary cataract, bilateral: Secondary | ICD-10-CM | POA: Diagnosis not present

## 2023-04-27 DIAGNOSIS — E119 Type 2 diabetes mellitus without complications: Secondary | ICD-10-CM | POA: Diagnosis not present

## 2023-05-23 ENCOUNTER — Ambulatory Visit (HOSPITAL_COMMUNITY)
Admission: RE | Admit: 2023-05-23 | Discharge: 2023-05-23 | Disposition: A | Discharge status: Home / Self Care | Payer: Medicare Other | Source: Ambulatory Visit | Attending: Hematology | Admitting: Hematology

## 2023-05-23 ENCOUNTER — Inpatient Hospital Stay: Payer: Medicare Other | Attending: Nurse Practitioner

## 2023-05-23 DIAGNOSIS — D649 Anemia, unspecified: Secondary | ICD-10-CM | POA: Diagnosis not present

## 2023-05-23 DIAGNOSIS — Z85038 Personal history of other malignant neoplasm of large intestine: Secondary | ICD-10-CM | POA: Diagnosis present

## 2023-05-23 DIAGNOSIS — C189 Malignant neoplasm of colon, unspecified: Secondary | ICD-10-CM | POA: Diagnosis not present

## 2023-05-23 DIAGNOSIS — Z9049 Acquired absence of other specified parts of digestive tract: Secondary | ICD-10-CM | POA: Insufficient documentation

## 2023-05-23 DIAGNOSIS — C182 Malignant neoplasm of ascending colon: Secondary | ICD-10-CM | POA: Insufficient documentation

## 2023-05-23 DIAGNOSIS — Z452 Encounter for adjustment and management of vascular access device: Secondary | ICD-10-CM | POA: Insufficient documentation

## 2023-05-23 DIAGNOSIS — D500 Iron deficiency anemia secondary to blood loss (chronic): Secondary | ICD-10-CM

## 2023-05-23 DIAGNOSIS — I700 Atherosclerosis of aorta: Secondary | ICD-10-CM | POA: Diagnosis not present

## 2023-05-23 DIAGNOSIS — E860 Dehydration: Secondary | ICD-10-CM

## 2023-05-23 DIAGNOSIS — Z9221 Personal history of antineoplastic chemotherapy: Secondary | ICD-10-CM | POA: Insufficient documentation

## 2023-05-23 LAB — CBC WITH DIFFERENTIAL (CANCER CENTER ONLY)
Abs Immature Granulocytes: 0.02 10*3/uL (ref 0.00–0.07)
Basophils Absolute: 0 10*3/uL (ref 0.0–0.1)
Basophils Relative: 0 %
Eosinophils Absolute: 0.1 10*3/uL (ref 0.0–0.5)
Eosinophils Relative: 2 %
HCT: 32.6 % — ABNORMAL LOW (ref 39.0–52.0)
Hemoglobin: 11.2 g/dL — ABNORMAL LOW (ref 13.0–17.0)
Immature Granulocytes: 1 %
Lymphocytes Relative: 12 %
Lymphs Abs: 0.5 10*3/uL — ABNORMAL LOW (ref 0.7–4.0)
MCH: 32.1 pg (ref 26.0–34.0)
MCHC: 34.4 g/dL (ref 30.0–36.0)
MCV: 93.4 fL (ref 80.0–100.0)
Monocytes Absolute: 0.3 10*3/uL (ref 0.1–1.0)
Monocytes Relative: 8 %
Neutro Abs: 3 10*3/uL (ref 1.7–7.7)
Neutrophils Relative %: 77 %
Platelet Count: 153 10*3/uL (ref 150–400)
RBC: 3.49 MIL/uL — ABNORMAL LOW (ref 4.22–5.81)
RDW: 12.7 % (ref 11.5–15.5)
WBC Count: 3.8 10*3/uL — ABNORMAL LOW (ref 4.0–10.5)
nRBC: 0 % (ref 0.0–0.2)

## 2023-05-23 LAB — CEA (ACCESS): CEA (CHCC): <1 ng/mL (ref 0.00–5.00)

## 2023-05-23 LAB — CMP (CANCER CENTER ONLY)
ALT: 15 U/L (ref 0–44)
AST: 18 U/L (ref 15–41)
Albumin: 4.1 g/dL (ref 3.5–5.0)
Alkaline Phosphatase: 110 U/L (ref 38–126)
Anion gap: 3 — ABNORMAL LOW (ref 5–15)
BUN: 25 mg/dL — ABNORMAL HIGH (ref 8–23)
CO2: 24 mmol/L (ref 22–32)
Calcium: 9.5 mg/dL (ref 8.9–10.3)
Chloride: 109 mmol/L (ref 98–111)
Creatinine: 1.38 mg/dL — ABNORMAL HIGH (ref 0.61–1.24)
GFR, Estimated: 49 mL/min — ABNORMAL LOW (ref >60)
Glucose, Bld: 116 mg/dL — ABNORMAL HIGH (ref 70–99)
Potassium: 5 mmol/L (ref 3.5–5.1)
Sodium: 136 mmol/L (ref 135–145)
Total Bilirubin: 0.7 mg/dL (ref 0.0–1.2)
Total Protein: 7.1 g/dL (ref 6.5–8.1)

## 2023-05-23 LAB — IRON AND IRON BINDING CAPACITY (CC-WL,HP ONLY)
Iron: 86 ug/dL (ref 45–182)
Saturation Ratios: 26 % (ref 17.9–39.5)
TIBC: 332 ug/dL (ref 250–450)
UIBC: 246 ug/dL (ref 117–376)

## 2023-05-23 LAB — FERRITIN: Ferritin: 60 ng/mL (ref 24–336)

## 2023-05-23 MED ORDER — HEPARIN SOD (PORK) LOCK FLUSH 100 UNIT/ML IV SOLN
500.0000 [IU] | Freq: Once | INTRAVENOUS | Status: AC
Start: 2023-05-23 — End: 2023-05-23
  Administered 2023-05-23: 500 [IU] via INTRAVENOUS

## 2023-05-23 MED ORDER — IOHEXOL 300 MG/ML  SOLN
30.0000 mL | Freq: Once | INTRAMUSCULAR | Status: AC | PRN
  Administered 2023-05-23: 30 mL via ORAL

## 2023-05-23 MED ORDER — SODIUM CHLORIDE 0.9% FLUSH
10.0000 mL | Freq: Once | INTRAVENOUS | Status: AC
  Administered 2023-05-23: 10 mL

## 2023-05-23 MED ORDER — IOHEXOL 300 MG/ML  SOLN
100.0000 mL | Freq: Once | INTRAMUSCULAR | Status: AC | PRN
  Administered 2023-05-23: 100 mL via INTRAVENOUS

## 2023-05-30 NOTE — Assessment & Plan Note (Signed)
 pT3pN1cM0 stage IIIB, loss of MLH1 and PMS2, BRAF V600E+, MLH1 hypermethylation  -diagnosed 07/2021  -s/p open right colectomy by Dr. Cliffton Asters 09/14/21 -he completed 6 cycles adjuvant FOLFOX 10/22/21 - 01/13/22.  -he completed chemoRT with Xeloda on 03/26/2022 -continue, physical exam and lab test (including CBC, CMP and CEA) every 3 months for the first 2 years, then every 6-12 months, colonoscopy in one year, and surveilliance CT scan every 6-12 month for up to 5 year.  -surveillance CT from 05/24/22 showed NED, new small bilateral pleural effusion, right more than left.  He also has mild lower extremity edema.  Not sure about the etiology of his pleural effusion, will watch it for now, he is asymptomatic. -Continue colon cancer surveillance.  He is due for colonoscopy in June 2024. -Surveillance CT scan from 05/23/2023 showed no evidence of recurrence.  He is clinically doing well and no concerns.

## 2023-05-31 ENCOUNTER — Inpatient Hospital Stay (HOSPITAL_BASED_OUTPATIENT_CLINIC_OR_DEPARTMENT_OTHER): Payer: Medicare Other | Admitting: Hematology

## 2023-05-31 VITALS — BP 146/70 | HR 93 | Temp 98.1°F | Resp 19 | Wt 213.8 lb

## 2023-05-31 DIAGNOSIS — Z9221 Personal history of antineoplastic chemotherapy: Secondary | ICD-10-CM | POA: Diagnosis not present

## 2023-05-31 DIAGNOSIS — C182 Malignant neoplasm of ascending colon: Secondary | ICD-10-CM | POA: Diagnosis not present

## 2023-05-31 DIAGNOSIS — Z452 Encounter for adjustment and management of vascular access device: Secondary | ICD-10-CM | POA: Diagnosis not present

## 2023-05-31 DIAGNOSIS — D649 Anemia, unspecified: Secondary | ICD-10-CM | POA: Diagnosis not present

## 2023-05-31 DIAGNOSIS — Z9049 Acquired absence of other specified parts of digestive tract: Secondary | ICD-10-CM | POA: Diagnosis not present

## 2023-05-31 NOTE — Progress Notes (Signed)
 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 Cliffton Asters 09/14/21 -he completed 6 cycles adjuvant FOLFOX 10/22/21 - 01/13/22.  -he completed chemoRT with Xeloda on 03/26/2022 -continue, physical exam and lab test (including CBC, CMP and CEA) every 3 months for the first 2 years, then every 6-12 months, colonoscopy in one year, and surveilliance CT scan every 6-12 month for up to 5 year.  -surveillance CT from 05/24/22 showed NED, new small bilateral pleural effusion, right more than left.  He also has mild lower extremity edema.  Not sure about the etiology of his pleural effusion, will watch it for now, he is asymptomatic. -Continue colon cancer surveillance.  He is due for colonoscopy in June 2024. -Surveillance CT scan from 05/23/2023 showed no evidence of recurrence.  He is clinically doing well and no concerns.     Assessment and Plan    Colon cancer Post-surgery and chemotherapy follow-up for colon cancer. Bowel movements are soft, likely due to dietary factors and previous treatment. No signs of recurrence on physical exam. Previous colonoscopy in July 2024 was clear. Blood tests show mild anemia and leukopenia, likely residual effects of chemotherapy. Tumor markers are normal. Slight thickening of the small intestine and stomach, likely due to previous surgery and chemotherapy. Risk of recurrence decreases significantly after three years post-diagnosis, with the first two years being the highest  risk and the third year having moderate risk. - Follow-up appointments every four months for this year and next year, then every six months. - Schedule a scan in a year. - Advise to report new symptoms such as bloating, irregular bowel movements, bleeding, hematochezia, nausea, or significant weight loss. - Encourage hydration to address slightly elevated creatinine levels. - Discuss potential benefits of vitamin D supplementation for colon cancer risk reduction.  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 Symptoms have improved but are not fully resolved. He reports coping with the condition and engaging in exercises to improve balance. - Encourage continued exercise to strengthen core muscles and improve balance. - Advise on fall prevention strategies.  [facility] no longer necessary as he is stable and being monitored every four months. Port maintenance requires regular flushing, which is no longer needed. He is agreeable to removal. - Contact Dr. Cliffton Asters to arrange for port removal within the next two months. - Discontinue scheduling of port flushes.     Plan -Lab and CT scan reviewed with patient, NED.  He is clinically doing well. -Continue cancer surveillance, lab and follow-up in 4 months -I messaged his surgeon Dr. Cliffton Asters to schedule his port removal    SUMMARY OF ONCOLOGIC HISTORY: Oncology History Overview Note   Cancer Staging  Primary adenocarcinoma of ascending colon [facility]) Staging form: Colon and Rectum, AJCC 8th Edition - Pathologic stage from 09/14/2021: Stage IIIB (pT3, pN1c, cM0) - Signed by Pollyann Samples, NP on 10/08/2021 Stage prefix: Initial diagnosis Total positive nodes: 0 Histologic grading system: 4 grade system Histologic grade (G): G3 Laterality: Right  Lymph-vascular invasion (LVI): LVI not present (absent)/not identified Tumor deposits (TD): Present Perineural invasion (PNI):  Absent Microsatellite instability (MSI): Unstable high BRAF mutation: Positive     Ascending colon cancer s/p lap colectomy 09/14/2021  07/23/2021 Procedure   Upper GI Endoscopy/Colonoscopy; Dr. Leonides Schanz  Colonoscopy Impression: - Likely malignant partially obstructing tumor in the ascending colon. Biopsied. - Six 3 to 8 mm polyps in the rectum and in the transverse colon, removed with a cold snare. Resected and retrieved. - Non-bleeding internal hemorrhoids.  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 Biopsied. - Multiple gastric polyps. Resected and retrieved. - Erythematous mucosa in the antrum. Biopsied. - Mucosal nodule found in the duodenum. Biopsied. - Dilated lacteals were found in the duodenum. Biopsied. - Two non-bleeding angioectasias in the duodenum.   07/23/2021 Initial Biopsy   Diagnosis 1. Duodenum, Biopsy - BENIGN DUODENAL MUCOSA - NO ACUTE INFLAMMATION, VILLOUS BLUNTING OR INCREASED INTRAEPITHELIAL LYMPHOCYTES IDENTIFIED 2. Duodenum, Biopsy, Nodule - PEPTIC DUODENITIS - NO DYSPLASIA OR MALIGNANCY IDENTIFIED 3. Stomach, biopsy - MILD CHRONIC GASTRITIS WITHOUT ACTIVITY - NO INTESTINAL METAPLASIA IDENTIFIED - SEE COMMENT 4. Stomach, polyp(s) - HYPERPLASTIC GASTRIC POLYP(S) WITH INTESTINAL METAPLASIA - NO H. PYLORI OR MALIGNANCY IDENTIFIED 5. Esophagogastric junction, biopsy - SQUAMOCOLUMNAR JUNCTION WITH CHRONIC INFLAMMATION - NO INTESTINAL METAPLASIA, DYSPLASIA OR MALIGNANCY IDENTIFIED 6. Rectum, biopsy, and transverse - MULTIPLE FRAGMENTS OF HYPERPLASTIC POLYP(S) - NO HIGH-GRADE DYSPLASIA OR MALIGNANCY IDENTIFIED 7. Ascending Colon Biopsy, Mass - POORLY DIFFERENTIATED ADENOCARCINOMA - SEE COMMENT  Microscopic Comment 7. By immunohistochemistry, the neoplastic cells are positive for CDX2 (patchy, weak) but negative for p63, p40, CD56, chromogranin and synaptophysin. This immunoprofile is consistent  with a poorly differentiated adenocarcinoma.  3. H. pylori immunohistochemistry is POSITIVE for RARE microorganisms.   07/30/2021 Imaging   EXAM: CT CHEST, ABDOMEN, AND PELVIS WITH CONTRAST  IMPRESSION: 1. Masslike region in the ascending colon extending to the level of the hepatic flexure in the right lower quadrant, concerning for malignancy. Associated pericolonic fat stranding and a few prominent lymph nodes are seen adjacent to this segment of colon, suspicious for local infiltration. No distant metastasis is seen. 2. Left inguinal hernia containing nonobstructed colon. 3. Nonspecific small ground-glass opacity in the in the right lower lobe. Attention on follow-up is recommended. 4. Small hiatal hernia. 5. Aortic atherosclerosis and coronary artery calcifications   08/06/2021 Initial Diagnosis   Malignant neoplasm of ascending colon [facility])   Primary adenocarcinoma of ascending colon (HCC)  07/23/2021 Procedure   Colonoscopy by Dr. Leonides Schanz - A frond-like/villous, fungating and ulcerated partially obstructing large mass was found in the ascending colon. There was a point at which the mass could not be bypassed, even with use of an ultraslim colonoscope. It is suspected that this mass encompasses the entire cecum and ascending colon. The mass was circumferential. The mass measured fifteen cm in length. Oozing was present.    07/23/2021 Initial Biopsy   3. H. pylori immunohistochemistry is POSITIVE for RARE microorganisms.  FINAL DIAGNOSIS Diagnosis 1. Duodenum, Biopsy - BENIGN DUODENAL MUCOSA - NO ACUTE INFLAMMATION, VILLOUS BLUNTING OR INCREASED INTRAEPITHELIAL LYMPHOCYTES IDENTIFIED 2. Duodenum, Biopsy, Nodule - PEPTIC DUODENITIS - NO DYSPLASIA OR MALIGNANCY IDENTIFIED 3. Stomach, biopsy - MILD CHRONIC GASTRITIS WITHOUT ACTIVITY - NO INTESTINAL METAPLASIA IDENTIFIED - SEE COMMENT 4. Stomach, polyp(s) - HYPERPLASTIC GASTRIC POLYP(S) WITH INTESTINAL METAPLASIA - NO H. PYLORI  OR MALIGNANCY IDENTIFIED 5. Esophagogastric junction, biopsy - SQUAMOCOLUMNAR JUNCTION WITH CHRONIC INFLAMMATION - NO INTESTINAL METAPLASIA, DYSPLASIA  OR MALIGNANCY IDENTIFIED 6. Rectum, biopsy, and transverse - MULTIPLE FRAGMENTS OF HYPERPLASTIC POLYP(S) - NO HIGH-GRADE DYSPLASIA OR MALIGNANCY IDENTIFIED 7. Ascending Colon Biopsy, Mass - POORLY DIFFERENTIATED ADENOCARCINOMA - SEE COMMENT   07/30/2021 Imaging   CT CAP IMPRESSION: 1. Masslike region in the ascending colon extending to the level of the hepatic flexure in the right lower quadrant, concerning for malignancy. Associated pericolonic fat stranding and a few prominent lymph nodes are seen adjacent to this segment of colon, suspicious for local infiltration. No distant metastasis is seen. 2. Left inguinal hernia containing nonobstructed colon. 3. Nonspecific small ground-glass opacity in the in the right lower lobe. Attention on follow-up is recommended. 4. Small hiatal hernia. 5. Aortic atherosclerosis and coronary artery calcifications   09/14/2021 Surgery   PROCEDURE:  Laparoscopic converted to open right hemicolectomy Repair of small bowel serosa x 2     09/14/2021 Pathology Results   A. COLON, RIGHT, HEMI COLECTOMY:  Poorly differentiated adenocarcinoma in the ascending colon with clear margins of resection.  Tumor Size: Circumferential measuring 9.0 cm X 8.0 cm. X 2.5 cm.  Macroscopic Tumor Perforation: Not identified  Histologic Type: Adenocarcinoma.  Histologic Grade: Grade 3, poorly differentiated.  Multiple Primary Sites: Not applicable  Tumor Extension: Extends through the muscular wall into the pericolonic adipose tissue.  Lymphovascular Invasion: Not identified.  Perineural Invasion: Not identified.  Treatment Effect: No known presurgical therapy.  Margins:       Margin Status for Invasive Carcinoma: All margins are negative for invasive carcinoma       Distance from Invasive Carcinoma to proximal margin: 11  cm.       Distance from invasive carcinoma to distal margin: 12 cm.  Regional Lymph Nodes:       Number of Lymph Nodes with Tumor: None.       Number of Lymph Nodes Examined: Twenty-two (22)  Tumor Deposits: Present (slide A11)  Distant Metastasis:       Distant Site(s) Involved: None known.  Pathologic Stage Classification (pTNM, AJCC 8th Edition): pT3 pN1c  Comments: Omentum is free of neoplastic invasion.                      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LAMN)    09/14/2021 Cancer Staging   Staging form: Colon and Rectum, AJCC 8th Edition - Pathologic stage from 09/14/2021: Stage IIIB (pT3, pN1c, cM0) - Signed by Pollyann Samples, NP on 10/08/2021 Stage prefix: Initial diagnosis Total positive nodes: 0 Histologic grading system: 4 grade system Histologic grade (G): G3 Laterality: Right Lymph-vascular invasion (LVI): LVI not present (absent)/not identified Tumor deposits (TD): Present Perineural invasion (PNI): Absent Microsatellite instability (MSI): Unstable high BRAF mutation: Positive   10/08/2021 Initial Diagnosis   Primary adenocarcinoma of ascending colon (HCC)   10/22/2021 - 11/07/2021 Chemotherapy   Patient is on Treatment Plan : COLORECTAL FOLFOX q14d x 3 months     10/22/2021 -  Chemotherapy   Patient is on Treatment Plan : COLORECTAL FOLFOX q14d x 3 months     11/23/2022 Imaging   CT CHEST ABDOMEN PELVIS W CONTRAST   IMPRESSION: 1. Status post right hemicolectomy and reanastomosis. No evidence of recurrent or metastatic disease in the chest, abdomen, or pelvis. 2. Diffuse, long segment wall thickening and mucosal hyperenhancement involving the remnant terminal ileum and colon, particularly of the remnant ascending and transverse colon. Inflammatory findings are increased compared to prior examination, particularly those involving the colon. Although findings consistent with nonspecific infectious,  inflammatory, or ischemic enterocolitis, appearance and  chronicity is in general highly suggestive of Crohn's ileocolitis. 3. Small right pleural effusion, diminished in volume, with resolution of a previously seen small left pleural effusion. 4. Coronary artery disease.      Discussed the use of AI scribe software for clinical note transcription with the patient, who gave verbal consent to proceed.  History of Present Illness   The patient, an 89 year old with a history of colon cancer, presents for a routine follow-up. He reports no new health issues since his last visit. He has regular bowel movements, approximately four times a day, which are soft and vary with diet and fluid intake. This pattern has been consistent since his surgery and chemotherapy. He denies any abdominal pain or other stomach issues. His appetite is good and his weight has remained stable. He denies any other health concerns from head to toe. He had a colonoscopy in July 2024 and is due for another in three years. He has a port in place from his chemotherapy treatment, which he is considering having removed.         All other systems were reviewed with the patient and are negative.  MEDICAL HISTORY:  Past Medical History:  Diagnosis Date   Acute cholecystitis 02/19/2018   Anemia    Chronic kidney disease    Colon cancer (HCC)    Diabetes mellitus without complication (HCC)    diet controlled   GERD (gastroesophageal reflux disease)    History of blood transfusion 08/2021   History of hiatal hernia    Hypertension    Hypothyroidism    Iron deficiency anemia    Peripheral neuropathy    bilateral feet    SURGICAL HISTORY: Past Surgical History:  Procedure Laterality Date   CATARACT EXTRACTION Bilateral    CHOLECYSTECTOMY N/A 02/20/2018   Procedure: LAPAROSCOPIC CHOLECYSTECTOMY;  Surgeon: Glenna Fellows, MD;  Location: [facility];  Service: General;  Laterality: N/A;   COLONOSCOPY  2023   LAPAROSCOPIC RIGHT HEMI COLECTOMY Right 09/14/2021   Procedure:  LAPAROSCOPIC TO OPEN RIGHT HEMI COLECTOMY;  Surgeon: Andria Meuse, MD;  Location: [facility];  Service: General;  Laterality: Right;   PORTACATH PLACEMENT N/A 10/15/2021   Procedure: INSERTION PORT-A-CATH;  Surgeon: Andria Meuse, MD;  Location: [facility];  Service: General;  Laterality: N/A;  with ultrasound and flouroscopic guidance   UPPER GASTROINTESTINAL ENDOSCOPY      I have reviewed the social history and family history with the patient and they are unchanged from previous note.  ALLERGIES:  has no known allergies.  MEDICATIONS:  Current Outpatient Medications  Medication Sig Dispense Refill   ARTIFICIAL TEAR SOLUTION OP Place 1 drop into both eyes daily as needed (dry eyes).     cyanocobalamin (,VITAMIN B-12,) 1000 MCG/ML injection Inject 1,000 mcg into the muscle every 30 (thirty) days.   0   levothyroxine (SYNTHROID) 75 MCG tablet Take 75 mcg by mouth daily before breakfast.     lisinopril (PRINIVIL,ZESTRIL) 20 MG tablet Take 20 mg by mouth daily.  3   loperamide (IMODIUM) 2 MG capsule Take 2 mg by mouth daily as needed for diarrhea or loose stools.     Prenatal Vit-Fe Fumarate-FA (PRENATAL MULTIVITAMIN) TABS tablet Take 1 tablet by mouth daily at 12 noon.     No current facility-administered medications for this visit.    PHYSICAL EXAMINATION: ECOG PERFORMANCE STATUS: 0 - Asymptomatic  Vitals:   05/31/23 0837  BP: (!) 146/70  Pulse: 93  Resp: 19  Temp: 98.1 F (36.7 C)  SpO2: 96%   Wt Readings from Last 3 Encounters:  05/31/23 213 lb 12.8 oz (97 kg)  02/28/23 209 lb 6.4 oz (95 kg)  11/29/22 208 lb 9.6 oz (94.6 kg)     GENERAL:alert, no distress and comfortable SKIN: skin color, texture, turgor are normal, no rashes or significant lesions EYES: normal, Conjunctiva are pink and non-injected, sclera clear NECK: supple, thyroid normal size, non-tender, without nodularity LYMPH:  no palpable lymphadenopathy in the cervical, axillary  LUNGS: clear to  auscultation and percussion with normal breathing effort HEART: regular rate & rhythm and no murmurs and no lower extremity edema ABDOMEN:abdomen soft, non-tender and normal bowel sounds Musculoskeletal:no cyanosis of digits and no clubbing  NEURO: alert & oriented x 3 with fluent speech, no focal motor/sensory deficits  Physical Exam   SKIN: Normal      LABORATORY DATA:  I have reviewed the data as listed    Latest Ref Rng & Units 05/23/2023    9:20 AM 02/28/2023    8:37 AM 01/10/2023    9:45 AM  CBC  WBC 4.0 - 10.5 K/uL 3.8  3.2  3.2   Hemoglobin 13.0 - 17.0 g/dL 16.1  09.6  04.5   Hematocrit 39.0 - 52.0 % 32.6  31.2  29.6   Platelets 150 - 400 K/uL 153  129  128         Latest Ref Rng & Units 05/23/2023    9:20 AM 02/28/2023    8:37 AM 01/10/2023    9:45 AM  CMP  Glucose 70 - 99 mg/dL 409  811  914   BUN 8 - 23 mg/dL 25  25  23    Creatinine 0.61 - 1.24 mg/dL 7.82  9.56  2.13   Sodium 135 - 145 mmol/L 136  140  141   Potassium 3.5 - 5.1 mmol/L 5.0  4.1  4.2   Chloride 98 - 111 mmol/L 109  110  110   CO2 22 - 32 mmol/L 24  25  26    Calcium 8.9 - 10.3 mg/dL 9.5  9.3  9.3   Total Protein 6.5 - 8.1 g/dL 7.1  6.6  6.2   Total Bilirubin 0.0 - 1.2 mg/dL 0.7  1.0  0.9   Alkaline Phos 38 - 126 U/L 110  117  90   AST 15 - 41 U/L 18  22  26    ALT 0 - 44 U/L 15  25  24        RADIOGRAPHIC STUDIES: I have personally reviewed the radiological images as listed and agreed with the findings in the report. No results found.    No orders of the defined types were placed in this encounter.  All questions were answered. The patient knows to call the clinic with any problems, questions or concerns. No barriers to learning was detected. The total time spent in the appointment was 25 minutes.     Malachy Mood, MD 05/31/2023

## 2023-06-01 ENCOUNTER — Other Ambulatory Visit: Payer: Self-pay

## 2023-06-02 ENCOUNTER — Ambulatory Visit: Payer: Self-pay | Admitting: Surgery

## 2023-06-02 NOTE — Progress Notes (Signed)
 Surgery orders requested via Epic inbox.

## 2023-06-04 NOTE — Progress Notes (Signed)
 COVID Vaccine received:  []  No [x]  Yes Date of any COVID positive Test in last 25 days:None  PCP - Wyvonnia Dusky, MD at [facility] [phone] (Work) [phone] (Fax)  Cardiologist -  none Oncology- Malachy Mood, MD   Chest x-ray - 10-16-2021  1v  Epic EKG -  12-06-2022  Epic Stress Test -  ECHO -  Cardiac Cath -   PCR screen: []  Ordered & Completed []   No Order but Needs PROFEND     [x]   N/A for this surgery  Surgery Plan:  [x]  Ambulatory   []  Outpatient in bed  []  Admit Anesthesia:    []  General  []  Spinal  []   Choice [x]   MAC  Pacemaker / ICD device [x]  No []  Yes   Spinal Cord Stimulator:[x]  No []  Yes       History of Sleep Apnea? [x]  No []  Yes   CPAP used?- [x]  No []  Yes    Does the patient monitor blood sugar?   []  N/A   [x]  No []  Yes  Patient has: []  NO Hx DM   [x]  Pre-DM   []  DM1  []   DM2 Last A1c was:  5.8 on  05-04-22   per [facility] note  Blood Thinner / Instructions: none Aspirin Instructions:  none  ERAS Protocol Ordered: []  No  [x]  Yes PRE-SURGERY []  ENSURE  [x]  G2   Patient is to be NPO after: 1300   ERAS on special needs but NPO in Orders by Dr. Cliffton Asters, arrival [phone]. Confirmed ERAS for patient per Nicole/ Toniann Fail at Dr. Lucilla Lame office  Dental hx: []  Dentures:  [x]  N/A      []  Bridge or Partial:                   []  Loose or Damaged teeth:   Activity level: Patient is able to climb a flight of stairs without difficulty; [x]  No CP  [x]  No SOB. Patient can  not perform ADLs without assistance.   Anesthesia review: Pre-DM, HTN, CKD3, vertigo, GERD, s/p right colectomy for cancer.   Patient denies shortness of breath, fever, cough and chest pain at PAT appointment.  Patient verbalized understanding and agreement to the Pre-Surgical Instructions that were given to them at this PAT appointment. Patient was also educated of the need to review these PAT instructions again prior to his surgery.I reviewed the appropriate phone numbers to call if they have any and questions or  concerns.

## 2023-06-04 NOTE — Patient Instructions (Signed)
 SURGICAL WAITING ROOM VISITATION Patients having surgery or a procedure may have no more than 2 support people in the waiting area - these visitors may rotate in the visitor waiting room.   Due to an increase in RSV and influenza rates and associated hospitalizations, children ages 47 and under may not visit patients in [facility] hospitals. If the patient needs to stay at the hospital during part of their recovery, the visitor guidelines for inpatient rooms apply.  PRE-OP VISITATION  Pre-op nurse will coordinate an appropriate time for 1 support person to accompany the patient in pre-op.  This support person may not rotate.  This visitor will be contacted when the time is appropriate for the visitor to come back in the pre-op area.  Please refer to the [facility] website for the visitor guidelines for Inpatients (after your surgery is over and you are in a regular room).  You are not required to quarantine at this time prior to your surgery. However, you must do this: Hand Hygiene often Do NOT share personal items Notify your provider if you are in close contact with someone who has COVID or you develop fever 100.4 or greater, new onset of sneezing, cough, sore throat, shortness of breath or body aches.  If you test positive for Covid or have been in contact with anyone that has tested positive in the last 10 days please notify you surgeon.    Your procedure is scheduled on:  THURSDAY  June 09, 2023  Report to [facility] Main Entrance: [address] entrance where the [employer] is available.   Report to admitting at: 1:45 PM  Call this number if you have any questions or problems the morning of surgery [phone]  FOLLOW ANY ADDITIONAL PRE OP INSTRUCTIONS YOU RECEIVED FROM YOUR SURGEON'S OFFICE!!!  Do not eat food after Midnight the night prior to your surgery/procedure.  After Midnight you may have the following liquids until  ????       AM / PM DAY OF SURGERY  Clear  Liquid Diet Water Black Coffee (sugar ok, NO MILK/CREAM OR CREAMERS)  Tea (sugar ok, NO MILK/CREAM OR CREAMERS) regular and decaf                             Plain Jell-O  with no fruit (NO RED)                                           Fruit ices (not with fruit pulp, NO RED)                                     Popsicles (NO RED)                                                                  Juice: NO CITRUS JUICES: only apple, WHITE grape, WHITE cranberry Sports drinks like Gatorade or Powerade (NO RED)                   The day  of surgery:  Drink ONE (1) Pre-Surgery G2 at   ????     AM the morning of surgery. Drink in one sitting. Do not sip.  This drink was given to you during your hospital pre-op appointment visit. Nothing else to drink after completing the Pre-Surgery  G2 : No candy, chewing gum or throat lozenges.     Oral Hygiene is also important to reduce your risk of infection.        Remember - BRUSH YOUR TEETH THE MORNING OF SURGERY WITH YOUR REGULAR TOOTHPASTE  Do NOT smoke after Midnight the night before surgery.  STOP TAKING all Vitamins, Herbs and supplements 1 week before your surgery.   Take ONLY these medicines the morning of surgery with A SIP OF WATER: Levothyroxine. You may use your Eye Drops if needed,   DO NOT TAKE LISINOPRIL THE DAY OF YOUR SURGERY.   You may not have any metal on your body including  jewelry, and body piercing  Do not wear  lotions, powders, cologne, or deodorant  Men may shave face and neck.  Contacts, Hearing Aids, dentures or bridgework may not be worn into surgery. DENTURES WILL BE REMOVED PRIOR TO SURGERY PLEASE DO NOT APPLY "Poly grip" OR ADHESIVES!!!  Patients discharged on the day of surgery will not be allowed to drive home.  Someone NEEDS to stay with you for the first 24 hours after anesthesia.  Do not bring your home medications to the hospital. The Pharmacy will dispense medications listed on your medication list to  you during your admission in the Hospital.  Please read over the following fact sheets you were given: IF YOU HAVE QUESTIONS ABOUT YOUR PRE-OP INSTRUCTIONS, PLEASE CALL [phone]   [facility] - Preparing for Surgery Before surgery, you can play an important role.  Because skin is not sterile, your skin needs to be as free of germs as possible.  You can reduce the number of germs on your skin by washing with CHG (chlorahexidine gluconate) soap before surgery.  CHG is an antiseptic cleaner which kills germs and bonds with the skin to continue killing germs even after washing. Please DO NOT use if you have an allergy to CHG or antibacterial soaps.  If your skin becomes reddened/irritated stop using the CHG and inform your nurse when you arrive at Short Stay. Do not shave (including legs and underarms) for at least 48 hours prior to the first CHG shower.  You may shave your face/neck.  Please follow these instructions carefully:  1.  Shower with CHG Soap the night before surgery and the  morning of surgery.  2.  If you choose to wash your hair, wash your hair first as usual with your normal  shampoo.  3.  After you shampoo, rinse your hair and body thoroughly to remove the shampoo.                             4.  Use CHG as you would any other liquid soap.  You can apply chg directly to the skin and wash.  Gently with a scrungie or clean washcloth.  5.  Apply the CHG Soap to your body ONLY FROM THE NECK DOWN.   Do not use on face/ open                           Wound or open sores. Avoid contact with  eyes, ears mouth and genitals (private parts).                       Wash face,  Genitals (private parts) with your normal soap.             6.  Wash thoroughly, paying special attention to the area where your  surgery  will be performed.  7.  Thoroughly rinse your body with warm water from the neck down.  8.  DO NOT shower/wash with your normal soap after using and rinsing off the CHG Soap.             9.  Pat yourself dry with a clean towel.            10.  Wear clean pajamas.            11.  Place clean sheets on your bed the night of your first shower and do not  sleep with pets.  ON THE DAY OF SURGERY : Do not apply any lotions/deodorants the morning of surgery.  Please wear clean clothes to the hospital/surgery center.     FAILURE TO FOLLOW THESE INSTRUCTIONS MAY RESULT IN THE CANCELLATION OF YOUR SURGERY  PATIENT SIGNATURE_________________________________  NURSE SIGNATURE__________________________________  ________________________________________________________________________

## 2023-06-06 ENCOUNTER — Other Ambulatory Visit: Payer: Self-pay

## 2023-06-06 ENCOUNTER — Encounter (HOSPITAL_COMMUNITY): Payer: Self-pay

## 2023-06-06 ENCOUNTER — Encounter (HOSPITAL_COMMUNITY)
Admission: RE | Admit: 2023-06-06 | Discharge: 2023-06-06 | Disposition: A | Discharge status: Home / Self Care | Source: Ambulatory Visit | Attending: Surgery

## 2023-06-06 VITALS — BP 140/70 | HR 60 | Temp 97.8°F | Resp 16 | Ht 74.0 in | Wt 208.0 lb

## 2023-06-06 DIAGNOSIS — Z01812 Encounter for preprocedural laboratory examination: Secondary | ICD-10-CM | POA: Insufficient documentation

## 2023-06-06 DIAGNOSIS — Z01818 Encounter for other preprocedural examination: Secondary | ICD-10-CM | POA: Diagnosis present

## 2023-06-06 DIAGNOSIS — I10 Essential (primary) hypertension: Secondary | ICD-10-CM | POA: Insufficient documentation

## 2023-06-06 HISTORY — DX: Unspecified osteoarthritis, unspecified site: M19.90

## 2023-06-06 HISTORY — DX: Prediabetes: R73.03

## 2023-06-06 LAB — BASIC METABOLIC PANEL
Anion gap: 5 (ref 5–15)
BUN: 28 mg/dL — ABNORMAL HIGH (ref 8–23)
CO2: 20 mmol/L — ABNORMAL LOW (ref 22–32)
Calcium: 9.2 mg/dL (ref 8.9–10.3)
Chloride: 111 mmol/L (ref 98–111)
Creatinine, Ser: 1.22 mg/dL (ref 0.61–1.24)
GFR, Estimated: 57 mL/min — ABNORMAL LOW (ref >60)
Glucose, Bld: 109 mg/dL — ABNORMAL HIGH (ref 70–99)
Potassium: 4.3 mmol/L (ref 3.5–5.1)
Sodium: 136 mmol/L (ref 135–145)

## 2023-06-06 LAB — CBC
HCT: 34.9 % — ABNORMAL LOW (ref 39.0–52.0)
Hemoglobin: 11.2 g/dL — ABNORMAL LOW (ref 13.0–17.0)
MCH: 31.9 pg (ref 26.0–34.0)
MCHC: 32.1 g/dL (ref 30.0–36.0)
MCV: 99.4 fL (ref 80.0–100.0)
Platelets: 131 10*3/uL — ABNORMAL LOW (ref 150–400)
RBC: 3.51 MIL/uL — ABNORMAL LOW (ref 4.22–5.81)
RDW: 13 % (ref 11.5–15.5)
WBC: 4.4 10*3/uL (ref 4.0–10.5)
nRBC: 0 % (ref 0.0–0.2)

## 2023-06-09 ENCOUNTER — Encounter (HOSPITAL_COMMUNITY): Payer: Self-pay | Admitting: Surgery

## 2023-06-09 ENCOUNTER — Other Ambulatory Visit: Payer: Self-pay

## 2023-06-09 ENCOUNTER — Ambulatory Visit (HOSPITAL_COMMUNITY): Admitting: Anesthesiology

## 2023-06-09 ENCOUNTER — Other Ambulatory Visit (HOSPITAL_COMMUNITY): Payer: Self-pay

## 2023-06-09 ENCOUNTER — Ambulatory Visit (HOSPITAL_BASED_OUTPATIENT_CLINIC_OR_DEPARTMENT_OTHER): Admitting: Anesthesiology

## 2023-06-09 ENCOUNTER — Ambulatory Visit (HOSPITAL_COMMUNITY)
Admission: RE | Admit: 2023-06-09 | Discharge: 2023-06-09 | Disposition: A | Discharge status: Home / Self Care | Source: Ambulatory Visit | Attending: Surgery | Admitting: Surgery

## 2023-06-09 ENCOUNTER — Encounter (HOSPITAL_COMMUNITY)
Admission: RE | Disposition: A | Discharge status: Home / Self Care | Payer: Self-pay | Source: Ambulatory Visit | Attending: Surgery

## 2023-06-09 DIAGNOSIS — K449 Diaphragmatic hernia without obstruction or gangrene: Secondary | ICD-10-CM | POA: Diagnosis not present

## 2023-06-09 DIAGNOSIS — E039 Hypothyroidism, unspecified: Secondary | ICD-10-CM

## 2023-06-09 DIAGNOSIS — E119 Type 2 diabetes mellitus without complications: Secondary | ICD-10-CM

## 2023-06-09 DIAGNOSIS — I10 Essential (primary) hypertension: Secondary | ICD-10-CM | POA: Diagnosis not present

## 2023-06-09 DIAGNOSIS — K648 Other hemorrhoids: Secondary | ICD-10-CM | POA: Insufficient documentation

## 2023-06-09 DIAGNOSIS — Z9049 Acquired absence of other specified parts of digestive tract: Secondary | ICD-10-CM | POA: Diagnosis not present

## 2023-06-09 DIAGNOSIS — C180 Malignant neoplasm of cecum: Secondary | ICD-10-CM | POA: Insufficient documentation

## 2023-06-09 DIAGNOSIS — I2510 Atherosclerotic heart disease of native coronary artery without angina pectoris: Secondary | ICD-10-CM | POA: Diagnosis not present

## 2023-06-09 DIAGNOSIS — Z452 Encounter for adjustment and management of vascular access device: Secondary | ICD-10-CM | POA: Insufficient documentation

## 2023-06-09 DIAGNOSIS — Z800 Family history of malignant neoplasm of digestive organs: Secondary | ICD-10-CM | POA: Insufficient documentation

## 2023-06-09 DIAGNOSIS — Z87891 Personal history of nicotine dependence: Secondary | ICD-10-CM | POA: Diagnosis not present

## 2023-06-09 DIAGNOSIS — I700 Atherosclerosis of aorta: Secondary | ICD-10-CM | POA: Diagnosis not present

## 2023-06-09 DIAGNOSIS — D509 Iron deficiency anemia, unspecified: Secondary | ICD-10-CM | POA: Diagnosis not present

## 2023-06-09 DIAGNOSIS — E1122 Type 2 diabetes mellitus with diabetic chronic kidney disease: Secondary | ICD-10-CM | POA: Diagnosis not present

## 2023-06-09 DIAGNOSIS — K219 Gastro-esophageal reflux disease without esophagitis: Secondary | ICD-10-CM | POA: Insufficient documentation

## 2023-06-09 DIAGNOSIS — E1142 Type 2 diabetes mellitus with diabetic polyneuropathy: Secondary | ICD-10-CM | POA: Insufficient documentation

## 2023-06-09 DIAGNOSIS — N189 Chronic kidney disease, unspecified: Secondary | ICD-10-CM | POA: Diagnosis not present

## 2023-06-09 DIAGNOSIS — I129 Hypertensive chronic kidney disease with stage 1 through stage 4 chronic kidney disease, or unspecified chronic kidney disease: Secondary | ICD-10-CM | POA: Insufficient documentation

## 2023-06-09 DIAGNOSIS — C182 Malignant neoplasm of ascending colon: Secondary | ICD-10-CM | POA: Insufficient documentation

## 2023-06-09 DIAGNOSIS — C189 Malignant neoplasm of colon, unspecified: Secondary | ICD-10-CM | POA: Diagnosis not present

## 2023-06-09 DIAGNOSIS — K4090 Unilateral inguinal hernia, without obstruction or gangrene, not specified as recurrent: Secondary | ICD-10-CM | POA: Insufficient documentation

## 2023-06-09 HISTORY — PX: PORT-A-CATH REMOVAL: SHX5289

## 2023-06-09 LAB — GLUCOSE, CAPILLARY
Glucose-Capillary: 97 mg/dL (ref 70–99)
Glucose-Capillary: 104 mg/dL — ABNORMAL HIGH (ref 70–99)

## 2023-06-09 SURGERY — REMOVAL PORT-A-CATH
Anesthesia: Monitor Anesthesia Care

## 2023-06-09 MED ORDER — FENTANYL CITRATE (PF) 100 MCG/2ML IJ SOLN
INTRAMUSCULAR | Status: DC | PRN
Start: 2023-06-09 — End: 2023-06-09
  Administered 2023-06-09: 25 ug via INTRAVENOUS

## 2023-06-09 MED ORDER — LIDOCAINE-EPINEPHRINE 1 %-1:100000 IJ SOLN
INTRAMUSCULAR | Status: DC | PRN
  Administered 2023-06-09: 5 mL

## 2023-06-09 MED ORDER — PROPOFOL 500 MG/50ML IV EMUL
INTRAVENOUS | Status: DC | PRN
  Administered 2023-06-09: 130 ug/kg/min via INTRAVENOUS

## 2023-06-09 MED ORDER — CHLORHEXIDINE GLUCONATE 0.12 % MT SOLN
15.0000 mL | Freq: Once | OROMUCOSAL | Status: AC
  Administered 2023-06-09: 15 mL via OROMUCOSAL

## 2023-06-09 MED ORDER — FENTANYL CITRATE (PF) 100 MCG/2ML IJ SOLN
INTRAMUSCULAR | Status: AC
  Filled 2023-06-09: qty 2

## 2023-06-09 MED ORDER — FENTANYL CITRATE PF 50 MCG/ML IJ SOSY: 25.0000 ug | PREFILLED_SYRINGE | INTRAMUSCULAR | Status: DC | PRN

## 2023-06-09 MED ORDER — OXYCODONE HCL 5 MG PO TABS
5.0000 mg | ORAL_TABLET | Freq: Four times a day (QID) | ORAL | 0 refills | Status: AC | PRN
  Filled 2023-06-09: qty 5, 2d supply, fill #0

## 2023-06-09 MED ORDER — CEFAZOLIN SODIUM-DEXTROSE 2-4 GM/100ML-% IV SOLN
2.0000 g | INTRAVENOUS | Status: AC
  Administered 2023-06-09: 2 g via INTRAVENOUS
  Filled 2023-06-09: qty 100

## 2023-06-09 MED ORDER — LACTATED RINGERS IV SOLN: INTRAVENOUS | Status: DC

## 2023-06-09 MED ORDER — ONDANSETRON HCL 4 MG/2ML IJ SOLN
INTRAMUSCULAR | Status: DC | PRN
Start: 2023-06-09 — End: 2023-06-09
  Administered 2023-06-09: 4 mg via INTRAVENOUS

## 2023-06-09 MED ORDER — ACETAMINOPHEN 500 MG PO TABS
1000.0000 mg | ORAL_TABLET | ORAL | Status: AC
  Administered 2023-06-09: 1000 mg via ORAL
  Filled 2023-06-09: qty 2

## 2023-06-09 MED ORDER — ORAL CARE MOUTH RINSE: 15.0000 mL | Freq: Once | OROMUCOSAL | Status: AC

## 2023-06-09 MED ORDER — LIDOCAINE-EPINEPHRINE 1 %-1:100000 IJ SOLN
INTRAMUSCULAR | Status: AC
  Filled 2023-06-09: qty 1

## 2023-06-09 MED ORDER — CHLORHEXIDINE GLUCONATE CLOTH 2 % EX PADS: 6.0000 | MEDICATED_PAD | Freq: Once | CUTANEOUS | Status: DC

## 2023-06-09 MED ORDER — 0.9 % SODIUM CHLORIDE (POUR BTL) OPTIME
TOPICAL | Status: DC | PRN
  Administered 2023-06-09: 1000 mL

## 2023-06-09 SURGICAL SUPPLY — 29 items
BAG COUNTER SPONGE SURGICOUNT (BAG) IMPLANT
BAG DECANTER FOR FLEXI CONT (MISCELLANEOUS) ×1 IMPLANT
BLADE SURG 15 STRL LF DISP TIS (BLADE) ×1 IMPLANT
BLADE SURG SZ11 CARB STEEL (BLADE) ×1 IMPLANT
CHLORAPREP W/TINT 26 (MISCELLANEOUS) ×1 IMPLANT
DERMABOND ADVANCED .7 DNX12 (GAUZE/BANDAGES/DRESSINGS) IMPLANT
DRAPE LAPAROSCOPIC ABDOMINAL (DRAPES) ×1 IMPLANT
DRSG TEGADERM 2-3/8X2-3/4 SM (GAUZE/BANDAGES/DRESSINGS) IMPLANT
DRSG TEGADERM 4X4.75 (GAUZE/BANDAGES/DRESSINGS) IMPLANT
ELECT PENCIL ROCKER SW 15FT (MISCELLANEOUS) IMPLANT
ELECT REM PT RETURN 15FT ADLT (MISCELLANEOUS) ×1 IMPLANT
GAUZE 4X4 16PLY ~~LOC~~+RFID DBL (SPONGE) ×1 IMPLANT
GAUZE SPONGE 4X4 12PLY STRL (GAUZE/BANDAGES/DRESSINGS) IMPLANT
GLOVE BIO SURGEON STRL SZ7.5 (GLOVE) ×1 IMPLANT
GLOVE INDICATOR 8.0 STRL GRN (GLOVE) ×1 IMPLANT
GOWN STRL REUS W/ TWL XL LVL3 (GOWN DISPOSABLE) ×1 IMPLANT
KIT BASIN OR (CUSTOM PROCEDURE TRAY) ×1 IMPLANT
KIT TURNOVER KIT A (KITS) IMPLANT
NDL HYPO 25X1 1.5 SAFETY (NEEDLE) ×1 IMPLANT
NEEDLE HYPO 25X1 1.5 SAFETY (NEEDLE) ×1 IMPLANT
NS IRRIG 1000ML POUR BTL (IV SOLUTION) ×1 IMPLANT
PACK BASIC VI WITH GOWN DISP (CUSTOM PROCEDURE TRAY) ×1 IMPLANT
SET PORT ACCESS POWERLOC (SET/KITS/TRAYS/PACK) IMPLANT
STRIP CLOSURE SKIN 1/2X4 (GAUZE/BANDAGES/DRESSINGS) IMPLANT
SUT MNCRL AB 4-0 PS2 18 (SUTURE) ×1 IMPLANT
SUT VIC AB 3-0 SH 27XBRD (SUTURE) ×1 IMPLANT
SYR 10ML LL (SYRINGE) ×1 IMPLANT
SYR 20ML LL LF (SYRINGE) ×2 IMPLANT
TOWEL OR 17X26 10 PK STRL BLUE (TOWEL DISPOSABLE) ×1 IMPLANT

## 2023-06-09 NOTE — Anesthesia Preprocedure Evaluation (Addendum)
 Anesthesia Evaluation  Patient identified by MRN, date of birth, ID band Patient awake    Reviewed: Allergy & Precautions, NPO status , Patient's Chart, lab work & pertinent test results  Airway Mallampati: II  TM Distance: >3 FB Neck ROM: Full    Dental no notable dental hx.    Pulmonary former smoker   Pulmonary exam normal        Cardiovascular hypertension, Pt. on medications  Rhythm:Regular Rate:Normal     Neuro/Psych  negative psych ROS   GI/Hepatic Neg liver ROS, hiatal hernia,GERD  ,,  Endo/Other  diabetesHypothyroidism    Renal/GU CRFRenal disease     Musculoskeletal  (+) Arthritis , Osteoarthritis,    Abdominal Normal abdominal exam  (+)   Peds  Hematology  (+) Blood dyscrasia, anemia   Anesthesia Other Findings   Reproductive/Obstetrics                              Anesthesia Physical Anesthesia Plan  ASA: 3  Anesthesia Plan: MAC   Post-op Pain Management:    Induction: Intravenous  PONV Risk Score and Plan: 1 and Ondansetron, Dexamethasone, Propofol infusion and Treatment may vary due to age or medical condition  Airway Management Planned: Simple Face Mask and Nasal Cannula  Additional Equipment: None  Intra-op Plan:   Post-operative Plan:   Informed Consent: I have reviewed the patients History and Physical, chart, labs and discussed the procedure including the risks, benefits and alternatives for the proposed anesthesia with the patient or authorized representative who has indicated his/her understanding and acceptance.     Dental advisory given  Plan Discussed with: CRNA  Anesthesia Plan Comments:         Anesthesia Quick Evaluation

## 2023-06-09 NOTE — Anesthesia Procedure Notes (Signed)
 Procedure Name: MAC Date/Time: 06/09/2023 3:40 PM  Performed by: Nelle Don, CRNAPre-anesthesia Checklist: Patient identified, Emergency Drugs available, Suction available and Patient being monitored Oxygen Delivery Method: Simple face mask

## 2023-06-09 NOTE — Anesthesia Postprocedure Evaluation (Signed)
 Anesthesia Post Note  Patient: Joseph Pennington  Procedure(s) Performed: REMOVAL PORT-A-CATH     Patient location during evaluation: PACU Anesthesia Type: MAC Level of consciousness: awake and alert Pain management: pain level controlled Vital Signs Assessment: post-procedure vital signs reviewed and stable Respiratory status: spontaneous breathing, nonlabored ventilation, respiratory function stable and patient connected to nasal cannula oxygen Cardiovascular status: stable and blood pressure returned to baseline Postop Assessment: no apparent nausea or vomiting Anesthetic complications: no   No notable events documented.  Last Vitals:  Vitals:   06/09/23 1657 06/09/23 1700  BP: (!) 143/57 (!) 139/58  Pulse: 64 (!) 44  Resp: 18   Temp: (!) 36.4 C   SpO2: 100% 100%    Last Pain:  Vitals:   06/09/23 1657  TempSrc: Oral  PainSc: 0-No pain                 Earl Lites P Hesper Venturella

## 2023-06-09 NOTE — Op Note (Signed)
 06/09/2023  4:09 PM  PATIENT:  Joseph Pennington  89 y.o. male  Patient Care Team: Noberto Retort, MD as PCP - General (Family Medicine) Malachy Mood, MD as Consulting Physician (Hematology and Oncology)  PRE-OPERATIVE DIAGNOSIS:  Colon cancer, indwelling port-a-catheter  POST-OPERATIVE DIAGNOSIS:  Same  PROCEDURE:  Removal of port-a-catheter (tunneled, right internal jugular)  SURGEON:  Stephanie Coup. Anely Spiewak, MD  ANESTHESIA:   local and MAC  COUNTS:  Sponge, needle and instrument counts were reported correct x2 at the conclusion of the operation.  EBL: 3 mL  DRAINS: none  SPECIMEN: none  COMPLICATIONS: none  FINDINGS: Tunneled right internal jugular vein portacatheter removed uneventfully.  All parts intact.  DISPOSITION: PACU in satisfactory condition  DESCRIPTION: The patient was identified in preop holding and taken to the OR where he was placed on the operating room table. SCDs were placed.  Pressure points were evaluated and padded.  MAC anesthesia was induced without difficulty. He was then prepped and draped in the usual sterile fashion. A surgical timeout was performed indicating the correct patient, procedure, positioning and need for preoperative antibiotics.   The previously placed port in the right chest is identified.  Local anesthetic consisting of 1% lidocaine with epinephrine is infiltrated.  A skin incision is created incorporating the prior scar.  Subcutaneous tissue device electrocautery.  The portacatheter is encountered.  The capsule is incised.  The securing Prolene sutures are trimmed and removed.  By our anesthetist, pressure was applied to the right IJ puncture site and the portacatheter was removed.  The portacatheter was inspected.  It is without any apparent damage and all parts present.  The tip is normal in appearance.  This was passed off and subsequently to be discarded.  A 3-0 Vicryl suture was used to create a figure-of-eight stitch at the capsule  tubing site in the pocket.  The wound is irrigated.  Hemostasis is verified.  It is then closed in layers using 3-0 Vicryl deep dermal sutures followed by running 4-0 Monocryl subcuticular suture.  All sponge, needle, and instrument counts are reported correct.  The wound was washed and dried.  Dermabond is applied.  He is awakened from anesthesia where he was then transferred to a stretcher for transport to recovery in satisfactory condition.

## 2023-06-09 NOTE — Discharge Instructions (Addendum)
POST OP INSTRUCTIONS  DIET: As tolerated. Follow a light bland diet the first 24 hours after arrival home, such as soup, liquids, crackers, etc.  Be sure to include lots of fluids daily.  Avoid fast food or heavy meals as your are more likely to get nauseated.  Eat a low fat the next few days after surgery.  Take your usually prescribed home medications unless otherwise directed.  PAIN CONTROL: Pain is best controlled by a usual combination of three different methods TOGETHER: Ice/Heat Over the counter pain medication Prescription pain medication Most patients will experience some swelling and bruising around the surgical site.  Ice packs or heating pads (30-60 minutes up to 6 times a day) will help. Some people prefer to use ice alone, heat alone, alternating between ice & heat.  Experiment to what works for you.  Swelling and bruising can take several weeks to resolve.   It is helpful to take an over-the-counter pain medication regularly for the first few weeks: Ibuprofen (Motrin/Advil) - '200mg'$  tabs - take 3 tabs ('600mg'$ ) every 6 hours as needed for pain Acetaminophen (Tylenol) - you may take '650mg'$  every 6 hours as needed. You can take this with motrin as they act differently on the body. If you are taking a narcotic pain medication that has acetaminophen in it, do not take over the counter tylenol at the same time.  Iii. NOTE: You may take both of these medications together - most patients  find it most helpful when alternating between the two (i.e. Ibuprofen at 6am,  tylenol at 9am, ibuprofen at 12pm ..[name]) A  prescription for pain medication should be given to you upon discharge.  Take your pain medication as prescribed if your pain is not adequatly controlled with the over-the-counter pain reliefs mentioned above.  Avoid getting constipated.  Between the surgery and the pain medications, it is common to experience some constipation.  Increasing fluid intake and taking a fiber supplement (such as  Metamucil, Citrucel, FiberCon, MiraLax, etc) 1-2 times a day regularly will usually help prevent this problem from occurring.  A mild laxative (prune juice, Milk of Magnesia, MiraLax, etc) should be taken according to package directions if there are no bowel movements after 48 hours.    Dressing: Your incision is covered in Dermabond which is like sterile superglue for the skin. This will come off on it's own in a couple weeks. It is waterproof and you may bathe normally starting the day after your surgery in a shower. Avoid baths/pools/lakes/oceans until your wounds have fully healed.  ACTIVITIES as tolerated:   Avoid heavy lifting (>10lbs or 1 gallon of milk) for the next 6 weeks. You may resume regular (light) daily activities beginning the next day--such as daily self-care, walking, climbing stairs--gradually increasing activities as tolerated.  If you can walk 30 minutes without difficulty, it is safe to try more intense activity such as jogging, treadmill, bicycling, low-impact aerobics.  DO NOT PUSH THROUGH PAIN.  Let pain be your guide: If it hurts to do something, don't do it. You may drive when you are no longer taking prescription pain medication, you can comfortably wear a seatbelt, and you can safely maneuver your car and apply brakes.   FOLLOW UP in our office Please call [facility] at [phone] to set up an appointment to see your surgeon in the office for a follow-up appointment approximately 2 weeks after your surgery. Make sure that you call for this appointment the day you arrive home to  insure a convenient appointment time.  9. If you have disability or family leave forms that need to be completed, you may have them completed by your primary care physician's office; for return to work instructions, please ask our office staff and they will be happy to assist you in obtaining this documentation   When to call us [phone]: Poor pain control Reactions / problems with new  medications (rash/itching, etc)  Fever over 101.5 F (38.5 C) Inability to urinate Nausea/vomiting Worsening swelling or bruising Continued bleeding from incision. Increased pain, redness, or drainage from the incision  The clinic staff is available to answer your questions during regular business hours (8:30am-5pm).  Please don't hesitate to call and ask to speak to one of our nurses for clinical concerns.   A surgeon from [facility] is always on call at the hospitals   If you have a medical emergency, go to the nearest emergency room or call 911.  [facility] [address], [address], [place], [place]  [address] MAIN: [phone] FAX: [phone] [email or web address]  

## 2023-06-09 NOTE — Transfer of Care (Signed)
 Immediate Anesthesia Transfer of Care Note  Patient: Joseph Pennington  Procedure(s) Performed: REMOVAL PORT-A-CATH  Patient Location: PACU  Anesthesia Type:MAC  Level of Consciousness: drowsy  Airway & Oxygen Therapy: Patient Spontanous Breathing and Patient connected to face mask oxygen  Post-op Assessment: Report given to RN, Post -op Vital signs reviewed and stable, and Patient moving all extremities X 4  Post vital signs: Reviewed and stable  Last Vitals:  Vitals Value Taken Time  BP 108/53 06/09/23 1612  Temp    Pulse 60 06/09/23 1613  Resp 8 06/09/23 1613  SpO2 99 % 06/09/23 1613  Vitals shown include unfiled device data.  Last Pain:  Vitals:   06/09/23 1147  TempSrc:   PainSc: 0-No pain         Complications: No notable events documented.

## 2023-06-09 NOTE — H&P (Signed)
 CC: Here today for surgery  HPI: Joseph Pennington is an 89 y.o. male with history of HTN, CKD, DM (last A1c off tx 5.8), iron deficiency anemia, CAD, hypothyroidism, GERD, whom is seen in the office today as a referral by Dr. Seymour Bars for evaluation of newly diagnosed ascending/cecal colon cancer.  He underwent colonoscopy with Dr. Leonides Schanz 07/23/2021 which demonstrated: 1. Frond-like villous fungating and ulcerated partially obstructing mass in the ascending colon. Pinpoint opening that they were not able to pass the ultraslim scope through. Encompasses entire cecum. Mass is circumferential. Mass measured 15 cm in length. Biopsies were taken and area tattooed with spot. 2. 6 sessile polyps removed from the rectum and transverse colon. 3. Nonbleeding internal hemorrhoids.  Path-poorly differentiated adenocarcinoma  CT CAP 07/30/21 -  1. Masslike region in the ascending colon extending to the level of the hepatic flexure and right lower quadrant, concerning for malignancy. Associated pericolonic fat stranding and a few prominent lymph nodes. No distant metastasis seen. 2. Left inguinal hernia containing nonobstructed colon 3. Nonspecific groundglass opacity in right lower lobe. Attention on follow-up. 4. Small hiatal hernia. 5. Aortic atherosclerosis and coronary artery calcifications  He denies any complaints at this time. He does note over the last year he has had some intermittent right-sided abdominal pains. No nausea or vomiting. He has had an approximate 15 pound weight loss over the last 12 months. He recently had his Synthroid dose increased and had attributed his weight loss to this change. Denies any significant or evident blood in his stool.  OR 09/14/21 -  Laparoscopic converted to open right hemicolectomy Repair of small bowel serosa x 2  PATH:  A. COLON, RIGHT, HEMI COLECTOMY:  Poorly differentiated adenocarcinoma in the ascending colon with clear  margins of resection.  Please  see the following synoptic report.   COLON AND RECTUM, CARCINOMA: Resection, Including Transanal Disk  Excision of Rectal Neoplasms   Procedure: Right hemicolectomy.  Tumor Site: Ascending colon.  Tumor Size: Circumferential measuring 9.0 cm X 8.0 cm. X 2.5 cm.  Macroscopic Tumor Perforation: Not identified  Histologic Type: Adenocarcinoma.  Histologic Grade: Grade 3, poorly differentiated.  Multiple Primary Sites: Not applicable  Tumor Extension: Extends through the muscular wall into the pericolonic  adipose tissue.  Lymphovascular Invasion: Not identified.  Perineural Invasion: Not identified.  Treatment Effect: No known presurgical therapy.  Margins:  Margin Status for Invasive Carcinoma: All margins are negative for  invasive carcinoma  Distance from Invasive Carcinoma to Radial (Circumferential) Margin  (required for rectal  tumors): Not applicable (not a rectal tumor)  Distance from Invasive Carcinoma to proximal margin: 11 cm.  Distance from invasive carcinoma to distal margin: 12 cm.  Regional Lymph Nodes:  Number of Lymph Nodes with Tumor: None.  Number of Lymph Nodes Examined: Twenty-two (22)  Tumor Deposits: Present (slide A11)  Distant Metastasis:  Distant Site(s) Involved: None known.  Pathologic Stage Classification (pTNM, AJCC 8th Edition): pT3 pN1c  Ancillary Studies: MMR / MSI testing will be ordered on block A7.  Representative Tumor Block: A7  Comments: Omentum is free of neoplastic invasion.  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   Case was presented at our multidisciplinary tumor board. We discussed that we are certainly concerned that the radial margin is involved based on our findings during the procedure. With that and his lymph node status, discussed adjuvant therapy options. This was recommended by the tumor  board.   He has now completed adjuvant chemotherapy followed by  chemoradiation. He completed chemoradiation 03/26/2022. He has some fatigue particularly towards the last week of his radiation that has persisted. No nausea or vomiting. His appetite has been steadily improving. It has gotten much better in the last week. No fever or chills. He has had some diarrhea more prevalent during his radiation treatment. He will take a dose of Imodium at night but not during the day. His stools are typically loose and watery. He is here today with his son. He has no other complaints at this time.   INTERVAL HX No complaints at present. He has been doing well. Being treated for anemia-iron studies showed normal iron levels. He is also getting B12 injections. His energy levels are improved. His appetite is great and he has had a fair amount of weight gain. No nausea or vomiting.   He denies any changes in health or health history since we met in the office. No new medications/allergies. We have been asked by medical oncology to remove his port-a-catheter. Last chemo infusion ~6 months ago, then have cXRT. Port has been deemed no longer necessary He states he is ready for surgery today.  Past Medical History:  Diagnosis Date   Acute cholecystitis 02/19/2018   Anemia    Arthritis    Chronic kidney disease    Colon cancer (HCC)    GERD (gastroesophageal reflux disease)    History of blood transfusion 08/2021   History of hiatal hernia    Hypertension    Hypothyroidism    Iron deficiency anemia    Peripheral neuropathy    bilateral feet   Pre-diabetes     Past Surgical History:  Procedure Laterality Date   CATARACT EXTRACTION Bilateral    CHOLECYSTECTOMY N/A 02/20/2018   Procedure: LAPAROSCOPIC CHOLECYSTECTOMY;  Surgeon: Glenna Fellows, MD;  Location: [facility];  Service: General;  Laterality: N/A;   COLONOSCOPY  2023   LAPAROSCOPIC RIGHT HEMI COLECTOMY Right 09/14/2021   Procedure: LAPAROSCOPIC TO OPEN RIGHT HEMI COLECTOMY;  Surgeon: Andria Meuse, MD;   Location: [facility];  Service: General;  Laterality: Right;   PORTACATH PLACEMENT N/A 10/15/2021   Procedure: INSERTION PORT-A-CATH;  Surgeon: Andria Meuse, MD;  Location: [facility];  Service: General;  Laterality: N/A;  with ultrasound and flouroscopic guidance   UPPER GASTROINTESTINAL ENDOSCOPY      Family History  Problem Relation Age of Onset   Rectal cancer Mother    Heart attack Father    Stomach cancer Neg Hx    Colon cancer Neg Hx    Esophageal cancer Neg Hx    Pancreatic cancer Neg Hx     Social:  reports that he quit smoking about 45 years ago. His smoking use included cigarettes. He started smoking about 65 years ago. He has a 20 pack-year smoking history. He has never used smokeless tobacco. He reports that he does not currently use alcohol. He reports that he does not currently use drugs.  Allergies: No Known Allergies  Medications: I have reviewed the patient's current medications.  Results for orders placed or performed during the hospital encounter of 06/09/23 (from the past 48 hours)  Glucose, capillary     Status: None   Collection Time: 06/09/23 11:44 AM  Result Value Ref Range   Glucose-Capillary 97 70 - 99 mg/dL    Comment: Glucose reference range applies only to samples taken after fasting for at least 8 hours.    No  results found.   PE Blood pressure (!) 168/84, pulse 60, temperature (!) 97.5 F (36.4 C), temperature source Oral, resp. rate 16, height 6\' 2"  (1.88 m), weight 94.3 kg, SpO2 100%. Constitutional: NAD; conversant Eyes: Moist conjunctiva; no lid lag; anicteric Lungs: Normal respiratory effort CV: RRR Neck/chest: Right sided port-a-catheter in place Psychiatric: Appropriate affect  Results for orders placed or performed during the hospital encounter of 06/09/23 (from the past 48 hours)  Glucose, capillary     Status: None   Collection Time: 06/09/23 11:44 AM  Result Value Ref Range   Glucose-Capillary 97 70 - 99 mg/dL    Comment:  Glucose reference range applies only to samples taken after fasting for at least 8 hours.    No results found.  A/P: Joseph Pennington is an 89 y.o. male with hx of HTN, iron deficiency anemia, CAD, hypothyroidism, GERD, now s/p laparoscopic right hemicolectomy, repair of small bowel serosa 09/14/21 - here for postop f/u.  Path: Poorly differentiated adenocarcinoma, pT3 pN1c with negative margins reported, however, intraoperatively, there was involvement of right kidney and potentially 2nd portion of duodenum. This concern has been raised at tumor board  -From prior, "We spent time reviewing his procedure, findings, and pathology again today. 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 He was thankful that we did not put him through a nephrectomy which would have carried risk of dialysis dependency with his underlying CKD status and additionally, increase the morbidity of his surgery. May also have required Whipple for the duodenal part involvement."  Completed additional systemic + cXRT   -We have discussed port-a-catheter removal and rational as no longer necessary -The planned procedure, material risks (including, but not limited to, pain, bleeding, infection, scarring, need for blood transfusion, potential need for additional procedures, worsening of pre-existing medical conditions, aspiration, pneumonia, heart attack, stroke, death) benefits and alternatives to surgery were discussed at length. The patient's and his son's questions were answered to their satisfaction, they voiced understanding and elected to proceed with surgery. Additionally, we discussed typical postoperative expectations and the recovery process.  Marin Olp, MD [facility], [facility]

## 2023-06-10 ENCOUNTER — Encounter (HOSPITAL_COMMUNITY): Payer: Self-pay | Admitting: Surgery

## 2023-06-15 DIAGNOSIS — N183 Chronic kidney disease, stage 3 unspecified: Secondary | ICD-10-CM | POA: Diagnosis not present

## 2023-06-15 DIAGNOSIS — I10 Essential (primary) hypertension: Secondary | ICD-10-CM | POA: Diagnosis not present

## 2023-06-15 DIAGNOSIS — E538 Deficiency of other specified B group vitamins: Secondary | ICD-10-CM | POA: Diagnosis not present

## 2023-06-15 DIAGNOSIS — E119 Type 2 diabetes mellitus without complications: Secondary | ICD-10-CM | POA: Diagnosis not present

## 2023-06-20 DIAGNOSIS — E039 Hypothyroidism, unspecified: Secondary | ICD-10-CM | POA: Diagnosis not present

## 2023-06-20 DIAGNOSIS — Z85038 Personal history of other malignant neoplasm of large intestine: Secondary | ICD-10-CM | POA: Diagnosis not present

## 2023-06-20 DIAGNOSIS — I10 Essential (primary) hypertension: Secondary | ICD-10-CM | POA: Diagnosis not present

## 2023-06-20 DIAGNOSIS — E119 Type 2 diabetes mellitus without complications: Secondary | ICD-10-CM | POA: Diagnosis not present

## 2023-07-14 DIAGNOSIS — N183 Chronic kidney disease, stage 3 unspecified: Secondary | ICD-10-CM | POA: Diagnosis not present

## 2023-07-14 DIAGNOSIS — E119 Type 2 diabetes mellitus without complications: Secondary | ICD-10-CM | POA: Diagnosis not present

## 2023-07-14 DIAGNOSIS — I10 Essential (primary) hypertension: Secondary | ICD-10-CM | POA: Diagnosis not present

## 2023-07-15 DIAGNOSIS — E538 Deficiency of other specified B group vitamins: Secondary | ICD-10-CM | POA: Diagnosis not present

## 2023-07-20 DIAGNOSIS — E119 Type 2 diabetes mellitus without complications: Secondary | ICD-10-CM | POA: Diagnosis not present

## 2023-07-20 DIAGNOSIS — Z85038 Personal history of other malignant neoplasm of large intestine: Secondary | ICD-10-CM | POA: Diagnosis not present

## 2023-07-20 DIAGNOSIS — N183 Chronic kidney disease, stage 3 unspecified: Secondary | ICD-10-CM | POA: Diagnosis not present

## 2023-07-20 DIAGNOSIS — E039 Hypothyroidism, unspecified: Secondary | ICD-10-CM | POA: Diagnosis not present

## 2023-07-20 DIAGNOSIS — I10 Essential (primary) hypertension: Secondary | ICD-10-CM | POA: Diagnosis not present

## 2023-07-26 ENCOUNTER — Other Ambulatory Visit: Payer: Self-pay

## 2023-08-13 DIAGNOSIS — I10 Essential (primary) hypertension: Secondary | ICD-10-CM | POA: Diagnosis not present

## 2023-08-13 DIAGNOSIS — E119 Type 2 diabetes mellitus without complications: Secondary | ICD-10-CM | POA: Diagnosis not present

## 2023-08-13 DIAGNOSIS — N183 Chronic kidney disease, stage 3 unspecified: Secondary | ICD-10-CM | POA: Diagnosis not present

## 2023-08-16 DIAGNOSIS — E538 Deficiency of other specified B group vitamins: Secondary | ICD-10-CM | POA: Diagnosis not present

## 2023-08-20 DIAGNOSIS — E039 Hypothyroidism, unspecified: Secondary | ICD-10-CM | POA: Diagnosis not present

## 2023-08-20 DIAGNOSIS — N183 Chronic kidney disease, stage 3 unspecified: Secondary | ICD-10-CM | POA: Diagnosis not present

## 2023-08-20 DIAGNOSIS — Z85038 Personal history of other malignant neoplasm of large intestine: Secondary | ICD-10-CM | POA: Diagnosis not present

## 2023-08-20 DIAGNOSIS — E119 Type 2 diabetes mellitus without complications: Secondary | ICD-10-CM | POA: Diagnosis not present

## 2023-08-20 DIAGNOSIS — I10 Essential (primary) hypertension: Secondary | ICD-10-CM | POA: Diagnosis not present

## 2023-09-12 DIAGNOSIS — E119 Type 2 diabetes mellitus without complications: Secondary | ICD-10-CM | POA: Diagnosis not present

## 2023-09-12 DIAGNOSIS — I10 Essential (primary) hypertension: Secondary | ICD-10-CM | POA: Diagnosis not present

## 2023-09-12 DIAGNOSIS — N183 Chronic kidney disease, stage 3 unspecified: Secondary | ICD-10-CM | POA: Diagnosis not present

## 2023-09-16 DIAGNOSIS — E538 Deficiency of other specified B group vitamins: Secondary | ICD-10-CM | POA: Diagnosis not present

## 2023-09-19 DIAGNOSIS — I10 Essential (primary) hypertension: Secondary | ICD-10-CM | POA: Diagnosis not present

## 2023-09-19 DIAGNOSIS — N183 Chronic kidney disease, stage 3 unspecified: Secondary | ICD-10-CM | POA: Diagnosis not present

## 2023-09-19 DIAGNOSIS — E119 Type 2 diabetes mellitus without complications: Secondary | ICD-10-CM | POA: Diagnosis not present

## 2023-09-19 DIAGNOSIS — E039 Hypothyroidism, unspecified: Secondary | ICD-10-CM | POA: Diagnosis not present

## 2023-09-19 DIAGNOSIS — Z85038 Personal history of other malignant neoplasm of large intestine: Secondary | ICD-10-CM | POA: Diagnosis not present

## 2023-09-28 ENCOUNTER — Other Ambulatory Visit: Payer: Self-pay

## 2023-09-28 DIAGNOSIS — D500 Iron deficiency anemia secondary to blood loss (chronic): Secondary | ICD-10-CM

## 2023-09-28 DIAGNOSIS — C182 Malignant neoplasm of ascending colon: Secondary | ICD-10-CM

## 2023-09-28 NOTE — Assessment & Plan Note (Signed)
 pT3pN1cM0 stage IIIB, loss of MLH1 and PMS2, BRAF V600E+, MLH1 hypermethylation  -diagnosed 07/2021  -s/p open right colectomy by Dr. Cliffton Asters 09/14/21 -he completed 6 cycles adjuvant FOLFOX 10/22/21 - 01/13/22.  -he completed chemoRT with Xeloda on 03/26/2022 -continue, physical exam and lab test (including CBC, CMP and CEA) every 3 months for the first 2 years, then every 6-12 months, colonoscopy in one year, and surveilliance CT scan every 6-12 month for up to 5 year.  -surveillance CT from 05/24/22 showed NED, new small bilateral pleural effusion, right more than left.  He also has mild lower extremity edema.  Not sure about the etiology of his pleural effusion, will watch it for now, he is asymptomatic. -Continue colon cancer surveillance.  He is due for colonoscopy in June 2024. -Surveillance CT scan from 05/23/2023 showed no evidence of recurrence.  He is clinically doing well and no concerns.

## 2023-09-29 ENCOUNTER — Inpatient Hospital Stay: Attending: Hematology

## 2023-09-29 ENCOUNTER — Inpatient Hospital Stay (HOSPITAL_BASED_OUTPATIENT_CLINIC_OR_DEPARTMENT_OTHER): Admitting: Hematology

## 2023-09-29 VITALS — BP 143/63 | HR 62 | Temp 97.8°F | Resp 13 | Ht 74.0 in | Wt 216.7 lb

## 2023-09-29 DIAGNOSIS — E039 Hypothyroidism, unspecified: Secondary | ICD-10-CM | POA: Diagnosis not present

## 2023-09-29 DIAGNOSIS — Z85038 Personal history of other malignant neoplasm of large intestine: Secondary | ICD-10-CM | POA: Diagnosis not present

## 2023-09-29 DIAGNOSIS — C182 Malignant neoplasm of ascending colon: Secondary | ICD-10-CM

## 2023-09-29 DIAGNOSIS — D649 Anemia, unspecified: Secondary | ICD-10-CM | POA: Insufficient documentation

## 2023-09-29 DIAGNOSIS — D500 Iron deficiency anemia secondary to blood loss (chronic): Secondary | ICD-10-CM

## 2023-09-29 LAB — FERRITIN: Ferritin: 92 ng/mL (ref 24–336)

## 2023-09-29 LAB — CBC WITH DIFFERENTIAL (CANCER CENTER ONLY)
Abs Immature Granulocytes: 0.01 K/uL (ref 0.00–0.07)
Basophils Absolute: 0 K/uL (ref 0.0–0.1)
Basophils Relative: 0 %
Eosinophils Absolute: 0.1 K/uL (ref 0.0–0.5)
Eosinophils Relative: 2 %
HCT: 32.7 % — ABNORMAL LOW (ref 39.0–52.0)
Hemoglobin: 11.1 g/dL — ABNORMAL LOW (ref 13.0–17.0)
Immature Granulocytes: 0 %
Lymphocytes Relative: 11 %
Lymphs Abs: 0.5 K/uL — ABNORMAL LOW (ref 0.7–4.0)
MCH: 31.9 pg (ref 26.0–34.0)
MCHC: 33.9 g/dL (ref 30.0–36.0)
MCV: 94 fL (ref 80.0–100.0)
Monocytes Absolute: 0.4 K/uL (ref 0.1–1.0)
Monocytes Relative: 8 %
Neutro Abs: 3.7 K/uL (ref 1.7–7.7)
Neutrophils Relative %: 79 %
Platelet Count: 145 K/uL — ABNORMAL LOW (ref 150–400)
RBC: 3.48 MIL/uL — ABNORMAL LOW (ref 4.22–5.81)
RDW: 13.2 % (ref 11.5–15.5)
WBC Count: 4.6 K/uL (ref 4.0–10.5)
nRBC: 0 % (ref 0.0–0.2)

## 2023-09-29 LAB — IRON AND IRON BINDING CAPACITY (CC-WL,HP ONLY)
Iron: 81 ug/dL (ref 45–182)
Saturation Ratios: 26 % (ref 17.9–39.5)
TIBC: 311 ug/dL (ref 250–450)
UIBC: 230 ug/dL (ref 117–376)

## 2023-09-29 LAB — CMP (CANCER CENTER ONLY)
ALT: 19 U/L (ref 0–44)
AST: 26 U/L (ref 15–41)
Albumin: 4.1 g/dL (ref 3.5–5.0)
Alkaline Phosphatase: 107 U/L (ref 38–126)
Anion gap: 5 (ref 5–15)
BUN: 31 mg/dL — ABNORMAL HIGH (ref 8–23)
CO2: 24 mmol/L (ref 22–32)
Calcium: 9.4 mg/dL (ref 8.9–10.3)
Chloride: 112 mmol/L — ABNORMAL HIGH (ref 98–111)
Creatinine: 1.55 mg/dL — ABNORMAL HIGH (ref 0.61–1.24)
GFR, Estimated: 43 mL/min — ABNORMAL LOW (ref >60)
Glucose, Bld: 108 mg/dL — ABNORMAL HIGH (ref 70–99)
Potassium: 4.3 mmol/L (ref 3.5–5.1)
Sodium: 141 mmol/L (ref 135–145)
Total Bilirubin: 0.8 mg/dL (ref 0.0–1.2)
Total Protein: 7.1 g/dL (ref 6.5–8.1)

## 2023-09-29 LAB — CEA (ACCESS): CEA (CHCC): <1 ng/mL (ref 0.00–5.00)

## 2023-09-29 NOTE — Progress Notes (Signed)
 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 Teresa 09/14/21 -he completed 6 cycles adjuvant FOLFOX 10/22/21 - 01/13/22.  -he completed chemoRT with Xeloda  on 03/26/2022 -continue, physical exam and lab test (including CBC, CMP and CEA) every 3 months for the first 2 years, then every 6-12 months, colonoscopy in one year, and surveilliance CT scan every 6-12 month for up to 5 year.  -surveillance CT from 05/24/22 showed NED, new small bilateral pleural effusion, right more than left.  He also has mild lower extremity edema.  Not sure about the etiology of his pleural effusion, will watch it for now, he is asymptomatic. -Continue colon cancer surveillance.  He is due for colonoscopy in June 2024. -Surveillance CT scan from 05/23/2023 showed no evidence of recurrence.  He is clinically doing well and no concerns.   Assessment & Plan Colon cancer Colon cancer diagnosed in June 2023, currently in follow-up. No specific concerns or symptoms reported. Weight gain of 7 pounds since December last year, indicating good nutritional status. No abdominal pain, bloating, or bowel movement issues. Last CT scan in March 2025 showed no issues. Risk of recurrence decreases after the first two to three years post-diagnosis. - Schedule follow-up CT scan in early November 2025 - Order lab tests one week before the next office visit - Continue regular follow-up visits every four months  Chemotherapy-induced peripheral  neuropathy Peripheral neuropathy primarily affecting feet and ankles, likely a residual effect from chemotherapy. Symptoms are manageable and not significantly impacting daily activities. No falls or significant balance issues reported.  Mild anemia Mild anemia consistent with previous findings. Blood counts are stable with platelet counts improving and white count normal. No immediate concerns from anemia. - Review tumor marker results once available and contact if abnormal   Plan - He is clinically doing well, lab reviewed, exam unremarkable - Continue cancer surveillance.  Follow-up in 1 months with lab and CT 1 week before  SUMMARY OF ONCOLOGIC HISTORY: Oncology History Overview Note   Cancer Staging  Primary adenocarcinoma of ascending colon [facility]) Staging form: Colon and Rectum, AJCC 8th Edition - Pathologic stage from 09/14/2021: Stage IIIB (pT3, pN1c, cM0) - Signed by Burton, Lacie K, NP on 10/08/2021 Stage prefix: Initial diagnosis Total positive nodes: 0 Histologic grading system: 4 grade system Histologic grade (G): G3 Laterality: Right Lymph-vascular invasion (LVI): LVI not present (absent)/not identified Tumor deposits (TD): Present Perineural invasion (PNI): Absent Microsatellite instability (MSI): Unstable high BRAF mutation: Positive     Ascending colon cancer s/p lap colectomy 09/14/2021  07/23/2021 Procedure   Upper GI Endoscopy/Colonoscopy; Dr. Federico  Colonoscopy Impression: - Likely malignant partially obstructing tumor in the ascending colon. Biopsied. - Six 3 to 8 mm polyps in the rectum and in the transverse colon, removed with a cold snare. Resected and retrieved. - Non-bleeding internal hemorrhoids.  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 Biopsied. - Multiple gastric polyps. Resected and retrieved. - Erythematous mucosa in the antrum. Biopsied. - Mucosal  nodule found in the duodenum.  Biopsied. - Dilated lacteals were found in the duodenum. Biopsied. - Two non-bleeding angioectasias in the duodenum.   07/23/2021 Initial Biopsy   Diagnosis 1. Duodenum, Biopsy - BENIGN DUODENAL MUCOSA - NO ACUTE INFLAMMATION, VILLOUS BLUNTING OR INCREASED INTRAEPITHELIAL LYMPHOCYTES IDENTIFIED 2. Duodenum, Biopsy, Nodule - PEPTIC DUODENITIS - NO DYSPLASIA OR MALIGNANCY IDENTIFIED 3. Stomach, biopsy - MILD CHRONIC GASTRITIS WITHOUT ACTIVITY - NO INTESTINAL METAPLASIA IDENTIFIED - SEE COMMENT 4. Stomach, polyp(s) - HYPERPLASTIC GASTRIC POLYP(S) WITH INTESTINAL METAPLASIA - NO H. PYLORI OR MALIGNANCY IDENTIFIED 5. Esophagogastric junction, biopsy - SQUAMOCOLUMNAR JUNCTION WITH CHRONIC INFLAMMATION - NO INTESTINAL METAPLASIA, DYSPLASIA OR MALIGNANCY IDENTIFIED 6. Rectum, biopsy, and transverse - MULTIPLE FRAGMENTS OF HYPERPLASTIC POLYP(S) - NO HIGH-GRADE DYSPLASIA OR MALIGNANCY IDENTIFIED 7. Ascending Colon Biopsy, Mass - POORLY DIFFERENTIATED ADENOCARCINOMA - SEE COMMENT  Microscopic Comment 7. By immunohistochemistry, the neoplastic cells are positive for CDX2 (patchy, weak) but negative for p63, p40, CD56, chromogranin and synaptophysin. This immunoprofile is consistent with a poorly differentiated adenocarcinoma.  3. H. pylori immunohistochemistry is POSITIVE for RARE microorganisms.   07/30/2021 Imaging   EXAM: CT CHEST, ABDOMEN, AND PELVIS WITH CONTRAST  IMPRESSION: 1. Masslike region in the ascending colon extending to the level of the hepatic flexure in the right lower quadrant, concerning for malignancy. Associated pericolonic fat stranding and a few prominent lymph nodes are seen adjacent to this segment of colon, suspicious for local infiltration. No distant metastasis is seen. 2. Left inguinal hernia containing nonobstructed colon. 3. Nonspecific small ground-glass opacity in the in the right lower lobe. Attention on follow-up is recommended. 4. Small hiatal  hernia. 5. Aortic atherosclerosis and coronary artery calcifications   08/06/2021 Initial Diagnosis   Malignant neoplasm of ascending colon [facility])   Primary adenocarcinoma of ascending colon (HCC)  07/23/2021 Procedure   Colonoscopy by Dr. Federico - A frond-like/villous, fungating and ulcerated partially obstructing large mass was found in the ascending colon. There was a point at which the mass could not be bypassed, even with use of an ultraslim colonoscope. It is suspected that this mass encompasses the entire cecum and ascending colon. The mass was circumferential. The mass measured fifteen cm in length. Oozing was present.    07/23/2021 Initial Biopsy   3. H. pylori immunohistochemistry is POSITIVE for RARE microorganisms.  FINAL DIAGNOSIS Diagnosis 1. Duodenum, Biopsy - BENIGN DUODENAL MUCOSA - NO ACUTE INFLAMMATION, VILLOUS BLUNTING OR INCREASED INTRAEPITHELIAL LYMPHOCYTES IDENTIFIED 2. Duodenum, Biopsy, Nodule - PEPTIC DUODENITIS - NO DYSPLASIA OR MALIGNANCY IDENTIFIED 3. Stomach, biopsy - MILD CHRONIC GASTRITIS WITHOUT ACTIVITY - NO INTESTINAL METAPLASIA IDENTIFIED - SEE COMMENT 4. Stomach, polyp(s) - HYPERPLASTIC GASTRIC POLYP(S) WITH INTESTINAL METAPLASIA - NO H. PYLORI OR MALIGNANCY IDENTIFIED 5. Esophagogastric junction, biopsy - SQUAMOCOLUMNAR JUNCTION WITH CHRONIC INFLAMMATION - NO INTESTINAL METAPLASIA, DYSPLASIA OR MALIGNANCY IDENTIFIED 6. Rectum, biopsy, and transverse - MULTIPLE FRAGMENTS OF HYPERPLASTIC POLYP(S) - NO HIGH-GRADE DYSPLASIA OR MALIGNANCY IDENTIFIED 7. Ascending Colon Biopsy, Mass - POORLY DIFFERENTIATED ADENOCARCINOMA - SEE COMMENT   07/30/2021 Imaging   CT CAP IMPRESSION: 1. Masslike region in the ascending colon extending to the level of the hepatic flexure in the right lower quadrant, concerning for malignancy. Associated pericolonic fat stranding and a few prominent lymph nodes are seen adjacent to this segment of colon, suspicious for local  infiltration. No distant metastasis is seen. 2. Left inguinal hernia containing nonobstructed colon. 3. Nonspecific small ground-glass opacity in the in the right lower lobe. Attention on follow-up is recommended. 4.  Small hiatal hernia. 5. Aortic atherosclerosis and coronary artery calcifications   09/14/2021 Surgery   PROCEDURE:  Laparoscopic converted to open right hemicolectomy Repair of small bowel serosa x 2     09/14/2021 Pathology Results   A. COLON, RIGHT, HEMI COLECTOMY:  Poorly differentiated adenocarcinoma in the ascending colon with clear margins of resection.  Tumor Size: Circumferential measuring 9.0 cm X 8.0 cm. X 2.5 cm.  Macroscopic Tumor Perforation: Not identified  Histologic Type: Adenocarcinoma.  Histologic Grade: Grade 3, poorly differentiated.  Multiple Primary Sites: Not applicable  Tumor Extension: Extends through the muscular wall into the pericolonic adipose tissue.  Lymphovascular Invasion: Not identified.  Perineural Invasion: Not identified.  Treatment Effect: No known presurgical therapy.  Margins:       Margin Status for Invasive Carcinoma: All margins are negative for invasive carcinoma       Distance from Invasive Carcinoma to proximal margin: 11 cm.       Distance from invasive carcinoma to distal margin: 12 cm.  Regional Lymph Nodes:       Number of Lymph Nodes with Tumor: None.       Number of Lymph Nodes Examined: Twenty-two (22)  Tumor Deposits: Present (slide A11)  Distant Metastasis:       Distant Site(s) Involved: None known.  Pathologic Stage Classification (pTNM, AJCC 8th Edition): pT3 pN1c  Comments: Omentum is free of neoplastic invasion.                      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LAMN)    09/14/2021 Cancer Staging   Staging form: Colon and Rectum, AJCC 8th Edition - Pathologic stage from 09/14/2021: Stage IIIB (pT3, pN1c, cM0) - Signed by Ann Mayme POUR, NP on 10/08/2021 Stage prefix: Initial diagnosis Total  positive nodes: 0 Histologic grading system: 4 grade system Histologic grade (G): G3 Laterality: Right Lymph-vascular invasion (LVI): LVI not present (absent)/not identified Tumor deposits (TD): Present Perineural invasion (PNI): Absent Microsatellite instability (MSI): Unstable high BRAF mutation: Positive   10/08/2021 Initial Diagnosis   Primary adenocarcinoma of ascending colon (HCC)   10/22/2021 - 11/07/2021 Chemotherapy   Patient is on Treatment Plan : COLORECTAL FOLFOX q14d x 3 months     10/22/2021 -  Chemotherapy   Patient is on Treatment Plan : COLORECTAL FOLFOX q14d x 3 months     11/23/2022 Imaging   CT CHEST ABDOMEN PELVIS W CONTRAST   IMPRESSION: 1. Status post right hemicolectomy and reanastomosis. No evidence of recurrent or metastatic disease in the chest, abdomen, or pelvis. 2. Diffuse, long segment wall thickening and mucosal hyperenhancement involving the remnant terminal ileum and colon, particularly of the remnant ascending and transverse colon. Inflammatory findings are increased compared to prior examination, particularly those involving the colon. Although findings consistent with nonspecific infectious, inflammatory, or ischemic enterocolitis, appearance and chronicity is in general highly suggestive of Crohn's ileocolitis. 3. Small right pleural effusion, diminished in volume, with resolution of a previously seen small left pleural effusion. 4. Coronary artery disease.      Discussed the use of AI scribe software for clinical note transcription with the patient, who gave verbal consent to proceed.  History of Present Illness Zabian D Merwin is an 89 year old male with colon cancer who presents for follow-up.  Diagnosed with colon cancer in June 2023, he has undergone chemotherapy and had a port removed in March. He reports manageable neuropathy in his feet and ankles. His balance is slightly impaired, but  he has not fallen, and daily activities remain  unaffected. He has gained seven pounds since December.  Current medications include Synthroid , B12, lisinopril , levothyroxine , and a multivitamin, all taken as prescribed. Blood tests indicate very mild anemia with improved platelet counts nearing normal. White blood cell count, kidney, and liver functions are normal.     All other systems were reviewed with the patient and are negative.  MEDICAL HISTORY:  Past Medical History:  Diagnosis Date   Acute cholecystitis 02/19/2018   Anemia    Arthritis    Chronic kidney disease    Colon cancer (HCC)    GERD (gastroesophageal reflux disease)    History of blood transfusion 08/2021   History of hiatal hernia    Hypertension    Hypothyroidism    Iron  deficiency anemia    Peripheral neuropathy    bilateral feet   Pre-diabetes     SURGICAL HISTORY: Past Surgical History:  Procedure Laterality Date   CATARACT EXTRACTION Bilateral    CHOLECYSTECTOMY N/A 02/20/2018   Procedure: LAPAROSCOPIC CHOLECYSTECTOMY;  Surgeon: Mikell Katz, MD;  Location: [facility];  Service: General;  Laterality: N/A;   COLONOSCOPY  2023   LAPAROSCOPIC RIGHT HEMI COLECTOMY Right 09/14/2021   Procedure: LAPAROSCOPIC TO OPEN RIGHT HEMI COLECTOMY;  Surgeon: Teresa Lonni HERO, MD;  Location: [facility];  Service: General;  Laterality: Right;   PORT-A-CATH REMOVAL N/A 06/09/2023   Procedure: REMOVAL PORT-A-CATH;  Surgeon: Teresa Lonni HERO, MD;  Location: [facility];  Service: General;  Laterality: N/A;   PORTACATH PLACEMENT N/A 10/15/2021   Procedure: INSERTION PORT-A-CATH;  Surgeon: Teresa Lonni HERO, MD;  Location: [facility];  Service: General;  Laterality: N/A;  with ultrasound and flouroscopic guidance   UPPER GASTROINTESTINAL ENDOSCOPY      I have reviewed the social history and family history with the patient and they are unchanged from previous note.  ALLERGIES:  has no known allergies.  MEDICATIONS:  Current Outpatient Medications  Medication Sig  Dispense Refill   ARTIFICIAL TEAR SOLUTION OP Place 1 drop into both eyes daily as needed (dry eyes).     cyanocobalamin  (,VITAMIN B-12,) 1000 MCG/ML injection Inject 1,000 mcg into the muscle every 30 (thirty) days.   0   levothyroxine  (SYNTHROID ) 75 MCG tablet Take 75 mcg by mouth daily before breakfast.     lisinopril  (PRINIVIL ,ZESTRIL ) 20 MG tablet Take 20 mg by mouth daily.  3   loperamide (IMODIUM) 2 MG capsule Take 2 mg by mouth daily as needed for diarrhea or loose stools.     Prenatal Vit-Fe Fumarate-FA (PRENATAL MULTIVITAMIN) TABS tablet Take 1 tablet by mouth daily at 12 noon.     No current facility-administered medications for this visit.    PHYSICAL EXAMINATION: ECOG PERFORMANCE STATUS: 0 - Asymptomatic  Vitals:   09/29/23 0847  BP: (!) 143/63  Pulse: 62  Resp: 13  Temp: 97.8 F (36.6 C)  SpO2: 100%   Wt Readings from Last 3 Encounters:  09/29/23 98.3 kg (216 lb 11.2 oz)  06/09/23 94.3 kg (208 lb)  06/06/23 94.3 kg (208 lb)     GENERAL:alert, no distress and comfortable SKIN: skin color, texture, turgor are normal, no rashes or significant lesions EYES: normal, Conjunctiva are pink and non-injected, sclera clear NECK: supple, thyroid  normal size, non-tender, without nodularity LYMPH:  no palpable lymphadenopathy in the cervical, axillary  LUNGS: clear to auscultation and percussion with normal breathing effort HEART: regular rate & rhythm and no murmurs and no lower extremity edema ABDOMEN:abdomen  soft, non-tender and normal bowel sounds Musculoskeletal:no cyanosis of digits and no clubbing  NEURO: alert & oriented x 3 with fluent speech, no focal motor/sensory deficits  Physical Exam GENERAL: Normal exam findings  LABORATORY DATA:  I have reviewed the data as listed    Latest Ref Rng & Units 09/29/2023    8:13 AM 06/06/2023   10:03 AM 05/23/2023    9:20 AM  CBC  WBC 4.0 - 10.5 K/uL 4.6  4.4  3.8   Hemoglobin 13.0 - 17.0 g/dL 88.8  88.7  88.7    Hematocrit 39.0 - 52.0 % 32.7  34.9  32.6   Platelets 150 - 400 K/uL 145  131  153         Latest Ref Rng & Units 09/29/2023    8:13 AM 06/06/2023   10:03 AM 05/23/2023    9:20 AM  CMP  Glucose 70 - 99 mg/dL 891  890  883   BUN 8 - 23 mg/dL 31  28  25    Creatinine 0.61 - 1.24 mg/dL 8.44  8.77  8.61   Sodium 135 - 145 mmol/L 141  136  136   Potassium 3.5 - 5.1 mmol/L 4.3  4.3  5.0   Chloride 98 - 111 mmol/L 112  111  109   CO2 22 - 32 mmol/L 24  20  24    Calcium  8.9 - 10.3 mg/dL 9.4  9.2  9.5   Total Protein 6.5 - 8.1 g/dL 7.1   7.1   Total Bilirubin 0.0 - 1.2 mg/dL 0.8   0.7   Alkaline Phos 38 - 126 U/L 107   110   AST 15 - 41 U/L 26   18   ALT 0 - 44 U/L 19   15       RADIOGRAPHIC STUDIES: I have personally reviewed the radiological images as listed and agreed with the findings in the report. No results found.    Orders Placed This Encounter  Procedures   CT CHEST ABDOMEN PELVIS W CONTRAST    Standing Status:   Future    Expected Date:   01/25/2024    Expiration Date:   09/28/2024    If indicated for the ordered procedure, I authorize the administration of contrast media per Radiology protocol:   Yes    Does the patient have a contrast media/X-ray dye allergy?:   No    Preferred imaging location?:   [facility]    If indicated for the ordered procedure, I authorize the administration of oral contrast media per Radiology protocol:   Yes   All questions were answered. The patient knows to call the clinic with any problems, questions or concerns. No barriers to learning was detected. The total time spent in the appointment was 25 minutes, including review of chart and various tests results, discussions about plan of care and coordination of care plan     Onita Mattock, MD 09/29/2023

## 2023-09-30 ENCOUNTER — Other Ambulatory Visit: Payer: Self-pay

## 2023-10-07 DIAGNOSIS — I10 Essential (primary) hypertension: Secondary | ICD-10-CM | POA: Diagnosis not present

## 2023-10-07 DIAGNOSIS — E119 Type 2 diabetes mellitus without complications: Secondary | ICD-10-CM | POA: Diagnosis not present

## 2023-10-07 DIAGNOSIS — E538 Deficiency of other specified B group vitamins: Secondary | ICD-10-CM | POA: Diagnosis not present

## 2023-10-07 DIAGNOSIS — N183 Chronic kidney disease, stage 3 unspecified: Secondary | ICD-10-CM | POA: Diagnosis not present

## 2023-10-07 DIAGNOSIS — Z85038 Personal history of other malignant neoplasm of large intestine: Secondary | ICD-10-CM | POA: Diagnosis not present

## 2023-10-07 DIAGNOSIS — E039 Hypothyroidism, unspecified: Secondary | ICD-10-CM | POA: Diagnosis not present

## 2023-10-07 DIAGNOSIS — D500 Iron deficiency anemia secondary to blood loss (chronic): Secondary | ICD-10-CM | POA: Diagnosis not present

## 2023-10-12 DIAGNOSIS — N183 Chronic kidney disease, stage 3 unspecified: Secondary | ICD-10-CM | POA: Diagnosis not present

## 2023-10-12 DIAGNOSIS — E119 Type 2 diabetes mellitus without complications: Secondary | ICD-10-CM | POA: Diagnosis not present

## 2023-10-12 DIAGNOSIS — I10 Essential (primary) hypertension: Secondary | ICD-10-CM | POA: Diagnosis not present

## 2023-10-17 DIAGNOSIS — E538 Deficiency of other specified B group vitamins: Secondary | ICD-10-CM | POA: Diagnosis not present

## 2023-10-20 DIAGNOSIS — E039 Hypothyroidism, unspecified: Secondary | ICD-10-CM | POA: Diagnosis not present

## 2023-10-20 DIAGNOSIS — Z85038 Personal history of other malignant neoplasm of large intestine: Secondary | ICD-10-CM | POA: Diagnosis not present

## 2023-10-20 DIAGNOSIS — E119 Type 2 diabetes mellitus without complications: Secondary | ICD-10-CM | POA: Diagnosis not present

## 2023-10-20 DIAGNOSIS — I10 Essential (primary) hypertension: Secondary | ICD-10-CM | POA: Diagnosis not present

## 2023-10-20 DIAGNOSIS — N183 Chronic kidney disease, stage 3 unspecified: Secondary | ICD-10-CM | POA: Diagnosis not present

## 2023-11-10 DIAGNOSIS — H40023 Open angle with borderline findings, high risk, bilateral: Secondary | ICD-10-CM | POA: Diagnosis not present

## 2023-11-11 DIAGNOSIS — E119 Type 2 diabetes mellitus without complications: Secondary | ICD-10-CM | POA: Diagnosis not present

## 2023-11-11 DIAGNOSIS — I10 Essential (primary) hypertension: Secondary | ICD-10-CM | POA: Diagnosis not present

## 2023-11-11 DIAGNOSIS — N183 Chronic kidney disease, stage 3 unspecified: Secondary | ICD-10-CM | POA: Diagnosis not present

## 2023-11-17 DIAGNOSIS — E538 Deficiency of other specified B group vitamins: Secondary | ICD-10-CM | POA: Diagnosis not present

## 2023-11-20 DIAGNOSIS — N183 Chronic kidney disease, stage 3 unspecified: Secondary | ICD-10-CM | POA: Diagnosis not present

## 2023-11-20 DIAGNOSIS — E119 Type 2 diabetes mellitus without complications: Secondary | ICD-10-CM | POA: Diagnosis not present

## 2023-11-20 DIAGNOSIS — Z85038 Personal history of other malignant neoplasm of large intestine: Secondary | ICD-10-CM | POA: Diagnosis not present

## 2023-11-20 DIAGNOSIS — I10 Essential (primary) hypertension: Secondary | ICD-10-CM | POA: Diagnosis not present

## 2023-11-20 DIAGNOSIS — E039 Hypothyroidism, unspecified: Secondary | ICD-10-CM | POA: Diagnosis not present

## 2023-12-11 DIAGNOSIS — N183 Chronic kidney disease, stage 3 unspecified: Secondary | ICD-10-CM | POA: Diagnosis not present

## 2023-12-11 DIAGNOSIS — E119 Type 2 diabetes mellitus without complications: Secondary | ICD-10-CM | POA: Diagnosis not present

## 2023-12-11 DIAGNOSIS — I10 Essential (primary) hypertension: Secondary | ICD-10-CM | POA: Diagnosis not present

## 2023-12-19 DIAGNOSIS — E538 Deficiency of other specified B group vitamins: Secondary | ICD-10-CM | POA: Diagnosis not present

## 2023-12-20 DIAGNOSIS — Z85038 Personal history of other malignant neoplasm of large intestine: Secondary | ICD-10-CM | POA: Diagnosis not present

## 2023-12-20 DIAGNOSIS — E119 Type 2 diabetes mellitus without complications: Secondary | ICD-10-CM | POA: Diagnosis not present

## 2023-12-20 DIAGNOSIS — N183 Chronic kidney disease, stage 3 unspecified: Secondary | ICD-10-CM | POA: Diagnosis not present

## 2023-12-20 DIAGNOSIS — I10 Essential (primary) hypertension: Secondary | ICD-10-CM | POA: Diagnosis not present

## 2023-12-20 DIAGNOSIS — E039 Hypothyroidism, unspecified: Secondary | ICD-10-CM | POA: Diagnosis not present

## 2023-12-30 DIAGNOSIS — Z23 Encounter for immunization: Secondary | ICD-10-CM | POA: Diagnosis not present

## 2024-01-10 DIAGNOSIS — E119 Type 2 diabetes mellitus without complications: Secondary | ICD-10-CM | POA: Diagnosis not present

## 2024-01-10 DIAGNOSIS — N183 Chronic kidney disease, stage 3 unspecified: Secondary | ICD-10-CM | POA: Diagnosis not present

## 2024-01-10 DIAGNOSIS — I10 Essential (primary) hypertension: Secondary | ICD-10-CM | POA: Diagnosis not present

## 2024-01-18 DIAGNOSIS — E538 Deficiency of other specified B group vitamins: Secondary | ICD-10-CM | POA: Diagnosis not present

## 2024-01-20 DIAGNOSIS — E119 Type 2 diabetes mellitus without complications: Secondary | ICD-10-CM | POA: Diagnosis not present

## 2024-01-20 DIAGNOSIS — I10 Essential (primary) hypertension: Secondary | ICD-10-CM | POA: Diagnosis not present

## 2024-01-20 DIAGNOSIS — Z85038 Personal history of other malignant neoplasm of large intestine: Secondary | ICD-10-CM | POA: Diagnosis not present

## 2024-01-20 DIAGNOSIS — E039 Hypothyroidism, unspecified: Secondary | ICD-10-CM | POA: Diagnosis not present

## 2024-01-20 DIAGNOSIS — N183 Chronic kidney disease, stage 3 unspecified: Secondary | ICD-10-CM | POA: Diagnosis not present

## 2024-01-25 ENCOUNTER — Ambulatory Visit (HOSPITAL_COMMUNITY)
Admission: RE | Admit: 2024-01-25 | Discharge: 2024-01-25 | Disposition: A | Discharge status: Home / Self Care | Source: Ambulatory Visit | Attending: Hematology | Admitting: Hematology

## 2024-01-25 ENCOUNTER — Inpatient Hospital Stay: Attending: Hematology

## 2024-01-25 DIAGNOSIS — Z85038 Personal history of other malignant neoplasm of large intestine: Secondary | ICD-10-CM | POA: Diagnosis not present

## 2024-01-25 DIAGNOSIS — K4090 Unilateral inguinal hernia, without obstruction or gangrene, not specified as recurrent: Secondary | ICD-10-CM | POA: Insufficient documentation

## 2024-01-25 DIAGNOSIS — J90 Pleural effusion, not elsewhere classified: Secondary | ICD-10-CM | POA: Insufficient documentation

## 2024-01-25 DIAGNOSIS — N183 Chronic kidney disease, stage 3 unspecified: Secondary | ICD-10-CM | POA: Diagnosis not present

## 2024-01-25 DIAGNOSIS — I700 Atherosclerosis of aorta: Secondary | ICD-10-CM | POA: Diagnosis not present

## 2024-01-25 DIAGNOSIS — Z9221 Personal history of antineoplastic chemotherapy: Secondary | ICD-10-CM | POA: Insufficient documentation

## 2024-01-25 DIAGNOSIS — G629 Polyneuropathy, unspecified: Secondary | ICD-10-CM | POA: Insufficient documentation

## 2024-01-25 DIAGNOSIS — C182 Malignant neoplasm of ascending colon: Secondary | ICD-10-CM

## 2024-01-25 DIAGNOSIS — C189 Malignant neoplasm of colon, unspecified: Secondary | ICD-10-CM | POA: Diagnosis not present

## 2024-01-25 DIAGNOSIS — R600 Localized edema: Secondary | ICD-10-CM | POA: Diagnosis not present

## 2024-01-25 DIAGNOSIS — D500 Iron deficiency anemia secondary to blood loss (chronic): Secondary | ICD-10-CM

## 2024-01-25 LAB — CMP (CANCER CENTER ONLY)
ALT: 15 U/L (ref 0–44)
AST: 21 U/L (ref 15–41)
Albumin: 4.1 g/dL (ref 3.5–5.0)
Alkaline Phosphatase: 84 U/L (ref 38–126)
Anion gap: 5 (ref 5–15)
BUN: 22 mg/dL (ref 8–23)
CO2: 27 mmol/L (ref 22–32)
Calcium: 9.7 mg/dL (ref 8.9–10.3)
Chloride: 108 mmol/L (ref 98–111)
Creatinine: 1.55 mg/dL — ABNORMAL HIGH (ref 0.61–1.24)
GFR, Estimated: 42 mL/min — ABNORMAL LOW (ref >60)
Glucose, Bld: 110 mg/dL — ABNORMAL HIGH (ref 70–99)
Potassium: 4.9 mmol/L (ref 3.5–5.1)
Sodium: 140 mmol/L (ref 135–145)
Total Bilirubin: 1.4 mg/dL — ABNORMAL HIGH (ref 0.0–1.2)
Total Protein: 7.2 g/dL (ref 6.5–8.1)

## 2024-01-25 LAB — CBC WITH DIFFERENTIAL (CANCER CENTER ONLY)
Abs Immature Granulocytes: 0.01 K/uL (ref 0.00–0.07)
Basophils Absolute: 0 K/uL (ref 0.0–0.1)
Basophils Relative: 1 %
Eosinophils Absolute: 0.1 K/uL (ref 0.0–0.5)
Eosinophils Relative: 2 %
HCT: 33.5 % — ABNORMAL LOW (ref 39.0–52.0)
Hemoglobin: 11.8 g/dL — ABNORMAL LOW (ref 13.0–17.0)
Immature Granulocytes: 0 %
Lymphocytes Relative: 15 %
Lymphs Abs: 0.5 K/uL — ABNORMAL LOW (ref 0.7–4.0)
MCH: 32.8 pg (ref 26.0–34.0)
MCHC: 35.2 g/dL (ref 30.0–36.0)
MCV: 93.1 fL (ref 80.0–100.0)
Monocytes Absolute: 0.3 K/uL (ref 0.1–1.0)
Monocytes Relative: 10 %
Neutro Abs: 2.5 K/uL (ref 1.7–7.7)
Neutrophils Relative %: 72 %
Platelet Count: 141 K/uL — ABNORMAL LOW (ref 150–400)
RBC: 3.6 MIL/uL — ABNORMAL LOW (ref 4.22–5.81)
RDW: 12.6 % (ref 11.5–15.5)
WBC Count: 3.5 K/uL — ABNORMAL LOW (ref 4.0–10.5)
nRBC: 0 % (ref 0.0–0.2)

## 2024-01-25 LAB — CEA (ACCESS): CEA (CHCC): <1 ng/mL (ref 0.00–5.00)

## 2024-01-25 MED ORDER — IOHEXOL 300 MG/ML  SOLN
60.0000 mL | Freq: Once | INTRAMUSCULAR | Status: AC | PRN
  Administered 2024-01-25: 80 mL via INTRAVENOUS

## 2024-01-25 MED ORDER — IOHEXOL 9 MG/ML PO SOLN
500.0000 mL | ORAL | Status: AC
  Administered 2024-01-25 (×2): 500 mL via ORAL

## 2024-01-25 MED ORDER — SODIUM CHLORIDE (PF) 0.9 % IJ SOLN
INTRAMUSCULAR | Status: AC
Start: 2024-01-25 — End: 2024-01-25
  Filled 2024-01-25: qty 50

## 2024-01-25 MED ORDER — IOHEXOL 9 MG/ML PO SOLN
ORAL | Status: AC
  Filled 2024-01-25: qty 1000

## 2024-02-01 NOTE — Assessment & Plan Note (Signed)
 pT3pN1cM0 stage IIIB, loss of MLH1 and PMS2, BRAF V600E+, MLH1 hypermethylation  -diagnosed 07/2021  -s/p open right colectomy by Dr. Teresa 09/14/21 -he completed 6 cycles adjuvant FOLFOX 10/22/21 - 01/13/22.  -he completed chemoRT with Xeloda  on 03/26/2022 -continue, physical exam and lab test (including CBC, CMP and CEA) every 3 months for the first 2 years, then every 6-12 months, colonoscopy in one year, and surveilliance CT scan every 6-12 month for up to 5 year.  -surveillance CT from 05/24/22 showed NED, new small bilateral pleural effusion, right more than left.  He also has mild lower extremity edema.  Not sure about the etiology of his pleural effusion, will watch it for now, he is asymptomatic. -Continue colon cancer surveillance.  He is due for colonoscopy in June 2024. -Surveillance CT scan from 05/23/2023 and 01/25/2024 showed no evidence of recurrence.  He is clinically doing well and no concerns.

## 2024-02-02 ENCOUNTER — Inpatient Hospital Stay: Admitting: Hematology

## 2024-02-02 VITALS — BP 134/60 | HR 70 | Temp 98.3°F | Resp 17 | Wt 219.1 lb

## 2024-02-02 DIAGNOSIS — Z85038 Personal history of other malignant neoplasm of large intestine: Secondary | ICD-10-CM | POA: Diagnosis not present

## 2024-02-02 DIAGNOSIS — C182 Malignant neoplasm of ascending colon: Secondary | ICD-10-CM | POA: Diagnosis not present

## 2024-02-02 DIAGNOSIS — J90 Pleural effusion, not elsewhere classified: Secondary | ICD-10-CM | POA: Diagnosis not present

## 2024-02-02 DIAGNOSIS — R600 Localized edema: Secondary | ICD-10-CM | POA: Diagnosis not present

## 2024-02-02 DIAGNOSIS — Z9221 Personal history of antineoplastic chemotherapy: Secondary | ICD-10-CM | POA: Diagnosis not present

## 2024-02-02 DIAGNOSIS — K4090 Unilateral inguinal hernia, without obstruction or gangrene, not specified as recurrent: Secondary | ICD-10-CM | POA: Diagnosis not present

## 2024-02-02 DIAGNOSIS — N183 Chronic kidney disease, stage 3 unspecified: Secondary | ICD-10-CM | POA: Diagnosis not present

## 2024-02-02 NOTE — Progress Notes (Signed)
 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 Teresa 09/14/21 -he completed 6 cycles adjuvant FOLFOX 10/22/21 - 01/13/22.  -he completed chemoRT with Xeloda  on 03/26/2022 -continue, physical exam and lab test (including CBC, CMP and CEA) every 3 months for the first 2 years, then every 6-12 months, colonoscopy in one year, and surveilliance CT scan every 6-12 month for up to 5 year.  -surveillance CT from 05/24/22 showed NED, new small bilateral pleural effusion, right more than left.  He also has mild lower extremity edema.  Not sure about the etiology of his pleural effusion, will watch it for now, he is asymptomatic. -Continue colon cancer surveillance.  He is due for colonoscopy in June 2024. -Surveillance CT scan from 05/23/2023 and 01/25/2024 showed no evidence of recurrence.  He is clinically doing well and no concerns.   Assessment & Plan Colon cancer, status post resection, under surveillance Colon cancer diagnosed in May 2023, status post resection. Recent CT scan on November 5th, 2025, shows no signs of recurrence. Tumor marker levels are normal. Kidney and liver function tests are stable. Blood counts are stable with very mild changes not concerning. - Continue surveillance with follow-up every four months. - Will plan for another scan next year, which will be the last scan. - Continue follow-up for a total of five years.  Left inguinal hernia Newly identified left inguinal hernia,  likely containing fat and a small portion of colon. Not currently causing symptoms or discomfort. He prefers to avoid surgery unless necessary. - Monitor hernia for any changes or symptoms. - Consult general surgeon if hernia becomes bothersome.  Chronic kidney disease stage 3 Well-managed chronic kidney disease stage 3. Kidney function tests are stable. - Ensure adequate hydration.  Peripheral neuropathy of feet Peripheral neuropathy in feet, described as tolerable and not causing significant issues. - Continue current management as symptoms are tolerable.  Plan - Patient is clinically doing very well, no concern for cancer recurrence. - Lab and CT scan reviewed, ED - Continue cancer surveillance.  Lab and follow-up in 4 months, plan to repeat a neck scan in 1 year.   SUMMARY OF ONCOLOGIC HISTORY: Oncology History Overview Note   Cancer Staging  Primary adenocarcinoma of ascending colon (HCC) Staging form: Colon and Rectum, AJCC 8th Edition - Pathologic stage from 09/14/2021: Stage IIIB (pT3, pN1c, cM0) - Signed by Burton, Lacie K, NP on 10/08/2021 Stage prefix: Initial diagnosis Total positive nodes: 0 Histologic grading system: 4 grade system Histologic grade (G): G3 Laterality: Right Lymph-vascular invasion (LVI): LVI not present (absent)/not identified Tumor deposits (TD): Present Perineural invasion (PNI): Absent Microsatellite instability (MSI): Unstable high BRAF mutation: Positive     Ascending colon cancer s/p lap colectomy 09/14/2021  07/23/2021 Procedure   Upper GI Endoscopy/Colonoscopy; Dr. Federico  Colonoscopy Impression: - Likely malignant partially obstructing tumor in the ascending colon. Biopsied. - Six 3 to 8 mm polyps in the rectum and in the transverse colon, removed with a cold snare. Resected and retrieved. - Non-bleeding internal  hemorrhoids.  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  Biopsied. - Multiple gastric polyps. Resected and retrieved. - Erythematous mucosa in the antrum. Biopsied. - Mucosal nodule found in the duodenum. Biopsied. - Dilated lacteals were found in the duodenum. Biopsied. - Two non-bleeding angioectasias in the duodenum.   07/23/2021 Initial Biopsy   Diagnosis 1. Duodenum, Biopsy - BENIGN DUODENAL MUCOSA - NO ACUTE INFLAMMATION, VILLOUS BLUNTING OR INCREASED INTRAEPITHELIAL LYMPHOCYTES IDENTIFIED 2. Duodenum, Biopsy, Nodule - PEPTIC DUODENITIS - NO DYSPLASIA OR MALIGNANCY IDENTIFIED 3. Stomach, biopsy - MILD CHRONIC GASTRITIS WITHOUT ACTIVITY - NO INTESTINAL METAPLASIA IDENTIFIED - SEE COMMENT 4. Stomach, polyp(s) - HYPERPLASTIC GASTRIC POLYP(S) WITH INTESTINAL METAPLASIA - NO H. PYLORI OR MALIGNANCY IDENTIFIED 5. Esophagogastric junction, biopsy - SQUAMOCOLUMNAR JUNCTION WITH CHRONIC INFLAMMATION - NO INTESTINAL METAPLASIA, DYSPLASIA OR MALIGNANCY IDENTIFIED 6. Rectum, biopsy, and transverse - MULTIPLE FRAGMENTS OF HYPERPLASTIC POLYP(S) - NO HIGH-GRADE DYSPLASIA OR MALIGNANCY IDENTIFIED 7. Ascending Colon Biopsy, Mass - POORLY DIFFERENTIATED ADENOCARCINOMA - SEE COMMENT  Microscopic Comment 7. By immunohistochemistry, the neoplastic cells are positive for CDX2 (patchy, weak) but negative for p63, p40, CD56, chromogranin and synaptophysin. This immunoprofile is consistent with a poorly differentiated adenocarcinoma.  3. H. pylori immunohistochemistry is POSITIVE for RARE microorganisms.   07/30/2021 Imaging   EXAM: CT CHEST, ABDOMEN, AND PELVIS WITH CONTRAST  IMPRESSION: 1. Masslike region in the ascending colon extending to the level of the hepatic flexure in the right lower quadrant, concerning for malignancy. Associated pericolonic fat stranding and a few prominent lymph nodes are seen adjacent to this segment of colon, suspicious for local infiltration. No distant metastasis is seen. 2. Left inguinal hernia containing  nonobstructed colon. 3. Nonspecific small ground-glass opacity in the in the right lower lobe. Attention on follow-up is recommended. 4. Small hiatal hernia. 5. Aortic atherosclerosis and coronary artery calcifications   08/06/2021 Initial Diagnosis   Malignant neoplasm of ascending colon [facility])   Primary adenocarcinoma of ascending colon (HCC)  07/23/2021 Procedure   Colonoscopy by Dr. Federico - A frond-like/villous, fungating and ulcerated partially obstructing large mass was found in the ascending colon. There was a point at which the mass could not be bypassed, even with use of an ultraslim colonoscope. It is suspected that this mass encompasses the entire cecum and ascending colon. The mass was circumferential. The mass measured fifteen cm in length. Oozing was present.    07/23/2021 Initial Biopsy   3. H. pylori immunohistochemistry is POSITIVE for RARE microorganisms.  FINAL DIAGNOSIS Diagnosis 1. Duodenum, Biopsy - BENIGN DUODENAL MUCOSA - NO ACUTE INFLAMMATION, VILLOUS BLUNTING OR INCREASED INTRAEPITHELIAL LYMPHOCYTES IDENTIFIED 2. Duodenum, Biopsy, Nodule - PEPTIC DUODENITIS - NO DYSPLASIA OR MALIGNANCY IDENTIFIED 3. Stomach, biopsy - MILD CHRONIC GASTRITIS WITHOUT ACTIVITY - NO INTESTINAL METAPLASIA IDENTIFIED - SEE COMMENT 4. Stomach, polyp(s) - HYPERPLASTIC GASTRIC POLYP(S) WITH INTESTINAL METAPLASIA - NO H. PYLORI OR MALIGNANCY IDENTIFIED 5. Esophagogastric junction, biopsy - SQUAMOCOLUMNAR JUNCTION WITH CHRONIC INFLAMMATION - NO INTESTINAL METAPLASIA, DYSPLASIA OR MALIGNANCY IDENTIFIED 6. Rectum, biopsy, and transverse - MULTIPLE FRAGMENTS OF HYPERPLASTIC POLYP(S) - NO HIGH-GRADE DYSPLASIA OR MALIGNANCY IDENTIFIED 7. Ascending Colon Biopsy, Mass - POORLY DIFFERENTIATED ADENOCARCINOMA - SEE COMMENT   07/30/2021 Imaging   CT CAP IMPRESSION: 1. Masslike region in the ascending colon extending to the level of the hepatic flexure in the right lower quadrant,  concerning for malignancy. Associated pericolonic fat stranding and a few prominent lymph nodes are seen adjacent to this segment of colon, suspicious for  local infiltration. No distant metastasis is seen. 2. Left inguinal hernia containing nonobstructed colon. 3. Nonspecific small ground-glass opacity in the in the right lower lobe. Attention on follow-up is recommended. 4. Small hiatal hernia. 5. Aortic atherosclerosis and coronary artery calcifications   09/14/2021 Surgery   PROCEDURE:  Laparoscopic converted to open right hemicolectomy Repair of small bowel serosa x 2     09/14/2021 Pathology Results   A. COLON, RIGHT, HEMI COLECTOMY:  Poorly differentiated adenocarcinoma in the ascending colon with clear margins of resection.  Tumor Size: Circumferential measuring 9.0 cm X 8.0 cm. X 2.5 cm.  Macroscopic Tumor Perforation: Not identified  Histologic Type: Adenocarcinoma.  Histologic Grade: Grade 3, poorly differentiated.  Multiple Primary Sites: Not applicable  Tumor Extension: Extends through the muscular wall into the pericolonic adipose tissue.  Lymphovascular Invasion: Not identified.  Perineural Invasion: Not identified.  Treatment Effect: No known presurgical therapy.  Margins:       Margin Status for Invasive Carcinoma: All margins are negative for invasive carcinoma       Distance from Invasive Carcinoma to proximal margin: 11 cm.       Distance from invasive carcinoma to distal margin: 12 cm.  Regional Lymph Nodes:       Number of Lymph Nodes with Tumor: None.       Number of Lymph Nodes Examined: Twenty-two (22)  Tumor Deposits: Present (slide A11)  Distant Metastasis:       Distant Site(s) Involved: None known.  Pathologic Stage Classification (pTNM, AJCC 8th Edition): pT3 pN1c  Comments: Omentum is free of neoplastic invasion.                      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LAMN)    09/14/2021 Cancer Staging   Staging form: Colon and Rectum, AJCC 8th  Edition - Pathologic stage from 09/14/2021: Stage IIIB (pT3, pN1c, cM0) - Signed by Ann Mayme POUR, NP on 10/08/2021 Stage prefix: Initial diagnosis Total positive nodes: 0 Histologic grading system: 4 grade system Histologic grade (G): G3 Laterality: Right Lymph-vascular invasion (LVI): LVI not present (absent)/not identified Tumor deposits (TD): Present Perineural invasion (PNI): Absent Microsatellite instability (MSI): Unstable high BRAF mutation: Positive   10/08/2021 Initial Diagnosis   Primary adenocarcinoma of ascending colon (HCC)   10/22/2021 - 11/07/2021 Chemotherapy   Patient is on Treatment Plan : COLORECTAL FOLFOX q14d x 3 months     10/22/2021 -  Chemotherapy   Patient is on Treatment Plan : COLORECTAL FOLFOX q14d x 3 months     11/23/2022 Imaging   CT CHEST ABDOMEN PELVIS W CONTRAST   IMPRESSION: 1. Status post right hemicolectomy and reanastomosis. No evidence of recurrent or metastatic disease in the chest, abdomen, or pelvis. 2. Diffuse, long segment wall thickening and mucosal hyperenhancement involving the remnant terminal ileum and colon, particularly of the remnant ascending and transverse colon. Inflammatory findings are increased compared to prior examination, particularly those involving the colon. Although findings consistent with nonspecific infectious, inflammatory, or ischemic enterocolitis, appearance and chronicity is in general highly suggestive of Crohn's ileocolitis. 3. Small right pleural effusion, diminished in volume, with resolution of a previously seen small left pleural effusion. 4. Coronary artery disease.      Discussed the use of AI scribe software for clinical note transcription with the patient, who gave verbal consent to proceed.  History of Present Illness Joseph Pennington is a 89 year old male with colon cancer who presents for follow-up.  He  experiences neuropathy in his feet, which is tolerable, with no numbness or tingling in  his hands. Energy levels are decreased compared to the past but remain manageable, allowing him to perform all activities of daily living independently.  He reports no abdominal pain, and bowel movements are regular. A colonoscopy in July 2024 and a CT scan on January 25, 2024, showed no signs of cancer recurrence. He was diagnosed with colon cancer in May 2023, and his port has been removed.  He feels a slight sensation in his groin area, which is not bothersome. He has chronic kidney disease, stage three. He takes medication for blood pressure, thyroid  (levothyroxine ), B12, and a multivitamin, with no need for refills at this time.     All other systems were reviewed with the patient and are negative.  MEDICAL HISTORY:  Past Medical History:  Diagnosis Date   Acute cholecystitis 02/19/2018   Anemia    Arthritis    Chronic kidney disease    Colon cancer (HCC)    GERD (gastroesophageal reflux disease)    History of blood transfusion 08/2021   History of hiatal hernia    Hypertension    Hypothyroidism    Iron  deficiency anemia    Peripheral neuropathy    bilateral feet   Pre-diabetes     SURGICAL HISTORY: Past Surgical History:  Procedure Laterality Date   CATARACT EXTRACTION Bilateral    CHOLECYSTECTOMY N/A 02/20/2018   Procedure: LAPAROSCOPIC CHOLECYSTECTOMY;  Surgeon: Mikell Katz, MD;  Location: [facility];  Service: General;  Laterality: N/A;   COLONOSCOPY  2023   LAPAROSCOPIC RIGHT HEMI COLECTOMY Right 09/14/2021   Procedure: LAPAROSCOPIC TO OPEN RIGHT HEMI COLECTOMY;  Surgeon: Teresa Lonni HERO, MD;  Location: [facility];  Service: General;  Laterality: Right;   PORT-A-CATH REMOVAL N/A 06/09/2023   Procedure: REMOVAL PORT-A-CATH;  Surgeon: Teresa Lonni HERO, MD;  Location: [facility];  Service: General;  Laterality: N/A;   PORTACATH PLACEMENT N/A 10/15/2021   Procedure: INSERTION PORT-A-CATH;  Surgeon: Teresa Lonni HERO, MD;  Location: [facility];  Service: General;   Laterality: N/A;  with ultrasound and flouroscopic guidance   UPPER GASTROINTESTINAL ENDOSCOPY      I have reviewed the social history and family history with the patient and they are unchanged from previous note.  ALLERGIES:  has no known allergies.  MEDICATIONS:  Current Outpatient Medications  Medication Sig Dispense Refill   ARTIFICIAL TEAR SOLUTION OP Place 1 drop into both eyes daily as needed (dry eyes).     cyanocobalamin  (,VITAMIN B-12,) 1000 MCG/ML injection Inject 1,000 mcg into the muscle every 30 (thirty) days.   0   levothyroxine  (SYNTHROID ) 75 MCG tablet Take 75 mcg by mouth daily before breakfast.     lisinopril  (PRINIVIL ,ZESTRIL ) 20 MG tablet Take 20 mg by mouth daily.  3   loperamide (IMODIUM) 2 MG capsule Take 2 mg by mouth daily as needed for diarrhea or loose stools.     Prenatal Vit-Fe Fumarate-FA (PRENATAL MULTIVITAMIN) TABS tablet Take 1 tablet by mouth daily at 12 noon.     No current facility-administered medications for this visit.    PHYSICAL EXAMINATION: ECOG PERFORMANCE STATUS: 1 - Symptomatic but completely ambulatory  Vitals:   02/02/24 0807  BP: 134/60  Pulse: 70  Resp: 17  Temp: 98.3 F (36.8 C)  SpO2: 99%   Wt Readings from Last 3 Encounters:  02/02/24 219 lb 1.6 oz (99.4 kg)  09/29/23 216 lb 11.2 oz (98.3 kg)  06/09/23 208 lb (  94.3 kg)     GENERAL:alert, no distress and comfortable SKIN: skin color, texture, turgor are normal, no rashes or significant lesions EYES: normal, Conjunctiva are pink and non-injected, sclera clear NECK: supple, thyroid  normal size, non-tender, without nodularity LYMPH:  no palpable lymphadenopathy in the cervical, axillary  LUNGS: clear to auscultation and percussion with normal breathing effort HEART: regular rate & rhythm and no murmurs and no lower extremity edema ABDOMEN:abdomen soft, non-tender and normal bowel sounds Musculoskeletal:no cyanosis of digits and no clubbing  NEURO: alert & oriented x 3  with fluent speech, no focal motor/sensory deficits  Physical Exam    LABORATORY DATA:  I have reviewed the data as listed    Latest Ref Rng & Units 01/25/2024    8:45 AM 09/29/2023    8:13 AM 06/06/2023   10:03 AM  CBC  WBC 4.0 - 10.5 K/uL 3.5  4.6  4.4   Hemoglobin 13.0 - 17.0 g/dL 88.1  88.8  88.7   Hematocrit 39.0 - 52.0 % 33.5  32.7  34.9   Platelets 150 - 400 K/uL 141  145  131         Latest Ref Rng & Units 01/25/2024    8:45 AM 09/29/2023    8:13 AM 06/06/2023   10:03 AM  CMP  Glucose 70 - 99 mg/dL 889  891  890   BUN 8 - 23 mg/dL 22  31  28    Creatinine 0.61 - 1.24 mg/dL 8.44  8.44  8.77   Sodium 135 - 145 mmol/L 140  141  136   Potassium 3.5 - 5.1 mmol/L 4.9  4.3  4.3   Chloride 98 - 111 mmol/L 108  112  111   CO2 22 - 32 mmol/L 27  24  20    Calcium  8.9 - 10.3 mg/dL 9.7  9.4  9.2   Total Protein 6.5 - 8.1 g/dL 7.2  7.1    Total Bilirubin 0.0 - 1.2 mg/dL 1.4  0.8    Alkaline Phos 38 - 126 U/L 84  107    AST 15 - 41 U/L 21  26    ALT 0 - 44 U/L 15  19        RADIOGRAPHIC STUDIES: I have personally reviewed the radiological images as listed and agreed with the findings in the report. No results found.    No orders of the defined types were placed in this encounter.  All questions were answered. The patient knows to call the clinic with any problems, questions or concerns. No barriers to learning was detected. The total time spent in the appointment was 25 minutes, including review of chart and various tests results, discussions about plan of care and coordination of care plan     Onita Mattock, MD 02/02/2024

## 2024-02-03 ENCOUNTER — Other Ambulatory Visit: Payer: Self-pay

## 2024-02-09 DIAGNOSIS — E119 Type 2 diabetes mellitus without complications: Secondary | ICD-10-CM | POA: Diagnosis not present

## 2024-02-09 DIAGNOSIS — I10 Essential (primary) hypertension: Secondary | ICD-10-CM | POA: Diagnosis not present

## 2024-02-09 DIAGNOSIS — N183 Chronic kidney disease, stage 3 unspecified: Secondary | ICD-10-CM | POA: Diagnosis not present

## 2024-02-15 DIAGNOSIS — E538 Deficiency of other specified B group vitamins: Secondary | ICD-10-CM | POA: Diagnosis not present

## 2024-02-19 ENCOUNTER — Other Ambulatory Visit: Payer: Self-pay

## 2024-02-19 DIAGNOSIS — N183 Chronic kidney disease, stage 3 unspecified: Secondary | ICD-10-CM | POA: Diagnosis not present

## 2024-02-19 DIAGNOSIS — E039 Hypothyroidism, unspecified: Secondary | ICD-10-CM | POA: Diagnosis not present

## 2024-02-19 DIAGNOSIS — E119 Type 2 diabetes mellitus without complications: Secondary | ICD-10-CM | POA: Diagnosis not present

## 2024-02-19 DIAGNOSIS — I10 Essential (primary) hypertension: Secondary | ICD-10-CM | POA: Diagnosis not present

## 2024-02-19 DIAGNOSIS — Z85038 Personal history of other malignant neoplasm of large intestine: Secondary | ICD-10-CM | POA: Diagnosis not present

## 2024-03-12 ENCOUNTER — Other Ambulatory Visit: Payer: Self-pay | Admitting: Hematology

## 2024-06-04 ENCOUNTER — Inpatient Hospital Stay

## 2024-06-04 ENCOUNTER — Inpatient Hospital Stay: Admitting: Hematology
